# Patient Record
Sex: Female | Born: 1950 | Race: White | Hispanic: No | Marital: Married | Smoking: Former smoker
Health system: Southern US, Community
[De-identification: ages and names within clinical notes are randomized; demographics above are authoritative.]

## PROBLEM LIST (undated history)

## (undated) DIAGNOSIS — K5792 Diverticulitis of intestine, part unspecified, without perforation or abscess without bleeding: Secondary | ICD-10-CM

## (undated) DIAGNOSIS — I499 Cardiac arrhythmia, unspecified: Secondary | ICD-10-CM

## (undated) DIAGNOSIS — E785 Hyperlipidemia, unspecified: Secondary | ICD-10-CM

## (undated) DIAGNOSIS — K219 Gastro-esophageal reflux disease without esophagitis: Secondary | ICD-10-CM

## (undated) DIAGNOSIS — R161 Splenomegaly, not elsewhere classified: Secondary | ICD-10-CM

## (undated) DIAGNOSIS — I4891 Unspecified atrial fibrillation: Secondary | ICD-10-CM

## (undated) DIAGNOSIS — Z8719 Personal history of other diseases of the digestive system: Secondary | ICD-10-CM

## (undated) DIAGNOSIS — J45909 Unspecified asthma, uncomplicated: Secondary | ICD-10-CM

## (undated) DIAGNOSIS — M1990 Unspecified osteoarthritis, unspecified site: Secondary | ICD-10-CM

## (undated) DIAGNOSIS — C9110 Chronic lymphocytic leukemia of B-cell type not having achieved remission: Secondary | ICD-10-CM

## (undated) DIAGNOSIS — I10 Essential (primary) hypertension: Secondary | ICD-10-CM

## (undated) DIAGNOSIS — I480 Paroxysmal atrial fibrillation: Secondary | ICD-10-CM

## (undated) DIAGNOSIS — D649 Anemia, unspecified: Secondary | ICD-10-CM

## (undated) HISTORY — PX: ANKLE SURGERY: SHX546

## (undated) HISTORY — PX: CHOLECYSTECTOMY: SHX55

## (undated) HISTORY — PX: TONSILLECTOMY: SUR1361

## (undated) HISTORY — DX: Splenomegaly, not elsewhere classified: R16.1

## (undated) HISTORY — DX: Hyperlipidemia, unspecified: E78.5

## (undated) HISTORY — DX: Unspecified atrial fibrillation: I48.91

## (undated) HISTORY — DX: Paroxysmal atrial fibrillation: I48.0

## (undated) HISTORY — PX: ROTATOR CUFF REPAIR: SHX139

---

## 2006-10-28 ENCOUNTER — Encounter: Payer: Self-pay | Admitting: Orthopedic Surgery

## 2006-10-29 ENCOUNTER — Encounter: Payer: Self-pay | Admitting: Orthopedic Surgery

## 2006-11-28 ENCOUNTER — Encounter: Payer: Self-pay | Admitting: Orthopedic Surgery

## 2006-12-09 ENCOUNTER — Ambulatory Visit: Payer: Self-pay | Admitting: Pain Medicine

## 2006-12-17 ENCOUNTER — Ambulatory Visit: Payer: Self-pay | Admitting: Pain Medicine

## 2009-07-30 HISTORY — PX: HERNIA REPAIR: SHX51

## 2009-10-07 ENCOUNTER — Inpatient Hospital Stay (HOSPITAL_COMMUNITY): Admission: EM | Admit: 2009-10-07 | Discharge: 2009-10-11 | Payer: Self-pay | Admitting: Emergency Medicine

## 2009-10-07 ENCOUNTER — Ambulatory Visit: Payer: Self-pay | Admitting: Cardiology

## 2009-10-09 ENCOUNTER — Encounter (INDEPENDENT_AMBULATORY_CARE_PROVIDER_SITE_OTHER): Payer: Self-pay | Admitting: *Deleted

## 2009-10-10 ENCOUNTER — Encounter (INDEPENDENT_AMBULATORY_CARE_PROVIDER_SITE_OTHER): Payer: Self-pay | Admitting: *Deleted

## 2009-10-10 ENCOUNTER — Encounter (INDEPENDENT_AMBULATORY_CARE_PROVIDER_SITE_OTHER): Payer: Self-pay | Admitting: Internal Medicine

## 2009-10-10 LAB — CONVERTED CEMR LAB
AST: 21 units/L
Alkaline Phosphatase: 91 units/L
BUN: 10 mg/dL
Calcium: 10.2 mg/dL
Chloride: 112 meq/L
Creatinine, Ser: 0.8 mg/dL
GFR calc non Af Amer: >60 mL/min
Glucose, Bld: 87 mg/dL
Potassium: 4.3 meq/L

## 2009-10-11 ENCOUNTER — Encounter (INDEPENDENT_AMBULATORY_CARE_PROVIDER_SITE_OTHER): Payer: Self-pay | Admitting: *Deleted

## 2009-10-11 LAB — CONVERTED CEMR LAB
HCT: 40.5 %
MCV: 92.1 fL

## 2009-10-17 ENCOUNTER — Ambulatory Visit: Payer: Self-pay | Admitting: Cardiology

## 2009-10-17 ENCOUNTER — Telehealth (INDEPENDENT_AMBULATORY_CARE_PROVIDER_SITE_OTHER): Payer: Self-pay | Admitting: *Deleted

## 2009-10-20 ENCOUNTER — Ambulatory Visit: Payer: Self-pay | Admitting: Cardiology

## 2009-10-20 ENCOUNTER — Telehealth (INDEPENDENT_AMBULATORY_CARE_PROVIDER_SITE_OTHER): Payer: Self-pay | Admitting: *Deleted

## 2009-10-27 ENCOUNTER — Ambulatory Visit: Payer: Self-pay | Admitting: Cardiology

## 2009-10-27 LAB — CONVERTED CEMR LAB: POC INR: 2.2

## 2009-10-31 ENCOUNTER — Encounter: Payer: Self-pay | Admitting: Cardiology

## 2009-11-01 DIAGNOSIS — I10 Essential (primary) hypertension: Secondary | ICD-10-CM | POA: Insufficient documentation

## 2009-11-01 DIAGNOSIS — D7289 Other specified disorders of white blood cells: Secondary | ICD-10-CM | POA: Insufficient documentation

## 2009-11-02 ENCOUNTER — Ambulatory Visit: Payer: Self-pay | Admitting: Cardiology

## 2009-11-02 ENCOUNTER — Encounter (INDEPENDENT_AMBULATORY_CARE_PROVIDER_SITE_OTHER): Payer: Self-pay | Admitting: *Deleted

## 2009-11-09 ENCOUNTER — Ambulatory Visit: Payer: Self-pay | Admitting: Cardiology

## 2009-11-09 ENCOUNTER — Encounter (INDEPENDENT_AMBULATORY_CARE_PROVIDER_SITE_OTHER): Payer: Self-pay | Admitting: *Deleted

## 2009-11-09 DIAGNOSIS — E785 Hyperlipidemia, unspecified: Secondary | ICD-10-CM | POA: Insufficient documentation

## 2009-11-09 DIAGNOSIS — E669 Obesity, unspecified: Secondary | ICD-10-CM | POA: Insufficient documentation

## 2009-11-11 ENCOUNTER — Telehealth (INDEPENDENT_AMBULATORY_CARE_PROVIDER_SITE_OTHER): Payer: Self-pay | Admitting: *Deleted

## 2009-11-14 ENCOUNTER — Telehealth (INDEPENDENT_AMBULATORY_CARE_PROVIDER_SITE_OTHER): Payer: Self-pay | Admitting: *Deleted

## 2009-11-16 ENCOUNTER — Encounter (INDEPENDENT_AMBULATORY_CARE_PROVIDER_SITE_OTHER): Payer: Self-pay | Admitting: *Deleted

## 2009-11-16 ENCOUNTER — Ambulatory Visit: Payer: Self-pay | Admitting: Cardiology

## 2009-11-16 LAB — CONVERTED CEMR LAB: POC INR: 2.2

## 2009-11-23 ENCOUNTER — Ambulatory Visit: Payer: Self-pay | Admitting: Cardiology

## 2009-11-30 ENCOUNTER — Ambulatory Visit: Payer: Self-pay | Admitting: Cardiology

## 2009-11-30 ENCOUNTER — Encounter (INDEPENDENT_AMBULATORY_CARE_PROVIDER_SITE_OTHER): Payer: Self-pay | Admitting: *Deleted

## 2009-11-30 LAB — CONVERTED CEMR LAB
BUN: 18 mg/dL
BUN: 18 mg/dL (ref 6–23)
CO2: 25 meq/L
CO2: 25 meq/L (ref 19–32)
Calcium: 10.5 mg/dL
Chloride: 108 meq/L
Cholesterol: 219 mg/dL
Cholesterol: 219 mg/dL — ABNORMAL HIGH (ref 0–200)
Creatinine, Ser: 0.78 mg/dL
Glucose, Bld: 80 mg/dL
Glucose, Bld: 80 mg/dL (ref 70–99)
HDL: 56 mg/dL
LDL Cholesterol: 149 mg/dL
LDL Cholesterol: 149 mg/dL — ABNORMAL HIGH (ref 0–99)
POC INR: 2.4
Potassium: 4.4 meq/L
Potassium: 4.4 meq/L (ref 3.5–5.3)
Sodium: 143 meq/L
Sodium: 143 meq/L (ref 135–145)
Total CHOL/HDL Ratio: 3.9
Triglycerides: 69 mg/dL

## 2009-12-05 ENCOUNTER — Encounter (INDEPENDENT_AMBULATORY_CARE_PROVIDER_SITE_OTHER): Payer: Self-pay | Admitting: *Deleted

## 2009-12-08 ENCOUNTER — Ambulatory Visit (HOSPITAL_COMMUNITY): Admission: RE | Admit: 2009-12-08 | Discharge: 2009-12-08 | Payer: Self-pay | Admitting: Cardiology

## 2009-12-08 ENCOUNTER — Ambulatory Visit: Payer: Self-pay | Admitting: Cardiology

## 2009-12-15 ENCOUNTER — Encounter (INDEPENDENT_AMBULATORY_CARE_PROVIDER_SITE_OTHER): Payer: Self-pay | Admitting: *Deleted

## 2009-12-19 ENCOUNTER — Ambulatory Visit: Payer: Self-pay | Admitting: Cardiology

## 2010-01-02 ENCOUNTER — Ambulatory Visit: Payer: Self-pay | Admitting: Cardiology

## 2010-01-02 LAB — CONVERTED CEMR LAB: POC INR: 1.7

## 2010-01-09 ENCOUNTER — Encounter: Payer: Self-pay | Admitting: Cardiology

## 2010-01-11 ENCOUNTER — Ambulatory Visit: Payer: Self-pay | Admitting: Cardiology

## 2010-01-25 ENCOUNTER — Ambulatory Visit: Payer: Self-pay | Admitting: Orthopedic Surgery

## 2010-01-25 ENCOUNTER — Ambulatory Visit: Payer: Self-pay | Admitting: Cardiology

## 2010-01-25 DIAGNOSIS — M7580 Other shoulder lesions, unspecified shoulder: Secondary | ICD-10-CM

## 2010-01-25 DIAGNOSIS — M24139 Other articular cartilage disorders, unspecified wrist: Secondary | ICD-10-CM | POA: Insufficient documentation

## 2010-01-25 DIAGNOSIS — M1710 Unilateral primary osteoarthritis, unspecified knee: Secondary | ICD-10-CM | POA: Insufficient documentation

## 2010-01-26 ENCOUNTER — Encounter: Payer: Self-pay | Admitting: Orthopedic Surgery

## 2010-02-02 ENCOUNTER — Telehealth (INDEPENDENT_AMBULATORY_CARE_PROVIDER_SITE_OTHER): Payer: Self-pay | Admitting: *Deleted

## 2010-02-07 ENCOUNTER — Encounter (INDEPENDENT_AMBULATORY_CARE_PROVIDER_SITE_OTHER): Payer: Self-pay | Admitting: *Deleted

## 2010-02-17 ENCOUNTER — Ambulatory Visit: Payer: Self-pay | Admitting: Cardiology

## 2010-03-15 ENCOUNTER — Encounter: Payer: Self-pay | Admitting: Cardiology

## 2010-05-17 ENCOUNTER — Ambulatory Visit: Payer: Self-pay | Admitting: Orthopedic Surgery

## 2010-05-22 ENCOUNTER — Ambulatory Visit (HOSPITAL_COMMUNITY): Admission: RE | Admit: 2010-05-22 | Discharge: 2010-05-22 | Payer: Self-pay | Admitting: General Surgery

## 2010-08-29 NOTE — Progress Notes (Signed)
Summary: HEART RACING WANTS SEEN TODAY  Phone Note Call from Patient Call back at Home Phone [phone]   Caller: PT Reason for Call: Talk to Nurse Summary of Call: PT HEART IS STILL RACING. SHE HAS APPOINTMENT THIS AM WITH COUMADIN CLINIC BUT FEELS SHE NEEDS TO SEE THE DOCTOR. Initial call taken by: Faythe Ghee,  October 17, 2009 8:35 AM  Follow-up for Phone Call        I talked with pt in detail about atral fib and its s/s, printed handouts and gave to pt at her ccr visit at 10:00am today about afib Follow-up by: Teressa Lower RN,  October 17, 2009 9:42 AM

## 2010-08-29 NOTE — Progress Notes (Signed)
  Phone Note Call from Patient   Caller: Patient Summary of Call: pt have palp, having alot of difficulty at night resting d/t feeling these palp, wanted something to stop her palp or help her rest prior to her appt in 11/09/2009, pt states she is not resting at all at night because of this.  plz advise... Initial call taken by: Teressa Lower RN,  October 20, 2009 2:19 PM  Follow-up for Phone Call        Need RN visit for rhythm strip and vital signs.  [place] Bing, M.D.  Follow-up by: Kathlen Brunswick, MD, [facility],  October 20, 2009 10:07 PM  Additional Follow-up for Phone Call Additional follow up Details #1::        scheduled with ccr 10/27/2009 Additional Follow-up by: Teressa Lower RN,  October 21, 2009 10:48 AM

## 2010-08-29 NOTE — Medication Information (Signed)
Summary: ccr-lr  Anticoagulant Therapy  Managed by: Vashti Hey, RN PCP: Rosine Abe Supervising MD: Dietrich Pates MD, Molly Maduro Indication 1: Atrial Fibrillation Lab Used: [facility] [place] Site: [place] INR POC 1.7  Dietary changes: no    Health status changes: no    Bleeding/hemorrhagic complications: no    Recent/future hospitalizations: no    Any changes in medication regimen? no    Recent/future dental: no  Any missed doses?: no       Is patient compliant with meds? yes       Allergies: No Known Drug Allergies  Anticoagulation Management History:      The patient is taking warfarin and comes in today for a routine follow up visit.  The patient is on warfarin for Atrial Fibrillation .  Negative risk factors for bleeding include an age less than 23 years old, no history of CVA/TIA, no history of GI bleeding, and absence of serious comorbidities.  The bleeding index is 'low risk'.  Positive CHADS2 values include History of HTN.  Negative CHADS2 values include History of CHF, Age > 1 years old, History of Diabetes, and Prior Stroke/CVA/TIA.  The start date was 10/07/2009.  Her last INR was 1.33.  Anticoagulation responsible provider: Dietrich Pates MD, Molly Maduro.  INR POC: 1.7.  Cuvette Lot#: [id number].  Exp: 11/2010.    Anticoagulation Management Assessment/Plan:      The patient's current anticoagulation dose is Warfarin sodium 5 mg tabs: Use as directed by Anticoagulation Clinic, Coumadin 5 mg tabs: Sunday - 2 tabs, Monday - 1.5 tabs, Tuesday - 1.5 tabs, Wednesday - 2 tabs, Thursday - 1.5 tabs, Friday - 1.5 tabs, Saturday - 1.5 tabs.  The target INR is 2.0-3.0.  The next INR is due 01/12/2010.  Anticoagulation instructions were given to patient.  Results were reviewed/authorized by Vashti Hey, RN.  She was notified by Vashti Hey RN.         Prior Anticoagulation Instructions: INR 3.0 Continue coumadin 7.5mg  once daily except 10mg  on Sundays and  Wednesdays Increase greens  Current Anticoagulation Instructions: INR 1.7 Take coumadin 3 tablets tonight, 2 1/2 tablets tomorrow night then resume 1 1/2 tablets once daily except 2 tablets on Sundays and Wednesdays

## 2010-08-29 NOTE — Letter (Signed)
Summary: TEE Instructions [place]  [facility]  [address] [place] [address]   Phone: [phone]  Fax: [phone]      TEE Instructions    You are scheduled for a CARIOVERSION on MAY 12,2011 with Dr. Dietrich Pates.  Please arrive at the [facility] of [facility] at 8:30 a.m.  on the day of your procedure.  1)   Diet:     A)   Nothing to eat or drink after midnight except your medications with        a sip of water.     2)  Must have a responsible person to drive you home.  3)   Bring your current insurance cards and current list of all your medications.   *Special Note:  Every effort is made to have your procedure done on time.  Occasionally there are emergencies that present themselves at the hospital that may cause delays.  Please be patient if a delay does occur.  *If you have any questions after you get home, please call the office at [phone].

## 2010-08-29 NOTE — Miscellaneous (Signed)
Summary: LABS BMP,LIPIDS,11/30/2009  Clinical Lists Changes  Observations: Added new observation of CALCIUM: 10.5 mg/dL (21/30/8657 84:69) Added new observation of CREATININE: 0.78 mg/dL (62/95/2841 32:44) Added new observation of BUN: 18 mg/dL (08/01/7251 66:44) Added new observation of BG RANDOM: 80 mg/dL (03/47/4259 56:38) Added new observation of CO2 PLSM/SER: 25 meq/L (11/30/2009 10:21) Added new observation of CL SERUM: 108 meq/L (11/30/2009 10:21) Added new observation of K SERUM: 4.4 meq/L (11/30/2009 10:21) Added new observation of NA: 143 meq/L (11/30/2009 10:21) Added new observation of LDL: 149 mg/dL (75/64/3329 51:88) Added new observation of HDL: 56 mg/dL (41/66/0630 16:01) Added new observation of TRIGLYC TOT: 69 mg/dL (09/32/3557 32:20) Added new observation of CHOLESTEROL: 219 mg/dL (25/42/7062 37:62)

## 2010-08-29 NOTE — Assessment & Plan Note (Signed)
Summary: EPH   Visit Type:  Follow-up Primary Provider:  Rosine Abe   History of Present Illness: [name] returns to the office following a recent hospital admission for new-onset atrial fibrillation.  She had no definite precipitating event, but did have a sinus infection or other upper respiratory infection at the time of presentation.  Heart rate was controlled and anticoagulation initiated in hospital.  The patient has subsequently noted fatigue and malaise.  Exercise tolerance has been poor.  She has exertional dyspnea.  She occasionally notes palpitations with a sense of tachycardia.  Hospital records were obtained and reviewed.  Her echocardiogram was essentially normal, but there was mild septal hypokinesis.  Thyroid function studies suggested mild hypothyroidism.  Chemistry profile, coagulation studies and CBC were normal.  Current Medications (verified): 1)  Warfarin Sodium 5 Mg Tabs (Warfarin Sodium) .... Use As Directed By Anticoagulation Clinic 2)  Diltiazem Hcl Er Beads 360 Mg Xr24h-Cap (Diltiazem Hcl Er Beads) .... Take One Capsule By Mouth Daily Start4/15/2011 3)  Atenolol 25 Mg Tabs (Atenolol) .... Take One Tablet By Mouth Daily Start 11/11/2009 4)  Calcium-Vitamin D 500-125 Mg-Unit Tabs (Calcium-Vitamin D) .Marland Kitchen.. 1 Tab Daily 5)  Glucosamine 500 Mg Caps (Glucosamine Sulfate) .Marland Kitchen.. 1 Tab By Mouth Once Daily 6)  Salmon Oil  Caps (Nutritional Supplements) .Marland Kitchen.. 1 Tab Daily 7)  Nabumetone 750 Mg Tabs (Nabumetone) .Marland Kitchen.. 1 Tab By Mouth Two Times A Day 8)  Tylenol Extra Strength 500 Mg Tabs (Acetaminophen) .... Take 2 Tabs Every 6 Hrs. Prn 9)  Evening Primrose Oil 500 Mg Caps (Evening Primrose Oil) .Marland Kitchen.. 1 Tab Nightly  Allergies (verified): No Known Drug Allergies  Past History:  Family History: Last updated: 12-05-2009 Father died at age 66 as a result of a brainstem infarction Mother died at age 30 secondary to ovarian carcinoma Multiple family members  with hypertension.  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  All other systems reviewed and are negative.  Vital Signs:  Patient profile:   60 year old female Height:      66 inches Weight:      249 pounds BMI:     40.33 Pulse rate:   55 / minute BP sitting:   120 / 65  (right arm)  Vitals Entered By: Dreama Saa, CNA (12-05-09 1:26 PM)  EKG  Procedure date:  12-05-09  Findings:      Rhythm Strip  Atrial fibrillation; controlled ventricular response of 78.   Physical Exam  General:    Obese; well developed; no acute distress:   Neck-No JVD; no carotid bruits:mild thyromegaly Lungs-No tachypnea, no rales; no rhonchi; no wheezes: Cardiovascular-normal PMI; normal S1 and S2;  modest systolic murmur Abdomen-BS normal; soft and non-tender without masses or organomegaly:  Musculoskeletal-No deformities, no cyanosis or clubbing: Neurologic-Normal cranial nerves; symmetric strength and tone:  Skin-Warm, no significant lesions: Extremities-Nl  distal pulses; no edema:     Impression & Recommendations:  Problem # 1:  ATRIAL FIBRILLATION (ICD-427.31) Heart rate is well controlled at rest.  After a 1000 feet of walking on the flat, heart rate increased to only 105 beats per minute, indicating good rate control with effort as well.  The etiology of the patient's fatigue is uncertain.  She's not sleeping well, complaining of palpitations at night.  Beta blocker could certainly be causing some adverse effects.  I will increase her dose of diltiazem to 360 mg q.d. and decrease atenolol to 25 mg q.d.  We will plan for DC cardioversion when INR has been therapeutic for one month.  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  Patient's primary care doctor will follow up this problem and her minor thyromegaly.  Problem # 3:  HYPERTENSION (ICD-401.9) Blood pressure control is good on current medicines.  I will see this nice woman again in one month at which time I anticipate scheduling elective cardioversion.  Other Orders: Future Orders: T-Lipid Profile ([id number]) ... 11/14/2009  Patient Instructions: 1)  Your physician recommends that you schedule a follow-up appointment in: 1 month 2)  Your physician has recommended you make the following change in your medication: TOMORROW: 3)  120MG  DILTIAZEM AND NO ATENOLOL 4)  CHANGE DILTAIZEM TO 360MG  DAILY START FRIDAY 11/11/2009 5)  CHANGE ATENOLOL TO 25MG  DAILY START FRIDAY 11/11/2009 6)  COUMADIN CLINIC WEEKLY X 4 WEEKS UNTIL NEXT OFFICE VISIT 7)  Your physician recommends that you return for a FASTING lipid profile: NEXT WEEK Prescriptions: ATENOLOL 25 MG TABS (ATENOLOL) Take one tablet by mouth daily START 11/11/2009  #30 x 6   Entered by:   Teressa Lower RN   Authorized by:   Kathlen Brunswick, MD, [facility]   Signed by:   Teressa Lower RN on 11/09/2009   Method used:   Electronically to        [id number] (retail)       [address]       [place], [place]  [address]       Ph: [phone]       Fax: [phone]   RxID:   [id number] DILTIAZEM HCL ER BEADS 360 MG XR24H-CAP (DILTIAZEM HCL ER BEADS) Take one capsule by mouth daily START4/15/2011  #30 x 6   Entered by:   Teressa Lower RN   Authorized by:   Kathlen Brunswick, MD, [facility]   Signed by:   Teressa Lower RN on 11/09/2009   Method used:   Electronically to        [id number] (retail)       [address]       [place], [place]  [address]       Ph: [phone]       Fax: [phone]   RxID:   [id number]

## 2010-08-29 NOTE — Assessment & Plan Note (Signed)
Summary: rt shoulder pain needs xr/mc dis/bsf   Vital Signs:  Patient profile:   60 year old female Height:      67 inches Weight:      244 pounds Pulse rate:   72 / minute Resp:     16 per minute  Vitals Entered By: Fuller Canada MD (January 25, 2010 9:28 AM)  Visit Type:  new patient Referring Provider:  self Primary Provider:  Rosine Abe  CC:  right shoulder pain.  History of Present Illness: I saw Daisy Black in the office today for an initial visit.  She is a 60 years old woman with the complaint of:  right shoulder pain.  She also has some RIGHT knee pain previously seen at [facility] and also in [place] on the schedule to have partial versus total knee replacement and developed atrial fibrillation, which required treatment with Coumadin. She subsequently has come on the [facility], health system plan  The RIGHT shoulder and started with atraumatic onset of sharp, dull, throbbing pain, which is worse at night, but is constant.  Came on gradually worse with cold air blowing on her shoulder better with heat.  Pain present for one month.  Ibuprofen. No relief.  Pain level IV/X  Xrays today in our office.  Meds: Atenolol, Coumadin 5mg , Celebrex 200mg , Lisinopril, Synthroid, Salmon oil, Calcium, Glucosamine.    Allergies (verified): No Known Drug Allergies  Past History:  Past Medical History: Atrial fibrillation-onset in 09/2009; DC cardioversion in 10/2009 LYMPHOCYTOSIS (ICD-288.8) Obesity HYPERTENSION (ICD-401.9) Hyperlipidemia Elevated TSH hernia  Past Surgical History: Herniorrhaphy Cholecystectomy Rotator cuff repair Right ankle surgery tonsils  Family History: Father died at age 54 as a result of a brainstem infarction Mother died at age 61 secondary to ovarian carcinoma Multiple family members with hypertension. 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 Cardiovascular:  Complains of palpitations; denies chest pain, fainting, and murmurs. Respiratory:  Denies short of breath, wheezing, couch, tightness, pain on inspiration, and snoring . Gastrointestinal:  Denies heartburn, nausea, vomiting, diarrhea, constipation, and blood in your stools. Genitourinary:  Denies frequency, urgency, difficulty urinating, painful urination, flank pain, and bleeding in urine. Neurologic:  Denies numbness, tingling, unsteady gait, dizziness, tremors, and seizure. Musculoskeletal:  Complains of joint pain, swelling, and stiffness; denies instability, redness, heat, and muscle pain. Endocrine:  Denies excessive thirst, exessive urination, and heat or cold intolerance. Psychiatric:  Denies nervousness, depression, anxiety, and hallucinations. Skin:  Denies changes in the skin, poor healing, rash, itching, and redness. HEENT:  Denies blurred or double vision, eye pain, redness, and watering. Immunology:  Complains of seasonal allergies; denies sinus problems and allergic to bee stings. Hemoatologic:  Complains of brusing; denies easy bleeding.  Physical Exam  Skin:  intact without lesions or rashes Cervical Nodes:  no significant adenopathy Axillary Nodes:  no significant adenopathy Psych:  alert and cooperative; normal mood and affect; normal attention span and concentration   Shoulder/Elbow Exam  General:    vital signs are as recorded.  Body habitus, endomorphic.  No gross deformities.    Inspection:    normal appearing shoulder  Palpation:    no specific tenderness  maybe a little in the subacromial space posteriorly  Vascular:    radial and  ulnar pulses are normal in both upper extremities.  Color is good temperature, normal.    Sensory:    upper extremities. No sensory deficits  Motor:    rotator cuff strength is normal.    Reflexes:    Normal reflexes in the upper extremities.    Shoulder Exam:    Right:    Inspection/Palpation:  range of motion passively is normal.  She does have a mild impingement sign with Neer maneuver shoulder stable in abduction. External rotation apprehension sign was negative.     Impression & Recommendations:  Problem # 1:  IMPINGEMENT SYNDROME (ICD-726.2) Assessment New  RIGHT shoulder subacromial space injection.  RIGHT shoulder x-rays, AP, lateral, show normal glenohumeral joint, normal acromion, but no bony abnormality.  Assessment most likely impingement syndrome with bursitis. 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  Notes reviewed from [facility] orthopedics and from [facility] confirm that the patient has had cortisone injection and Synvisc injections and was scheduled for knee replacement surgery.   Her updated medication list for this problem includes:    Nabumetone 750 Mg Tabs (Nabumetone) .Marland Kitchen... 1 tab by mouth two times a day    Tramadol Hcl 50 Mg Tabs (Tramadol hcl) .Marland Kitchen... Take 1 tab daily    Tylenol Arthritis Pain 650 Mg Cr-tabs (Acetaminophen) .Marland Kitchen... Take as directed for pain    Tylenol With Codeine #3 300-30 Mg Tabs (Acetaminophen-codeine) ..... One by mouth q 4 hrs as needed pain  Orders: New Patient Level IV (46962) Depo- Medrol 40mg  (J1030) Joint Aspirate / Injection, Intermediate (95284)  Medications Added to Medication List This Visit: 1)  Tylenol  With Codeine #3 300-30 Mg Tabs (Acetaminophen-codeine) .... One by mouth q 4 hrs as needed pain  Patient Instructions: 1)  You have received an injection of cortisone today. You may experience increased pain at the injection site. Apply ice pack to the area for 20 minutes every 2 hours and take 2 xtra strength tylenol every 8 hours. This increased pain will usually resolve in 24 hours. The injection will take effect in 3-10 days.  2)  come back as needed Prescriptions: TYLENOL WITH CODEINE #3 300-30 MG TABS (ACETAMINOPHEN-CODEINE) one by mouth q 4 hrs as needed pain  #60 x 0   Entered and Authorized by:   Fuller Canada MD   Signed by:   Fuller Canada MD on 01/25/2010   Method used:   Historical   RxID:   [id number]

## 2010-08-29 NOTE — Medication Information (Signed)
Summary: ccr-lr  Anticoagulant Therapy  Managed by: Vashti Hey, RN Supervising MD: Dietrich Pates MD, Molly Maduro Indication 1: Atrial Fibrillation Lab Used: [facility] [place] Site: [place] INR POC 2.2  Dietary changes: no    Health status changes: no    Bleeding/hemorrhagic complications: no    Recent/future hospitalizations: no    Any changes in medication regimen? no    Recent/future dental: no  Any missed doses?: no       Is patient compliant with meds? yes       Anticoagulation Management History:      The patient is taking warfarin and comes in today for a routine follow up visit.  The patient is taking warfarin for Atrial Fibrillation .  Negative risk factors for bleeding include an age less than 30 years old, no history of CVA/TIA, no history of GI bleeding, and absence of serious comorbidities.  The bleeding index is 'low risk'.  Positive CHADS2 values include History of HTN.  Negative CHADS2 values include History of CHF, Age > 47 years old, History of Diabetes, and Prior Stroke/CVA/TIA.  The start date was 10/07/2009.  Anticoagulation responsible provider: Dietrich Pates MD, Molly Maduro.  INR POC: 2.2.  Cuvette Lot#: [id number].    Anticoagulation Management Assessment/Plan:      The patient's current anticoagulation dose is Warfarin sodium 5 mg tabs: Use as directed by Anticoagulation Clinic.  The target INR is 2.0-3.0.  The next INR is due 11/02/2009.  Anticoagulation instructions were given to patient.  Results were reviewed/authorized by Vashti Hey, RN.  She was notified by Vashti Hey RN.         Prior Anticoagulation Instructions: INR 3.5 Hold coumadin tonight then decrease dose to 7.5mg  once daily except 5mg  on Mondays, Wednesdays and Fridays  Current Anticoagulation Instructions: INR 2.2 Continue coumadin 7.5mg  once daily except 5mg  on Mondays, Wednesdays and Fridays

## 2010-08-29 NOTE — Medication Information (Signed)
Summary: ccr-lr  Anticoagulant Therapy  Managed by: Vashti Hey, RN PCP: Rosine Abe Supervising MD: Dietrich Pates MD, Molly Maduro Indication 1: Atrial Fibrillation Lab Used: [facility] [place] Site: [place] INR POC 2.4  Dietary changes: no    Health status changes: no    Bleeding/hemorrhagic complications: no    Recent/future hospitalizations: yes       Details: Pending DCCV  Any changes in medication regimen? no    Recent/future dental: no  Any missed doses?: no       Is patient compliant with meds? yes       Allergies: No Known Drug Allergies  Anticoagulation Management History:      The patient is taking warfarin and comes in today for a routine follow up visit.  The patient is taking warfarin for Atrial Fibrillation .  Negative risk factors for bleeding include an age less than 61 years old, no history of CVA/TIA, no history of GI bleeding, and absence of serious comorbidities.  The bleeding index is 'low risk'.  Positive CHADS2 values include History of HTN.  Negative CHADS2 values include History of CHF, Age > 32 years old, History of Diabetes, and Prior Stroke/CVA/TIA.  The start date was 10/07/2009.  Her last INR was 1.33.  Anticoagulation responsible provider: Dietrich Pates MD, Molly Maduro.  INR POC: 2.4.    Anticoagulation Management Assessment/Plan:      The patient's current anticoagulation dose is Warfarin sodium 5 mg tabs: Use as directed by Anticoagulation Clinic.  The target INR is 2.0-3.0.  The next INR is due 11/30/2009.  Anticoagulation instructions were given to patient.  Results were reviewed/authorized by Vashti Hey, RN.  She was notified by Vashti Hey RN.        Coagulation management information includes: Pending DCCV.  Prior Anticoagulation Instructions: INR 2.2 Take coumadin 7.5mg  once daily except 10mg  on Wednesdays and Sundays  Current Anticoagulation Instructions: INR 2.4 Continue coumadin 7.5mg  once daily except 10mg  on Wednesdays  and Sundays

## 2010-08-29 NOTE — Assessment & Plan Note (Signed)
Summary: F1M   Visit Type:  Follow-up Primary Provider:  Rosine Abe   History of Present Illness: [name] returns to the office for continued assessment and treatment of paroxysmal atrial fibrillation.  Since her last visit, she underwent uncomplicated DC cardioversion as an outpatient.  She reports a much improved sense of well-being since that procedure.  She has a ventral hernia and would like to undergo surgical repair as soon as possible.  She previously was seen by an expert from [facility], but her insurance no longer covers that institution.  She is now seeking a local surgeon-I provided her with names of goodl practitioners in the area.  Current Medications (verified): 1)  Warfarin Sodium 5 Mg Tabs (Warfarin Sodium) .... Use As Directed By Anticoagulation Clinic 2)  Atenolol 25 Mg Tabs (Atenolol) .... Take One Tablet By Mouth Daily Start 11/11/2009 3)  Calcium-Vitamin D 500-125 Mg-Unit Tabs (Calcium-Vitamin D) .Marland Kitchen.. 1 Tab Daily 4)  Glucosamine 500 Mg Caps (Glucosamine Sulfate) .Marland Kitchen.. 1 Tab By Mouth Once Daily 5)  Salmon Oil  Caps (Nutritional Supplements) .Marland Kitchen.. 1 Tab Daily 6)  Nabumetone 750 Mg Tabs (Nabumetone) .Marland Kitchen.. 1 Tab By Mouth Two Times A Day 7)  Evening Primrose Oil 500 Mg Caps (Evening Primrose Oil) .Marland Kitchen.. 1 Tab Nightly 8)  Coumadin 5 Mg Tabs (Warfarin Sodium) .... "Sunday - 2 Tabs, Monday - 1.5 Tabs, Tuesday - 1.5 Tabs, Wednesday - 2 Tabs, Thursday - 1.5 Tabs, Friday - 1.5 Tabs, Saturday - 1.5 Tabs 9)  Levothyroxine Sodium 100 Mcg Tabs (Levothyroxine Sodium) .... Take 1 Tab Daily 10)  Tramadol Hcl 50 Mg Tabs (Tramadol Hcl) .... Take 1 Tab Daily 11)  Tylenol Arthritis Pain 650 Mg Cr-Tabs (Acetaminophen) .... Take As Directed For Pain 12)  Lisinopril 20 Mg Tabs (Lisinopril) .... Take 1 Tablet By Mouth Once A Day '  Allergies (verified): No Known Drug Allergies  Past History:  PMH, FH, and Social History reviewed and updated.  Past Medical  History: Atrial fibrillation-onset in 09/2009; DC cardioversion in 10/2009 LYMPHOCYTOSIS (ICD-288.8) Obesity HYPERTENSION (ICD-401.9) Hyperlipidemia Elevated TSH  Review of Systems  The patient denies weight loss, weight gain, chest pain, syncope, dyspnea on exertion, and peripheral edema.    Vital Signs:  Patient profile:   60 year old female Weight:      244 pounds BMI:     39" .52 Pulse rate:   58 / minute BP sitting:   118 / 64  (right arm)  Vitals Entered By: Dreama Saa, CNA (Dec 19, 2009 1:51 PM)  Physical Exam  General:     well developed; no acute distress:   Neck-No JVD; no carotid bruits Lungs-No tachypnea, no rales; no rhonchi; no wheezes: Cardiovascular-normal PMI; normal S1 and S2; modest systolic murmur Abdomen-BS normal; soft and non-tender without masses or organomegaly:  Musculoskeletal-No deformities, no cyanosis or clubbing: Neurologic-Normal cranial nerves; symmetric strength and tone:  Skin-Warm, no significant lesions: Extremities-Nl distal pulses; no edema:     Impression & Recommendations:  Problem # 1:  ATRIAL FIBRILLATION (ICD-427.31) Sinus rhythm and has been maintained during the few weeks since she underwent elective cardioversion.  Since an ACE inhibitor may contribute to maintenance of sinus rhythm, lisinopril will be substituted for diltiazem at a dose of 20 mg q.d.  I am also inclined to stop diltiazem because it has led to a significant first degree AV block on her EKG.  She will resume that medication should she experience recurrent AF.  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 has been started by the patient's primary care physician.  A repeat thyroid panel should be obtained in approximately 6 weeks.  Problem # 3:  HYPERTENSION (ICD-401.9) Blood pressure control is good.  Optimal management of this problem likely reduces the possibility of a recurrent arrhythmia.  With lisinopril  does not adequately reduce pressure, an additional antihypertensive agent will be necessary.  Problem # 4:  ANTICOAGULATION (ICD-V58.61) Risk factors for thromboembolism  include only borderline hypertension.  In the long run the risk of warfarin therapy probably exceeds the benefit.  If sinus rhythm is maintained for the next 2 months, I will be inclined to substitute aspirin for full dose warfarin.  I will see this nice woman again in July.  Patient Instructions: 1)  Your physician recommends that you schedule a follow-up appointment in: 2 months 2)  You have been referred to Dr. Lovell Sheehan for surgical consult 3)  Your physician has recommended you make the following change in your medication: stop diltiazem and begin lisinopril 20mg  daily 4)  if atrial fibrillation restarts diltiazem and stop lisinopril Prescriptions: LISINOPRIL 20 MG TABS (LISINOPRIL) Take 1 tablet by mouth once a day '  #30 x 3   Entered by:   Teressa Lower RN   Authorized by:   Kathlen Brunswick, MD, [facility]   Signed by:   Teressa Lower RN on 12/19/2009   Method used:   Electronically to        [id number] (retail)       [address]       [place], [place]  [address]       Ph: [phone]       Fax: [phone]   RxID:   [id number]

## 2010-08-29 NOTE — Letter (Signed)
Summary: Previous records brought by the patient  Previous records brought by the patient   Imported By: Jacklynn Ganong 01/26/2010 09:37:54  _____________________________________________________________________  External Attachment:    Type:   Image     Comment:   External Document

## 2010-08-29 NOTE — Medication Information (Signed)
Summary: ccr-lr  Anticoagulant Therapy  Managed by: Vashti Hey, RN Supervising MD: Diona Browner MD, Remi Deter Indication 1: Atrial Fibrillation Lab Used: [facility] [place] Site: [place] INR POC 1.6  Dietary changes: no    Health status changes: no    Bleeding/hemorrhagic complications: no    Recent/future hospitalizations: no    Any changes in medication regimen? no    Recent/future dental: no  Any missed doses?: no       Is patient compliant with meds? yes       Allergies: No Known Drug Allergies  Anticoagulation Management History:      The patient is taking warfarin and comes in today for a routine follow up visit.  The patient is taking warfarin for Atrial Fibrillation .  Negative risk factors for bleeding include an age less than 74 years old, no history of CVA/TIA, no history of GI bleeding, and absence of serious comorbidities.  The bleeding index is 'low risk'.  Positive CHADS2 values include History of HTN.  Negative CHADS2 values include History of CHF, Age > 23 years old, History of Diabetes, and Prior Stroke/CVA/TIA.  The start date was 10/07/2009.  Her last INR was 1.33.  Anticoagulation responsible provider: Diona Browner MD, Remi Deter.  INR POC: 1.6.  Cuvette Lot#: [id number].    Anticoagulation Management Assessment/Plan:      The patient's current anticoagulation dose is Warfarin sodium 5 mg tabs: Use as directed by Anticoagulation Clinic.  The target INR is 2.0-3.0.  The next INR is due 11/09/2009.  Anticoagulation instructions were given to patient.  Results were reviewed/authorized by Vashti Hey, RN.  She was notified by Vashti Hey RN.         Prior Anticoagulation Instructions: INR 2.2 Continue coumadin 7.5mg  once daily except 5mg  on Mondays, Wednesdays and Fridays  Current Anticoagulation Instructions: INR 1.6 Take coumadin 2 tablets tonight then increase dose to 1 1/2 tablets once daily

## 2010-08-29 NOTE — Medication Information (Signed)
Summary: ccr  Anticoagulant Therapy  Managed by: Vashti Hey, RN Supervising MD: Diona Browner MD, Remi Deter Indication 1: Atrial Fibrillation Lab Used: [facility] [place] Site: [place]  Dietary changes: no    Health status changes: no    Bleeding/hemorrhagic complications: no    Recent/future hospitalizations: no    Any changes in medication regimen? no    Recent/future dental: no  Any missed doses?: no       Is patient compliant with meds? yes       Anticoagulation Management History:      The patient is taking warfarin and comes in today for a routine follow up visit.  The patient is taking warfarin for Atrial Fibrillation .  Negative risk factors for bleeding include an age less than 11 years old, no history of CVA/TIA, no history of GI bleeding, and absence of serious comorbidities.  The bleeding index is 'low risk'.  Positive CHADS2 values include History of HTN.  Negative CHADS2 values include History of CHF, Age > 72 years old, History of Diabetes, and Prior Stroke/CVA/TIA.  The start date was 10/07/2009.  Anticoagulation responsible provider: Diona Browner MD, Remi Deter.  Cuvette Lot#: [id number].    Anticoagulation Management Assessment/Plan:      The patient's current anticoagulation dose is Warfarin sodium 5 mg tabs: Use as directed by Anticoagulation Clinic.  The target INR is 2.0-3.0.  The next INR is due 10/27/2009.  Anticoagulation instructions were given to patient.  Results were reviewed/authorized by Vashti Hey, RN.  She was notified by Vashti Hey RN.         Prior Anticoagulation Instructions: INR 2.8 Decrease coumadin to 7.5mg  once daily   Current Anticoagulation Instructions: INR 3.5 Hold coumadin tonight then decrease dose to 7.5mg  once daily except 5mg  on Mondays, Wednesdays and Fridays

## 2010-08-29 NOTE — Medication Information (Signed)
Summary: ccr-lr  Anticoagulant Therapy  Managed by: Vashti Hey, RN Supervising MD: Dietrich Pates MD, Molly Maduro Indication 1: Atrial Fibrillation Lab Used: [facility] [place] Site: [place] INR POC 2.2  Dietary changes: no    Health status changes: no    Bleeding/hemorrhagic complications: no    Recent/future hospitalizations: no    Any changes in medication regimen? no    Recent/future dental: no  Any missed doses?: no       Is patient compliant with meds? yes       Allergies: No Known Drug Allergies  Anticoagulation Management History:      The patient is taking warfarin and comes in today for a routine follow up visit.  The patient is taking warfarin for Atrial Fibrillation .  Negative risk factors for bleeding include an age less than 75 years old, no history of CVA/TIA, no history of GI bleeding, and absence of serious comorbidities.  The bleeding index is 'low risk'.  Positive CHADS2 values include History of HTN.  Negative CHADS2 values include History of CHF, Age > 13 years old, History of Diabetes, and Prior Stroke/CVA/TIA.  The start date was 10/07/2009.  Her last INR was 1.33.  Anticoagulation responsible provider: Dietrich Pates MD, Molly Maduro.  INR POC: 2.2.  Cuvette Lot#: [id number].    Anticoagulation Management Assessment/Plan:      The patient's current anticoagulation dose is Warfarin sodium 5 mg tabs: Use as directed by Anticoagulation Clinic.  The target INR is 2.0-3.0.  The next INR is due 11/23/2009.  Anticoagulation instructions were given to patient.  Results were reviewed/authorized by Vashti Hey, RN.  She was notified by Vashti Hey RN.         Prior Anticoagulation Instructions: INR 1.6 Take coumadin 2 tablets tonight then increase dose to 1 1/2 tablets once daily   Current Anticoagulation Instructions: INR 2.2 Continue coumadin 7.5mg  once daily

## 2010-08-29 NOTE — Progress Notes (Signed)
Summary: CRAMPS IN LEGS  Phone Note Call from Patient Call back at Home Phone [phone]   Caller: pt Reason for Call: Talk to Nurse Summary of Call: PT HAS BEEN HAVING CRAMPING IN HER LEGS AND WOULD LIKE TO KNOW IF HER MEDS COULD BE CAUSING IT. Initial call taken by: Faythe Ghee,  February 02, 2010 11:58 AM  Follow-up for Phone Call        No medicaitons that would indicate cramping, pt states she has been outside in the heat more... asked her to drink plenty of fluids and if she is still having problems next week at her ov we will do labwork Follow-up by: Teressa Lower RN,  February 02, 2010 2:28 PM

## 2010-08-29 NOTE — Letter (Signed)
Summary: History form  History form   Imported By: Jacklynn Ganong 01/26/2010 09:29:23  _____________________________________________________________________  External Attachment:    Type:   Image     Comment:   External Document

## 2010-08-29 NOTE — Medication Information (Signed)
Summary: post cardioversion protime/tg  Anticoagulant Therapy  Managed by: Vashti Hey, RN PCP: Rosine Abe Supervising MD: Dietrich Pates MD, Molly Maduro Indication 1: Atrial Fibrillation Lab Used: [facility] [place] Site: [place] INR POC 3.0  Dietary changes: no    Health status changes: no    Bleeding/hemorrhagic complications: no    Recent/future hospitalizations: yes       Details: S/P DCCV 12/08/09  Any changes in medication regimen? yes       Details: has been taking more pain med  Recent/future dental: no  Any missed doses?: no       Is patient compliant with meds? yes       Allergies: No Known Drug Allergies  Anticoagulation Management History:      The patient is on warfarin for Atrial Fibrillation .  Negative risk factors for bleeding include an age less than 39 years old, no history of CVA/TIA, no history of GI bleeding, and absence of serious comorbidities.  The bleeding index is 'low risk'.  Positive CHADS2 values include History of HTN.  Negative CHADS2 values include History of CHF, Age > 32 years old, History of Diabetes, and Prior Stroke/CVA/TIA.  The start date was 10/07/2009.  Her last INR was 1.33.  Anticoagulation responsible provider: Dietrich Pates MD, Molly Maduro.  INR POC: 3.0.  Exp: 11/2010.    Anticoagulation Management Assessment/Plan:      The patient's current anticoagulation dose is Warfarin sodium 5 mg tabs: Use as directed by Anticoagulation Clinic, Coumadin 5 mg tabs: Sunday - 2 tabs, Monday - 1.5 tabs, Tuesday - 1.5 tabs, Wednesday - 2 tabs, Thursday - 1.5 tabs, Friday - 1.5 tabs, Saturday - 1.5 tabs.  The target INR is 2.0-3.0.  The next INR is due 12/28/2009.  Anticoagulation instructions were given to patient.  Results were reviewed/authorized by Vashti Hey, RN.  She was notified by Vashti Hey RN.        Coagulation management information includes: S/P DCCV 12/08/09.  Prior Anticoagulation Instructions: INR 2.4 No change in current  warfarin dosing  Current Anticoagulation Instructions: INR 3.0 Continue coumadin 7.5mg  once daily except 10mg  on Sundays and Wednesdays Increase greens

## 2010-08-29 NOTE — Progress Notes (Signed)
Summary: med reactions  Phone Note Call from Patient Call back at Home Phone [phone]   Caller: pt Reason for Call: Talk to Nurse Summary of Call: per pt we increased her dose of diltiazem and it is making her nauseaous no other symptoms Initial call taken by: Faythe Ghee,  November 11, 2009 1:44 PM  Follow-up for Phone Call        asked pt to take medication wtih food over the weekend and call me back on Monday with results. Follow-up by: Teressa Lower RN,  November 11, 2009 3:16 PM  Additional Follow-up for Phone Call Additional follow up Details #1::        no further gi problems Additional Follow-up by: Teressa Lower RN,  November 15, 2009 9:45 AM

## 2010-08-29 NOTE — Letter (Signed)
Summary: [facility], [facility] at [employer]  [address] [place] [address]   Phone: [phone]  Fax: [phone]     November 09, 2009 MRN: [id number]   Daisy Black [address] [place], [place]  [address]      YOUR LAB WORK IS DUE  MONDAY _________________________________________  Please go to [facility], located across the street from [facility] on the second floor.  Hours are Monday - Friday 7am until 7:30pm         Saturday 8am until 12noon    _X_  DO NOT EAT OR DRINK AFTER MIDNIGHT EVENING PRIOR TO LABWORK  __ YOUR LABWORK IS NOT FASTING --YOU MAY EAT PRIOR TO LABWORK

## 2010-08-29 NOTE — Assessment & Plan Note (Signed)
Summary: 2 mth f/u per checkout on 12/19/09/needs INR checktg   Visit Type:  Follow-up Referring Provider:  . Primary Provider:  Rosine Abe  CC:  .Marland Kitchen  History of Present Illness: [name] returns to the office as scheduled for continuing assessment and treatment of paroxysmal atrial fibrillation.  Since her last visit, she has done perfectly from a cardiovascular standpoint.  She has experienced only very brief and very rare palpitations, none lasting more than a few seconds.  She denies chest discomfort, dyspnea, weakness, lightheadedness, fatigue or syncope.  Preventive Screening-Counseling & Management  Alcohol-Tobacco     Smoking Status: quit  Comments: quit over 40 years ago  Current Medications (verified): 1)  Atenolol 25 Mg Tabs (Atenolol) .... Take One Tablet By Mouth Daily Start 11/11/2009 2)  Calcium-Vitamin D 500-125 Mg-Unit Tabs (Calcium-Vitamin D) .Marland Kitchen.. 1 Tab Daily 3)  Glucosamine 500 Mg Caps (Glucosamine Sulfate) .Marland Kitchen.. 1 Tab By Mouth Once Daily 4)  Salmon Oil  Caps (Nutritional Supplements) .Marland Kitchen.. 1 Tab Daily 5)  Nabumetone 750 Mg Tabs (Nabumetone) .Marland Kitchen.. 1 Tab By Mouth Two Times A Day 6)  Evening Primrose Oil 500 Mg Caps (Evening Primrose Oil) .Marland Kitchen.. 1 Tab Nightly 7)  Levothyroxine Sodium 100 Mcg Tabs (Levothyroxine Sodium) .... Take 1 Tab Daily 8)  Tramadol Hcl 50 Mg Tabs (Tramadol Hcl) .... Take 1 Tab Daily 9)  Tylenol Arthritis Pain 650 Mg Cr-Tabs (Acetaminophen) .... Take As Directed For Pain 10)  Lisinopril 20 Mg Tabs (Lisinopril) .... Take 1 Tablet By Mouth Once A Day ' 11)  Aspirin 81 Mg Tbec (Aspirin) .... Take One Tablet By Mouth Daily  Allergies (verified): No Known Drug Allergies  Past History:  PMH, FH, and Social History reviewed and updated.  Social History: Smoking Status:  quit  Review of Systems       See history of present illness.  Vital Signs:  Patient profile:   60 year old female Height:      67 inches Weight:       241 pounds O2 Sat:      95 % on Room air Pulse rate:   82 / minute BP sitting:   108 / 67  (left arm)  Vitals Entered By: Youlanda Mighty RN (February 17, 2010 3:03 PM)  O2 Flow:  Room air CC: .   Physical Exam  General:  Overweight; well developed; no acute distress:   Neck-No JVD; no carotid bruits: Lungs-No tachypnea, no rales; no rhonchi; no wheezes: Cardiovascular-normal PMI; normal S1 and S2; modest systolic ejection murmur Abdomen-BS normal; soft and non-tender without masses or organomegaly:  Musculoskeletal-No deformities, no cyanosis or clubbing: Neurologic-Normal cranial nerves; symmetric strength and tone:  Skin-Warm, no significant lesions: Extremities-Nl distal pulses; no edema:     Impression & Recommendations:  Problem # 1:  ATRIAL FIBRILLATION (ICD-427.31) Patient has had no apparent recurrence of arrhythmia in the 4 months since cardioversion.   Continued observation is appropriate.  Problem # 2:  HYPERTENSION (ICD-401.9) Blood pressure control is good on a single medication indicating that hypertension was very mild to begin with.  Problem # 3:  HYPERLIPIDEMIA (ICD-272.4)  Lipid profile 2 months ago was suboptimal with total cholesterol 219, triglycerides 69, HDL 56 and LDL of 67.   There is no requirement, based upon current guidelines, for pharmacologic therapy.  Dietary therapy, use of oat bran, and use of a sterol margarine would be a reasonable approach initially.  I will plan to see this nice woman again in  8 months.  She will call should she develop palpitations, lightheadedness, syncope, weakness or a documented recurrence of her arrhythmia.  Patient Instructions: 1)  Your physician recommends that you schedule a follow-up appointment in: 8 months 2)  Your physician has recommended you make the following change in your medication: stop coumadin, aspirin 81mg  daily 3)  You have been referred to Ear nose and throat doctor- name and numbers provided  for doctors in [place], [place] and [place]

## 2010-08-29 NOTE — Medication Information (Signed)
Summary: [facility]  Anticoagulant Therapy  Managed by: Teressa Lower, RN PCP: Rosine Abe Supervising MD: Dietrich Pates MD, Molly Maduro Indication 1: Atrial Fibrillation Lab Used: [facility] [place] Site: [place] INR POC 2.4  Dietary changes: no    Health status changes: no    Bleeding/hemorrhagic complications: no    Recent/future hospitalizations: no    Any changes in medication regimen? no    Recent/future dental: no  Any missed doses?: no       Is patient compliant with meds? yes      Comments: INR therapeutic x 4 weeks , cardioversion scheduled for 12/08/2009 @ 9:30am  Current Medications (verified): 1)  Warfarin Sodium 5 Mg Tabs (Warfarin Sodium) .... Use As Directed By Anticoagulation Clinic 2)  Diltiazem Hcl Er Beads 360 Mg Xr24h-Cap (Diltiazem Hcl Er Beads) .... Take One Capsule By Mouth Daily Start4/15/2011 3)  Atenolol 25 Mg Tabs (Atenolol) .... Take One Tablet By Mouth Daily Start 11/11/2009 4)  Calcium-Vitamin D 500-125 Mg-Unit Tabs (Calcium-Vitamin D) .Marland Kitchen.. 1 Tab Daily 5)  Glucosamine 500 Mg Caps (Glucosamine Sulfate) .Marland Kitchen.. 1 Tab By Mouth Once Daily 6)  Salmon Oil  Caps (Nutritional Supplements) .Marland Kitchen.. 1 Tab Daily 7)  Nabumetone 750 Mg Tabs (Nabumetone) .Marland Kitchen.. 1 Tab By Mouth Two Times A Day 8)  Tylenol Extra Strength 500 Mg Tabs (Acetaminophen) .... Take 2 Tabs Every 6 Hrs. Prn 9)  Evening Primrose Oil 500 Mg Caps (Evening Primrose Oil) .Marland Kitchen.. 1 Tab Nightly 10)  Coumadin 5 Mg Tabs (Warfarin Sodium) .... Sunday - 2 Tabs, Monday - 1.5 Tabs, Tuesday - 1.5 Tabs, Wednesday - 2 Tabs, Thursday - 1.5 Tabs, Friday - 1.5 Tabs, Saturday - 1.5 Tabs  Allergies (verified): No Known Drug Allergies   Anticoagulation Management History:      The patient is on warfarin for Atrial Fibrillation .  Negative risk factors for bleeding include an age less than 27 years old, no history of CVA/TIA, no history of GI bleeding, and absence of serious comorbidities.  The  bleeding index is 'low risk'.  Positive CHADS2 values include History of HTN.  Negative CHADS2 values include History of CHF, Age > 43 years old, History of Diabetes, and Prior Stroke/CVA/TIA.  The start date was 10/07/2009.  Her last INR was 1.33.  Anticoagulation responsible provider: Dietrich Pates MD, Molly Maduro.  INR POC: 2.4.  Cuvette Lot#: [id number].  Exp: 11/2010.    Anticoagulation Management Assessment/Plan:      The patient's current anticoagulation dose is Warfarin sodium 5 mg tabs: Use as directed by Anticoagulation Clinic, Coumadin 5 mg tabs: Sunday - 2 tabs, Monday - 1.5 tabs, Tuesday - 1.5 tabs, Wednesday - 2 tabs, Thursday - 1.5 tabs, Friday - 1.5 tabs, Saturday - 1.5 tabs.  The target INR is 2.0-3.0.  The next INR is due 11/30/2009.  Anticoagulation instructions were given to patient.  Results were reviewed/authorized by Teressa Lower, RN.         Prior Anticoagulation Instructions: INR 2.4 Continue coumadin 7.5mg  once daily except 10mg  on Wednesdays and Sundays  Current Anticoagulation Instructions: INR 2.4 No change in current warfarin dosing

## 2010-08-29 NOTE — Medication Information (Signed)
Summary: ccr-lr  Anticoagulant Therapy  Managed by: Vashti Hey, RN PCP: Rosine Abe Supervising MD: Dietrich Pates MD, Molly Maduro Indication 1: Atrial Fibrillation Lab Used: [facility] [place] Site: [place] INR POC 2.6  Dietary changes: no    Health status changes: no    Bleeding/hemorrhagic complications: no    Recent/future hospitalizations: no    Any changes in medication regimen? no    Recent/future dental: no  Any missed doses?: yes     Details: Missed 1/2 dose Sunday  Is patient compliant with meds? yes       Allergies: No Known Drug Allergies  Anticoagulation Management History:      The patient is taking warfarin and comes in today for a routine follow up visit.  The patient is on warfarin for Atrial Fibrillation .  Negative risk factors for bleeding include an age less than 7 years old, no history of CVA/TIA, no history of GI bleeding, and absence of serious comorbidities.  The bleeding index is 'low risk'.  Positive CHADS2 values include History of HTN.  Negative CHADS2 values include History of CHF, Age > 27 years old, History of Diabetes, and Prior Stroke/CVA/TIA.  The start date was 10/07/2009.  Her last INR was 1.33.  Anticoagulation responsible provider: Dietrich Pates MD, Molly Maduro.  INR POC: 2.6.  Cuvette Lot#: [id number].  Exp: 11/2010.    Anticoagulation Management Assessment/Plan:      The patient's current anticoagulation dose is Warfarin sodium 5 mg tabs: Use as directed by Anticoagulation Clinic, Coumadin 5 mg tabs: Sunday - 2 tabs, Monday - 1.5 tabs, Tuesday - 1.5 tabs, Wednesday - 2 tabs, Thursday - 1.5 tabs, Friday - 1.5 tabs, Saturday - 1.5 tabs.  The target INR is 2.0-3.0.  The next INR is due 02/13/2010.  Anticoagulation instructions were given to patient.  Results were reviewed/authorized by Vashti Hey, RN.  She was notified by Vashti Hey RN.         Prior Anticoagulation Instructions: INR 1.8 Increase coumadin to 10mg  once daily except  7.5mg  on Sundays, Tuesdays and Thursdays  Current Anticoagulation Instructions: INR 2.6 Continue coumadin 10mg  once daily except 7.5mg  on Sundays, Tuesdays and Thursdays

## 2010-08-29 NOTE — Miscellaneous (Signed)
Summary: LABS T4,T3,TSH,10/09/2009  Clinical Lists Changes  Observations: Added new observation of TSH: 17.222 microintl units/mL (10/09/2009 9:22) Added new observation of T4, FREE: 1.03 ng/dL (16/04/9603 5:40) Added new observation of T3 FREE: 2.5 pg/mL (10/09/2009 9:22)

## 2010-08-29 NOTE — Progress Notes (Signed)
Summary: Medication side effects  Phone Note Call from Patient   Caller: Patient Summary of Call: pt wants to speak with nurse regarding medication side effects/states that Diltiazem 360mg  is making her lips and up under eyes and jaw feel like they are "tingling"/tg Initial call taken by: Raechel Ache [facility],  November 14, 2009 12:55 PM  Follow-up for Phone Call        I look up side effects of this medication and facial tingling was not a side effect and  I suggest pt speak with her pcp regarding these side effects Follow-up by: Teressa Lower RN,  November 15, 2009 9:49 AM

## 2010-08-29 NOTE — Medication Information (Signed)
Summary: CCN  Anticoagulant Therapy  Managed by: Vashti Hey, RN Supervising MD: Dietrich Pates MD, Molly Maduro Indication 1: Atrial Fibrillation Lab Used: [facility] [place] Site: [place] INR POC 2.8  Dietary changes: no    Health status changes: no    Bleeding/hemorrhagic complications: no    Recent/future hospitalizations: yes       Details: In [facility] 10/07/09 - 10/11/09 for New onset Atrial Fib  Any changes in medication regimen? yes       Details: started on cardizem and Coumadin  Discharged home on coumadin 10mg  qd   Recent/future dental: no  Any missed doses?: no       Is patient compliant with meds? yes      Comments: INR at discharge on 10/11/09 was 1.74.   Coumadin teaching performed with pt.  Discussed indications,adverse effectes, potential food/drug interaactions and food list.  Also, stressed importance of taking coumadin as ordered and keeping INR aptts as scheduled.  Pt verbalized understanding.  Anticoagulation Management History:      The patient comes in today for her initial visit for anticoagulation therapy.  The patient is taking warfarin for Atrial Fibrillation .  Negative risk factors for bleeding include an age less than 59 years old, no history of CVA/TIA, no history of GI bleeding, and absence of serious comorbidities.  The bleeding index is 'low risk'.  Positive CHADS2 values include History of HTN.  Negative CHADS2 values include History of CHF, Age > 68 years old, History of Diabetes, and Prior Stroke/CVA/TIA.  The start date was 10/07/2009.  Anticoagulation responsible provider: Dietrich Pates MD, Molly Maduro.  INR POC: 2.8.  Cuvette Lot#: [id number].    Anticoagulation Management Assessment/Plan:      The patient's current anticoagulation dose is Warfarin sodium 5 mg tabs: Use as directed by Anticoagulation Clinic.  The target INR is 2.0-3.0.  The next INR is due 10/20/2009.  Anticoagulation instructions were given to patient.  Results were reviewed/authorized by  Vashti Hey, RN.  She was notified by Vashti Hey RN.         Current Anticoagulation Instructions: INR 2.8 Decrease coumadin to 7.5mg  once daily

## 2010-08-29 NOTE — Assessment & Plan Note (Signed)
Summary: NURSE VISIT PER TAMMY/TG  Nurse Visit   Vital Signs:  Patient profile:   60 year old female Pulse rate:   91 / minute BP sitting:   107 / 72  (left arm)  Vitals Entered ByLarita Fife Via LPN (October 27, 2009 3:10 PM)  History of Present Illness: Per phone note on 10-20-09, pt. arrives in office for a rhythm strip and vitals. She c/o palpitations, occasional chest tightness and swelling in feet. BP=107/72 and P=91. Rhythm strip obtained. Did not bring meds but states she takes as directed. Medications from hosp. d/c on 10-11-09 in chart.   11/01/09 Noted.  Will see at followup appointment as scheduled.  [place] Bing, M.D.      Current Medications (verified): 1)  Warfarin Sodium 5 Mg Tabs (Warfarin Sodium) .... Use As Directed By Anticoagulation Clinic 2)  Benzonatate 100 Mg Caps (Benzonatate) .... Take 1 To 2 Tabs Every 8 Hrs. 3)  Amoxicillin 500 Mg Caps (Amoxicillin) .... Every 8 Hrs. Until Gone 4)  Diltiazem Hcl 120 Mg Tabs (Diltiazem Hcl) .Marland Kitchen.. 1 Tab Daily 5)  Veramyst 27.5 Mcg/spray Susp (Fluticasone Furoate) .... 2 Sprays in Each Nostril Daily 6)  Atenolol 100 Mg Tabs (Atenolol) .Marland Kitchen.. 1 Tab By Mouth Once Daily 7)  Calcium-Vitamin D 500-125 Mg-Unit Tabs (Calcium-Vitamin D) .Marland Kitchen.. 1 Tab Daily 8)  Glucosamine 500 Mg Caps (Glucosamine Sulfate) .Marland Kitchen.. 1 Tab By Mouth Once Daily 9)  Salmon Oil  Caps (Nutritional Supplements) .Marland Kitchen.. 1 Tab Daily 10)  Nabumetone 750 Mg Tabs (Nabumetone) .Marland Kitchen.. 1 Tab By Mouth Two Times A Day 11)  Tylenol Extra Strength 500 Mg Tabs (Acetaminophen) .... Take 2 Tabs Every 6 Hrs. Prn 12)  Evening Primrose Oil 500 Mg Caps (Evening Primrose Oil) .Marland Kitchen.. 1 Tab Nightly

## 2010-08-29 NOTE — Medication Information (Signed)
Summary: CCR  Anticoagulant Therapy  Managed by: Vashti Hey, RN PCP: Rosine Abe Supervising MD: Diona Browner MD, Remi Deter Indication 1: Atrial Fibrillation Lab Used: [facility] [place] Site: [place] INR POC 2.2  Dietary changes: no    Health status changes: no    Bleeding/hemorrhagic complications: no    Recent/future hospitalizations: no    Any changes in medication regimen? no    Recent/future dental: no  Any missed doses?: no       Is patient compliant with meds? yes       Allergies: No Known Drug Allergies  Anticoagulation Management History:      The patient is taking warfarin for Atrial Fibrillation .  Negative risk factors for bleeding include an age less than 68 years old, no history of CVA/TIA, no history of GI bleeding, and absence of serious comorbidities.  The bleeding index is 'low risk'.  Positive CHADS2 values include History of HTN.  Negative CHADS2 values include History of CHF, Age > 16 years old, History of Diabetes, and Prior Stroke/CVA/TIA.  The start date was 10/07/2009.  Her last INR was 1.33.  Anticoagulation responsible provider: Diona Browner MD, Remi Deter.  INR POC: 2.2.    Anticoagulation Management Assessment/Plan:      The patient's current anticoagulation dose is Warfarin sodium 5 mg tabs: Use as directed by Anticoagulation Clinic.  The target INR is 2.0-3.0.  The next INR is due 11/23/2009.  Anticoagulation instructions were given to patient.  Results were reviewed/authorized by Vashti Hey, RN.  She was notified by Vashti Hey RN.         Prior Anticoagulation Instructions: INR 2.2 Continue coumadin 7.5mg  once daily   Current Anticoagulation Instructions: INR 2.2 Take coumadin 7.5mg  once daily except 10mg  on Wednesdays and Sundays

## 2010-08-29 NOTE — Assessment & Plan Note (Signed)
Summary: REQ INJ IN RT KNEE MAY NEED NEW XR/CONE DISC/BSF   Visit Type:  Follow-up Referring Provider:  . Primary Provider:  Rosine Abe  CC:  right knee pain.  History of Present Illness: I saw Daisy Black in the office today for a followup visit.  She is a 60 years old woman with the complaint of:  right knee pain  She also has some RIGHT knee pain previously seen at [facility] and also in [place] on the schedule to have partial versus total knee replacement and developed atrial fibrillation, which required treatment with Coumadin. She subsequently has come on the [facility] health system plan  01/25/10 received injection in our office for the right knee OA.  The injection lasted 3 months.  Brought xrays last visit    Meds: Atenolol, Celebrex 200mg , Lisinopril, Synthroid, Salmon oil, Calcium.  ROS INCISIONAL HERNIA NEEDS SURGERY   She is complaining of pain and difficulty with activities of daily living and says "I have to do something.  She is now off the Coumadin has not had any more atrial fibrillation.  She is scheduled for surgery for incisional hernia.  She like an injection today since it did so well last time but she is leaning towards having knee replacement surgery  Allergies: No Known Drug Allergies   Impression & Recommendations:  Problem # 1:  KNEE, ARTHRITIS, DEGEN./OSTEO (ICD-715.96)  3 views RIGHT knee shows lateral bone to bone changes with valgus arthritis moderate  Impression valgus osteoarthritis  RIGHT knee injection lateral approach Verbal consent was obtained. The knee was prepped with alcohol and ethyl chloride. 1 cc of depomedrol 40mg /cc and 4 cc of lidocaine 1% was injected. there were no complications.  Her updated medication list for this problem includes:    Nabumetone 750 Mg Tabs (Nabumetone) .Marland Kitchen... 1 tab by mouth two times a day    Tramadol Hcl 50 Mg Tabs (Tramadol hcl) .Marland Kitchen... Take 1 tab daily    Tylenol Arthritis  Pain 650 Mg Cr-tabs (Acetaminophen) .Marland Kitchen... Take as directed for pain    Aspirin 81 Mg Tbec (Aspirin) .Marland Kitchen... Take one tablet by mouth daily  Orders: Est. Patient Level III (14782) Joint Aspirate / Injection, Large (20610) Depo- Medrol 40mg  (J1030) Knee x-ray,  3 views (95621)  Patient Instructions: 1)  You have received an injection of cortisone today. You may experience increased pain at the injection site. Apply ice pack to the area for 20 minutes every 2 hours and take 2 xtra strength tylenol every 8 hours. This increased pain will usually resolve in 24 hours. The injection will take effect in 3-10 days.  2)  Read the knee replacement literature and go tot the joint replcement class Nov 10 th  3)  I will see you in 1 month    Orders Added: 1)  Est. Patient Level III [30865] 2)  Joint Aspirate / Injection, Large [20610] 3)  Depo- Medrol 40mg  [J1030] 4)  Knee x-ray,  3 views [73562]

## 2010-08-29 NOTE — Letter (Signed)
Summary: Appointment - Missed  [facility] [place]  [address] [place] [address]   Phone: [phone]  Fax: [phone]     November 16, 2009 MRN: [id number]   JERRELL HART [address] [place], [place]  [address]   Dear Ms. BOEHNING,  Our records indicate you missed your appointment on        11/16/09 COUMADIN CLINIC    It is very important that we reach you to reschedule this appointment. We look forward to participating in your health care needs. Please contact us at the number listed above at your earliest convenience to reschedule this appointment.     Sincerely,    Glass blower/designer

## 2010-08-29 NOTE — Medication Information (Signed)
Summary: ccr-lr  Anticoagulant Therapy  Managed by: Vashti Hey, RN PCP: Rosine Abe Supervising MD: Diona Browner MD, Remi Deter Indication 1: Atrial Fibrillation Lab Used: [facility]  Site: [place] INR POC 1.8  Dietary changes: no    Health status changes: no    Bleeding/hemorrhagic complications: no    Recent/future hospitalizations: no    Any changes in medication regimen? no    Recent/future dental: no  Any missed doses?: no       Is patient compliant with meds? yes       Allergies: No Known Drug Allergies  Anticoagulation Management History:      The patient is taking warfarin and comes in today for a routine follow up visit.  The patient is on warfarin for Atrial Fibrillation .  Negative risk factors for bleeding include an age less than 35 years old, no history of CVA/TIA, no history of GI bleeding, and absence of serious comorbidities.  The bleeding index is 'low risk'.  Positive CHADS2 values include History of HTN.  Negative CHADS2 values include History of CHF, Age > 89 years old, History of Diabetes, and Prior Stroke/CVA/TIA.  The start date was 10/07/2009.  Her last INR was 1.33.  Anticoagulation responsible provider: Diona Browner MD, Remi Deter.  INR POC: 1.8.  Cuvette Lot#: [id number].  Exp: 11/2010.    Anticoagulation Management Assessment/Plan:      The patient's current anticoagulation dose is Warfarin sodium 5 mg tabs: Use as directed by Anticoagulation Clinic, Coumadin 5 mg tabs: Sunday - 2 tabs, Monday - 1.5 tabs, Tuesday - 1.5 tabs, Wednesday - 2 tabs, Thursday - 1.5 tabs, Friday - 1.5 tabs, Saturday - 1.5 tabs.  The target INR is 2.0-3.0.  The next INR is due 01/25/2010.  Anticoagulation instructions were given to patient.  Results were reviewed/authorized by Vashti Hey, RN.  She was notified by Vashti Hey RN.        Coagulation management information includes: S/P DCCV 12/08/09  Appt with RR 7/18  May be able to come off then.  Prior  Anticoagulation Instructions: INR 1.7 Take coumadin 3 tablets tonight, 2 1/2 tablets tomorrow night then resume 1 1/2 tablets once daily except 2 tablets on Sundays and Wednesdays  Current Anticoagulation Instructions: INR 1.8 Increase coumadin to 10mg  once daily except 7.5mg  on Sundays, Tuesdays and Thursdays

## 2010-08-29 NOTE — Miscellaneous (Signed)
Summary: hospital labs 10/11/2009  Clinical Lists Changes  Observations: Added new observation of MCV: 92.1 fL (10/11/2009 9:25) Added new observation of HCT: 40.5 % (10/11/2009 9:25) Added new observation of HGB: 14.1 g/dL (34/74/2595 6:38) Added new observation of WBC COUNT: 15.8 10*3/microliter (10/11/2009 9:25) Added new observation of CALCIUM: 10.2 mg/dL (75/64/3329 5:18) Added new observation of ALBUMIN: 3.4 g/dL (84/16/6063 0:16) Added new observation of PROTEIN, TOT: 5.9 g/dL (08/07/3233 5:73) Added new observation of SGPT (ALT): 17 units/L (10/10/2009 9:25) Added new observation of SGOT (AST): 21 units/L (10/10/2009 9:25) Added new observation of ALK PHOS: 91 units/L (10/10/2009 9:25) Added new observation of GFR AA: >60 mL/min/1.75m2 (10/10/2009 9:25) Added new observation of GFR: >60 mL/min (10/10/2009 9:25) Added new observation of CREATININE: 0.80 mg/dL (22/08/5425 0:62) Added new observation of BUN: 10 mg/dL (37/62/8315 1:76) Added new observation of BG RANDOM: 87 mg/dL (16/01/3709 6:26) Added new observation of CO2 PLSM/SER: 26 meq/L (10/10/2009 9:25) Added new observation of CL SERUM: 112 meq/L (10/10/2009 9:25) Added new observation of K SERUM: 4.3 meq/L (10/10/2009 9:25) Added new observation of NA: 142 meq/L (10/10/2009 9:25) Added new observation of MCV: 42.0 fL (10/10/2009 9:25) Added new observation of HCT: 14.3 % (10/10/2009 9:25) Added new observation of HGB: 4.53 g/dL (94/85/4627 0:35) Added new observation of WBC COUNT: 15.0 10*3/microliter (10/10/2009 9:25) Added new observation of INR: 1.33  (10/10/2009 9:25) Added new observation of PT PATIENT: 16.4 s (10/10/2009 9:25)

## 2010-08-29 NOTE — Medication Information (Signed)
Summary: Coumadin Clinic  Anticoagulant Therapy  Managed by: Inactive PCP: Rosine Abe Supervising MD: Dietrich Pates MD, Molly Maduro Indication 1: Atrial Fibrillation Lab Used: [facility] [place] Site: [place]          Comments: coumadin was stopped and changed to ASA 81mg  once daily   Allergies: No Known Drug Allergies  Anticoagulation Management History:      She is being anticoagulated due to Atrial Fibrillation .  Negative risk factors for bleeding include an age less than 40 years old, no history of CVA/TIA, no history of GI bleeding, and absence of serious comorbidities.  The bleeding index is 'low risk'.  Positive CHADS2 values include History of HTN.  Negative CHADS2 values include History of CHF, Age > 56 years old, History of Diabetes, and Prior Stroke/CVA/TIA.  The start date was 10/07/2009.  Her last INR was 1.33.  Anticoagulation responsible provider: Dietrich Pates MD, Molly Maduro.  Exp: 11/2010.    Anticoagulation Management Assessment/Plan:      The target INR is 2.0-3.0.  The next INR is due 02/13/2010.  Anticoagulation instructions were given to patient.  Results were reviewed/authorized by Inactive.         Prior Anticoagulation Instructions: INR 2.6 Continue coumadin 10mg  once daily except 7.5mg  on Sundays, Tuesdays and Thursdays

## 2010-08-29 NOTE — Letter (Signed)
Summary: [place] [facility]  [address] [place] [address]   Phone: [phone]  Fax: [phone]      Dec 05, 2009 MRN: [id number]   Daisy Black [address] [place], [place]  [address]   Dear Ms. Laural Benes,  Your test ordered by Selena Batten has been reviewed by your physician (or physician assistant) and was found to be normal or stable. Your physician (or physician assistant) felt no changes were needed at this time.  ____ Echocardiogram  ____ Cardiac Stress Test  __x__ Lab Work  ____ Peripheral vascular study of arms, legs or neck  ____ CT scan or X-ray  ____ Lung or Breathing test  ____ Other:  No change in medical treatment at this time, per Dr. Dietrich Pates.  Enclosed is a copy of your labwork for  your records.  Thank you, Korbyn Chopin Allyne Gee RN    Door Bing, MD, Lenise Arena.C.Gaylord Shih, MD, F.A.C.C Lewayne Bunting, MD, F.A.C.C Nona Dell, MD, F.A.C.C Charlton Haws, MD, Lenise Arena.C.C

## 2010-10-04 DIAGNOSIS — Z833 Family history of diabetes mellitus: Secondary | ICD-10-CM

## 2010-10-04 DIAGNOSIS — R7989 Other specified abnormal findings of blood chemistry: Secondary | ICD-10-CM | POA: Insufficient documentation

## 2010-10-04 DIAGNOSIS — Z8261 Family history of arthritis: Secondary | ICD-10-CM

## 2010-10-04 DIAGNOSIS — Z8249 Family history of ischemic heart disease and other diseases of the circulatory system: Secondary | ICD-10-CM

## 2010-10-04 DIAGNOSIS — Z7901 Long term (current) use of anticoagulants: Secondary | ICD-10-CM

## 2010-10-11 LAB — BASIC METABOLIC PANEL
CO2: 27 mEq/L (ref 19–32)
Calcium: 10.5 mg/dL (ref 8.4–10.5)
Chloride: 107 mEq/L (ref 96–112)
GFR calc Af Amer: >60 mL/min (ref >60)
GFR calc non Af Amer: >60 mL/min (ref >60)
Glucose, Bld: 83 mg/dL (ref 70–99)
Potassium: 4 mEq/L (ref 3.5–5.1)
Sodium: 141 mEq/L (ref 135–145)

## 2010-10-11 LAB — SURGICAL PCR SCREEN: MRSA, PCR: NEGATIVE

## 2010-10-11 LAB — CBC
HCT: 39.7 % (ref 36.0–46.0)
MCHC: 33.5 g/dL (ref 30.0–36.0)
MCV: 93.9 fL (ref 78.0–100.0)
Platelets: 185 10*3/uL (ref 150–400)
WBC: 15 10*3/uL — ABNORMAL HIGH (ref 4.0–10.5)

## 2010-10-23 LAB — BASIC METABOLIC PANEL
CO2: 26 mEq/L (ref 19–32)
Chloride: 110 mEq/L (ref 96–112)
GFR calc Af Amer: >60 mL/min (ref >60)
Potassium: 4.3 mEq/L (ref 3.5–5.1)
Sodium: 143 mEq/L (ref 135–145)

## 2010-10-23 LAB — COMPREHENSIVE METABOLIC PANEL
ALT: 14 U/L (ref 0–35)
ALT: 17 U/L (ref 0–35)
AST: 17 U/L (ref 0–37)
AST: 17 U/L (ref 0–37)
Albumin: 3.3 g/dL — ABNORMAL LOW (ref 3.5–5.2)
BUN: 10 mg/dL (ref 6–23)
CO2: 24 mEq/L (ref 19–32)
CO2: 24 mEq/L (ref 19–32)
Calcium: 10.2 mg/dL (ref 8.4–10.5)
Calcium: 9.6 mg/dL (ref 8.4–10.5)
Calcium: 9.9 mg/dL (ref 8.4–10.5)
Chloride: 109 mEq/L (ref 96–112)
Creatinine, Ser: 0.63 mg/dL (ref 0.4–1.2)
Creatinine, Ser: 0.75 mg/dL (ref 0.4–1.2)
GFR calc Af Amer: >60 mL/min (ref >60)
GFR calc Af Amer: >60 mL/min (ref >60)
GFR calc non Af Amer: >60 mL/min (ref >60)
GFR calc non Af Amer: >60 mL/min (ref >60)
Glucose, Bld: 87 mg/dL (ref 70–99)
Sodium: 140 mEq/L (ref 135–145)
Sodium: 140 mEq/L (ref 135–145)
Total Bilirubin: 0.5 mg/dL (ref 0.3–1.2)
Total Protein: 5.9 g/dL — ABNORMAL LOW (ref 6.0–8.3)
Total Protein: 5.9 g/dL — ABNORMAL LOW (ref 6.0–8.3)

## 2010-10-23 LAB — DIFFERENTIAL
Basophils Absolute: 0 10*3/uL (ref 0.0–0.1)
Basophils Relative: 0 % (ref 0–1)
Basophils Relative: 0 % (ref 0–1)
Basophils Relative: 0 % (ref 0–1)
Eosinophils Relative: 2 % (ref 0–5)
Eosinophils Relative: 2 % (ref 0–5)
Lymphocytes Relative: 65 % — ABNORMAL HIGH (ref 12–46)
Lymphocytes Relative: 70 % — ABNORMAL HIGH (ref 12–46)
Lymphocytes Relative: 70 % — ABNORMAL HIGH (ref 12–46)
Lymphocytes Relative: 71 % — ABNORMAL HIGH (ref 12–46)
Lymphs Abs: 10.5 10*3/uL — ABNORMAL HIGH (ref 0.7–4.0)
Lymphs Abs: 11.3 10*3/uL — ABNORMAL HIGH (ref 0.7–4.0)
Lymphs Abs: 9.9 10*3/uL — ABNORMAL HIGH (ref 0.7–4.0)
Monocytes Absolute: 0.6 10*3/uL (ref 0.1–1.0)
Monocytes Relative: 4 % (ref 3–12)
Monocytes Relative: 4 % (ref 3–12)
Monocytes Relative: 4 % (ref 3–12)
Monocytes Relative: 5 % (ref 3–12)
Monocytes Relative: 5 % (ref 3–12)
Neutro Abs: 3.3 10*3/uL (ref 1.7–7.7)
Neutro Abs: 3.6 10*3/uL (ref 1.7–7.7)
Neutro Abs: 3.6 10*3/uL (ref 1.7–7.7)
Neutro Abs: 4.3 10*3/uL (ref 1.7–7.7)
Neutrophils Relative %: 21 % — ABNORMAL LOW (ref 43–77)
Neutrophils Relative %: 24 % — ABNORMAL LOW (ref 43–77)
Neutrophils Relative %: 29 % — ABNORMAL LOW (ref 43–77)

## 2010-10-23 LAB — PROTIME-INR
INR: 0.89 (ref 0.00–1.49)
INR: 1.14 (ref 0.00–1.49)
INR: 1.33 (ref 0.00–1.49)
INR: 1.74 — ABNORMAL HIGH (ref 0.00–1.49)
Prothrombin Time: 12 seconds (ref 11.6–15.2)
Prothrombin Time: 13.9 seconds (ref 11.6–15.2)
Prothrombin Time: 16.4 seconds — ABNORMAL HIGH (ref 11.6–15.2)
Prothrombin Time: 20.2 seconds — ABNORMAL HIGH (ref 11.6–15.2)

## 2010-10-23 LAB — CBC
HCT: 42 % (ref 36.0–46.0)
Hemoglobin: 14.3 g/dL (ref 12.0–15.0)
Hemoglobin: 15.3 g/dL — ABNORMAL HIGH (ref 12.0–15.0)
MCHC: 33.9 g/dL (ref 30.0–36.0)
MCHC: 34.3 g/dL (ref 30.0–36.0)
MCHC: 34.7 g/dL (ref 30.0–36.0)
MCV: 92.6 fL (ref 78.0–100.0)
Platelets: 164 10*3/uL (ref 150–400)
Platelets: 171 10*3/uL (ref 150–400)
Platelets: 187 10*3/uL (ref 150–400)
Platelets: 191 10*3/uL (ref 150–400)
RBC: 4.4 MIL/uL (ref 3.87–5.11)
RBC: 4.46 MIL/uL (ref 3.87–5.11)
RBC: 4.55 MIL/uL (ref 3.87–5.11)
RDW: 13.7 % (ref 11.5–15.5)
RDW: 13.9 % (ref 11.5–15.5)
RDW: 13.9 % (ref 11.5–15.5)
WBC: 15.8 10*3/uL — ABNORMAL HIGH (ref 4.0–10.5)
WBC: 15.8 10*3/uL — ABNORMAL HIGH (ref 4.0–10.5)

## 2010-10-23 LAB — LIPID PANEL
HDL: 41 mg/dL (ref >39)
LDL Cholesterol: 130 mg/dL — ABNORMAL HIGH (ref 0–99)
Triglycerides: 102 mg/dL (ref <150)

## 2010-10-23 LAB — TSH: TSH: 22.613 u[IU]/mL — ABNORMAL HIGH (ref 0.350–4.500)

## 2010-10-23 LAB — HEPARIN LEVEL (UNFRACTIONATED)
Heparin Unfractionated: 0.86 IU/mL — ABNORMAL HIGH (ref 0.30–0.70)
Heparin Unfractionated: <0.1 IU/mL — ABNORMAL LOW (ref 0.30–0.70)

## 2010-10-23 LAB — T3, FREE: T3, Free: 2.5 pg/mL (ref 2.3–4.2)

## 2010-10-23 LAB — CARDIAC PANEL(CRET KIN+CKTOT+MB+TROPI)
CK, MB: 1.3 ng/mL (ref 0.3–4.0)
Relative Index: INVALID (ref 0.0–2.5)
Total CK: 44 U/L (ref 7–177)
Troponin I: <0.01 ng/mL (ref 0.00–0.06)

## 2010-10-23 LAB — SEDIMENTATION RATE: Sed Rate: 5 mm/hr (ref 0–22)

## 2010-10-23 LAB — C-REACTIVE PROTEIN: CRP: 0.5 mg/dL — ABNORMAL LOW (ref <0.6)

## 2010-10-23 LAB — MONONUCLEOSIS SCREEN: Mono Screen: NEGATIVE

## 2010-10-23 LAB — APTT: aPTT: 25 seconds (ref 24–37)

## 2010-10-23 LAB — T4: T4, Total: 8.8 ug/dL (ref 5.0–12.5)

## 2010-10-23 LAB — T4, FREE
Free T4: 0.97 ng/dL (ref 0.80–1.80)
Free T4: 1.03 ng/dL (ref 0.80–1.80)

## 2010-10-23 LAB — PROLACTIN: Prolactin: 7.8 ng/mL

## 2010-10-23 LAB — FERRITIN: Ferritin: 29 ng/mL (ref 10–291)

## 2010-10-30 ENCOUNTER — Ambulatory Visit: Payer: Self-pay | Admitting: Cardiology

## 2010-11-16 ENCOUNTER — Ambulatory Visit (INDEPENDENT_AMBULATORY_CARE_PROVIDER_SITE_OTHER): Payer: Self-pay | Admitting: Cardiology

## 2010-11-16 ENCOUNTER — Encounter: Payer: Self-pay | Admitting: Cardiology

## 2010-11-16 VITALS — BP 112/62 | HR 60 | Ht 67.0 in | Wt 236.0 lb

## 2010-11-16 DIAGNOSIS — I4891 Unspecified atrial fibrillation: Secondary | ICD-10-CM

## 2010-11-16 DIAGNOSIS — I4819 Other persistent atrial fibrillation: Secondary | ICD-10-CM | POA: Insufficient documentation

## 2010-11-16 DIAGNOSIS — I10 Essential (primary) hypertension: Secondary | ICD-10-CM

## 2010-11-16 DIAGNOSIS — E785 Hyperlipidemia, unspecified: Secondary | ICD-10-CM

## 2010-11-16 NOTE — Assessment & Plan Note (Signed)
There has been no clinical recurrence of atrial fibrillation.  I explained to the patient that reoccurrence of her arrhythmia is likely in the future, but unpredictable.  Should she experience recurrent palpitations, she will use additional doses of atenolol up to a total of 50 mg.  If that is not successful, she will call our office at the time of her arrhythmia or early the next morning.  If she experiences significant associated symptoms such as dyspnea or chest discomfort, she will seek care in the emergency department.

## 2010-11-16 NOTE — Assessment & Plan Note (Signed)
Blood pressure control is excellent, and the patient would like to simplify her medical regime.  She will stop taking atenolol on a routine basis and monitor blood pressures at home.  She will call for values above 140 systolic or 90 diastolic.  Otherwise, she will use atenolol p.r.n. For palpitations.

## 2010-11-16 NOTE — Assessment & Plan Note (Addendum)
Most recent lipid profile available to me showed moderate hyperlipidemia but does not meet criteria for pharmacologic therapy.  Patient was told at her most recent visit to the [facility] that values had improved.

## 2010-11-16 NOTE — Patient Instructions (Signed)
Your physician recommends that you schedule a follow-up appointment in: 1 year Your physician has recommended you make the following change in your medication: change atenolol to as needed : if atrial fibrillation reccurs and only palpitations may take atenolol 25mg  ; if no better in 1 hr take additional 25mg  ; if no better then call our office. Your physician has requested that you regularly monitor and record your blood pressure readings at home. Please use the same machine at the same time of day to check your readings and record them to bring to your follow-up visit.  Monitor bp callfor systolic > 140 diastolic >90

## 2010-11-16 NOTE — Progress Notes (Signed)
HPI : Daisy Black returns to the office for continued assessment and treatment of paroxysmal atrial fibrillation.  She initially presented in March of 2011 with palpitations and mild hypothyroidism.  Despite the fact that there was no clear precipitant for her arrhythmia, she has had no significant recurrence.  She does experience occasional palpitations that sound like single prematures as well as brief episodes of fluttering that last a matter of seconds.  She has no chest pain, dyspnea, orthopnea, PND no syncope.  She has not noted any significant pedal edema.  Current Outpatient Prescriptions on File Prior to Visit  Medication Sig Dispense Refill  . acetaminophen (TYLENOL) 650 MG CR tablet Take 650 mg by mouth every 8 (eight) hours as needed.        Marland Kitchen aspirin 81 MG tablet Take 81 mg by mouth daily.        Marland Kitchen atenolol (TENORMIN) 25 MG tablet Take 25 mg by mouth as needed. If atrial fib recurs and only palpitations may take 25mg  , if no betterin 1 hr take an additional 25mg  ; if no better call our office      . Calcium Carbonate-Vitamin D (CALCIUM 500/D) 500-125 MG-UNIT TABS Take 1 tablet by mouth daily.        Marland Kitchen lisinopril (PRINIVIL,ZESTRIL) 20 MG tablet Take 20 mg by mouth daily.        . Omega-3 Fatty Acids (SALMON OIL) 200 MG CAPS Take 1 capsule by mouth daily.        . traMADol (ULTRAM) 50 MG tablet Take 50 mg by mouth daily.        Marland Kitchen DISCONTD: Evening Primrose Oil 500 MG CAPS Take 1 capsule by mouth at bedtime.        Marland Kitchen DISCONTD: Glucosamine Sulfate 500 MG TABS Take 1 tablet by mouth daily.        Marland Kitchen DISCONTD: levothyroxine (SYNTHROID, LEVOTHROID) 100 MCG tablet Take 100 mcg by mouth daily.       Marland Kitchen DISCONTD: nabumetone (RELAFEN) 750 MG tablet Take 750 mg by mouth 2 (two) times daily.           No Known Allergies    Past medical history, social history, and family history reviewed and updated.  ROS:  See history of present illness.  PHYSICAL EXAM: BP 112/62  Pulse 60  Ht 5\' 7"  (1.702  m)  Wt 236 lb (107.049 kg)  BMI 36.96 kg/m2  SpO2 96%  General-Well developed; no acute distress Body habitus-proportionate weight and height Neck-No JVD; no carotid bruits Lungs-clear lung fields; resonant to percussion Cardiovascular-normal PMI; normal S1 and S2; regular rhythm; modest early systolic ejection murmur Abdomen-normal bowel sounds; soft and non-tender without masses or organomegaly Musculoskeletal-No deformities, no cyanosis or clubbing Neurologic-Normal cranial nerves; symmetric strength and tone Skin-Warm, no significant lesions Extremities-distal pulses intact; no edema  ASSESSMENT AND PLAN:

## 2011-02-20 ENCOUNTER — Ambulatory Visit: Payer: Self-pay

## 2011-03-07 ENCOUNTER — Ambulatory Visit: Payer: Self-pay

## 2011-08-03 ENCOUNTER — Encounter (HOSPITAL_COMMUNITY): Payer: Self-pay | Admitting: Emergency Medicine

## 2011-08-03 ENCOUNTER — Other Ambulatory Visit: Payer: Self-pay

## 2011-08-03 ENCOUNTER — Emergency Department (HOSPITAL_COMMUNITY)
Admission: EM | Admit: 2011-08-03 | Discharge: 2011-08-03 | Disposition: A | Discharge status: Home / Self Care | Payer: Self-pay | Attending: Emergency Medicine | Admitting: Emergency Medicine

## 2011-08-03 ENCOUNTER — Emergency Department (HOSPITAL_COMMUNITY): Payer: Self-pay

## 2011-08-03 DIAGNOSIS — R05 Cough: Secondary | ICD-10-CM | POA: Insufficient documentation

## 2011-08-03 DIAGNOSIS — D72829 Elevated white blood cell count, unspecified: Secondary | ICD-10-CM | POA: Insufficient documentation

## 2011-08-03 DIAGNOSIS — I4891 Unspecified atrial fibrillation: Secondary | ICD-10-CM | POA: Insufficient documentation

## 2011-08-03 DIAGNOSIS — I10 Essential (primary) hypertension: Secondary | ICD-10-CM | POA: Insufficient documentation

## 2011-08-03 DIAGNOSIS — R059 Cough, unspecified: Secondary | ICD-10-CM | POA: Insufficient documentation

## 2011-08-03 DIAGNOSIS — R0602 Shortness of breath: Secondary | ICD-10-CM | POA: Insufficient documentation

## 2011-08-03 DIAGNOSIS — E785 Hyperlipidemia, unspecified: Secondary | ICD-10-CM | POA: Insufficient documentation

## 2011-08-03 DIAGNOSIS — R079 Chest pain, unspecified: Secondary | ICD-10-CM | POA: Insufficient documentation

## 2011-08-03 LAB — DIFFERENTIAL
Basophils Absolute: 0 10*3/uL (ref 0.0–0.1)
Basophils Relative: 0 % (ref 0–1)
Monocytes Relative: 3 % (ref 3–12)
Neutro Abs: 3.7 10*3/uL (ref 1.7–7.7)
Neutrophils Relative %: 18 % — ABNORMAL LOW (ref 43–77)

## 2011-08-03 LAB — BASIC METABOLIC PANEL
GFR calc Af Amer: >90 mL/min (ref >90)
GFR calc non Af Amer: >90 mL/min (ref >90)
Potassium: 3.9 mEq/L (ref 3.5–5.1)
Sodium: 140 mEq/L (ref 135–145)

## 2011-08-03 LAB — TROPONIN I: Troponin I: <0.3 ng/mL (ref <0.30)

## 2011-08-03 LAB — CBC
Hemoglobin: 13.5 g/dL (ref 12.0–15.0)
MCHC: 31.9 g/dL (ref 30.0–36.0)
RDW: 14.1 % (ref 11.5–15.5)

## 2011-08-03 LAB — D-DIMER, QUANTITATIVE: D-Dimer, Quant: 0.33 ug/mL-FEU (ref 0.00–0.48)

## 2011-08-03 MED ORDER — ALBUTEROL SULFATE (2.5 MG/3ML) 0.083% IN NEBU: 2.5000 mg | INHALATION_SOLUTION | Freq: Four times a day (QID) | RESPIRATORY_TRACT | Status: DC | PRN

## 2011-08-03 MED ORDER — SODIUM CHLORIDE 0.9 % IV SOLN: INTRAVENOUS | Status: DC

## 2011-08-03 MED ORDER — ALBUTEROL SULFATE HFA 108 (90 BASE) MCG/ACT IN AERS: 1.0000 | INHALATION_SPRAY | Freq: Four times a day (QID) | RESPIRATORY_TRACT | Status: DC | PRN

## 2011-08-03 MED ORDER — ALBUTEROL SULFATE (5 MG/ML) 0.5% IN NEBU
5.0000 mg | INHALATION_SOLUTION | Freq: Once | RESPIRATORY_TRACT | Status: AC
  Administered 2011-08-03: 5 mg via RESPIRATORY_TRACT
  Filled 2011-08-03: qty 1

## 2011-08-03 NOTE — ED Provider Notes (Signed)
Patient turned over to me pending lab results. Concern was pending d-dimer which was negative and pending troponin which was negative. She has marked leukocytosis. X-ray was negative for pneumonia.  Results for orders placed during the hospital encounter of 08/03/11  CBC      Component Value Range   WBC 20.1 (*) 4.0 - 10.5 (K/uL)   RBC 4.51  3.87 - 5.11 (MIL/uL)   Hemoglobin 13.5  12.0 - 15.0 (g/dL)   HCT 40.9  81.1 - 91.4 (%)   MCV 93.8  78.0 - 100.0 (fL)   MCH 29.9  26.0 - 34.0 (pg)   MCHC 31.9  30.0 - 36.0 (g/dL)   RDW 78.2  95.6 - 21.3 (%)   Platelets 203  150 - 400 (K/uL)  DIFFERENTIAL      Component Value Range   Neutrophils Relative 18 (*) 43 - 77 (%)   Neutro Abs 3.7  1.7 - 7.7 (K/uL)   Lymphocytes Relative 77 (*) 12 - 46 (%)   Lymphs Abs 15.5 (*) 0.7 - 4.0 (K/uL)   Monocytes Relative 3  3 - 12 (%)   Monocytes Absolute 0.6  0.1 - 1.0 (K/uL)   Eosinophils Relative 2  0 - 5 (%)   Eosinophils Absolute 0.4  0.0 - 0.7 (K/uL)   Basophils Relative 0  0 - 1 (%)   Basophils Absolute 0.0  0.0 - 0.1 (K/uL)   WBC Morphology ATYPICAL LYMPHOCYTES    BASIC METABOLIC PANEL      Component Value Range   Sodium 140  135 - 145 (mEq/L)   Potassium 3.9  3.5 - 5.1 (mEq/L)   Chloride 106  96 - 112 (mEq/L)   CO2 30  19 - 32 (mEq/L)   Glucose, Bld 96  70 - 99 (mg/dL)   BUN 14  6 - 23 (mg/dL)   Creatinine, Ser 0.86  0.50 - 1.10 (mg/dL)   Calcium 57.8 (*) 8.4 - 10.5 (mg/dL)   GFR calc non Af Amer >90  >90 (mL/min)   GFR calc Af Amer >90  >90 (mL/min)  D-DIMER, QUANTITATIVE      Component Value Range   D-Dimer, Quant 0.33  0.00 - 0.48 (ug/mL-FEU)  TROPONIN I      Component Value Range   Troponin I <0.30  <0.30 (ng/mL)   Dg Chest 2 View  08/03/2011  *RADIOLOGY REPORT*  Clinical Data: Chest pain, shortness of breath  CHEST - 2 VIEW  Comparison: Chest x-ray of 10/08/2009  Findings: The lungs remain clear but hyperaerated.  Mediastinal contours are stable.  The heart is borderline enlarged.   There are mild degenerative changes in the thoracic spine.  IMPRESSION: No active lung disease.  No change in hyperaeration.  Original Report Authenticated By: Juline Patch, M.D.   Some concerning for the patient's leukocytosis the patient in the emergency department clinically is nontoxic in no acute distress. Been on a course of antibiotics and had some improvement on that does have primary care Dr. In the [place] area. Will renew patient's overall nebulizer treatment and inhaler which she has been using which may be beneficial for her current symptoms. The workup was negative for any concern for pulmonary embolism acute cardiac event pneumonia or pneumothorax.   Shelda Jakes, MD 08/03/11 [phone]

## 2011-08-03 NOTE — ED Provider Notes (Signed)
History     CSN: [id number]  Arrival date & time 08/03/11  0603   First MD Initiated Contact with Patient 08/03/11 0645      Chief Complaint  Patient presents with  . Shortness of Breath  . Chest Pain    (Consider location/radiation/quality/duration/timing/severity/associated sxs/prior treatment) Patient is a 61 y.o. female presenting with shortness of breath and chest pain. The history is provided by the patient.  Shortness of Breath  The current episode started more than 2 weeks ago. Associated symptoms include chest pain, cough and shortness of breath. Pertinent negatives include no rhinorrhea.  Chest Pain Primary symptoms include shortness of breath and cough. Pertinent negatives for primary symptoms include no fatigue and no abdominal pain.  Pertinent negatives for associated symptoms include no diaphoresis.    patient had a cough and shortness of breath the last 2 weeks. She seen by her primary care is given antibiotic which she took for 10 days. She's continued to have cough. She's also had a prescription for prednisone, she was unable to take it because it made her heart rate go up. Stage she feels a burning in her chest. She said the coughing is cleared up. No fevers. She states she saw her PCP who told her that it should get better daily. She states she's used albuterol inhaler without relief. She states she's not currently in A. fib. No weight loss. No hemoptysis. No recent travel. She does not smoke. She's not currently on anticoagulation.  Past Medical History  Diagnosis Date  . Hypertension   . Hyperlipidemia   . Obesity   . [place]ath-induced atrial fibrillation 2 vessel and    Atrial fibrillation-onset in 09/2009; DC cardioversion in 10/2009  . Lymphocytosis   . TSH elevation     Past Surgical History  Procedure Date  . Hernia repair 2011    Incisional and umbilical utilizing mesh  . Cholecystectomy   . Rotator cuff repair   . Ankle surgery     Right  .  Tonsillectomy     Family History  Problem Relation Age of Onset  . Cancer Mother 27    secondary to ovarian cancer  . Stroke Father 69    brain stem infarction    History  Substance Use Topics  . Smoking status: Never Smoker   . Smokeless tobacco: Never Used  . Alcohol Use: No    OB History    Grav Para Term Preterm Abortions TAB SAB Ect Mult Living                  Review of Systems  Constitutional: Negative for diaphoresis and fatigue.  HENT: Negative for rhinorrhea, sneezing and postnasal drip.   Respiratory: Positive for cough, chest tightness and shortness of breath.   Cardiovascular: Positive for chest pain.  Gastrointestinal: Negative for abdominal pain.  Genitourinary: Negative for hematuria and pelvic pain.  Musculoskeletal: Negative for back pain.  Skin: Negative for pallor.  Psychiatric/Behavioral: Negative for confusion.    Allergies  Review of patient's allergies indicates no known allergies.  Home Medications   Current Outpatient Rx  Name Route Sig Dispense Refill  . LOSARTAN POTASSIUM 50 MG PO TABS Oral Take 50 mg by mouth daily.      . ACETAMINOPHEN ER 650 MG PO TBCR Oral Take 650 mg by mouth every 8 (eight) hours as needed.      . ASPIRIN 81 MG PO TABS Oral Take 81 mg by mouth daily.      Marland Kitchen  ATENOLOL 25 MG PO TABS Oral Take 25 mg by mouth as needed. If atrial fib recurs and only palpitations may take 25mg  , if no betterin 1 hr take an additional 25mg  ; if no better call our office    . CALCIUM CARBONATE-VITAMIN D 500-125 MG-UNIT PO TABS Oral Take 1 tablet by mouth daily.      Marland Kitchen FLUTICASONE FUROATE 27.5 MCG/SPRAY NA SUSP Nasal 2 sprays by Nasal route as needed.      Marland Kitchen LEVOTHYROXINE SODIUM 112 MCG PO TABS Oral Take 124 mcg by mouth daily.     Marland Kitchen LISINOPRIL 20 MG PO TABS Oral Take 20 mg by mouth daily.      Marland Kitchen SALMON OIL 200 MG PO CAPS Oral Take 1 capsule by mouth daily.      . TRAMADOL HCL 50 MG PO TABS Oral Take 50 mg by mouth daily.        BP  150/68  Pulse 87  Temp(Src) 97.3 F (36.3 C) (Oral)  Resp 18  Ht 5\' 7"  (1.702 m)  Wt 246 lb (111.585 kg)  BMI 38.53 kg/m2  SpO2 100%  Physical Exam  Nursing note and vitals reviewed. Constitutional: She is oriented to person, place, and time. She appears well-developed and well-nourished.  HENT:  Head: Normocephalic and atraumatic.  Eyes: Pupils are equal, round, and reactive to light.  Neck: Normal range of motion. Neck supple.  Cardiovascular: Normal rate, regular rhythm and normal heart sounds.   No murmur heard. Pulmonary/Chest: Effort normal and breath sounds normal. No respiratory distress. She has no wheezes. She has no rales.  Abdominal: Soft. Bowel sounds are normal. She exhibits no distension. There is no tenderness. There is no rebound and no guarding.  Musculoskeletal: Normal range of motion. She exhibits no edema.  Neurological: She is alert and oriented to person, place, and time. No cranial nerve deficit.  Skin: Skin is warm and dry.  Psychiatric: She has a normal mood and affect. Her speech is normal.    ED Course  Procedures (including critical care time)   Labs Reviewed  CBC  DIFFERENTIAL  BASIC METABOLIC PANEL  D-DIMER, QUANTITATIVE  TROPONIN I   No results found.   No diagnosis found.   Date: 08/03/2011  Rate:70  Rhythm: normal sinus rhythm  QRS Axis: normal  Intervals: normal  ST/T Wave abnormalities: normal  Conduction Disutrbances:none  Narrative Interpretation:   Old EKG Reviewed: none available    MDM  Shortness of breath and chest pain. Recently treated for bronchitis. Nonfocal lung examination. X-ray and lab work ordered. D-dimer is been    ordered. EKG is reassuring. Care his been turned over to Dr. Rae Mar R. Rubin Payor, MD 08/03/11 [phone]

## 2011-08-03 NOTE — ED Notes (Signed)
New shift. Labs being drawn. Pt sitting in chair, states she feels better sitting up

## 2011-08-03 NOTE — ED Notes (Signed)
Patient states she is being treated by her PCP for bronchitis. She has finished all her medications and is short of breath with chest pain off and on for 2 weeks. States PCP told her if it didn't get any better to come to ED.

## 2012-03-14 ENCOUNTER — Ambulatory Visit: Payer: Self-pay | Admitting: Internal Medicine

## 2012-03-14 LAB — CBC CANCER CENTER
Basophil %: 0.2 %
Eosinophil %: 1.4 %
HCT: 42.7 % (ref 35.0–47.0)
HGB: 13.6 g/dL (ref 12.0–16.0)
Lymphocyte %: 74.1 %
MCH: 29.3 pg (ref 26.0–34.0)
MCHC: 31.8 g/dL — ABNORMAL LOW (ref 32.0–36.0)
Monocyte %: 2.8 %
Neutrophil #: 4.5 x10 3/mm (ref 1.4–6.5)
Neutrophil %: 21.5 %
Platelet: 180 x10 3/mm (ref 150–440)
RBC: 4.65 10*6/uL (ref 3.80–5.20)
WBC: 21.2 x10 3/mm — ABNORMAL HIGH (ref 3.6–11.0)

## 2012-03-14 LAB — CREATININE, SERUM
EGFR (African American): >60
EGFR (Non-African Amer.): >60

## 2012-03-14 LAB — LACTATE DEHYDROGENASE: LDH: 149 U/L (ref 81–234)

## 2012-03-17 LAB — PROT IMMUNOELECTROPHORES(ARMC)

## 2012-03-30 ENCOUNTER — Ambulatory Visit: Payer: Self-pay | Admitting: Internal Medicine

## 2012-04-29 ENCOUNTER — Ambulatory Visit: Payer: Self-pay | Admitting: Oncology

## 2012-05-30 ENCOUNTER — Ambulatory Visit: Payer: Self-pay | Admitting: Oncology

## 2012-06-04 ENCOUNTER — Ambulatory Visit: Payer: Self-pay

## 2013-02-27 ENCOUNTER — Ambulatory Visit: Payer: Self-pay | Admitting: Oncology

## 2013-03-30 ENCOUNTER — Ambulatory Visit: Payer: Self-pay | Admitting: Oncology

## 2013-10-13 ENCOUNTER — Ambulatory Visit: Payer: Self-pay | Admitting: Oncology

## 2013-10-28 ENCOUNTER — Ambulatory Visit: Payer: Self-pay | Admitting: Oncology

## 2014-02-01 ENCOUNTER — Ambulatory Visit: Payer: Self-pay | Admitting: Oncology

## 2014-02-27 ENCOUNTER — Ambulatory Visit: Payer: Self-pay | Admitting: Oncology

## 2014-04-19 ENCOUNTER — Ambulatory Visit: Payer: Self-pay | Admitting: Oncology

## 2014-04-29 ENCOUNTER — Ambulatory Visit: Payer: Self-pay | Admitting: Oncology

## 2015-03-09 ENCOUNTER — Inpatient Hospital Stay: Payer: Self-pay | Attending: Oncology | Admitting: Oncology

## 2015-03-09 ENCOUNTER — Encounter (INDEPENDENT_AMBULATORY_CARE_PROVIDER_SITE_OTHER): Payer: Self-pay

## 2015-03-09 VITALS — BP 122/77 | HR 64 | Temp 97.1°F | Resp 16 | Wt 234.3 lb

## 2015-03-09 DIAGNOSIS — I10 Essential (primary) hypertension: Secondary | ICD-10-CM

## 2015-03-09 DIAGNOSIS — I4891 Unspecified atrial fibrillation: Secondary | ICD-10-CM

## 2015-03-09 DIAGNOSIS — E669 Obesity, unspecified: Secondary | ICD-10-CM

## 2015-03-09 DIAGNOSIS — C9110 Chronic lymphocytic leukemia of B-cell type not having achieved remission: Secondary | ICD-10-CM

## 2015-03-09 DIAGNOSIS — Z79899 Other long term (current) drug therapy: Secondary | ICD-10-CM

## 2015-03-09 DIAGNOSIS — D509 Iron deficiency anemia, unspecified: Secondary | ICD-10-CM

## 2015-03-09 DIAGNOSIS — R109 Unspecified abdominal pain: Secondary | ICD-10-CM

## 2015-03-09 DIAGNOSIS — E785 Hyperlipidemia, unspecified: Secondary | ICD-10-CM

## 2015-03-09 NOTE — Progress Notes (Signed)
Patient has been abdominal pain and was evaluated by surgeon because she thought the pain waws from a hernia.  The surgeon did not think it was due to hernia and wanted her to have imaging since she is due for imaging for her CLL she is here for f/u.

## 2015-03-10 NOTE — Progress Notes (Signed)
[facility]  [phone] [phone]  ID: Daisy Black OB: 1950-10-25  MR#: [id number]  YTK#:[id number]  Patient Care Team: Rodena Medin, FNP as PCP - General (Nurse Practitioner) Aviva Signs, MD (General Surgery)  CHIEF COMPLAINT:  Chief Complaint  Patient presents with  . Follow-up    CLL    INTERVAL HISTORY: Patient last evaluated in September 2015. She returns to clinic today for consideration of repeat imaging with complaints of increasing, intermittent abdominal pain. She otherwise feels well. She has no fevers, chills, or night sweats. She has a good appetite and denies weight loss. She has no chest pain or shortness of breath. She denies any nausea, vomiting, constipation, or diarrhea. She has no melanoma or hematochezia. Patient otherwise feels well and offers no further specific complaints.  REVIEW OF SYSTEMS:   Review of Systems  Constitutional: Negative for fever, weight loss and malaise/fatigue.  Respiratory: Negative.   Cardiovascular: Negative.   Gastrointestinal: Positive for abdominal pain. Negative for nausea, diarrhea and constipation.  Musculoskeletal: Negative.   Neurological: Negative for weakness.    As per HPI. Otherwise, a complete review of systems is negatve.  PAST MEDICAL HISTORY: Past Medical History  Diagnosis Date  . Hypertension   . Hyperlipidemia   . Obesity   . Atrial fibrillation 2 vessel and    Atrial fibrillation-onset in 09/2009; DC cardioversion in 10/2009  . Lymphocytosis   . TSH elevation     PAST SURGICAL HISTORY: Past Surgical History  Procedure Laterality Date  . Hernia repair  2011    Incisional and umbilical utilizing mesh  . Cholecystectomy    . Rotator cuff repair    . Ankle surgery      Right  . Tonsillectomy      FAMILY HISTORY Family History  Problem Relation Age of Onset  . Cancer Mother 66    secondary to ovarian cancer  . Stroke Father 23    brain stem  infarction       ADVANCED DIRECTIVES:    HEALTH MAINTENANCE: Social History  Substance Use Topics  . Smoking status: Never Smoker   . Smokeless tobacco: Never Used  . Alcohol Use: No     Colonoscopy:  PAP:  Bone density:  Lipid panel:  No Known Allergies  Current Outpatient Prescriptions  Medication Sig Dispense Refill  . acetaminophen (TYLENOL) 650 MG CR tablet Take 650 mg by mouth every 8 (eight) hours as needed.      Marland Kitchen aspirin 81 MG tablet Take 81 mg by mouth daily.      Marland Kitchen atenolol (TENORMIN) 25 MG tablet Take 25 mg by mouth as needed. If atrial fib recurs and only palpitations may take 25mg  , if no betterin 1 hr take an additional 25mg  ; if no better call our office    . Calcium Carbonate-Vitamin D (CALCIUM 500/D) 500-125 MG-UNIT TABS Take 1 tablet by mouth daily.      . fluticasone (VERAMYST) 27.5 MCG/SPRAY nasal spray 2 sprays by Nasal route as needed.      Marland Kitchen levothyroxine (SYNTHROID, LEVOTHROID) 112 MCG tablet Take 124 mcg by mouth daily.     Marland Kitchen lisinopril (PRINIVIL,ZESTRIL) 20 MG tablet Take 20 mg by mouth daily.      Marland Kitchen losartan (COZAAR) 50 MG tablet Take 50 mg by mouth daily.      . Omega-3 Fatty Acids (SALMON OIL) 200 MG CAPS Take 1 capsule by mouth daily.      . traMADol (ULTRAM) 50  MG tablet Take 50 mg by mouth daily.      Marland Kitchen albuterol (PROVENTIL HFA;VENTOLIN HFA) 108 (90 BASE) MCG/ACT inhaler Inhale 1-2 puffs into the lungs every 6 (six) hours as needed for wheezing. 1 Inhaler 0  . albuterol (PROVENTIL) (2.5 MG/3ML) 0.083% nebulizer solution Take 3 mLs (2.5 mg total) by nebulization every 6 (six) hours as needed for wheezing. 75 mL 12   No current facility-administered medications for this visit.    OBJECTIVE: Filed Vitals:   03/09/15 1444  BP: 122/77  Pulse: 64  Temp: 97.1 F (36.2 C)  Resp: 16     Body mass index is 36.7 kg/(m^2).    ECOG FS:0 - Asymptomatic  General: Well-developed, well-nourished, no acute distress. Eyes: Pink conjunctiva, anicteric  sclera. Lungs: Clear to auscultation bilaterally. Heart: Regular rate and rhythm. No rubs, murmurs, or gallops. Abdomen: Soft, nontender, nondistended. No organomegaly noted, normoactive bowel sounds. Musculoskeletal: No edema, cyanosis, or clubbing. Neuro: Alert, answering all questions appropriately. Cranial nerves grossly intact. Skin: No rashes or petechiae noted. Psych: Normal affect.  LAB RESULTS:  Lab Results  Component Value Date   NA 140 08/03/2011   K 3.9 08/03/2011   CL 106 08/03/2011   CO2 30 08/03/2011   GLUCOSE 96 08/03/2011   BUN 14 08/03/2011   CREATININE 0.76 03/14/2012   CALCIUM 11.3* 08/03/2011   PROT 5.9* 10/10/2009   ALBUMIN 3.4* 10/10/2009   AST 21 10/10/2009   ALT 17 10/10/2009   ALKPHOS 91 10/10/2009   BILITOT 0.6 10/10/2009   GFRNONAA >60 03/14/2012   GFRAA >60 03/14/2012    Lab Results  Component Value Date   WBC 21.2* 03/14/2012   NEUTROABS 4.5 03/14/2012   HGB 13.6 03/14/2012   HCT 42.7 03/14/2012   MCV 92 03/14/2012   PLT 180 03/14/2012     STUDIES: No results found.  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  PLAN:    1. CLL: Patient's most recent white blood cell count from outside facility is 19, which is essentially unchanged. CT scan completed on April 09, 2014 revealed splenomegaly, as well as axillary, mesenteric, and peritoneal lymphadenopathy that was unchanged from previous. Given her increasing abdominal pain, will repeat CT scan to assess for interval change. Patient has requested this be done at an outside facility. She has also requested that no follow-up and be scheduled unless there is concern or need for treatment.  2. Iron deficiency anemia: Patient's hemoglobin and iron stores are now within normal limits.   Patient expressed understanding and was in agreement with this plan. She also understands that She can call clinic at any time with any questions, concerns, or complaints.      Lloyd Huger, MD   03/10/2015 9:01 AM

## 2015-03-22 ENCOUNTER — Telehealth: Payer: Self-pay | Admitting: *Deleted

## 2015-03-22 NOTE — Telephone Encounter (Signed)
New order faxed to daughter at the same contact number.

## 2015-03-22 NOTE — Telephone Encounter (Signed)
Informed that prescription is ready to pick up Requests it be faxed to her at the same number as called [phone]

## 2015-03-22 NOTE — Telephone Encounter (Signed)
Needs a new order for CT that does not include the neck, unless Dr Grayland Ormond feels it is imperative that the neck be done, it will cost patient $500 out of pocket for it to be done

## 2015-06-17 ENCOUNTER — Encounter: Payer: Self-pay | Admitting: *Deleted

## 2015-08-03 ENCOUNTER — Encounter: Payer: Self-pay | Admitting: *Deleted

## 2015-08-03 ENCOUNTER — Ambulatory Visit
Admission: RE | Admit: 2015-08-03 | Discharge: 2015-08-03 | Disposition: A | Discharge status: Home / Self Care | Payer: Self-pay | Source: Ambulatory Visit | Attending: Oncology | Admitting: Oncology

## 2015-08-03 ENCOUNTER — Ambulatory Visit: Payer: Self-pay | Attending: Oncology | Admitting: *Deleted

## 2015-08-03 VITALS — BP 114/69 | HR 62 | Temp 96.0°F | Resp 18 | Ht 67.72 in | Wt 233.7 lb

## 2015-08-03 DIAGNOSIS — Z0000 Encounter for general adult medical examination without abnormal findings: Secondary | ICD-10-CM

## 2015-08-03 NOTE — Patient Instructions (Signed)
Gave patient hand-out, Women Staying Healthy, Active and Well from [facility], with education on breast health, pap smears, heart and colon health. 

## 2015-08-03 NOTE — Progress Notes (Signed)
Subjective:     Patient ID: Daisy Black, female   DOB: [birth year], 65 y.o.   MRN: [id number]  HPI   Review of Systems     Objective:   Physical Exam  Pulmonary/Chest: Right breast exhibits no inverted nipple, no mass, no nipple discharge, no skin change and no tenderness. Left breast exhibits no inverted nipple, no mass, no nipple discharge, no skin change and no tenderness.    Pigeon chest noted with raised sternum  Abdominal: There is no splenomegaly or hepatomegaly.    Genitourinary: Rectal exam shows no mass. No breast swelling, tenderness, discharge or bleeding. There is no rash, tenderness, lesion or injury on the right labia. There is no rash, tenderness, lesion or injury on the left labia. Uterus is not deviated, not enlarged, not fixed and not tender. Cervix exhibits no motion tenderness, no discharge and no friability. Right adnexum displays no mass, no tenderness and no fullness. Left adnexum displays no mass and no tenderness. No erythema, tenderness or bleeding in the vagina. No foreign body around the vagina. No signs of injury around the vagina. No vaginal discharge found.       Assessment:     65 year old White female presents to [facility] for clinical breast exam, pap smear and mammogram. Patient with a history of Leukemia.  She is currently under the care of Dr. Grayland Ormond.  Patient states she has not had any treatments.  Clinical breast exam unremarkable.  Patient complains left breast discomfort.  Non-targeted.  Denies caffeine intake,  back problems or muscle pain.  Taught self breast awareness.   Specimen collected for pap smear without difficulty.  Patient has been screened for eligibility.  She does not have any insurance, Medicare or Medicaid.  She also meets financial eligibility.  Hand-out given on the Affordable Care Act.    Plan:     Screening mammogram ordered.  Specimen sent to the lab.  Patient to call back if the left breast pain becomes more targeted, she  finds a mass, has nipple discharge or any other concerns.  She is agreeable.  Will follow-up per [facility] protocol.

## 2015-08-09 LAB — PAP LB AND HPV HIGH-RISK
HPV, HIGH-RISK: NEGATIVE
PAP Smear Comment: 0

## 2015-08-26 ENCOUNTER — Encounter: Payer: Self-pay | Admitting: *Deleted

## 2015-08-26 NOTE — Progress Notes (Signed)
Letter mailed to inform patient of her normal mammogram and pap smear.  Follow up with screening mammogram in one year and next pap in 5 years.  HSIS to [facility].

## 2015-12-21 ENCOUNTER — Ambulatory Visit: Payer: Self-pay | Admitting: Oncology

## 2015-12-22 ENCOUNTER — Encounter: Payer: Self-pay | Admitting: Gastroenterology

## 2015-12-28 ENCOUNTER — Inpatient Hospital Stay: Payer: Medicare Other | Attending: Oncology | Admitting: Oncology

## 2015-12-28 VITALS — BP 122/72 | HR 72 | Temp 98.1°F | Resp 16 | Wt 234.6 lb

## 2015-12-28 DIAGNOSIS — I4891 Unspecified atrial fibrillation: Secondary | ICD-10-CM | POA: Diagnosis not present

## 2015-12-28 DIAGNOSIS — Z8041 Family history of malignant neoplasm of ovary: Secondary | ICD-10-CM | POA: Insufficient documentation

## 2015-12-28 DIAGNOSIS — Z79899 Other long term (current) drug therapy: Secondary | ICD-10-CM | POA: Insufficient documentation

## 2015-12-28 DIAGNOSIS — D509 Iron deficiency anemia, unspecified: Secondary | ICD-10-CM | POA: Diagnosis not present

## 2015-12-28 DIAGNOSIS — R5383 Other fatigue: Secondary | ICD-10-CM | POA: Diagnosis not present

## 2015-12-28 DIAGNOSIS — E785 Hyperlipidemia, unspecified: Secondary | ICD-10-CM | POA: Insufficient documentation

## 2015-12-28 DIAGNOSIS — R5381 Other malaise: Secondary | ICD-10-CM | POA: Diagnosis not present

## 2015-12-28 DIAGNOSIS — C9110 Chronic lymphocytic leukemia of B-cell type not having achieved remission: Secondary | ICD-10-CM | POA: Diagnosis present

## 2015-12-28 DIAGNOSIS — I10 Essential (primary) hypertension: Secondary | ICD-10-CM | POA: Insufficient documentation

## 2015-12-28 DIAGNOSIS — Z87891 Personal history of nicotine dependence: Secondary | ICD-10-CM | POA: Diagnosis not present

## 2015-12-28 DIAGNOSIS — Z803 Family history of malignant neoplasm of breast: Secondary | ICD-10-CM

## 2015-12-28 DIAGNOSIS — R531 Weakness: Secondary | ICD-10-CM | POA: Diagnosis not present

## 2015-12-28 NOTE — Progress Notes (Signed)
Patient is here with her daughter to discuss possible iron infusion.  They bring a copy of labs that were drawn at another facility (copy give to MD) and they are concerned about her iron level.  She has been feeling fatigue, change in skin tone, and mentions the swollen lymphnodes in her neck being painful.

## 2016-01-02 ENCOUNTER — Telehealth: Payer: Self-pay | Admitting: *Deleted

## 2016-01-02 NOTE — Telephone Encounter (Signed)
Family wants patient to get Feraheme infusions there and they need you to call them to discuss what they need

## 2016-01-02 NOTE — Telephone Encounter (Signed)
States they need orders on their form and needs demographics, insurance card. and office note. She will fax orders to be signed and a list of what they need

## 2016-01-04 NOTE — Progress Notes (Signed)
[place]  [phone]) [phone] [phone]) [phone]  ID: Daisy Black OB: 06-09-1951  MR#: [id number]  [id number]  Patient Care Team: Pcp Not In System as PCP - General Aviva Signs, MD (General Surgery) Rico Junker, RN as Registered Nurse Theodore Demark, RN as Registered Nurse Danie Binder, MD as Consulting Physician (Gastroenterology)  CHIEF COMPLAINT:  No chief complaint on file.   INTERVAL HISTORY: Patient last evaluated in August 2016. She returns to clinic today for further evaluation and consideration of IV iron. She has increased weakness and fatigue. She otherwise feels well. She has no fevers, chills, or night sweats. She has a good appetite and denies weight loss. She has no chest pain or shortness of breath. She denies any nausea, vomiting, constipation, or diarrhea. She has no melanoma or hematochezia. Patient offers no further specific complaints.  REVIEW OF SYSTEMS:   Review of Systems  Constitutional: Positive for malaise/fatigue. Negative for fever, weight loss and diaphoresis.  Respiratory: Negative.  Negative for cough and shortness of breath.   Cardiovascular: Negative.  Negative for chest pain.  Gastrointestinal: Negative for nausea, abdominal pain, diarrhea and constipation.  Musculoskeletal: Negative.   Neurological: Positive for weakness.  Psychiatric/Behavioral: Negative.     As per HPI. Otherwise, a complete review of systems is negatve.  PAST MEDICAL HISTORY: Past Medical History  Diagnosis Date  . Hypertension   . Hyperlipidemia   . Obesity   . Atrial fibrillation ([place]) 2 vessel and    Atrial fibrillation-onset in 09/2009; DC cardioversion in 10/2009  . Lymphocytosis   . TSH elevation   . Cancer ([place])     Leukemia    PAST SURGICAL HISTORY: Past Surgical History  Procedure Laterality Date  . Hernia repair  2011    Incisional and umbilical utilizing mesh  . Cholecystectomy    . Rotator cuff repair    .  Ankle surgery      Right  . Tonsillectomy      FAMILY HISTORY Family History  Problem Relation Age of Onset  . Ovarian cancer Mother 55    secondary to ovarian cancer  . Leukemia Mother   . Stroke Father 63    brain stem infarction  . Breast cancer Paternal Aunt        ADVANCED DIRECTIVES:    HEALTH MAINTENANCE: Social History  Substance Use Topics  . Smoking status: Former Smoker -- 0.25 packs/day for 2 years    Types: Cigarettes    Quit date: 08/03/1975  . Smokeless tobacco: Never Used  . Alcohol Use: No     Colonoscopy:  PAP:  Bone density:  Lipid panel:  No Known Allergies  Current Outpatient Prescriptions  Medication Sig Dispense Refill  . acetaminophen (TYLENOL) 650 MG CR tablet Take 650 mg by mouth every 8 (eight) hours as needed.      Marland Kitchen aspirin 81 MG tablet Take 81 mg by mouth daily.      Marland Kitchen atenolol (TENORMIN) 25 MG tablet Take 25 mg by mouth as needed. If atrial fib recurs and only palpitations may take 25mg  , if no betterin 1 hr take an additional 25mg  ; if no better call our office    . Calcium Carbonate-Vitamin D (CALCIUM 500/D) 500-125 MG-UNIT TABS Take 1 tablet by mouth daily.      . Ferrous Fumarate (IRON) 18 MG TBCR Take by mouth daily.    . fluticasone (VERAMYST) 27.5 MCG/SPRAY nasal spray 2 sprays by Nasal route as needed.      Marland Kitchen  levothyroxine (SYNTHROID, LEVOTHROID) 112 MCG tablet Take 124 mcg by mouth daily.     Marland Kitchen lisinopril (PRINIVIL,ZESTRIL) 20 MG tablet Take 20 mg by mouth daily.      Marland Kitchen losartan (COZAAR) 50 MG tablet Take 50 mg by mouth daily.      . Omega-3 Fatty Acids (SALMON OIL) 200 MG CAPS Take 1 capsule by mouth daily.      . traMADol (ULTRAM) 50 MG tablet Take 50 mg by mouth daily.      Marland Kitchen albuterol (PROVENTIL HFA;VENTOLIN HFA) 108 (90 BASE) MCG/ACT inhaler Inhale 1-2 puffs into the lungs every 6 (six) hours as needed for wheezing. 1 Inhaler 0  . albuterol (PROVENTIL) (2.5 MG/3ML) 0.083% nebulizer solution Take 3 mLs (2.5 mg total) by  nebulization every 6 (six) hours as needed for wheezing. 75 mL 12   No current facility-administered medications for this visit.    OBJECTIVE: Filed Vitals:   12/28/15 1622  BP: 122/72  Pulse: 72  Temp: 98.1 F (36.7 C)  Resp: 16     Body mass index is 35.97 kg/(m^2).    ECOG FS:0 - Asymptomatic  General: Well-developed, well-nourished, no acute distress. Eyes: Pink conjunctiva, anicteric sclera. Lungs: Clear to auscultation bilaterally. Heart: Regular rate and rhythm. No rubs, murmurs, or gallops. Abdomen: Soft, nontender, nondistended. No organomegaly noted, normoactive bowel sounds. Musculoskeletal: No edema, cyanosis, or clubbing. Neuro: Alert, answering all questions appropriately. Cranial nerves grossly intact. Skin: No rashes or petechiae noted. Psych: Normal affect.  LAB RESULTS:  Lab Results  Component Value Date   NA 140 08/03/2011   K 3.9 08/03/2011   CL 106 08/03/2011   CO2 30 08/03/2011   GLUCOSE 96 08/03/2011   BUN 14 08/03/2011   CREATININE 0.76 03/14/2012   CALCIUM 11.3* 08/03/2011   PROT 5.9* 10/10/2009   ALBUMIN 3.4* 10/10/2009   AST 21 10/10/2009   ALT 17 10/10/2009   ALKPHOS 91 10/10/2009   BILITOT 0.6 10/10/2009   GFRNONAA >60 03/14/2012   GFRAA >60 03/14/2012    Lab Results  Component Value Date   WBC 21.2* 03/14/2012   NEUTROABS 4.5 03/14/2012   HGB 13.6 03/14/2012   HCT 42.7 03/14/2012   MCV 92 03/14/2012   PLT 180 03/14/2012     STUDIES: No results found.  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  PLAN:    1. CLL: Patient's most recent white blood cell count from outside facility on Dec 15, 2015 was reported as 20.58. CT scan results from March 24, 2015 reviewed independently and revealed a slight interval increase in the size of several lymph nodes in the axilla, mediastinum, and abdomen as compared to her previous study completed on April 09, 2014. No intervention is needed at this time.  Patient has once again requested only follow-up on an as-needed basis. 2. Iron deficiency anemia: Although patient's hemoglobin is 13.3, her iron saturation is decreased at 14% and she is mildly symptomatic. She was given a prescription of 510 mg IV Feraheme today given at an outside facility.   Approximately 30 minutes was spent in discussion of which greater than 50% was consultation.  Patient expressed understanding and was in agreement with this plan. She also understands that She can call clinic at any time with any questions, concerns, or complaints.     Lloyd Huger, MD   01/04/2016 10:08 PM

## 2016-01-12 ENCOUNTER — Ambulatory Visit: Payer: Self-pay | Admitting: Gastroenterology

## 2016-01-12 ENCOUNTER — Inpatient Hospital Stay (HOSPITAL_COMMUNITY): Payer: Medicare Other

## 2016-01-12 ENCOUNTER — Emergency Department (HOSPITAL_COMMUNITY): Payer: Medicare Other

## 2016-01-12 ENCOUNTER — Encounter (HOSPITAL_COMMUNITY): Payer: Self-pay | Admitting: *Deleted

## 2016-01-12 ENCOUNTER — Inpatient Hospital Stay (HOSPITAL_COMMUNITY)
Admission: EM | Admit: 2016-01-12 | Discharge: 2016-01-17 | DRG: 309 | Disposition: A | Discharge status: Home / Self Care | Payer: Medicare Other | Attending: Internal Medicine | Admitting: Internal Medicine

## 2016-01-12 DIAGNOSIS — Z803 Family history of malignant neoplasm of breast: Secondary | ICD-10-CM

## 2016-01-12 DIAGNOSIS — R0789 Other chest pain: Secondary | ICD-10-CM | POA: Diagnosis present

## 2016-01-12 DIAGNOSIS — I4891 Unspecified atrial fibrillation: Secondary | ICD-10-CM

## 2016-01-12 DIAGNOSIS — Z7982 Long term (current) use of aspirin: Secondary | ICD-10-CM

## 2016-01-12 DIAGNOSIS — I10 Essential (primary) hypertension: Secondary | ICD-10-CM | POA: Diagnosis present

## 2016-01-12 DIAGNOSIS — I5189 Other ill-defined heart diseases: Secondary | ICD-10-CM | POA: Diagnosis not present

## 2016-01-12 DIAGNOSIS — I481 Persistent atrial fibrillation: Secondary | ICD-10-CM | POA: Diagnosis not present

## 2016-01-12 DIAGNOSIS — C9110 Chronic lymphocytic leukemia of B-cell type not having achieved remission: Secondary | ICD-10-CM | POA: Diagnosis not present

## 2016-01-12 DIAGNOSIS — R079 Chest pain, unspecified: Secondary | ICD-10-CM | POA: Diagnosis present

## 2016-01-12 DIAGNOSIS — E785 Hyperlipidemia, unspecified: Secondary | ICD-10-CM | POA: Diagnosis not present

## 2016-01-12 DIAGNOSIS — D696 Thrombocytopenia, unspecified: Secondary | ICD-10-CM | POA: Diagnosis not present

## 2016-01-12 DIAGNOSIS — Z8041 Family history of malignant neoplasm of ovary: Secondary | ICD-10-CM | POA: Diagnosis not present

## 2016-01-12 DIAGNOSIS — D72829 Elevated white blood cell count, unspecified: Secondary | ICD-10-CM | POA: Diagnosis not present

## 2016-01-12 DIAGNOSIS — E669 Obesity, unspecified: Secondary | ICD-10-CM | POA: Diagnosis present

## 2016-01-12 DIAGNOSIS — Z806 Family history of leukemia: Secondary | ICD-10-CM | POA: Diagnosis not present

## 2016-01-12 DIAGNOSIS — Z823 Family history of stroke: Secondary | ICD-10-CM

## 2016-01-12 DIAGNOSIS — E039 Hypothyroidism, unspecified: Secondary | ICD-10-CM | POA: Diagnosis present

## 2016-01-12 DIAGNOSIS — Z87891 Personal history of nicotine dependence: Secondary | ICD-10-CM | POA: Diagnosis not present

## 2016-01-12 DIAGNOSIS — Z6835 Body mass index (BMI) 35.0-35.9, adult: Secondary | ICD-10-CM

## 2016-01-12 HISTORY — DX: Essential (primary) hypertension: I10

## 2016-01-12 HISTORY — DX: Chronic lymphocytic leukemia of B-cell type not having achieved remission: C91.10

## 2016-01-12 LAB — ECHOCARDIOGRAM COMPLETE
CHL CUP MV DEC (S): 224
E decel time: 224 msec
EERAT: 10.48
FS: 33 % (ref 28–44)
HEIGHTINCHES: 67.5 in
IV/PV OW: 0.91
LA ID, A-P, ES: 43 mm
LAVOL: 41.7 mL
LAVOLA4C: 41.4 mL
LDCA: 3.14 cm2
LEFT ATRIUM END SYS DIAM: 43 mm
LV E/e' medial: 10.48
LV E/e'average: 10.48
LV e' LATERAL: 10.4 cm/s
LVOT diameter: 20 mm
MVPG: 5 mmHg
MVPKEVEL: 109 m/s
PW: 11.8 mm — AB (ref 0.6–1.1)
TAPSE: 16 mm
TDI e' lateral: 10.4
TDI e' medial: 9.46
Weight: 3724.89 oz

## 2016-01-12 LAB — BASIC METABOLIC PANEL
Anion gap: 3 — ABNORMAL LOW (ref 5–15)
BUN: 17 mg/dL (ref 6–20)
CALCIUM: 10 mg/dL (ref 8.9–10.3)
CHLORIDE: 113 mmol/L — AB (ref 101–111)
CO2: 25 mmol/L (ref 22–32)
Creatinine, Ser: 0.59 mg/dL (ref 0.44–1.00)
GFR calc non Af Amer: >60 mL/min (ref >60)
GLUCOSE: 112 mg/dL — AB (ref 65–99)
Potassium: 4.1 mmol/L (ref 3.5–5.1)
SODIUM: 141 mmol/L (ref 135–145)

## 2016-01-12 LAB — MRSA PCR SCREENING: MRSA by PCR: NEGATIVE

## 2016-01-12 LAB — CBC
HCT: 41.6 % (ref 36.0–46.0)
Hemoglobin: 13.5 g/dL (ref 12.0–15.0)
MCH: 29.9 pg (ref 26.0–34.0)
MCHC: 32.5 g/dL (ref 30.0–36.0)
MCV: 92.2 fL (ref 78.0–100.0)
PLATELETS: 83 10*3/uL — AB (ref 150–400)
RBC: 4.51 MIL/uL (ref 3.87–5.11)
RDW: 15.7 % — AB (ref 11.5–15.5)
WBC: 21.2 10*3/uL — AB (ref 4.0–10.5)

## 2016-01-12 LAB — URINALYSIS, ROUTINE W REFLEX MICROSCOPIC
Bilirubin Urine: NEGATIVE
GLUCOSE, UA: NEGATIVE mg/dL
Hgb urine dipstick: NEGATIVE
Ketones, ur: NEGATIVE mg/dL
NITRITE: NEGATIVE
PROTEIN: NEGATIVE mg/dL
Specific Gravity, Urine: <1.005 — ABNORMAL LOW (ref 1.005–1.030)
pH: 6.5 (ref 5.0–8.0)

## 2016-01-12 LAB — TROPONIN I
Troponin I: <0.03 ng/mL (ref <0.031)
Troponin I: <0.03 ng/mL (ref <0.031)

## 2016-01-12 LAB — URINE MICROSCOPIC-ADD ON
BACTERIA UA: NONE SEEN
RBC / HPF: NONE SEEN RBC/hpf (ref 0–5)

## 2016-01-12 LAB — TSH
TSH: 3.353 u[IU]/mL (ref 0.350–4.500)
TSH: 1.986 u[IU]/mL (ref 0.350–4.500)

## 2016-01-12 LAB — MAGNESIUM
Magnesium: 2.2 mg/dL (ref 1.7–2.4)
Magnesium: 2.2 mg/dL (ref 1.7–2.4)

## 2016-01-12 MED ORDER — SODIUM CHLORIDE 0.9 % IV SOLN: 250.0000 mL | INTRAVENOUS | Status: DC | PRN

## 2016-01-12 MED ORDER — DILTIAZEM LOAD VIA INFUSION
10.0000 mg | Freq: Once | INTRAVENOUS | Status: AC
  Administered 2016-01-12: 10 mg via INTRAVENOUS

## 2016-01-12 MED ORDER — ACETAMINOPHEN 325 MG PO TABS
650.0000 mg | ORAL_TABLET | Freq: Four times a day (QID) | ORAL | Status: DC | PRN
  Administered 2016-01-15: 650 mg via ORAL
  Filled 2016-01-12: qty 2

## 2016-01-12 MED ORDER — SODIUM CHLORIDE 0.9% FLUSH
3.0000 mL | Freq: Two times a day (BID) | INTRAVENOUS | Status: DC
  Administered 2016-01-12 – 2016-01-16 (×7): 3 mL via INTRAVENOUS

## 2016-01-12 MED ORDER — PANTOPRAZOLE SODIUM 40 MG PO TBEC
40.0000 mg | DELAYED_RELEASE_TABLET | Freq: Every day | ORAL | Status: DC
  Administered 2016-01-12 – 2016-01-16 (×5): 40 mg via ORAL
  Filled 2016-01-12 (×5): qty 1

## 2016-01-12 MED ORDER — DEXTROSE 5 % IV SOLN
5.0000 mg/h | INTRAVENOUS | Status: DC
  Administered 2016-01-12: 7.5 mg/h via INTRAVENOUS
  Administered 2016-01-12: 7 mg/h via INTRAVENOUS
  Administered 2016-01-12: 5 mg/h via INTRAVENOUS
  Filled 2016-01-12 (×2): qty 100

## 2016-01-12 MED ORDER — ONDANSETRON HCL 4 MG/2ML IJ SOLN: 4.0000 mg | Freq: Four times a day (QID) | INTRAMUSCULAR | Status: DC | PRN

## 2016-01-12 MED ORDER — ASPIRIN 81 MG PO CHEW
81.0000 mg | CHEWABLE_TABLET | Freq: Every day | ORAL | Status: DC
  Administered 2016-01-13: 81 mg via ORAL
  Filled 2016-01-12 (×4): qty 1

## 2016-01-12 MED ORDER — APIXABAN 5 MG PO TABS
5.0000 mg | ORAL_TABLET | Freq: Two times a day (BID) | ORAL | Status: DC
  Administered 2016-01-12 – 2016-01-17 (×11): 5 mg via ORAL
  Filled 2016-01-12 (×12): qty 1

## 2016-01-12 MED ORDER — OMEGA-3-ACID ETHYL ESTERS 1 G PO CAPS
1.0000 g | ORAL_CAPSULE | Freq: Two times a day (BID) | ORAL | Status: DC
  Administered 2016-01-12 – 2016-01-17 (×11): 1 g via ORAL
  Filled 2016-01-12 (×11): qty 1

## 2016-01-12 MED ORDER — ACETAMINOPHEN 650 MG RE SUPP: 650.0000 mg | Freq: Four times a day (QID) | RECTAL | Status: DC | PRN

## 2016-01-12 MED ORDER — NITROGLYCERIN 0.4 MG SL SUBL
0.4000 mg | SUBLINGUAL_TABLET | SUBLINGUAL | Status: DC | PRN
  Administered 2016-01-12 (×2): 0.4 mg via SUBLINGUAL
  Filled 2016-01-12 (×2): qty 1

## 2016-01-12 MED ORDER — ASPIRIN 81 MG PO CHEW
324.0000 mg | CHEWABLE_TABLET | Freq: Once | ORAL | Status: AC
  Administered 2016-01-12: 324 mg via ORAL
  Filled 2016-01-12: qty 4

## 2016-01-12 MED ORDER — ONDANSETRON HCL 4 MG PO TABS: 4.0000 mg | ORAL_TABLET | Freq: Four times a day (QID) | ORAL | Status: DC | PRN

## 2016-01-12 MED ORDER — METOPROLOL TARTRATE 25 MG PO TABS
25.0000 mg | ORAL_TABLET | Freq: Two times a day (BID) | ORAL | Status: DC
  Administered 2016-01-12 – 2016-01-16 (×10): 25 mg via ORAL
  Filled 2016-01-12 (×10): qty 1

## 2016-01-12 MED ORDER — SODIUM CHLORIDE 0.9% FLUSH: 3.0000 mL | INTRAVENOUS | Status: DC | PRN

## 2016-01-12 MED ORDER — LEVOTHYROXINE SODIUM 25 MCG PO TABS
125.0000 ug | ORAL_TABLET | Freq: Every day | ORAL | Status: DC
  Administered 2016-01-13 – 2016-01-17 (×5): 125 ug via ORAL
  Filled 2016-01-12 (×5): qty 1

## 2016-01-12 MED ORDER — DILTIAZEM HCL 100 MG IV SOLR
INTRAVENOUS | Status: AC
  Filled 2016-01-12: qty 100

## 2016-01-12 MED ORDER — SODIUM CHLORIDE 0.9 % IV BOLUS (SEPSIS)
1000.0000 mL | Freq: Once | INTRAVENOUS | Status: AC
  Administered 2016-01-12: 1000 mL via INTRAVENOUS

## 2016-01-12 MED ORDER — SENNOSIDES-DOCUSATE SODIUM 8.6-50 MG PO TABS: 1.0000 | ORAL_TABLET | Freq: Every evening | ORAL | Status: DC | PRN

## 2016-01-12 NOTE — Progress Notes (Signed)
*  PRELIMINARY RESULTS* Echocardiogram 2D Echocardiogram has been performed.  Daisy Black 01/12/2016, 4:55 PM

## 2016-01-12 NOTE — H&P (Signed)
History and Physical    Daisy Black [id number] DOB: March 28, 1951 DOA: 01/12/2016  Referring MD/NP/PA: Pryor Curia, D.O. PCP: Pcp Not In System  Patient coming from: Home  Chief Complaint: Chest pain, palpitations  HPI: Daisy Black is a 65 y.o. female with a history of atrial fibrillation that converted after cardioversion approximately 5 years ago, has not had further recurrences of A. fib to her knowledge. During the past 3 weeks she has had episodes of feeling a little dizzy and a fluttering in her chest. Today she decided to come to the hospital because of chest discomfort and palpitations. She was found to be in atrial fibrillation with rapid ventricular response with a rate of about 140-150. She was given a Cardizem bolus in the ED and placed on an IV Cardizem drip, currently her rates have dropped into the 80-90 range. We have been asked to admit her for further evaluation and management.  Past Medical/Surgical History: Past Medical History  Diagnosis Date  . Hypertension   . Hyperlipidemia   . Obesity   . Atrial fibrillation ([place]) 2 vessel and    Atrial fibrillation-onset in 09/2009; DC cardioversion in 10/2009  . Lymphocytosis   . TSH elevation   . Cancer [facility])     Leukemia    Past Surgical History  Procedure Laterality Date  . Hernia repair  2011    Incisional and umbilical utilizing mesh  . Cholecystectomy    . Rotator cuff repair    . Ankle surgery      Right  . Tonsillectomy     Social History:  reports that she quit smoking about 40 years ago. Her smoking use included Cigarettes. She has a .5 pack-year smoking history. She has never used smokeless tobacco. She reports that she does not drink alcohol or use illicit drugs.  Allergies: No Known Allergies  Family History:  Family History  Problem Relation Age of Onset  . Ovarian cancer Mother 46    secondary to ovarian cancer  . Leukemia Mother   . Stroke Father 64    brain stem infarction  .  Breast cancer Paternal Aunt     Prior to Admission medications   Medication Sig Start Date End Date Taking? Authorizing Provider  acetaminophen (TYLENOL) 650 MG CR tablet Take 650 mg by mouth every 8 (eight) hours as needed.     Yes Historical Provider, MD  aspirin 81 MG tablet Take 81 mg by mouth daily.     Yes Historical Provider, MD  atenolol (TENORMIN) 25 MG tablet Take 25 mg by mouth as needed. If atrial fib recurs and only palpitations may take 25mg  , if no betterin 1 hr take an additional 25mg  ; if no better call our office   Yes Historical Provider, MD  famotidine (PEPCID) 20 MG tablet Take 20 mg by mouth 2 (two) times daily.   Yes Historical Provider, MD  Ferrous Fumarate (IRON) 18 MG TBCR Take by mouth daily.   Yes Historical Provider, MD  lansoprazole (PREVACID) 15 MG capsule Take 15 mg by mouth daily at 12 noon.   Yes Historical Provider, MD  levothyroxine (SYNTHROID, LEVOTHROID) 125 MCG tablet Take 125 mcg by mouth daily before breakfast.   Yes Historical Provider, MD  Omega-3 Fatty Acids (SALMON OIL PO) Take 1 capsule by mouth daily.   Yes Historical Provider, MD  Omega-3 Fatty Acids (SALMON OIL) 200 MG CAPS Take 1 capsule by mouth daily.     Yes Historical Provider, MD  Probiotic Product (PROBIOTIC PO) Take 1 capsule by mouth daily.   Yes Historical Provider, MD    Review of Systems:  Constitutional: Denies fever, chills, diaphoresis, appetite change and fatigue.  HEENT: Denies photophobia, eye pain, redness, hearing loss, ear pain, congestion, sore throat, rhinorrhea, sneezing, mouth sores, trouble swallowing, neck pain, neck stiffness and tinnitus.   Respiratory: Denies SOB, DOE, cough, chest tightness,  and wheezing.   Cardiovascular: Denies chest pain,  and leg swelling.  Gastrointestinal: Denies nausea, vomiting, abdominal pain, diarrhea, constipation, blood in stool and abdominal distention.  Genitourinary: Denies dysuria, urgency, frequency, hematuria, flank pain and  difficulty urinating.  Endocrine: Denies: hot or cold intolerance, sweats, changes in hair or nails, polyuria, polydipsia. Musculoskeletal: Denies myalgias, back pain, joint swelling, arthralgias and gait problem.  Skin: Denies pallor, rash and wound.  Neurological: Denies dizziness, seizures, syncope, weakness, light-headedness, numbness and headaches.  Hematological: Denies adenopathy. Easy bruising, personal or family bleeding history  Psychiatric/Behavioral: Denies suicidal ideation, mood changes, confusion, nervousness, sleep disturbance and agitation    Physical Exam: Filed Vitals:   01/12/16 1015 01/12/16 1030 01/12/16 1045 01/12/16 1100  BP: 116/72 107/61 100/47   Pulse: 88 86 76 81  Temp:      TempSrc:      Resp: 25 14 19 16   Height:      Weight:      SpO2: 98% 96% 96% 97%     Constitutional: NAD, calm, comfortable Eyes: PERRL, lids and conjunctivae normal ENMT: Mucous membranes are moist. Posterior pharynx clear of any exudate or lesions.Normal dentition.  Neck: normal, supple, no masses, no thyromegaly Respiratory: clear to auscultation bilaterally, no wheezing, no crackles. Normal respiratory effort. No accessory muscle use.  Cardiovascular: irregular rate and rhythm, no murmurs / rubs / gallops. No extremity edema. 2+ pedal pulses. No carotid bruits.  Abdomen: no tenderness, no masses palpated. No hepatosplenomegaly. Bowel sounds positive.  Musculoskeletal: no clubbing / cyanosis. No joint deformity upper and lower extremities. Good ROM, no contractures. Normal muscle tone.  Skin: no rashes, lesions, ulcers. No induration Neurologic: CN 2-12 grossly intact. Sensation intact, DTR normal. Strength 5/5 in all 4.  Psychiatric: Normal judgment and insight. Alert and oriented x 3. Normal mood.    Labs on Admission: I have personally reviewed the following labs and imaging studies  CBC:  Recent Labs Lab 01/12/16 0545  WBC 21.2*  HGB 13.5  HCT 41.6  MCV 92.2  PLT  83*   Basic Metabolic Panel:  Recent Labs Lab 01/12/16 0545  NA 141  K 4.1  CL 113*  CO2 25  GLUCOSE 112*  BUN 17  CREATININE 0.59  CALCIUM 10.0  MG 2.2   GFR: Estimated Creatinine Clearance: 88.4 mL/min (by C-G formula based on Cr of 0.59). Liver Function Tests: No results for input(s): AST, ALT, ALKPHOS, BILITOT, PROT, ALBUMIN in the last 168 hours. No results for input(s): LIPASE, AMYLASE in the last 168 hours. No results for input(s): AMMONIA in the last 168 hours. Coagulation Profile: No results for input(s): INR, PROTIME in the last 168 hours. Cardiac Enzymes:  Recent Labs Lab 01/12/16 0545  TROPONINI <0.03   BNP (last 3 results) No results for input(s): PROBNP in the last 8760 hours. HbA1C: No results for input(s): HGBA1C in the last 72 hours. CBG: No results for input(s): GLUCAP in the last 168 hours. Lipid Profile: No results for input(s): CHOL, HDL, LDLCALC, TRIG, CHOLHDL, LDLDIRECT in the last 72 hours. Thyroid Function Tests:  Recent Labs  01/12/16 0546  TSH 3.353   Anemia Panel: No results for input(s): VITAMINB12, FOLATE, FERRITIN, TIBC, IRON, RETICCTPCT in the last 72 hours. Urine analysis:    Component Value Date/Time   COLORURINE YELLOW 01/12/2016 0636   APPEARANCEUR CLEAR 01/12/2016 0636   LABSPEC <1.005* 01/12/2016 0636   PHURINE 6.5 01/12/2016 0636   GLUCOSEU NEGATIVE 01/12/2016 0636   HGBUR NEGATIVE 01/12/2016 0636   BILIRUBINUR NEGATIVE 01/12/2016 0636   KETONESUR NEGATIVE 01/12/2016 0636   PROTEINUR NEGATIVE 01/12/2016 0636   NITRITE NEGATIVE 01/12/2016 0636   LEUKOCYTESUR TRACE* 01/12/2016 0636   Sepsis Labs: @LABRCNTIP (procalcitonin:4,lacticidven:4) )No results found for this or any previous visit (from the past 240 hour(s)).   Radiological Exams on Admission: Dg Chest 2 View  01/12/2016  CLINICAL DATA:  Chest pain with rapid heart rate for several hours this morning. EXAM: CHEST  2 VIEW COMPARISON:  08/03/2011 FINDINGS:  Emphysematous changes and scattered fibrosis in the lungs. Normal heart size and pulmonary vascularity. No focal airspace disease or consolidation in the lungs. No blunting of costophrenic angles. No pneumothorax. Mediastinal contours appear intact. Degenerative changes in the spine. IMPRESSION: No active cardiopulmonary disease. Electronically Signed   By: Lucienne Capers M.D.   On: 01/12/2016 06:29    EKG: Independently reviewed. Atrial fibrillation at a rate of 118, no acute ischemic abnormalities  Assessment/Plan Principal Problem:   Atrial fibrillation with RVR (HCC) Active Problems:   Chest pain   Hyperlipidemia   Essential hypertension   Leukocytosis    Atrial fibrillation with rapid ventricular response -Rates improved on Cardizem drip, will try and transition off drip today. Metoprolol has been started. - Her CHADSVASC2 score is at least 3 on account of her age, sex and hypertension. She should be anticoagulated and will start Eliquis. -2-D echo has been requested. -Will request cardiology consultation. -She was previously symptomatic despite rate control and required cardioversion.  Chest pain -Likely secondary to A. fib with RVR, cycle troponins, repeat EKG in a.m.  Hyperlipidemia -Continue lovaza.  Hypertension -Currently controlled, on Lopressor.  Leukocytosis -Chest x-ray/urinalysis without signs of infection, may be reactive, monitor.   DVT prophylaxis: Eliquis  Code Status: Full code  Family Communication: Husband and daughter at bedside updated on plan of care  Disposition Plan: Anticipate discharge home in 2-3 days  Consults called: Cardiology  Admission status: Inpatient    Time Spent: 70 minutes  Lelon Frohlich MD [facility] Pager [phone]  If 7PM-7AM, please contact night-coverage [email or web address] Password [facility]  01/12/2016, 3:24 PM

## 2016-01-12 NOTE — ED Notes (Signed)
Pt reports her heart feeling like it is racing. Pt w/ history of A-fib.

## 2016-01-12 NOTE — ED Provider Notes (Addendum)
TIME SEEN: 5:30 AM  CHIEF COMPLAINT: Palpitations, chest pain  HPI: Pt is a 65 y.o. female with history of hypertension, hyperlipidemia, paroxysmal atrial fibrillation who was cardioverted in 2011, history of leukemia who presents to the emergency department with several weeks of feeling weak and lightheaded. States tonight around midnight she started feeling her heart racing and chest pressure. No shortness of breath. No vomiting diarrhea. No recent bloody stools, melena. No recent fevers, cough. She is not on anticoagulation. Does take an 81 mg aspirin daily. Is on atenolol and has not missed any doses of this medication.  PCP - [place]  ROS: See HPI Constitutional: no fever  Eyes: no drainage  ENT: no runny nose   Cardiovascular:   chest pain  Resp: no SOB  GI: no vomiting GU: no dysuria Integumentary: no rash  Allergy: no hives  Musculoskeletal: no leg swelling  Neurological: no slurred speech ROS otherwise negative  PAST MEDICAL HISTORY/PAST SURGICAL HISTORY:  Past Medical History  Diagnosis Date  . Hypertension   . Hyperlipidemia   . Obesity   . Atrial fibrillation ([place]) 2 vessel and    Atrial fibrillation-onset in 09/2009; DC cardioversion in 10/2009  . Lymphocytosis   . TSH elevation   . Cancer ([place])     Leukemia    MEDICATIONS:  Prior to Admission medications   Medication Sig Start Date End Date Taking? Authorizing Provider  acetaminophen (TYLENOL) 650 MG CR tablet Take 650 mg by mouth every 8 (eight) hours as needed.      Historical Provider, MD  albuterol (PROVENTIL HFA;VENTOLIN HFA) 108 (90 BASE) MCG/ACT inhaler Inhale 1-2 puffs into the lungs every 6 (six) hours as needed for wheezing. 08/03/11 08/02/12  Fredia Sorrow, MD  albuterol (PROVENTIL) (2.5 MG/3ML) 0.083% nebulizer solution Take 3 mLs (2.5 mg total) by nebulization every 6 (six) hours as needed for wheezing. 08/03/11 08/02/12  Fredia Sorrow, MD  aspirin 81 MG tablet Take 81 mg by mouth daily.      Historical  Provider, MD  atenolol (TENORMIN) 25 MG tablet Take 25 mg by mouth as needed. If atrial fib recurs and only palpitations may take 25mg  , if no betterin 1 hr take an additional 25mg  ; if no better call our office    Historical Provider, MD  Calcium Carbonate-Vitamin D (CALCIUM 500/D) 500-125 MG-UNIT TABS Take 1 tablet by mouth daily.      Historical Provider, MD  Ferrous Fumarate (IRON) 18 MG TBCR Take by mouth daily.    Historical Provider, MD  fluticasone (VERAMYST) 27.5 MCG/SPRAY nasal spray 2 sprays by Nasal route as needed.      Historical Provider, MD  levothyroxine (SYNTHROID, LEVOTHROID) 112 MCG tablet Take 124 mcg by mouth daily.     Historical Provider, MD  lisinopril (PRINIVIL,ZESTRIL) 20 MG tablet Take 20 mg by mouth daily.      Historical Provider, MD  losartan (COZAAR) 50 MG tablet Take 50 mg by mouth daily.      Historical Provider, MD  Omega-3 Fatty Acids (SALMON OIL) 200 MG CAPS Take 1 capsule by mouth daily.      Historical Provider, MD  traMADol (ULTRAM) 50 MG tablet Take 50 mg by mouth daily.      Historical Provider, MD    ALLERGIES:  No Known Allergies  SOCIAL HISTORY:  Social History  Substance Use Topics  . Smoking status: Former Smoker -- 0.25 packs/day for 2 years    Types: Cigarettes    Quit date: 08/03/1975  .  Smokeless tobacco: Never Used  . Alcohol Use: No    FAMILY HISTORY: Family History  Problem Relation Age of Onset  . Ovarian cancer Mother 32    secondary to ovarian cancer  . Leukemia Mother   . Stroke Father 70    brain stem infarction  . Breast cancer Paternal Aunt     EXAM: BP 121/85 mmHg  Pulse 132  Temp(Src) 97.9 F (36.6 C) (Oral)  Resp 18  Ht 5' 7.5" (1.715 m)  Wt 228 lb (103.42 kg)  BMI 35.16 kg/m2  SpO2 97% CONSTITUTIONAL: Alert and oriented and responds appropriately to questions. Well-appearing; well-nourished HEAD: Normocephalic EYES: Conjunctivae clear, PERRL ENT: normal nose; no rhinorrhea; moist mucous membranes NECK:  Supple, no meningismus, no LAD  CARD: Irregularly irregular and tachycardic; S1 and S2 appreciated; no murmurs, no clicks, no rubs, no gallops RESP: Normal chest excursion without splinting or tachypnea; breath sounds clear and equal bilaterally; no wheezes, no rhonchi, no rales, no hypoxia or respiratory distress, speaking full sentences ABD/GI: Normal bowel sounds; non-distended; soft, non-tender, no rebound, no guarding, no peritoneal signs BACK:  The back appears normal and is non-tender to palpation, there is no CVA tenderness EXT: Normal ROM in all joints; non-tender to palpation; no edema; normal capillary refill; no cyanosis, no calf tenderness or swelling    SKIN: Normal color for age and race; warm; no rash NEURO: Moves all extremities equally, sensation to light touch intact diffusely, cranial nerves II through XII intact PSYCH: The patient's mood and manner are appropriate. Grooming and personal hygiene are appropriate.  MEDICAL DECISION MAKING: Patient here several weeks of generalized weakness, lightheadedness. Complaining of palpitations and chest pressure that started last night at midnight. She is in atrial fibrillation with a rate that goes between the upper 100s to 140s. Otherwise hemodynamically stable. We'll give aspirin, diltiazem bolus and drip. She is not a candidate for cardioversion given she is not on anticoagulation and has had symptoms for weeks. Her chads score is 3.  We'll obtain cardiac labs, chest x-ray. Anticipate admission.  CHADS- Vasc = 3  ED PROGRESS: 6:35 AM  Pt's labs are unremarkable. Normal potassium and magnesium. Troponin is negative. She is still having chest pressure although her heart rate has improved to the 80s to 120s. She is on 10 mg of diltiazem. We'll give nitroglycerin. Because she is having persistent pain and has risk factors for ACS, will discuss with hospitalist for admission.   7:05 AM  Discussed patient's case with hospitalist, Dr.  Jerilee Hoh.  Recommend admission to inpatient, stepdown bed.  I will place holding orders per their request. Patient and family (if present) updated with plan. Care transferred to hospitalist service.  I reviewed all nursing notes, vitals, pertinent old records, EKGs, labs, imaging (as available).     EKG Interpretation  Date/Time:  Thursday January 12 2016 05:36:09 EDT Ventricular Rate:  118 PR Interval:    QRS Duration: 91 QT Interval:  293 QTC Calculation: 410 R Axis:   35 Text Interpretation:  Atrial fibrillation Posterior infarct, old Atrial fibrillation is new compared to prior EKG in 2013 but present in 2011 Confirmed by WARD,  DO, KRISTEN ([id number]) on 01/12/2016 5:39:00 AM        Delice Bison Ward, DO 01/12/16 0708   7:35 AM  Pt's Chest pressure improving with nitroglycerin. Heart rate in the 80s still in atrial fibrillation on diltiazem drip.  [place], DO 01/12/16 [phone]

## 2016-01-12 NOTE — ED Notes (Signed)
Report given to Janett Billow, RN for room ICU-01.

## 2016-01-13 ENCOUNTER — Encounter (HOSPITAL_COMMUNITY): Payer: Self-pay | Admitting: Adult Health

## 2016-01-13 DIAGNOSIS — I10 Essential (primary) hypertension: Secondary | ICD-10-CM

## 2016-01-13 DIAGNOSIS — I4891 Unspecified atrial fibrillation: Secondary | ICD-10-CM

## 2016-01-13 DIAGNOSIS — C9110 Chronic lymphocytic leukemia of B-cell type not having achieved remission: Secondary | ICD-10-CM

## 2016-01-13 LAB — CBC
HCT: 39.2 % (ref 36.0–46.0)
HEMOGLOBIN: 12.6 g/dL (ref 12.0–15.0)
MCH: 29.9 pg (ref 26.0–34.0)
MCHC: 32.1 g/dL (ref 30.0–36.0)
MCV: 93.1 fL (ref 78.0–100.0)
PLATELETS: 79 10*3/uL — AB (ref 150–400)
RBC: 4.21 MIL/uL (ref 3.87–5.11)
RDW: 16.1 % — ABNORMAL HIGH (ref 11.5–15.5)
WBC: 20 10*3/uL — ABNORMAL HIGH (ref 4.0–10.5)

## 2016-01-13 LAB — BASIC METABOLIC PANEL
ANION GAP: 7 (ref 5–15)
BUN: 12 mg/dL (ref 6–20)
CALCIUM: 9.9 mg/dL (ref 8.9–10.3)
CO2: 22 mmol/L (ref 22–32)
CREATININE: 0.55 mg/dL (ref 0.44–1.00)
Chloride: 113 mmol/L — ABNORMAL HIGH (ref 101–111)
GLUCOSE: 87 mg/dL (ref 65–99)
Potassium: 3.6 mmol/L (ref 3.5–5.1)
Sodium: 142 mmol/L (ref 135–145)

## 2016-01-13 LAB — TROPONIN I

## 2016-01-13 MED ORDER — POTASSIUM CHLORIDE CRYS ER 20 MEQ PO TBCR
40.0000 meq | EXTENDED_RELEASE_TABLET | Freq: Once | ORAL | Status: AC
  Administered 2016-01-13: 40 meq via ORAL
  Filled 2016-01-13: qty 2

## 2016-01-13 MED ORDER — DILTIAZEM HCL 30 MG PO TABS
30.0000 mg | ORAL_TABLET | Freq: Four times a day (QID) | ORAL | Status: DC
  Administered 2016-01-13 – 2016-01-16 (×14): 30 mg via ORAL
  Filled 2016-01-13 (×14): qty 1

## 2016-01-13 NOTE — Care Management Note (Signed)
Case Management Note  Patient Details  Name: Daisy Black MRN: [id number] Date of Birth: [birth year]  Subjective/Objective:                  Pt is from home, lives with her husband and is ind with ADL's. Family at bedside. Pt has had a-fib in the past and was on coumadin. Pt now being placed on Eliquis. Pt recently enrolled in medicare and does not have drug coverage at this time but can still enroll for free. Pt states she will call about doing this ASAP. Pt given 30-day voucher for eliquis. Discussed OOP cost of medications after 30-day voucher and pt's options if drug benefits are not active before 30-day supply is gone. Pt knows she can contact the drug company to assess for financial assistance if needed or discuss transition to coumadin if needed.    Action/Plan: Pt plans to return home over weekend with self care.   Expected Discharge Date:  01/14/16               Expected Discharge Plan:  Home/Self Care  In-House Referral:  NA  Discharge planning Services  CM Consult, Medication Assistance  Post Acute Care Choice:  NA Choice offered to:  NA  DME Arranged:    DME Agency:     HH Arranged:    HH Agency:     Status of Service:  Completed, signed off  Medicare Important Message Given:  Yes Date Medicare IM Given:    Medicare IM give by:    Date Additional Medicare IM Given:    Additional Medicare Important Message give by:     If discussed at [place] of Stay Meetings, dates discussed:    Additional Comments:  Sherald Barge, RN 01/13/2016, 12:09 PM

## 2016-01-13 NOTE — Progress Notes (Signed)
PROGRESS NOTE                                                                                                                                                                                                             Patient Demographics:    Daisy Black, is a 65 y.o. female, DOB - 01/17/1951, [id number]  Admit date - 01/12/2016   Admitting Physician Erline Hau, MD  Outpatient Primary MD for the patient is Pcp Not In System  LOS - 1  Chief Complaint  Patient presents with  . Tachycardia       Brief Narrative   65 y.o.female with  history of hypertension, hyperlipidemia, paroxysmal atrial fibrillation with DCCV in 2011, presents to the ED with feeling tired, lightheaded, was found to be in A. fib with RVR   Subjective:    Daisy Black today has, No headache, No chest pain, No abdominal pain - No Nausea,    Assessment  & Plan :    Principal Problem:   Atrial fibrillation with RVR ([place]) Active Problems:   Hyperlipidemia   Essential hypertension   Chest pain   Leukocytosis  Atrial fibrillation - Presents with A. fib with RVR,  on Cardizem drip, management per cardiology, transition to that he hasn't a milligram every 6 hours. - CHADS VASC Score of 3. Creatinine 0.55. Started on Eliquis 5 mg BID - Possible TEE with cardioversion as an outpatient.  Hypertension - Left pressure acceptable, continue with metoprolol and Cardizem  Hyperlipidemia - Continue with lovaza  Chronic Leukocytic Leukemia -  Followed by hem/onc  Hypothyroidism - Continue with Synthroid, TSH within normal limits  Code Status : Full Code  Family Communication  : Discussed with husband at bedside  Disposition Plan  : Home when stable  Consults  :  Cardiology  Procedures  : none  DVT Prophylaxis  :  On eliquis  Lab Results  Component Value Date   PLT 79* 01/13/2016    Antibiotics  :    Anti-infectives    None        Objective:   Filed Vitals:   01/13/16 1000 01/13/16 1100 01/13/16 1200 01/13/16 1300  BP: 92/67 101/61 113/89 108/79  Pulse: 99 96 123 100  Temp:      TempSrc:      Resp: 16 16 20  22  Height:      Weight:      SpO2: 96% 97% 97% 98%    Wt Readings from Last 3 Encounters:  01/12/16 105.6 kg (232 lb 12.9 oz)  12/28/15 106.4 kg (234 lb 9.1 oz)  08/03/15 106 kg (233 lb 11 oz)     Intake/Output Summary (Last 24 hours) at 01/13/16 1517 Last data filed at 01/13/16 1041  Gross per 24 hour  Intake 738.21 ml  Output      0 ml  Net 738.21 ml     Physical Exam  Awake Alert, Oriented X 3,  [place].AT,PERRAL Supple Neck,No JVD, No cervical lymphadenopathy appriciated.  Symmetrical Chest wall movement, Good air movement bilaterally, CTAB IRR IRR,No Gallops,Rubs or new Murmurs, No Parasternal Heave +ve B.Sounds, Abd Soft, No tenderness, No organomegaly appriciated, No rebound - guarding or rigidity. No Cyanosis, Clubbing or edema, No new Rash or bruise      Data Review:    CBC  Recent Labs Lab 01/12/16 0545 01/13/16 0425  WBC 21.2* 20.0*  HGB 13.5 12.6  HCT 41.6 39.2  PLT 83* 79*  MCV 92.2 93.1  MCH 29.9 29.9  MCHC 32.5 32.1  RDW 15.7* 16.1*    Chemistries   Recent Labs Lab 01/12/16 0545 01/12/16 1536 01/13/16 0425  NA 141  --  142  K 4.1  --  3.6  CL 113*  --  113*  CO2 25  --  22  GLUCOSE 112*  --  87  BUN 17  --  12  CREATININE 0.59  --  0.55  CALCIUM 10.0  --  9.9  MG 2.2 2.2  --    ------------------------------------------------------------------------------------------------------------------ No results for input(s): CHOL, HDL, LDLCALC, TRIG, CHOLHDL, LDLDIRECT in the last 72 hours.  No results found for: HGBA1C ------------------------------------------------------------------------------------------------------------------  Recent Labs  01/12/16 1536  TSH 1.986    ------------------------------------------------------------------------------------------------------------------ No results for input(s): VITAMINB12, FOLATE, FERRITIN, TIBC, IRON, RETICCTPCT in the last 72 hours.  Coagulation profile No results for input(s): INR, PROTIME in the last 168 hours.  No results for input(s): DDIMER in the last 72 hours.  Cardiac Enzymes  Recent Labs Lab 01/12/16 1536 01/12/16 2105 01/13/16 0425  TROPONINI <0.03 <0.03 <0.03   ------------------------------------------------------------------------------------------------------------------ No results found for: BNP  Inpatient Medications  Scheduled Meds: . apixaban  5 mg Oral BID  . diltiazem  30 mg Oral Q6H  . levothyroxine  125 mcg Oral QAC breakfast  . metoprolol tartrate  25 mg Oral BID  . omega-3 acid ethyl esters  1 g Oral BID  . pantoprazole  40 mg Oral Daily  . sodium chloride flush  3 mL Intravenous Q12H   Continuous Infusions:  PRN Meds:.sodium chloride, acetaminophen **OR** acetaminophen, ondansetron **OR** ondansetron (ZOFRAN) IV, senna-docusate, sodium chloride flush  Micro Results Recent Results (from the past 240 hour(s))  MRSA PCR Screening     Status: None   Collection Time: 01/12/16 10:00 AM  Result Value Ref Range Status   MRSA by PCR NEGATIVE NEGATIVE Final    Comment:        The GeneXpert MRSA Assay (FDA approved for NASAL specimens only), is one component of a comprehensive MRSA colonization surveillance program. It is not intended to diagnose MRSA infection nor to guide or monitor treatment for MRSA infections.     Radiology Reports Dg Chest 2 View  01/12/2016  CLINICAL DATA:  Chest pain with rapid heart rate for several hours this morning. EXAM: CHEST  2 VIEW COMPARISON:  08/03/2011 FINDINGS: Emphysematous changes and scattered fibrosis in the lungs. Normal heart size and pulmonary vascularity. No focal airspace disease or consolidation in the lungs. No  blunting of costophrenic angles. No pneumothorax. Mediastinal contours appear intact. Degenerative changes in the spine. IMPRESSION: No active cardiopulmonary disease. Electronically Signed   By: Lucienne Capers M.D.   On: 01/12/2016 06:29    Time Spent in minutes  25 minutes   Elbie Statzer M.D on 01/13/2016 at 3:17 PM  Between 7am to 7pm - Pager - [phone]  After 7pm go to [email or web address] - password [facility]  [facility] -  Office  [phone]

## 2016-01-13 NOTE — Care Management Important Message (Signed)
Important Message  Patient Details  Name: Daisy Black MRN: [id number] Date of Birth: 1951/07/16   Medicare Important Message Given:  Yes    Sherald Barge, RN 01/13/2016, 12:08 PM

## 2016-01-13 NOTE — Discharge Instructions (Addendum)

## 2016-01-13 NOTE — Consult Note (Signed)
CARDIOLOGY CONSULT NOTE   Patient ID: Daisy Black MRN: [id number] DOB/AGE: 65-Nov-1952 65 y.o.  Admit Date: 01/12/2016 Referring Physician: TRH-Elgergawy, MD Primary Physician: Pcp Not In System Consulting Cardiologist: Rozann Lesches MD Primary Cardiologist: Formerly Dr. Jacqulyn Ducking  Reason for Consultation: Atrial fib with RVR   Clinical Summary Daisy Black is a 65 y.o.female with known history of hypertension, hyperlipidemia, paroxysmal atrial fibrillation with DCCV in 2011(she is no longer on anticoagulation), who presented to the ER with complaints of feeling weak, tired and lightheaded. Just prior to coming to ER, she began to feel her heart racing. She has been medically compliant and doing well. She has not followed with cardiology since 2012. Other hx includes CLL and anemia. Followed by hem/onc.   She states she's been feeling her heart fluttering for over a month now, fast usually lasting seconds and going away on its own, but she did have associated weakness when it occurred. She said when she went to bed on Wednesday night she was doing well but awoke in the middle the night around 12:30 with racing heart rate and chest pressure and weighted to around 5 AM to come to the hospital when he did not stop on its own.  On arrival to ER, BP 121/85, HR 132 bpm, R-18,  She was afebrile. TSH normal, WBC elevated at 21.2, not anemic. Mg 2.2. CXR negative for cardiopulmonary disease. EKG revealed atrial fib with rate of 118 bpm. She was treated with diltiazem bolus, and started on a gtt. Moved to the unit. She was given NTG SL.  No Known Allergies  Medications Scheduled Medications: . apixaban  5 mg Oral BID  . diltiazem  30 mg Oral Q6H  . levothyroxine  125 mcg Oral QAC breakfast  . metoprolol tartrate  25 mg Oral BID  . omega-3 acid ethyl esters  1 g Oral BID  . pantoprazole  40 mg Oral Daily  . sodium chloride flush  3 mL Intravenous Q12H    PRN Medications: sodium  chloride, acetaminophen **OR** acetaminophen, ondansetron **OR** ondansetron (ZOFRAN) IV, senna-docusate, sodium chloride flush   Past Medical History  Diagnosis Date  . Essential hypertension   . Hyperlipidemia   . Obesity   . Atrial fibrillation ([place])     Atrial fibrillation - onset in 09/2009; DC cardioversion in 10/2009  . Lymphocytosis   . TSH elevation   . CLL (chronic lymphocytic leukemia) ([place])     Past Surgical History  Procedure Laterality Date  . Hernia repair  2011    Incisional and umbilical utilizing mesh  . Cholecystectomy    . Rotator cuff repair    . Ankle surgery Right   . Tonsillectomy      Family History  Problem Relation Age of Onset  . Ovarian cancer Mother 15    Secondary to ovarian cancer  . Leukemia Mother   . Stroke Father 50    Brain stem infarction  . Breast cancer Paternal Aunt     Social History Daisy Black reports that she quit smoking about 40 years ago. Her smoking use included Cigarettes. She has a .5 pack-year smoking history. She has never used smokeless tobacco. Daisy Black reports that she does not drink alcohol.  Review of Systems Complete review of systems are found to be negative unless outlined in H&P above.  Physical Examination Blood pressure 108/79, pulse 100, temperature 97.1 F (36.2 C), temperature source Oral, resp. rate 22, height 5' 7.5" (1.715 m), weight 232  lb 12.9 oz (105.6 kg), SpO2 98 %.  Intake/Output Summary (Last 24 hours) at 01/13/16 1440 Last data filed at 01/13/16 1041  Gross per 24 hour  Intake 738.21 ml  Output      0 ml  Net 738.21 ml    Telemetry:Atrial fibrillation heart rate in 90s to 115 bpm  GEN: No acute distress, sitting up in a chair HEENT: Conjunctiva and lids normal, oropharynx clear with moist mucosa. Neck: Supple, no elevated JVP or carotid bruits, no thyromegaly. Lungs: Clear to auscultation, nonlabored breathing at rest. Cardiac:Irregular rate and rhythm, no S3 or significant  systolic murmur, no pericardial rub. Abdomen: Soft, nontender, no hepatomegaly, bowel sounds present, no guarding or rebound. Obese Extremities: No pitting edema, distal pulses 2+. Skin: Warm and dry. Musculoskeletal: No kyphosis. Neuropsychiatric: Alert and oriented x3, affect grossly appropriate.  Prior Cardiac Testing/Procedures 1. Direct Current Cardioversion 12/08/2009     200 joules X 2.    Lab Results  Basic Metabolic Panel:  Recent Labs Lab 01/12/16 0545 01/12/16 1536 01/13/16 0425  NA 141  --  142  K 4.1  --  3.6  CL 113*  --  113*  CO2 25  --  22  GLUCOSE 112*  --  87  BUN 17  --  12  CREATININE 0.59  --  0.55  CALCIUM 10.0  --  9.9  MG 2.2 2.2  --     CBC:  Recent Labs Lab 01/12/16 0545 01/13/16 0425  WBC 21.2* 20.0*  HGB 13.5 12.6  HCT 41.6 39.2  MCV 92.2 93.1  PLT 83* 79*    Cardiac Enzymes:  Recent Labs Lab 01/12/16 0545 01/12/16 1536 01/12/16 2105 01/13/16 0425  TROPONINI <0.03 <0.03 <0.03 <0.03    Radiology: Dg Chest 2 View  01/12/2016  CLINICAL DATA:  Chest pain with rapid heart rate for several hours this morning. EXAM: CHEST  2 VIEW COMPARISON:  08/03/2011 FINDINGS: Emphysematous changes and scattered fibrosis in the lungs. Normal heart size and pulmonary vascularity. No focal airspace disease or consolidation in the lungs. No blunting of costophrenic angles. No pneumothorax. Mediastinal contours appear intact. Degenerative changes in the spine. IMPRESSION: No active cardiopulmonary disease. Electronically Signed   By: Lucienne Capers M.D.   On: 01/12/2016 06:29   Echocardiogram 01/12/2016 Left ventricle: The cavity size was normal. Wall thickness was  normal. Systolic function was normal. The estimated ejection  fraction was in the range of 55% to 60%. Wall motion was normal;  there were no regional wall motion abnormalities. The study is  not technically sufficient to allow evaluation of LV diastolic  function. - Left atrium:  The atrium was moderately dilated. - Atrial septum: A patent foramen ovale cannot be excluded.  ECG: Atrial fib with RVR rate of 118 bpm.  Impression and Recommendations  1. Persistent atrial fibrillation: Initially with RVR, but rate controlled on diltiazem gtt and po metoprolol 25 mg BID. She has been feeling her heart "fluttering" for a few months. Cannot tell that her heart rate is irregular now, but did experience chest pressure and rapid HR prompting admission. Review of prior records found that she was atenolol and diltiazem CD 360 along with coumadin when being followed by cardiology. She is only on atenolol now at home.   CHADS VASC Score of 3. Creatinine 0.55. Agree with placing her on Eliquis 5 mg BID. Will transition her to po diltiazem 30 mg Q 6 hours. Watch BP as this is soft. LA is moderately  dilated. Can consider TEE cardioversion (possibly as outpatient) if she does not convert with heart rate control.  2. Essential hypertension: Review of home medications has her on atenolol only for BP control. Consider evaluation for OSA in the setting of body habitus and recurrent atrial fibrillation.   3. Chronic Leukocytic Leukemia: Followed by hem/onc.  4. Hypothyroidism: TSH 1.986.   Signed: Phill Myron. Lawrence NP [place]  01/13/2016, 2:40 PM Co-Sign MD  Attending note:  Patient seen and examined. Reviewed records and updated the chart. Modified above note by Ms. Lawrence NP. Daisy Black is a former patient of Dr. Lattie Haw, not seen since 2012. She has a history of paroxysmal atrial fibrillation that was cardioverted back in 2011, previously on Coumadin, no major recurrences since that time that have required intervention. She has not had any regular cardiology follow-up. She presents to the hospital with a recurrent episode of persistent atrial fibrillation associated with RVR. She has been expansion palpitations over the last month. CHADSVASC score is 3. Additional history includes CLL  followed by Hematology.  On examination she appears comfortable, heart rate 9200 in atrial fibrillation. Lungs are clear and nonlabored, cardiac exam with irregularly irregular rhythm and no gallop. Echocardiogram done during this hospital stay shows LVEF 55-60% with moderately dilated left atrium. Lab work shows creatinine 0.55, negative troponin I levels, hemoglobin 12.6, platelets 79.  Persistent symptomatic atrial fibrillation presenting with RVR. Prior history of PAF as noted above requiring cardioversion in 2011. CHADSVASC score is 3. Discussed with patient and daughter at bedside. Agree with efforts at heart rate control. Would continue atenolol and convert to oral diltiazem. She recalls having some intolerances to high dose diltiazem in the past so this will need to be followed. Eliquis 5 mg twice daily would also be reasonable option for stroke prophylaxis and allow the possibility of a cardioversion if needed. She does have thrombocytopenia so will need to be followed closely for any potential bleeding. If heart rate comes under better control, even with ambulation, she can likely be discharged home for consideration of outpatient cardioversion. If on the other hand heart rate control is suboptimal and she stays in the hospital, a TEE cardioversion could be considered next week.  Satira Sark, M.D., F.A.C.C.

## 2016-01-14 LAB — BASIC METABOLIC PANEL
ANION GAP: 3 — AB (ref 5–15)
BUN: 13 mg/dL (ref 6–20)
CHLORIDE: 112 mmol/L — AB (ref 101–111)
CO2: 25 mmol/L (ref 22–32)
CREATININE: 0.68 mg/dL (ref 0.44–1.00)
Calcium: 9.7 mg/dL (ref 8.9–10.3)
GFR calc non Af Amer: >60 mL/min (ref >60)
Glucose, Bld: 97 mg/dL (ref 65–99)
POTASSIUM: 3.9 mmol/L (ref 3.5–5.1)
SODIUM: 140 mmol/L (ref 135–145)

## 2016-01-14 LAB — CBC
HEMATOCRIT: 41.5 % (ref 36.0–46.0)
HEMOGLOBIN: 12.9 g/dL (ref 12.0–15.0)
MCH: 29.1 pg (ref 26.0–34.0)
MCHC: 31.1 g/dL (ref 30.0–36.0)
MCV: 93.5 fL (ref 78.0–100.0)
Platelets: 84 10*3/uL — ABNORMAL LOW (ref 150–400)
RBC: 4.44 MIL/uL (ref 3.87–5.11)
RDW: 16 % — ABNORMAL HIGH (ref 11.5–15.5)
WBC: 21.8 10*3/uL — AB (ref 4.0–10.5)

## 2016-01-14 MED ORDER — DIGOXIN 0.25 MG/ML IJ SOLN
0.2500 mg | Freq: Once | INTRAMUSCULAR | Status: AC
  Administered 2016-01-14: 0.25 mg via INTRAVENOUS
  Filled 2016-01-14: qty 2

## 2016-01-14 MED ORDER — DIGOXIN 0.25 MG/ML IJ SOLN
0.1250 mg | Freq: Once | INTRAMUSCULAR | Status: AC
  Administered 2016-01-14: 0.125 mg via INTRAVENOUS
  Filled 2016-01-14: qty 2

## 2016-01-14 MED ORDER — DIGOXIN 125 MCG PO TABS
0.1250 mg | ORAL_TABLET | Freq: Every day | ORAL | Status: DC
  Administered 2016-01-15 – 2016-01-16 (×2): 0.125 mg via ORAL
  Filled 2016-01-14 (×2): qty 1

## 2016-01-14 NOTE — Progress Notes (Addendum)
PROGRESS NOTE                                                                                                                                                                                                             Patient Demographics:    Daisy Black, is a 65 y.o. female, DOB - [birth year], [id number]  Admit date - 01/12/2016   Admitting Physician Erline Hau, MD  Outpatient Primary MD for the patient is Pcp Not In System  LOS - 2  Chief Complaint  Patient presents with  . Tachycardia       Brief Narrative   65 y.o.female with  history of hypertension, hyperlipidemia, paroxysmal atrial fibrillation with DCCV in 2011, presents to the ED with feeling tired, lightheaded, was found to be in A. fib with RVR.   Subjective:    Daisy Black today has, No headache, No chest pain, No abdominal pain - No Nausea,    Assessment  & Plan :    Principal Problem:   Atrial fibrillation with RVR ([facility]) Active Problems:   Hyperlipidemia   Essential hypertension   Chest pain   Leukocytosis  Atrial fibrillation - Presents with A. fib with RVR,  Initially on Cardizem drip,  transitioned to by mouth Cardizem 80 mg every 6 hours, metoprolol 25 mg by mouth twice a day, remains in A. fib with RVR this a.m. with heart rates in the 140s to 150s, soft blood pressure, will load her with digoxin today, and start at low dose in a.m.Would consider amiodarone drip of normal improvement with heartrate after digoxin load. - CHADS VASC Score of 3. Creatinine 0.55. Started on Eliquis 5 mg BID, monitor closely giving her thrombocytopenia - Possible TEE with cardioversion as an outpatient.  Hypertension - Blood pressure on the lower side, continue with metoprolol and Cardizem for heart rate control   Hyperlipidemia - Continue with lovaza  Chronic Leukocytic Leukemia -  Followed by hem/onc - Thrombocytopenia most likely in the setting of CLL,  continue to monitor closely.  Hypothyroidism - Continue with Synthroid, TSH within normal limits  Code Status : Full Code  Family Communication  : none at bedside  Disposition Plan  : Home when stable  Consults  :  Cardiology  Procedures  : none  DVT Prophylaxis  :  On eliquis  Lab  Results  Component Value Date   PLT 84* 01/14/2016    Antibiotics  :    Anti-infectives    None        Objective:   Filed Vitals:   01/14/16 0832 01/14/16 0900 01/14/16 1000 01/14/16 1100  BP:  108/83 92/76 102/63  Pulse:  96 115 107  Temp: 97.1 F (36.2 C)     TempSrc: Oral     Resp:  17 16 16   Height:      Weight:      SpO2:  97% 97% 98%    Wt Readings from Last 3 Encounters:  01/14/16 104.4 kg (230 lb 2.6 oz)  12/28/15 106.4 kg (234 lb 9.1 oz)  08/03/15 106 kg (233 lb 11 oz)     Intake/Output Summary (Last 24 hours) at 01/14/16 1312 Last data filed at 01/13/16 1716  Gross per 24 hour  Intake    360 ml  Output      0 ml  Net    360 ml     Physical Exam  Awake Alert, Oriented X 3,  [place].AT,PERRAL Supple Neck,No JVD, No cervical lymphadenopathy appriciated.  Symmetrical Chest wall movement, Good air movement bilaterally, CTAB IRR IRR,No Gallops,Rubs or new Murmurs, No Parasternal Heave +ve B.Sounds, Abd Soft, No tenderness, No organomegaly appriciated, No rebound - guarding or rigidity. No Cyanosis, Clubbing or edema, No new Rash or bruise      Data Review:    CBC  Recent Labs Lab 01/12/16 0545 01/13/16 0425 01/14/16 0523  WBC 21.2* 20.0* 21.8*  HGB 13.5 12.6 12.9  HCT 41.6 39.2 41.5  PLT 83* 79* 84*  MCV 92.2 93.1 93.5  MCH 29.9 29.9 29.1  MCHC 32.5 32.1 31.1  RDW 15.7* 16.1* 16.0*    Chemistries   Recent Labs Lab 01/12/16 0545 01/12/16 1536 01/13/16 0425 01/14/16 0523  NA 141  --  142 140  K 4.1  --  3.6 3.9  CL 113*  --  113* 112*  CO2 25  --  22 25  GLUCOSE 112*  --  87 97  BUN 17  --  12 13  CREATININE 0.59  --  0.55 0.68  CALCIUM  10.0  --  9.9 9.7  MG 2.2 2.2  --   --    ------------------------------------------------------------------------------------------------------------------ No results for input(s): CHOL, HDL, LDLCALC, TRIG, CHOLHDL, LDLDIRECT in the last 72 hours.  No results found for: HGBA1C ------------------------------------------------------------------------------------------------------------------  Recent Labs  01/12/16 1536  TSH 1.986   ------------------------------------------------------------------------------------------------------------------ No results for input(s): VITAMINB12, FOLATE, FERRITIN, TIBC, IRON, RETICCTPCT in the last 72 hours.  Coagulation profile No results for input(s): INR, PROTIME in the last 168 hours.  No results for input(s): DDIMER in the last 72 hours.  Cardiac Enzymes  Recent Labs Lab 01/12/16 1536 01/12/16 2105 01/13/16 0425  TROPONINI <0.03 <0.03 <0.03   ------------------------------------------------------------------------------------------------------------------ No results found for: BNP  Inpatient Medications  Scheduled Meds: . apixaban  5 mg Oral BID  . digoxin  0.125 mg Intravenous Once  . digoxin  0.125 mg Intravenous Once  . [START ON 01/15/2016] digoxin  0.125 mg Oral Daily  . diltiazem  30 mg Oral Q6H  . levothyroxine  125 mcg Oral QAC breakfast  . metoprolol tartrate  25 mg Oral BID  . omega-3 acid ethyl esters  1 g Oral BID  . pantoprazole  40 mg Oral Daily  . sodium chloride flush  3 mL Intravenous Q12H   Continuous Infusions:  PRN Meds:.sodium chloride,  acetaminophen **OR** acetaminophen, ondansetron **OR** ondansetron (ZOFRAN) IV, senna-docusate, sodium chloride flush  Micro Results Recent Results (from the past 240 hour(s))  MRSA PCR Screening     Status: None   Collection Time: 01/12/16 10:00 AM  Result Value Ref Range Status   MRSA by PCR NEGATIVE NEGATIVE Final    Comment:        The GeneXpert MRSA Assay  (FDA approved for NASAL specimens only), is one component of a comprehensive MRSA colonization surveillance program. It is not intended to diagnose MRSA infection nor to guide or monitor treatment for MRSA infections.     Radiology Reports Dg Chest 2 View  01/12/2016  CLINICAL DATA:  Chest pain with rapid heart rate for several hours this morning. EXAM: CHEST  2 VIEW COMPARISON:  08/03/2011 FINDINGS: Emphysematous changes and scattered fibrosis in the lungs. Normal heart size and pulmonary vascularity. No focal airspace disease or consolidation in the lungs. No blunting of costophrenic angles. No pneumothorax. Mediastinal contours appear intact. Degenerative changes in the spine. IMPRESSION: No active cardiopulmonary disease. Electronically Signed   By: Lucienne Capers M.D.   On: 01/12/2016 06:29    Time Spent in minutes  25 minutes   Melodie Ashworth M.D on 01/14/2016 at 1:12 PM  Between 7am to 7pm - Pager - [phone]  After 7pm go to [email or web address] - password [facility]  [facility] -  Office  [phone]

## 2016-01-15 NOTE — Progress Notes (Signed)
PROGRESS NOTE    Daisy Black  [id number] DOB: [birth year] DOA: 01/12/2016 PCP: Pcp Not In System     Brief Narrative:  65 y/o woman admitted on 6/15 for a fib with RVR. Status post cardioversion in 2011. Has not had regular cardiological follow-up since.   Assessment & Plan:   Principal Problem:   Atrial fibrillation with RVR (HCC) Active Problems:   Chest pain   Hyperlipidemia   Essential hypertension   Leukocytosis   Atrial fibrillation with rapid ventricular response -Rates improved in the upper 80s to 90s, remains in A. fib. Cardizem drip has been discontinued. -Continue Cardizem, metoprolol, digoxin. -Rate still increased to the 130s with movement, cardiology to see in a.m. and determine if inpatient versus outpatient cardioversion is still on the table. -Continue eliquis for anticoagulation.  Hypertension -blood pressure low normal, continue current regimen.  Hyperlipidemia -Continue Lovaza.  Chronic lymphocytic leukemia -Continue outpatient follow-up with oncology as scheduled, white blood cell count has remained stable around 20,000.  Hypothyroidism -Continue Synthroid, TSH within normal limits  DVT prophylaxis: On Eliquis Code Status: Full code Family Communication: Daughter at bedside Disposition Plan: Transfer to telemetry  Consultants:   Cardiology  Procedures:   None  Antimicrobials:   None    Subjective: Feels much improved, less palpitations, no current chest pain  Objective: Filed Vitals:   01/15/16 0613 01/15/16 0700 01/15/16 0742 01/15/16 0800  BP: 98/63 98/56  104/67  Pulse:  81  96  Temp:   97.5 F (36.4 C)   TempSrc:   Oral   Resp:  14  22  Height:      Weight:      SpO2:  97%  97%    Intake/Output Summary (Last 24 hours) at 01/15/16 1049 Last data filed at 01/15/16 1023  Gross per 24 hour  Intake    363 ml  Output      0 ml  Net    363 ml   Filed Weights   01/12/16 1011 01/14/16 0347 01/15/16 0500    Weight: 105.6 kg (232 lb 12.9 oz) 104.4 kg (230 lb 2.6 oz) 105.1 kg (231 lb 11.3 oz)    Examination:  General exam: Alert, awake, oriented x 3 Respiratory system: Clear to auscultation. Respiratory effort normal. Cardiovascular system:Irregular rate and rhythm, no murmurs, rubs or gallops Gastrointestinal system: Abdomen is nondistended, soft and nontender. No organomegaly or masses felt. Normal bowel sounds heard. Central nervous system: Alert and oriented. No focal neurological deficits. Extremities: No C/C/E, +pedal pulses Skin: No rashes, lesions or ulcers Psychiatry: Judgement and insight appear normal. Mood & affect appropriate.     Data Reviewed: I have personally reviewed following labs and imaging studies  CBC:  Recent Labs Lab 01/12/16 0545 01/13/16 0425 01/14/16 0523  WBC 21.2* 20.0* 21.8*  HGB 13.5 12.6 12.9  HCT 41.6 39.2 41.5  MCV 92.2 93.1 93.5  PLT 83* 79* 84*   Basic Metabolic Panel:  Recent Labs Lab 01/12/16 0545 01/12/16 1536 01/13/16 0425 01/14/16 0523  NA 141  --  142 140  K 4.1  --  3.6 3.9  CL 113*  --  113* 112*  CO2 25  --  22 25  GLUCOSE 112*  --  87 97  BUN 17  --  12 13  CREATININE 0.59  --  0.55 0.68  CALCIUM 10.0  --  9.9 9.7  MG 2.2 2.2  --   --    GFR: Estimated Creatinine Clearance: 88.2 mL/min (  by C-G formula based on Cr of 0.68). Liver Function Tests: No results for input(s): AST, ALT, ALKPHOS, BILITOT, PROT, ALBUMIN in the last 168 hours. No results for input(s): LIPASE, AMYLASE in the last 168 hours. No results for input(s): AMMONIA in the last 168 hours. Coagulation Profile: No results for input(s): INR, PROTIME in the last 168 hours. Cardiac Enzymes:  Recent Labs Lab 01/12/16 0545 01/12/16 1536 01/12/16 2105 01/13/16 0425  TROPONINI <0.03 <0.03 <0.03 <0.03   BNP (last 3 results) No results for input(s): PROBNP in the last 8760 hours. HbA1C: No results for input(s): HGBA1C in the last 72 hours. CBG: No  results for input(s): GLUCAP in the last 168 hours. Lipid Profile: No results for input(s): CHOL, HDL, LDLCALC, TRIG, CHOLHDL, LDLDIRECT in the last 72 hours. Thyroid Function Tests:  Recent Labs  01/12/16 1536  TSH 1.986   Anemia Panel: No results for input(s): VITAMINB12, FOLATE, FERRITIN, TIBC, IRON, RETICCTPCT in the last 72 hours. Urine analysis:    Component Value Date/Time   COLORURINE YELLOW 01/12/2016 0636   APPEARANCEUR CLEAR 01/12/2016 0636   LABSPEC <1.005* 01/12/2016 0636   PHURINE 6.5 01/12/2016 0636   GLUCOSEU NEGATIVE 01/12/2016 0636   HGBUR NEGATIVE 01/12/2016 0636   BILIRUBINUR NEGATIVE 01/12/2016 0636   KETONESUR NEGATIVE 01/12/2016 0636   PROTEINUR NEGATIVE 01/12/2016 0636   NITRITE NEGATIVE 01/12/2016 0636   LEUKOCYTESUR TRACE* 01/12/2016 0636   Sepsis Labs: @LABRCNTIP (procalcitonin:4,lacticidven:4)  ) Recent Results (from the past 240 hour(s))  MRSA PCR Screening     Status: None   Collection Time: 01/12/16 10:00 AM  Result Value Ref Range Status   MRSA by PCR NEGATIVE NEGATIVE Final    Comment:        The GeneXpert MRSA Assay (FDA approved for NASAL specimens only), is one component of a comprehensive MRSA colonization surveillance program. It is not intended to diagnose MRSA infection nor to guide or monitor treatment for MRSA infections.          Radiology Studies: No results found.      Scheduled Meds: . apixaban  5 mg Oral BID  . digoxin  0.125 mg Oral Daily  . diltiazem  30 mg Oral Q6H  . levothyroxine  125 mcg Oral QAC breakfast  . metoprolol tartrate  25 mg Oral BID  . omega-3 acid ethyl esters  1 g Oral BID  . pantoprazole  40 mg Oral Daily  . sodium chloride flush  3 mL Intravenous Q12H   Continuous Infusions:    LOS: 3 days    Time spent: 25 minutes. Greater than 50% of this time was spent in direct contact with the patient coordinating care.     Lelon Frohlich, MD [facility] Pager  [phone]  If 7PM-7AM, please contact night-coverage [email or web address] Password [facility] 01/15/2016, 10:49 AM

## 2016-01-16 ENCOUNTER — Other Ambulatory Visit: Payer: Self-pay | Admitting: Cardiovascular Disease

## 2016-01-16 DIAGNOSIS — D72829 Elevated white blood cell count, unspecified: Secondary | ICD-10-CM

## 2016-01-16 DIAGNOSIS — D696 Thrombocytopenia, unspecified: Secondary | ICD-10-CM

## 2016-01-16 DIAGNOSIS — I4891 Unspecified atrial fibrillation: Secondary | ICD-10-CM

## 2016-01-16 DIAGNOSIS — E785 Hyperlipidemia, unspecified: Secondary | ICD-10-CM

## 2016-01-16 LAB — CBC
HCT: 41.2 % (ref 36.0–46.0)
HEMATOCRIT: 41.8 % (ref 36.0–46.0)
Hemoglobin: 13.3 g/dL (ref 12.0–15.0)
Hemoglobin: 13.4 g/dL (ref 12.0–15.0)
MCH: 30 pg (ref 26.0–34.0)
MCH: 30 pg (ref 26.0–34.0)
MCHC: 32.3 g/dL (ref 30.0–36.0)
MCHC: 32.1 g/dL (ref 30.0–36.0)
MCV: 92.8 fL (ref 78.0–100.0)
MCV: 93.7 fL (ref 78.0–100.0)
PLATELETS: 98 10*3/uL — AB (ref 150–400)
PLATELETS: 82 10*3/uL — AB (ref 150–400)
RBC: 4.44 MIL/uL (ref 3.87–5.11)
RBC: 4.46 MIL/uL (ref 3.87–5.11)
RDW: 15.8 % — AB (ref 11.5–15.5)
RDW: 15.9 % — AB (ref 11.5–15.5)
WBC: 22.2 10*3/uL — ABNORMAL HIGH (ref 4.0–10.5)
WBC: 19.5 10*3/uL — ABNORMAL HIGH (ref 4.0–10.5)

## 2016-01-16 LAB — BASIC METABOLIC PANEL
Anion gap: 3 — ABNORMAL LOW (ref 5–15)
BUN: 17 mg/dL (ref 6–20)
CALCIUM: 9.8 mg/dL (ref 8.9–10.3)
CO2: 27 mmol/L (ref 22–32)
CREATININE: 0.73 mg/dL (ref 0.44–1.00)
Chloride: 111 mmol/L (ref 101–111)
GFR calc Af Amer: >60 mL/min (ref >60)
GLUCOSE: 102 mg/dL — AB (ref 65–99)
Potassium: 3.8 mmol/L (ref 3.5–5.1)
Sodium: 141 mmol/L (ref 135–145)

## 2016-01-16 MED ORDER — DILTIAZEM HCL 30 MG PO TABS
30.0000 mg | ORAL_TABLET | Freq: Once | ORAL | Status: AC
  Administered 2016-01-17: 30 mg via ORAL
  Filled 2016-01-16: qty 1

## 2016-01-16 MED ORDER — SODIUM CHLORIDE 0.9 % IV SOLN: 250.0000 mL | INTRAVENOUS | Status: DC

## 2016-01-16 MED ORDER — SODIUM CHLORIDE 0.9% FLUSH: 3.0000 mL | INTRAVENOUS | Status: DC | PRN

## 2016-01-16 MED ORDER — SODIUM CHLORIDE 0.9% FLUSH
3.0000 mL | Freq: Two times a day (BID) | INTRAVENOUS | Status: DC
  Administered 2016-01-16: 3 mL via INTRAVENOUS

## 2016-01-16 MED ORDER — SODIUM CHLORIDE 0.9% FLUSH: 3.0000 mL | Freq: Two times a day (BID) | INTRAVENOUS | Status: DC

## 2016-01-16 NOTE — Care Management Important Message (Signed)
Important Message  Patient Details  Name: CHERRILL ENGELHARDT MRN: [id number] Date of Birth: [birth year]   Medicare Important Message Given:  Yes    Sherald Barge, RN 01/16/2016, 2:08 PM

## 2016-01-16 NOTE — Progress Notes (Signed)
Pt a/o.vss. Saline lock patent. No complaints of any pain. Report given to L.Chyrl Civatte, Therapist, sports. Pt to be transferred to room 310 via wheelchair with nursing staff.

## 2016-01-16 NOTE — Progress Notes (Addendum)
Primary Cardiologist: Gwen Her  Cardiology Specific Problem List: 1. Persistent Atrial fibrillation-CHADS VASC Score of 3.  2. Hypertension  Subjective:    Heart rate continues labile. She is symptomatic with this, feeling the fluttering and fatigue. HR goes up with minimal activity.   Objective:   Temp:  [97.1 F (36.2 C)] 97.1 F (36.2 C) (06/18 1700) Pulse Rate:  [71-119] 99 (06/19 0700) Resp:  [11-24] 14 (06/19 0700) BP: (82-109)/(49-77) 97/64 mmHg (06/19 0700) SpO2:  [95 %-100 %] 96 % (06/19 0700) Last BM Date: 01/11/16  Filed Weights   01/12/16 1011 01/14/16 0347 01/15/16 0500  Weight: 232 lb 12.9 oz (105.6 kg) 230 lb 2.6 oz (104.4 kg) 231 lb 11.3 oz (105.1 kg)    Intake/Output Summary (Last 24 hours) at 01/16/16 0806 Last data filed at 01/15/16 1704  Gross per 24 hour  Intake    603 ml  Output      0 ml  Net    603 ml    Telemetry: Atrial fib with rates in the 80's-120's   Exam:  General: No acute distress.  HEENT: Conjunctiva and lids normal, oropharynx clear.  Lungs: Clear to auscultation, nonlabored.  Cardiac: No elevated JVP or bruits. IRRR, no gallop or rub.   Abdomen: Normoactive bowel sounds, nontender, nondistended.  Extremities: No pitting edema, distal pulses full.  Neuropsychiatric: Alert and oriented x3, affect appropriate.  Echocardiogram 01/12/2016 Left ventricle: The cavity size was normal. Wall thickness was  normal. Systolic function was normal. The estimated ejection  fraction was in the range of 55% to 60%. Wall motion was normal;  there were no regional wall motion abnormalities. The study is  not technically sufficient to allow evaluation of LV diastolic  function. - Left atrium: The atrium was moderately dilated. - Atrial septum: A patent foramen ovale cannot be excluded.  Lab Results:  Basic Metabolic Panel:  Recent Labs Lab 01/12/16 0545 01/12/16 1536 01/13/16 0425 01/14/16 0523 01/16/16 0505  NA 141   --  142 140 141  K 4.1  --  3.6 3.9 3.8  CL 113*  --  113* 112* 111  CO2 25  --  22 25 27   GLUCOSE 112*  --  87 97 102*  BUN 17  --  12 13 17   CREATININE 0.59  --  0.55 0.68 0.73  CALCIUM 10.0  --  9.9 9.7 9.8  MG 2.2 2.2  --   --   --      CBC:  Recent Labs Lab 01/14/16 0523 01/16/16 0147 01/16/16 0505  WBC 21.8* 22.2* 19.5*  HGB 12.9 13.3 13.4  HCT 41.5 41.2 41.8  MCV 93.5 92.8 93.7  PLT 84* 98* 82*    Cardiac Enzymes:  Recent Labs Lab 01/12/16 1536 01/12/16 2105 01/13/16 0425  TROPONINI <0.03 <0.03 <0.03     Medications:   Scheduled Medications: . apixaban  5 mg Oral BID  . digoxin  0.125 mg Oral Daily  . diltiazem  30 mg Oral Q6H  . levothyroxine  125 mcg Oral QAC breakfast  . metoprolol tartrate  25 mg Oral BID  . omega-3 acid ethyl esters  1 g Oral BID  . pantoprazole  40 mg Oral Daily  . sodium chloride flush  3 mL Intravenous Q12H      PRN Medications: sodium chloride, acetaminophen **OR** acetaminophen, ondansetron **OR** ondansetron (ZOFRAN) IV, senna-docusate, sodium chloride flush   Assessment and Plan:   1.Atrial fib: She is symptomatic with this, "  I can't stand it." With associated fatigue and chest pressure. She continues on dilitazem 30 mg Q 6 hours and digoxin 0.125 mg daily.  She is anticoagulated on Eliquis 5mg  BID for last  5 days. Discussion with patient by Dr. Domenic Polite for possible TEE/DCCV was had last week. She would like to proceed as she is uncomfortable with irregular HR.  She is now NPO. Will discuss further with Dr. Bronson Ing concerning scheduling should he decide to proceed or chose pharmacologic alternative for conversion. Amiodarone is possibility.   2. Hypertension: BP is soft. Will not be able to increase diltiazem for better HR control. She is on no other antihypertensive mediations.   Phill Myron. Lawrence NP [place]  01/16/2016, 8:06 AM   The patient was seen and examined, and I agree with the history, physical exam,  assessment and plan as documented above which has been discussed with Arnold Long NP with modifications as noted below. Patient remains very symptomatic with minimal activity such as brushing her teeth. Comfortable only at rest. SBP remains low normal. Anticoagulated with Eliquis as noted above. Remains thrombocytopenic. Not much room for increasing dose of AV nodal blocking agents. Will plan for TEE/cardioversion tomorrow morning due to PACU scheduling.  Kate Sable, MD, [facility]  01/16/2016 9:17 AM

## 2016-01-16 NOTE — Progress Notes (Signed)
PROGRESS NOTE    Daisy Black  [id number] DOB: 1950/09/02 DOA: 01/12/2016 PCP: Pcp Not In System     Brief Narrative:  65 y/o woman admitted on 6/15 for a fib with RVR. Status post cardioversion in 2011. Has not had regular cardiological follow-up since.   Assessment & Plan:   Principal Problem:   Atrial fibrillation with RVR (HCC) Active Problems:   Chest pain   Hyperlipidemia   Essential hypertension   Leukocytosis   Atrial fibrillation with rapid ventricular response -Rates improved in the upper 80s to 90s, remains in A. fib. Cardizem drip has been discontinued. -Continue Cardizem, metoprolol, digoxin. -Rate still increased to the 130s with movement, still very symptomatic with minimal activity. #-Seen by cardiology with plans for cardioversion in a.m. -Continue eliquis for anticoagulation.  Hypertension -blood pressure low normal, continue current regimen.  Hyperlipidemia -Continue Lovaza.  Chronic lymphocytic leukemia -Continue outpatient follow-up with oncology as scheduled, white blood cell count has remained stable around 20,000.  Hypothyroidism -Continue Synthroid, TSH within normal limits  DVT prophylaxis: On Eliquis Code Status: Full code Family Communication: Daughters at bedside Disposition Plan: Transfer to telemetry  Consultants:   Cardiology  Procedures:   None  Antimicrobials:   None    Subjective: Feels much improved, less palpitations, no current chest pain  Objective: Filed Vitals:   01/16/16 1300 01/16/16 1330 01/16/16 1400 01/16/16 1430  BP: 90/57 94/61 94/64  95/50  Pulse:      Temp:      TempSrc:      Resp: 15 16 12 17   Height:      Weight:      SpO2:        Intake/Output Summary (Last 24 hours) at 01/16/16 1621 Last data filed at 01/16/16 1230  Gross per 24 hour  Intake    920 ml  Output      0 ml  Net    920 ml   Filed Weights   01/12/16 1011 01/14/16 0347 01/15/16 0500  Weight: 105.6 kg (232 lb 12.9  oz) 104.4 kg (230 lb 2.6 oz) 105.1 kg (231 lb 11.3 oz)    Examination:  General exam: Alert, awake, oriented x 3 Respiratory system: Clear to auscultation. Respiratory effort normal. Cardiovascular system:Irregular rate and rhythm, no murmurs, rubs or gallops Gastrointestinal system: Abdomen is nondistended, soft and nontender. No organomegaly or masses felt. Normal bowel sounds heard. Central nervous system: Alert and oriented. No focal neurological deficits. Extremities: No C/C/E, +pedal pulses Skin: No rashes, lesions or ulcers Psychiatry: Judgement and insight appear normal. Mood & affect appropriate.     Data Reviewed: I have personally reviewed following labs and imaging studies  CBC:  Recent Labs Lab 01/12/16 0545 01/13/16 0425 01/14/16 0523 01/16/16 0147 01/16/16 0505  WBC 21.2* 20.0* 21.8* 22.2* 19.5*  HGB 13.5 12.6 12.9 13.3 13.4  HCT 41.6 39.2 41.5 41.2 41.8  MCV 92.2 93.1 93.5 92.8 93.7  PLT 83* 79* 84* 98* 82*   Basic Metabolic Panel:  Recent Labs Lab 01/12/16 0545 01/12/16 1536 01/13/16 0425 01/14/16 0523 01/16/16 0505  NA 141  --  142 140 141  K 4.1  --  3.6 3.9 3.8  CL 113*  --  113* 112* 111  CO2 25  --  22 25 27   GLUCOSE 112*  --  87 97 102*  BUN 17  --  12 13 17   CREATININE 0.59  --  0.55 0.68 0.73  CALCIUM 10.0  --  9.9 9.7 9.8  MG 2.2 2.2  --   --   --    GFR: Estimated Creatinine Clearance: 88.2 mL/min (by C-G formula based on Cr of 0.73). Liver Function Tests: No results for input(s): AST, ALT, ALKPHOS, BILITOT, PROT, ALBUMIN in the last 168 hours. No results for input(s): LIPASE, AMYLASE in the last 168 hours. No results for input(s): AMMONIA in the last 168 hours. Coagulation Profile: No results for input(s): INR, PROTIME in the last 168 hours. Cardiac Enzymes:  Recent Labs Lab 01/12/16 0545 01/12/16 1536 01/12/16 2105 01/13/16 0425  TROPONINI <0.03 <0.03 <0.03 <0.03   BNP (last 3 results) No results for input(s): PROBNP  in the last 8760 hours. HbA1C: No results for input(s): HGBA1C in the last 72 hours. CBG: No results for input(s): GLUCAP in the last 168 hours. Lipid Profile: No results for input(s): CHOL, HDL, LDLCALC, TRIG, CHOLHDL, LDLDIRECT in the last 72 hours. Thyroid Function Tests: No results for input(s): TSH, T4TOTAL, FREET4, T3FREE, THYROIDAB in the last 72 hours. Anemia Panel: No results for input(s): VITAMINB12, FOLATE, FERRITIN, TIBC, IRON, RETICCTPCT in the last 72 hours. Urine analysis:    Component Value Date/Time   COLORURINE YELLOW 01/12/2016 0636   APPEARANCEUR CLEAR 01/12/2016 0636   LABSPEC <1.005* 01/12/2016 0636   PHURINE 6.5 01/12/2016 0636   GLUCOSEU NEGATIVE 01/12/2016 0636   HGBUR NEGATIVE 01/12/2016 0636   BILIRUBINUR NEGATIVE 01/12/2016 0636   KETONESUR NEGATIVE 01/12/2016 0636   PROTEINUR NEGATIVE 01/12/2016 0636   NITRITE NEGATIVE 01/12/2016 0636   LEUKOCYTESUR TRACE* 01/12/2016 0636   Sepsis Labs: @LABRCNTIP (procalcitonin:4,lacticidven:4)  ) Recent Results (from the past 240 hour(s))  MRSA PCR Screening     Status: None   Collection Time: 01/12/16 10:00 AM  Result Value Ref Range Status   MRSA by PCR NEGATIVE NEGATIVE Final    Comment:        The GeneXpert MRSA Assay (FDA approved for NASAL specimens only), is one component of a comprehensive MRSA colonization surveillance program. It is not intended to diagnose MRSA infection nor to guide or monitor treatment for MRSA infections.          Radiology Studies: No results found.      Scheduled Meds: . apixaban  5 mg Oral BID  . digoxin  0.125 mg Oral Daily  . diltiazem  30 mg Oral Q6H  . levothyroxine  125 mcg Oral QAC breakfast  . metoprolol tartrate  25 mg Oral BID  . omega-3 acid ethyl esters  1 g Oral BID  . pantoprazole  40 mg Oral Daily  . sodium chloride flush  3 mL Intravenous Q12H  . sodium chloride flush  3 mL Intravenous Q12H  . sodium chloride flush  3 mL Intravenous Q12H    Continuous Infusions: . sodium chloride    . sodium chloride       LOS: 4 days    Time spent: 25 minutes. Greater than 50% of this time was spent in direct contact with the patient coordinating care.     Lelon Frohlich, MD [facility] Pager [phone]  If 7PM-7AM, please contact night-coverage [email or web address] Password [email or web address] 01/16/2016, 4:21 PM

## 2016-01-17 ENCOUNTER — Encounter (HOSPITAL_COMMUNITY)
Admission: EM | Disposition: A | Discharge status: Home / Self Care | Payer: Self-pay | Source: Home / Self Care | Attending: Internal Medicine

## 2016-01-17 ENCOUNTER — Inpatient Hospital Stay (HOSPITAL_COMMUNITY): Payer: Medicare Other | Admitting: Anesthesiology

## 2016-01-17 ENCOUNTER — Inpatient Hospital Stay (HOSPITAL_COMMUNITY): Payer: Medicare Other

## 2016-01-17 ENCOUNTER — Encounter (HOSPITAL_COMMUNITY): Payer: Self-pay | Admitting: *Deleted

## 2016-01-17 ENCOUNTER — Other Ambulatory Visit: Payer: Self-pay

## 2016-01-17 DIAGNOSIS — I4891 Unspecified atrial fibrillation: Secondary | ICD-10-CM

## 2016-01-17 DIAGNOSIS — I5189 Other ill-defined heart diseases: Secondary | ICD-10-CM

## 2016-01-17 HISTORY — PX: TEE WITHOUT CARDIOVERSION: SHX5443

## 2016-01-17 SURGERY — CARDIOVERSION
Anesthesia: Monitor Anesthesia Care

## 2016-01-17 SURGERY — ECHOCARDIOGRAM, TRANSESOPHAGEAL
Anesthesia: Monitor Anesthesia Care

## 2016-01-17 MED ORDER — METOPROLOL TARTRATE 25 MG PO TABS
25.0000 mg | ORAL_TABLET | Freq: Two times a day (BID) | ORAL | Status: DC
  Administered 2016-01-17: 25 mg via ORAL
  Filled 2016-01-17: qty 1

## 2016-01-17 MED ORDER — PROPOFOL 500 MG/50ML IV EMUL
INTRAVENOUS | Status: DC | PRN
  Administered 2016-01-17: 50 ug/kg/min via INTRAVENOUS

## 2016-01-17 MED ORDER — DIGOXIN 250 MCG PO TABS: 0.2500 mg | ORAL_TABLET | Freq: Every day | ORAL | Status: DC

## 2016-01-17 MED ORDER — SODIUM CHLORIDE 0.9 % IV SOLN: 250.0000 mL | INTRAVENOUS | Status: DC

## 2016-01-17 MED ORDER — BUTAMBEN-TETRACAINE-BENZOCAINE 2-2-14 % EX AERO
INHALATION_SPRAY | CUTANEOUS | Status: DC | PRN
  Administered 2016-01-17: 2 via TOPICAL

## 2016-01-17 MED ORDER — LACTATED RINGERS IV SOLN
INTRAVENOUS | Status: DC
  Administered 2016-01-17: 09:00:00 via INTRAVENOUS

## 2016-01-17 MED ORDER — SODIUM CHLORIDE 0.9 % IV SOLN: INTRAVENOUS | Status: DC

## 2016-01-17 MED ORDER — MIDAZOLAM HCL 2 MG/2ML IJ SOLN
INTRAMUSCULAR | Status: AC
  Filled 2016-01-17: qty 2

## 2016-01-17 MED ORDER — APIXABAN 5 MG PO TABS: 5.0000 mg | ORAL_TABLET | Freq: Two times a day (BID) | ORAL | Status: DC

## 2016-01-17 MED ORDER — DILTIAZEM HCL 60 MG PO TABS: 60.0000 mg | ORAL_TABLET | Freq: Three times a day (TID) | ORAL | Status: DC

## 2016-01-17 MED ORDER — METOPROLOL TARTRATE 25 MG PO TABS: 25.0000 mg | ORAL_TABLET | Freq: Two times a day (BID) | ORAL | Status: DC

## 2016-01-17 MED ORDER — SODIUM CHLORIDE 0.9% FLUSH: 3.0000 mL | Freq: Two times a day (BID) | INTRAVENOUS | Status: DC

## 2016-01-17 MED ORDER — MIDAZOLAM HCL 2 MG/2ML IJ SOLN
INTRAMUSCULAR | Status: DC | PRN
  Administered 2016-01-17: 0.5 mg via INTRAVENOUS
  Administered 2016-01-17: 1 mg via INTRAVENOUS
  Administered 2016-01-17: 0.5 mg via INTRAVENOUS

## 2016-01-17 MED ORDER — ONDANSETRON HCL 4 MG/2ML IJ SOLN: 4.0000 mg | Freq: Once | INTRAMUSCULAR | Status: DC | PRN

## 2016-01-17 MED ORDER — PROPOFOL 10 MG/ML IV BOLUS
INTRAVENOUS | Status: DC | PRN
  Administered 2016-01-17 (×3): 10 mg via INTRAVENOUS

## 2016-01-17 MED ORDER — SODIUM CHLORIDE 0.9% FLUSH: 3.0000 mL | INTRAVENOUS | Status: DC | PRN

## 2016-01-17 MED ORDER — PROPOFOL 10 MG/ML IV BOLUS
INTRAVENOUS | Status: AC
  Filled 2016-01-17: qty 20

## 2016-01-17 MED ORDER — FENTANYL CITRATE (PF) 100 MCG/2ML IJ SOLN
INTRAMUSCULAR | Status: AC
  Filled 2016-01-17: qty 2

## 2016-01-17 MED ORDER — LIDOCAINE VISCOUS 2 % MT SOLN
OROMUCOSAL | Status: AC
  Filled 2016-01-17: qty 15

## 2016-01-17 MED ORDER — FENTANYL CITRATE (PF) 100 MCG/2ML IJ SOLN
25.0000 ug | INTRAMUSCULAR | Status: DC | PRN
Start: 2016-01-17 — End: 2016-01-17

## 2016-01-17 MED ORDER — DIGOXIN 125 MCG PO TABS
0.2500 mg | ORAL_TABLET | Freq: Every day | ORAL | Status: DC
  Administered 2016-01-17: 0.25 mg via ORAL
  Filled 2016-01-17 (×2): qty 1
  Filled 2016-01-17: qty 2

## 2016-01-17 MED ORDER — FENTANYL CITRATE (PF) 100 MCG/2ML IJ SOLN
25.0000 ug | INTRAMUSCULAR | Status: AC
  Administered 2016-01-17 (×2): 25 ug via INTRAVENOUS

## 2016-01-17 NOTE — Addendum Note (Signed)
Addendum  created 01/17/16 1245 by Vista Deck, CRNA   Modules edited: Charges VN

## 2016-01-17 NOTE — Progress Notes (Signed)
Hearing aides returned to pt. Placed in bilateral ears by the pt.

## 2016-01-17 NOTE — Progress Notes (Signed)
Discharge instructions and prescriptions given, verbalized understanding, out in stable condition via w/c with staff. 

## 2016-01-17 NOTE — Anesthesia Preprocedure Evaluation (Signed)
Anesthesia Evaluation  Patient identified by MRN, date of birth, ID band Patient awake    Reviewed: Allergy & Precautions, NPO status , Patient's Chart, lab work & pertinent test results  Airway Mallampati: II  TM Distance: >3 FB     Dental  (+) Teeth Intact   Pulmonary former smoker,    breath sounds clear to auscultation       Cardiovascular hypertension, Pt. on medications + dysrhythmias Atrial Fibrillation  Rhythm:Regular Rate:Normal     Neuro/Psych    GI/Hepatic negative GI ROS,   Endo/Other  Hypothyroidism   Renal/GU      Musculoskeletal   Abdominal   Peds  Hematology  (+) Blood dyscrasia (CLL in remission), ,   Anesthesia Other Findings   Reproductive/Obstetrics                             Anesthesia Physical Anesthesia Plan  ASA: III  Anesthesia Plan: MAC   Post-op Pain Management:    Induction: Intravenous  Airway Management Planned: Simple Face Mask  Additional Equipment:   Intra-op Plan:   Post-operative Plan:   Informed Consent: I have reviewed the patients History and Physical, chart, labs and discussed the procedure including the risks, benefits and alternatives for the proposed anesthesia with the patient or authorized representative who has indicated his/her understanding and acceptance.     Plan Discussed with:   Anesthesia Plan Comments:         Anesthesia Quick Evaluation

## 2016-01-17 NOTE — Discharge Summary (Signed)
Physician Discharge Summary  Daisy Black DOB: July 11, 1951 DOA: 01/12/2016  PCP: Pcp Not In System  Admit date: 01/12/2016 Discharge date: 01/17/2016  Time spent: 45 minutes  Recommendations for Outpatient Follow-up:  -Will be discharged home today. -To follow up with cardiology as scheduled.   Discharge Diagnoses:  Principal Problem:   Atrial fibrillation with RVR ([place]) Active Problems:   Chest pain   Hyperlipidemia   Essential hypertension   Leukocytosis   Discharge Condition: Stable and improved  Filed Weights   01/12/16 1011 01/14/16 0347 01/15/16 0500  Weight: 105.6 kg (232 lb 12.9 oz) 104.4 kg (230 lb 2.6 oz) 105.1 kg (231 lb 11.3 oz)    History of present illness:  Daisy Black is a 65 y.o. female with a history of atrial fibrillation that converted after cardioversion approximately 5 years ago, has not had further recurrences of A. fib to her knowledge. During the past 3 weeks she has had episodes of feeling a little dizzy and a fluttering in her chest. Today she decided to come to the hospital because of chest discomfort and palpitations. She was found to be in atrial fibrillation with rapid ventricular response with a rate of about 140-150. She was given a Cardizem bolus in the ED and placed on an IV Cardizem drip, currently her rates have dropped into the 80-90 range. We have been asked to admit her for further evaluation and management.  Hospital Course:   Atrial fibrillation with rapid ventricular response -Rates improved in the upper 80s to 90s, into the 120s with movement, remains in A. fib.  -Continue Cardizem, metoprolol, digoxin. -Continue eliquis for anticoagulation. -Cardioversion was aborted due to mass in left atrial appendage suggestive of thrombus. -Will follow up with cardiology.  Hypertension -blood pressure low normal, continue current regimen.  Hyperlipidemia -Continue Lovaza.  Chronic lymphocytic leukemia -Continue  outpatient follow-up with oncology as scheduled, white blood cell count has remained stable around 20,000.  Hypothyroidism -Continue Synthroid, TSH within normal limits  Procedures: TEE: Normal LVEF 55-60%, trivial mitral and mild tricuspid regurgitation. Moderate left atrial enlargement. Mass noted in left atrial appendage with multiple views highly suspicious for thrombus.  Cardioversion not performed.  Consultations:  Cardiology  Discharge Instructions  Discharge Instructions    Diet - low sodium heart healthy    Complete by:  As directed      Increase activity slowly    Complete by:  As directed             Medication List    STOP taking these medications        aspirin 81 MG tablet     atenolol 25 MG tablet  Commonly known as:  TENORMIN     Salmon Oil 200 MG Caps      TAKE these medications        acetaminophen 650 MG CR tablet  Commonly known as:  TYLENOL  Take 650 mg by mouth every 8 (eight) hours as needed.     apixaban 5 MG Tabs tablet  Commonly known as:  ELIQUIS  Take 1 tablet (5 mg total) by mouth 2 (two) times daily.     digoxin 0.25 MG tablet  Commonly known as:  LANOXIN  Take 1 tablet (0.25 mg total) by mouth daily.     diltiazem 60 MG tablet  Commonly known as:  CARDIZEM  Take 1 tablet (60 mg total) by mouth 3 (three) times daily.     famotidine 20 MG  tablet  Commonly known as:  PEPCID  Take 20 mg by mouth 2 (two) times daily.     Iron 18 MG Tbcr  Take by mouth daily.     lansoprazole 15 MG capsule  Commonly known as:  PREVACID  Take 15 mg by mouth daily at 12 noon.     levothyroxine 125 MCG tablet  Commonly known as:  SYNTHROID, LEVOTHROID  Take 125 mcg by mouth daily before breakfast.     metoprolol tartrate 25 MG tablet  Commonly known as:  LOPRESSOR  Take 1 tablet (25 mg total) by mouth 2 (two) times daily.     PROBIOTIC PO  Take 1 capsule by mouth daily.     SALMON OIL PO  Take 1 capsule by mouth daily.       No  Known Allergies     Follow-up Information    Follow up with Jory Sims, NP On 01/24/2016.   Specialties:  Nurse Practitioner, Radiology, Cardiology   Why:  At 2:30pm   Contact information:   [place] [address] [phone]        The results of significant diagnostics from this hospitalization (including imaging, microbiology, ancillary and laboratory) are listed below for reference.    Significant Diagnostic Studies: Dg Chest 2 View  01/12/2016  CLINICAL DATA:  Chest pain with rapid heart rate for several hours this morning. EXAM: CHEST  2 VIEW COMPARISON:  08/03/2011 FINDINGS: Emphysematous changes and scattered fibrosis in the lungs. Normal heart size and pulmonary vascularity. No focal airspace disease or consolidation in the lungs. No blunting of costophrenic angles. No pneumothorax. Mediastinal contours appear intact. Degenerative changes in the spine. IMPRESSION: No active cardiopulmonary disease. Electronically Signed   By: Lucienne Capers M.D.   On: 01/12/2016 06:29    Microbiology: Recent Results (from the past 240 hour(s))  MRSA PCR Screening     Status: None   Collection Time: 01/12/16 10:00 AM  Result Value Ref Range Status   MRSA by PCR NEGATIVE NEGATIVE Final    Comment:        The GeneXpert MRSA Assay (FDA approved for NASAL specimens only), is one component of a comprehensive MRSA colonization surveillance program. It is not intended to diagnose MRSA infection nor to guide or monitor treatment for MRSA infections.      Labs: Basic Metabolic Panel:  Recent Labs Lab 01/12/16 0545 01/12/16 1536 01/13/16 0425 01/14/16 0523 01/16/16 0505  NA 141  --  142 140 141  K 4.1  --  3.6 3.9 3.8  CL 113*  --  113* 112* 111  CO2 25  --  22 25 27   GLUCOSE 112*  --  87 97 102*  BUN 17  --  12 13 17   CREATININE 0.59  --  0.55 0.68 0.73  CALCIUM 10.0  --  9.9 9.7 9.8  MG 2.2 2.2  --   --   --    Liver Function Tests: No results for  input(s): AST, ALT, ALKPHOS, BILITOT, PROT, ALBUMIN in the last 168 hours. No results for input(s): LIPASE, AMYLASE in the last 168 hours. No results for input(s): AMMONIA in the last 168 hours. CBC:  Recent Labs Lab 01/12/16 0545 01/13/16 0425 01/14/16 0523 01/16/16 0147 01/16/16 0505  WBC 21.2* 20.0* 21.8* 22.2* 19.5*  HGB 13.5 12.6 12.9 13.3 13.4  HCT 41.6 39.2 41.5 41.2 41.8  MCV 92.2 93.1 93.5 92.8 93.7  PLT 83* 79* 84* 98* 82*  Cardiac Enzymes:  Recent Labs Lab 01/12/16 0545 01/12/16 1536 01/12/16 2105 01/13/16 0425  TROPONINI <0.03 <0.03 <0.03 <0.03   BNP: BNP (last 3 results) No results for input(s): BNP in the last 8760 hours.  ProBNP (last 3 results) No results for input(s): PROBNP in the last 8760 hours.  CBG: No results for input(s): GLUCAP in the last 168 hours.     SignedLelon Frohlich  [facility] Pager: [phone] 01/17/2016, 4:10 PM

## 2016-01-17 NOTE — Procedures (Signed)
Preliminary report: Normal LVEF 55-60%, trivial mitral and mild tricuspid regurgitation. Moderate left atrial enlargement. Mass noted in left atrial appendage with multiple views highly suspicious for thrombus. Cardioversion not performed.

## 2016-01-17 NOTE — Progress Notes (Signed)
ANTICOAGULATION CONSULT NOTE - Initial Consult  Pharmacy Consult for Eliquis(apixaban) Indication: atrial fibrillation  No Known Allergies  Patient Measurements: Height: 5' 7.5" (171.5 cm) Weight: 231 lb 11.3 oz (105.1 kg) IBW/kg (Calculated) : 62.75  Vital Signs: Temp: 97.8 F (36.6 C) (06/20 0949) Temp Source: Oral (06/20 0843) BP: 118/68 mmHg (06/20 1045) Pulse Rate: 104 (06/20 1045)  Labs:  Recent Labs  01/16/16 0147 01/16/16 0505  HGB 13.3 13.4  HCT 41.2 41.8  PLT 98* 82*  CREATININE  --  0.73    Estimated Creatinine Clearance: 88.2 mL/min (by C-G formula based on Cr of 0.73).   Medical History: Past Medical History  Diagnosis Date  . Essential hypertension   . Hyperlipidemia   . Obesity   . Atrial fibrillation ([place])     Atrial fibrillation - onset in 09/2009; DC cardioversion in 10/2009  . Lymphocytosis   . TSH elevation   . CLL (chronic lymphocytic leukemia) ([facility])     Medications:  Prescriptions prior to admission  Medication Sig Dispense Refill Last Dose  . acetaminophen (TYLENOL) 650 MG CR tablet Take 650 mg by mouth every 8 (eight) hours as needed.     unknown  . aspirin 81 MG tablet Take 81 mg by mouth daily.     01/11/2016 at Unknown time  . atenolol (TENORMIN) 25 MG tablet Take 25 mg by mouth as needed. If atrial fib recurs and only palpitations may take 25mg  , if no betterin 1 hr take an additional 25mg  ; if no better call our office   01/12/2016 at 0100  . famotidine (PEPCID) 20 MG tablet Take 20 mg by mouth 2 (two) times daily.   01/11/2016 at Unknown time  . Ferrous Fumarate (IRON) 18 MG TBCR Take by mouth daily.   01/11/2016 at Unknown time  . lansoprazole (PREVACID) 15 MG capsule Take 15 mg by mouth daily at 12 noon.   01/11/2016 at Unknown time  . levothyroxine (SYNTHROID, LEVOTHROID) 125 MCG tablet Take 125 mcg by mouth daily before breakfast.   01/11/2016 at Unknown time  . Omega-3 Fatty Acids (SALMON OIL PO) Take 1 capsule by mouth daily.    01/12/2016 at Unknown time  . Omega-3 Fatty Acids (SALMON OIL) 200 MG CAPS Take 1 capsule by mouth daily.     01/11/2016 at Unknown time  . Probiotic Product (PROBIOTIC PO) Take 1 capsule by mouth daily.   01/11/2016 at Unknown time    Assessment: 65 yo female admitted with afib on 6/15. Seen by cardiology with plans for cardioversion. TEE showed suspicious mass in left atrial appendage suggestive of thrombus. Cardiology unable to proceed with cardioversion as a result of left atrial appendage thrombus. Restart eliquis.  Goal of Therapy:   Monitor platelets by anticoagulation protocol: Yes   Plan:  Eliquis 5mg  po bid Plan for 3 weeks of anticoagulation and do outpatient cardioversion.  Monitor for s/s of bleeding  Isac Sarna, BS Vena [place], BCPS Clinical Pharmacist Pager [phone]  01/17/2016,11:42 AM

## 2016-01-17 NOTE — Progress Notes (Signed)
SUBJECTIVE: Pt asymptomatic at rest but fatigued with palps with minimal exertion. TEE showed suspicious mass in left atrial appendage suggestive of thrombus.   ROS: Other than pertinent positives in "Subjective", all others were reviewed and found to be negative.   Intake/Output Summary (Last 24 hours) at 01/17/16 0954 Last data filed at 01/17/16 0953  Gross per 24 hour  Intake    540 ml  Output      0 ml  Net    540 ml    Current Facility-Administered Medications  Medication Dose Route Frequency Provider Last Rate Last Dose  . [MAR Hold] 0.9 %  sodium chloride infusion  250 mL Intravenous PRN Erline Hau, MD      . 0.9 %  sodium chloride infusion   Intravenous Continuous Lendon Colonel, NP      . 0.9 %  sodium chloride infusion  250 mL Intravenous Continuous Lendon Colonel, NP   0 mL at 01/16/16 1015  . 0.9 %  sodium chloride infusion  250 mL Intravenous Continuous Lendon Colonel, NP      . 0.9 %  sodium chloride infusion  250 mL Intravenous Continuous Herminio Commons, MD      . Doug Sou Hold] acetaminophen (TYLENOL) tablet 650 mg  650 mg Oral Q6H PRN Erline Hau, MD   650 mg at 01/15/16 1028   Or  . [MAR Hold] acetaminophen (TYLENOL) suppository 650 mg  650 mg Rectal Q6H PRN Erline Hau, MD      . Doug Sou Hold] apixaban [facility]) tablet 5 mg  5 mg Oral BID Erline Hau, MD   5 mg at 01/16/16 2039  . lactated ringers infusion   Intravenous Continuous Lerry Liner, MD 75 mL/hr at 01/17/16 [phone]    . [MAR Hold] levothyroxine (SYNTHROID, LEVOTHROID) tablet 125 mcg  125 mcg Oral QAC breakfast Erline Hau, MD   125 mcg at 01/16/16 [phone]  . [MAR Hold] omega-3 acid ethyl esters (LOVAZA) capsule 1 g  1 g Oral BID Erline Hau, MD   1 g at 01/16/16 2039  . [MAR Hold] ondansetron (ZOFRAN) tablet 4 mg  4 mg Oral Q6H PRN Erline Hau, MD       Or  . Doug Sou Hold] ondansetron [facility])  injection 4 mg  4 mg Intravenous Q6H PRN Erline Hau, MD      . Doug Sou Hold] pantoprazole (PROTONIX) EC tablet 40 mg  40 mg Oral Daily Erline Hau, MD   40 mg at 01/16/16 0900  . [MAR Hold] senna-docusate (Senokot-S) tablet 1 tablet  1 tablet Oral QHS PRN Erline Hau, MD      . [facility] Hold] sodium chloride flush (NS) 0.9 % injection 3 mL  3 mL Intravenous Q12H Estela Leonie Green, MD   3 mL at 01/16/16 0900  . [MAR Hold] sodium chloride flush (NS) 0.9 % injection 3 mL  3 mL Intravenous PRN Erline Hau, MD      . Doug Sou Hold] sodium chloride flush (NS) 0.9 % injection 3 mL  3 mL Intravenous Q12H Lendon Colonel, NP   0 mL at 01/16/16 1015  . [MAR Hold] sodium chloride flush (NS) 0.9 % injection 3 mL  3 mL Intravenous PRN Lendon Colonel, NP      . Doug Sou Hold] sodium chloride flush (NS) 0.9 % injection 3 mL  3 mL Intravenous Q12H Lendon Colonel, NP   3 mL at 01/16/16 2200  . [MAR Hold] sodium chloride flush (NS) 0.9 % injection 3 mL  3 mL Intravenous PRN Lendon Colonel, NP      . sodium chloride flush (NS) 0.9 % injection 3 mL  3 mL Intravenous Q12H Herminio Commons, MD      . sodium chloride flush (NS) 0.9 % injection 3 mL  3 mL Intravenous PRN Herminio Commons, MD        Filed Vitals:   01/17/16 0855 01/17/16 0900 01/17/16 0905 01/17/16 0910  BP:      Pulse:      Temp:      TempSrc:      Resp: 16 21 19 17   Height:      Weight:      SpO2: 95%       PHYSICAL EXAM General: NAD HEENT: Normal. Neck: No JVD, no thyromegaly.  Lungs: Clear to auscultation bilaterally with normal respiratory effort. CV: Nondisplaced PMI.  Tachycardic, irregular rhythm, normal S1/S2, no S3, no murmur.  No pretibial edema.  Abdomen: Soft, nontender, obese.  Neurologic: Alert and oriented x 3.  Psych: Normal affect. Musculoskeletal: Normal range of motion. No gross deformities. Extremities: No clubbing or cyanosis.   TELEMETRY:  Reviewed telemetry pt in rapid atrial fibrillation.  LABS: Basic Metabolic Panel:  Recent Labs  01/16/16 0505  NA 141  K 3.8  CL 111  CO2 27  GLUCOSE 102*  BUN 17  CREATININE 0.73  CALCIUM 9.8   Liver Function Tests: No results for input(s): AST, ALT, ALKPHOS, BILITOT, PROT, ALBUMIN in the last 72 hours. No results for input(s): LIPASE, AMYLASE in the last 72 hours. CBC:  Recent Labs  01/16/16 0147 01/16/16 0505  WBC 22.2* 19.5*  HGB 13.3 13.4  HCT 41.2 41.8  MCV 92.8 93.7  PLT 98* 82*   Cardiac Enzymes: No results for input(s): CKTOTAL, CKMB, CKMBINDEX, TROPONINI in the last 72 hours. BNP: Invalid input(s): POCBNP D-Dimer: No results for input(s): DDIMER in the last 72 hours. Hemoglobin A1C: No results for input(s): HGBA1C in the last 72 hours. Fasting Lipid Panel: No results for input(s): CHOL, HDL, LDLCALC, TRIG, CHOLHDL, LDLDIRECT in the last 72 hours. Thyroid Function Tests: No results for input(s): TSH, T4TOTAL, T3FREE, THYROIDAB in the last 72 hours.  Invalid input(s): FREET3 Anemia Panel: No results for input(s): VITAMINB12, FOLATE, FERRITIN, TIBC, IRON, RETICCTPCT in the last 72 hours.  RADIOLOGY: Dg Chest 2 View  01/12/2016  CLINICAL DATA:  Chest pain with rapid heart rate for several hours this morning. EXAM: CHEST  2 VIEW COMPARISON:  08/03/2011 FINDINGS: Emphysematous changes and scattered fibrosis in the lungs. Normal heart size and pulmonary vascularity. No focal airspace disease or consolidation in the lungs. No blunting of costophrenic angles. No pneumothorax. Mediastinal contours appear intact. Degenerative changes in the spine. IMPRESSION: No active cardiopulmonary disease. Electronically Signed   By: Lucienne Capers M.D.   On: 01/12/2016 06:29      ASSESSMENT AND PLAN: 1. Rapid atrial fibrillation: Unable to proceed with cardioversion as TEE showed probable left atrial appendage thrombus. Would resume all AV nodal blocking agents she was  taking yesterday. Continue anticoagulation for 3 weeks at which point an outpatient cardioversion can be pursued. I discussed all of this in great detail with her daughter and husband. I will also arrange for early follow up with Dr. Domenic Polite or NP.  2. Essential HTN: Low normal  in context of rapid atrial fibrillation.  Dispo: Once AV nodal blocking agents resumed, she can be discharged.  Time spent: 40 minutes, of which greater than 50% was spent reviewing symptoms, relevant blood tests and studies, and discussing management plan with the patient.    Kate Sable, M.D., F.A.C.C.

## 2016-01-17 NOTE — Anesthesia Postprocedure Evaluation (Signed)
Anesthesia Post Note  Patient: Daisy Black  Procedure(s) Performed: Procedure(s) (LRB): TRANSESOPHAGEAL ECHOCARDIOGRAM (TEE) (N/A)  Patient location during evaluation: PACU Anesthesia Type: MAC Level of consciousness: awake and alert Pain management: satisfactory to patient Respiratory status: spontaneous breathing and patient connected to nasal cannula oxygen Cardiovascular status: blood pressure returned to baseline Anesthetic complications: no    Last Vitals:  Filed Vitals:   01/17/16 0910 01/17/16 0949  BP:  94/59  Pulse:    Temp:    Resp: 17 24    Last Pain:  Filed Vitals:   01/17/16 1003  PainSc: 0-No pain                 Runell Kovich

## 2016-01-17 NOTE — H&P (View-Only) (Signed)
Primary Cardiologist: Gwen Her  Cardiology Specific Problem List: 1. Persistent Atrial fibrillation-CHADS VASC Score of 3.  2. Hypertension  Subjective:    Heart rate continues labile. She is symptomatic with this, feeling the fluttering and fatigue. HR goes up with minimal activity.   Objective:   Temp:  [97.1 F (36.2 C)] 97.1 F (36.2 C) (06/18 1700) Pulse Rate:  [71-119] 99 (06/19 0700) Resp:  [11-24] 14 (06/19 0700) BP: (82-109)/(49-77) 97/64 mmHg (06/19 0700) SpO2:  [95 %-100 %] 96 % (06/19 0700) Last BM Date: 01/11/16  Filed Weights   01/12/16 1011 01/14/16 0347 01/15/16 0500  Weight: 232 lb 12.9 oz (105.6 kg) 230 lb 2.6 oz (104.4 kg) 231 lb 11.3 oz (105.1 kg)    Intake/Output Summary (Last 24 hours) at 01/16/16 0806 Last data filed at 01/15/16 1704  Gross per 24 hour  Intake    603 ml  Output      0 ml  Net    603 ml    Telemetry: Atrial fib with rates in the 80's-120's   Exam:  General: No acute distress.  HEENT: Conjunctiva and lids normal, oropharynx clear.  Lungs: Clear to auscultation, nonlabored.  Cardiac: No elevated JVP or bruits. IRRR, no gallop or rub.   Abdomen: Normoactive bowel sounds, nontender, nondistended.  Extremities: No pitting edema, distal pulses full.  Neuropsychiatric: Alert and oriented x3, affect appropriate.  Echocardiogram 01/12/2016 Left ventricle: The cavity size was normal. Wall thickness was  normal. Systolic function was normal. The estimated ejection  fraction was in the range of 55% to 60%. Wall motion was normal;  there were no regional wall motion abnormalities. The study is  not technically sufficient to allow evaluation of LV diastolic  function. - Left atrium: The atrium was moderately dilated. - Atrial septum: A patent foramen ovale cannot be excluded.  Lab Results:  Basic Metabolic Panel:  Recent Labs Lab 01/12/16 0545 01/12/16 1536 01/13/16 0425 01/14/16 0523 01/16/16 0505  NA 141   --  142 140 141  K 4.1  --  3.6 3.9 3.8  CL 113*  --  113* 112* 111  CO2 25  --  22 25 27   GLUCOSE 112*  --  87 97 102*  BUN 17  --  12 13 17   CREATININE 0.59  --  0.55 0.68 0.73  CALCIUM 10.0  --  9.9 9.7 9.8  MG 2.2 2.2  --   --   --      CBC:  Recent Labs Lab 01/14/16 0523 01/16/16 0147 01/16/16 0505  WBC 21.8* 22.2* 19.5*  HGB 12.9 13.3 13.4  HCT 41.5 41.2 41.8  MCV 93.5 92.8 93.7  PLT 84* 98* 82*    Cardiac Enzymes:  Recent Labs Lab 01/12/16 1536 01/12/16 2105 01/13/16 0425  TROPONINI <0.03 <0.03 <0.03     Medications:   Scheduled Medications: . apixaban  5 mg Oral BID  . digoxin  0.125 mg Oral Daily  . diltiazem  30 mg Oral Q6H  . levothyroxine  125 mcg Oral QAC breakfast  . metoprolol tartrate  25 mg Oral BID  . omega-3 acid ethyl esters  1 g Oral BID  . pantoprazole  40 mg Oral Daily  . sodium chloride flush  3 mL Intravenous Q12H      PRN Medications: sodium chloride, acetaminophen **OR** acetaminophen, ondansetron **OR** ondansetron (ZOFRAN) IV, senna-docusate, sodium chloride flush   Assessment and Plan:   1.Atrial fib: She is symptomatic with this, "  I can't stand it." With associated fatigue and chest pressure. She continues on dilitazem 30 mg Q 6 hours and digoxin 0.125 mg daily.  She is anticoagulated on Eliquis 5mg  BID for last  5 days. Discussion with patient by Dr. Domenic Polite for possible TEE/DCCV was had last week. She would like to proceed as she is uncomfortable with irregular HR.  She is now NPO. Will discuss further with Dr. Bronson Ing concerning scheduling should he decide to proceed or chose pharmacologic alternative for conversion. Amiodarone is possibility.   2. Hypertension: BP is soft. Will not be able to increase diltiazem for better HR control. She is on no other antihypertensive mediations.   Phill Myron. Lawrence NP [place]  01/16/2016, 8:06 AM   The patient was seen and examined, and I agree with the history, physical exam,  assessment and plan as documented above which has been discussed with Arnold Long NP with modifications as noted below. Patient remains very symptomatic with minimal activity such as brushing her teeth. Comfortable only at rest. SBP remains low normal. Anticoagulated with Eliquis as noted above. Remains thrombocytopenic. Not much room for increasing dose of AV nodal blocking agents. Will plan for TEE/cardioversion tomorrow morning due to PACU scheduling.  Kate Sable, MD, [facility]  01/16/2016 9:17 AM

## 2016-01-17 NOTE — Anesthesia Procedure Notes (Signed)
Procedure Name: MAC Date/Time: 01/17/2016 9:17 AM Performed by: Vista Deck Pre-anesthesia Checklist: Patient identified, Emergency Drugs available, Suction available, Timeout performed and Patient being monitored Patient Re-evaluated:Patient Re-evaluated prior to inductionOxygen Delivery Method: Non-rebreather mask

## 2016-01-17 NOTE — Interval H&P Note (Signed)
History and Physical Interval Note: No changes. Proceed as planned.  01/17/2016 8:58 AM  Daisy Black  has presented today for surgery, with the diagnosis of Atrial fib  The various methods of treatment have been discussed with the patient and family. After consideration of risks, benefits and other options for treatment, the patient has consented to  Procedure(s): TRANSESOPHAGEAL ECHOCARDIOGRAM (TEE) (N/A) CARDIOVERSION (N/A) as a surgical intervention .  The patient's history has been reviewed, patient examined, no change in status, stable for surgery.  I have reviewed the patient's chart and labs.  Questions were answered to the patient's satisfaction.     Kate Sable

## 2016-01-17 NOTE — Transfer of Care (Signed)
Immediate Anesthesia Transfer of Care Note  Patient: Daisy Black  Procedure(s) Performed: Procedure(s): TRANSESOPHAGEAL ECHOCARDIOGRAM (TEE) (N/A) CARDIOVERSION (N/A)  Patient Location: PACU  Anesthesia Type:MAC  Level of Consciousness: awake and patient cooperative  Airway & Oxygen Therapy: Patient Spontanous Breathing and non-rebreather face mask  Post-op Assessment: Report given to RN, Post -op Vital signs reviewed and stable and Patient moving all extremities  Post vital signs: Reviewed and stable  Last Vitals:  Filed Vitals:   01/17/16 0905 01/17/16 0910  BP:    Pulse:    Temp:    Resp: 19 17    Last Pain:  Filed Vitals:   01/17/16 0914  PainSc: 0-No pain      Patients Stated Pain Goal: 6 ([phone] [id number])  Complications: No apparent anesthesia complications

## 2016-01-20 ENCOUNTER — Encounter (HOSPITAL_COMMUNITY): Payer: Self-pay | Admitting: Cardiovascular Disease

## 2016-01-24 ENCOUNTER — Ambulatory Visit (INDEPENDENT_AMBULATORY_CARE_PROVIDER_SITE_OTHER): Payer: Medicare Other | Admitting: Adult Health

## 2016-01-24 ENCOUNTER — Other Ambulatory Visit (HOSPITAL_COMMUNITY)
Admission: RE | Admit: 2016-01-24 | Discharge: 2016-01-24 | Disposition: A | Discharge status: Home / Self Care | Payer: Medicare Other | Source: Ambulatory Visit | Attending: Adult Health | Admitting: Adult Health

## 2016-01-24 ENCOUNTER — Encounter: Payer: Self-pay | Admitting: Adult Health

## 2016-01-24 VITALS — BP 102/60 | HR 93 | Ht 67.5 in | Wt 230.0 lb

## 2016-01-24 DIAGNOSIS — I4891 Unspecified atrial fibrillation: Secondary | ICD-10-CM | POA: Diagnosis present

## 2016-01-24 DIAGNOSIS — I10 Essential (primary) hypertension: Secondary | ICD-10-CM | POA: Diagnosis not present

## 2016-01-24 LAB — CBC WITH DIFFERENTIAL/PLATELET
BASOS PCT: 0 %
Basophils Absolute: 0 10*3/uL (ref 0.0–0.1)
EOS ABS: 0.2 10*3/uL (ref 0.0–0.7)
Eosinophils Relative: 1 %
HCT: 43.5 % (ref 36.0–46.0)
Hemoglobin: 13.8 g/dL (ref 12.0–15.0)
LYMPHS ABS: 25 10*3/uL — AB (ref 0.7–4.0)
Lymphocytes Relative: 86 %
MCH: 29.7 pg (ref 26.0–34.0)
MCHC: 31.7 g/dL (ref 30.0–36.0)
MCV: 93.8 fL (ref 78.0–100.0)
Monocytes Absolute: 0.4 10*3/uL (ref 0.1–1.0)
Monocytes Relative: 1 %
NEUTROS PCT: 12 %
Neutro Abs: 3.5 10*3/uL (ref 1.7–7.7)
PLATELETS: 103 10*3/uL — AB (ref 150–400)
RBC: 4.64 MIL/uL (ref 3.87–5.11)
RDW: 16.1 % — ABNORMAL HIGH (ref 11.5–15.5)
WBC: 29.1 10*3/uL — AB (ref 4.0–10.5)

## 2016-01-24 LAB — BASIC METABOLIC PANEL
ANION GAP: 2 — AB (ref 5–15)
BUN: 14 mg/dL (ref 6–20)
CHLORIDE: 110 mmol/L (ref 101–111)
CO2: 27 mmol/L (ref 22–32)
CREATININE: 0.74 mg/dL (ref 0.44–1.00)
Calcium: 9.9 mg/dL (ref 8.9–10.3)
GFR calc non Af Amer: >60 mL/min (ref >60)
Glucose, Bld: 100 mg/dL — ABNORMAL HIGH (ref 65–99)
Potassium: 4.2 mmol/L (ref 3.5–5.1)
SODIUM: 139 mmol/L (ref 135–145)

## 2016-01-24 MED ORDER — MAGNESIUM OXIDE -MG SUPPLEMENT 200 MG PO TABS: 200.0000 mg | ORAL_TABLET | Freq: Every day | ORAL | Status: DC

## 2016-01-24 MED ORDER — DILTIAZEM HCL ER COATED BEADS 120 MG PO CP24: 120.0000 mg | ORAL_CAPSULE | Freq: Every day | ORAL | Status: DC

## 2016-01-24 NOTE — Progress Notes (Signed)
Name: Daisy Black    DOB: [birth year]  Age: 65 y.o.  MR#: [id number]       PCP:  Pcp Not In System      Insurance: Payor: MEDICARE / Plan: MEDICARE PART A AND B / Product Type: *No Product type* /   CC:   No chief complaint on file.   VS Filed Vitals:   01/24/16 1416  Pulse: 93  Height: 5' 7.5" (1.715 m)  Weight: 230 lb (104.327 kg)  SpO2: 97%    Weights Current Weight  01/24/16 230 lb (104.327 kg)  01/15/16 231 lb 11.3 oz (105.1 kg)  12/28/15 234 lb 9.1 oz (106.4 kg)    Blood Pressure  BP Readings from Last 3 Encounters:  01/17/16 118/68  12/28/15 122/72  08/03/15 114/69     Admit date:  (Not on file) Last encounter with RMR:  Visit date not found   Allergy Review of patient's allergies indicates no known allergies.  Current Outpatient Prescriptions  Medication Sig Dispense Refill  . acetaminophen (TYLENOL) 650 MG CR tablet Take 650 mg by mouth every 8 (eight) hours as needed.      Marland Kitchen apixaban (ELIQUIS) 5 MG TABS tablet Take 1 tablet (5 mg total) by mouth 2 (two) times daily. 60 tablet 2  . digoxin (LANOXIN) 0.25 MG tablet Take 1 tablet (0.25 mg total) by mouth daily. 30 tablet 2  . diltiazem (CARDIZEM) 60 MG tablet Take 1 tablet (60 mg total) by mouth 3 (three) times daily. 90 tablet 2  . famotidine (PEPCID) 20 MG tablet Take 20 mg by mouth 2 (two) times daily.    . Ferrous Fumarate (IRON) 18 MG TBCR Take by mouth daily.    . lansoprazole (PREVACID) 15 MG capsule Take 15 mg by mouth daily at 12 noon.    Marland Kitchen levothyroxine (SYNTHROID, LEVOTHROID) 125 MCG tablet Take 125 mcg by mouth daily before breakfast.    . metoprolol tartrate (LOPRESSOR) 25 MG tablet Take 1 tablet (25 mg total) by mouth 2 (two) times daily. 60 tablet 2  . Omega-3 Fatty Acids (SALMON OIL PO) Take 1 capsule by mouth daily.    . Probiotic Product (PROBIOTIC PO) Take 1 capsule by mouth daily.     No current facility-administered medications for this visit.    Discontinued Meds:   There are no  discontinued medications.  Patient Active Problem List   Diagnosis Date Noted  . Chest pain 01/12/2016  . Atrial fibrillation with RVR ([place]) 01/12/2016  . Leukocytosis 01/12/2016  . Atrial fibrillation ([place])   . Anticoagulant long-term use 10/04/2010  . Family hx-arthritis 10/04/2010  . TSH elevation 10/04/2010  . KNEE, ARTHRITIS, DEGEN./OSTEO 01/25/2010  . Hyperlipidemia 11/09/2009  . OBESITY 11/09/2009  . LYMPHOCYTOSIS 11/01/2009  . Essential hypertension 11/01/2009    LABS    Component Value Date/Time   NA 141 01/16/2016 0505   NA 140 01/14/2016 0523   NA 142 01/13/2016 0425   K 3.8 01/16/2016 0505   K 3.9 01/14/2016 0523   K 3.6 01/13/2016 0425   CL 111 01/16/2016 0505   CL 112* 01/14/2016 0523   CL 113* 01/13/2016 0425   CO2 27 01/16/2016 0505   CO2 25 01/14/2016 0523   CO2 22 01/13/2016 0425   GLUCOSE 102* 01/16/2016 0505   GLUCOSE 97 01/14/2016 0523   GLUCOSE 87 01/13/2016 0425   BUN 17 01/16/2016 0505   BUN 13 01/14/2016 0523   BUN 12 01/13/2016 0425   CREATININE 0.73 01/16/2016  0505   CREATININE 0.68 01/14/2016 0523   CREATININE 0.55 01/13/2016 0425   CREATININE 0.76 03/14/2012 1507   CALCIUM 9.8 01/16/2016 0505   CALCIUM 9.7 01/14/2016 0523   CALCIUM 9.9 01/13/2016 0425   GFRNONAA >60 01/16/2016 0505   GFRNONAA >60 01/14/2016 0523   GFRNONAA >60 01/13/2016 0425   GFRNONAA >60 03/14/2012 1507   GFRAA >60 01/16/2016 0505   GFRAA >60 01/14/2016 0523   GFRAA >60 01/13/2016 0425   GFRAA >60 03/14/2012 1507   CMP     Component Value Date/Time   NA 141 01/16/2016 0505   K 3.8 01/16/2016 0505   CL 111 01/16/2016 0505   CO2 27 01/16/2016 0505   GLUCOSE 102* 01/16/2016 0505   BUN 17 01/16/2016 0505   CREATININE 0.73 01/16/2016 0505   CREATININE 0.76 03/14/2012 1507   CALCIUM 9.8 01/16/2016 0505   PROT 5.9* 10/10/2009 0452   ALBUMIN 3.4* 10/10/2009 0452   AST 21 10/10/2009 0452   ALT 17 10/10/2009 0452   ALKPHOS 91 10/10/2009 0452   BILITOT 0.6  10/10/2009 0452   GFRNONAA >60 01/16/2016 0505   GFRNONAA >60 03/14/2012 1507   GFRAA >60 01/16/2016 0505   GFRAA >60 03/14/2012 1507       Component Value Date/Time   WBC 19.5* 01/16/2016 0505   WBC 22.2* 01/16/2016 0147   WBC 21.8* 01/14/2016 0523   WBC 21.2* 03/14/2012 1507   HGB 13.4 01/16/2016 0505   HGB 13.3 01/16/2016 0147   HGB 12.9 01/14/2016 0523   HGB 13.6 03/14/2012 1507   HCT 41.8 01/16/2016 0505   HCT 41.2 01/16/2016 0147   HCT 41.5 01/14/2016 0523   HCT 42.7 03/14/2012 1507   MCV 93.7 01/16/2016 0505   MCV 92.8 01/16/2016 0147   MCV 93.5 01/14/2016 0523   MCV 92 03/14/2012 1507    Lipid Panel     Component Value Date/Time   CHOL 219* 11/30/2009 1950   TRIG 69 11/30/2009 1950   HDL 56 11/30/2009 1950   CHOLHDL 3.9 Ratio 11/30/2009 1950   VLDL 14 11/30/2009 1950   LDLCALC 149* 11/30/2009 1950    ABG No results found for: PHART, PCO2ART, PO2ART, HCO3, TCO2, ACIDBASEDEF, O2SAT   Lab Results  Component Value Date   TSH 1.986 01/12/2016   BNP (last 3 results) No results for input(s): BNP in the last 8760 hours.  ProBNP (last 3 results) No results for input(s): PROBNP in the last 8760 hours.  Cardiac Panel (last 3 results) No results for input(s): CKTOTAL, CKMB, TROPONINI, RELINDX in the last 72 hours.  Iron/TIBC/Ferritin/ %Sat    Component Value Date/Time   FERRITIN 29 10/08/2009 1903     EKG Orders placed or performed during the hospital encounter of 01/12/16  . EKG 12-Lead  . EKG 12-Lead  . EKG  . EKG 12-Lead  . EKG 12-Lead  . EKG 12-Lead  . EKG 12-Lead  . EKG 12-Lead  . EKG 12-Lead     Prior Assessment and Plan Problem List as of 01/24/2016      Cardiovascular and Mediastinum   Essential hypertension   Last Assessment & Plan 11/16/2010 Office Visit Written 11/16/2010  1:18 PM by Yehuda Savannah, MD    Blood pressure control is excellent, and the patient would like to simplify her medical regime.  She will stop taking atenolol on  a routine basis and monitor blood pressures at home.  She will call for values above [id number] systolic or 90 diastolic.  Otherwise,  she will use atenolol p.r.n. For palpitations.      Atrial fibrillation [facility])   Last Assessment & Plan 11/16/2010 Office Visit Written 11/16/2010  1:16 PM by Yehuda Savannah, MD    There has been no clinical recurrence of atrial fibrillation.  I explained to the patient that reoccurrence of her arrhythmia is likely in the future, but unpredictable.  Should she experience recurrent palpitations, she will use additional doses of atenolol up to a total of 50 mg.  If that is not successful, she will call our office at the time of her arrhythmia or early the next morning.  If she experiences significant associated symptoms such as dyspnea or chest discomfort, she will seek care in the emergency department.      Atrial fibrillation with RVR (HCC)     Musculoskeletal and Integument   KNEE, ARTHRITIS, DEGEN./OSTEO     Other   Hyperlipidemia   Last Assessment & Plan 11/16/2010 Office Visit Edited 11/25/2010  2:33 PM by Yehuda Savannah, MD    Most recent lipid profile available to me showed moderate hyperlipidemia but does not meet criteria for pharmacologic therapy.  Patient was told at her most recent visit to the [facility] that values had improved.      OBESITY   LYMPHOCYTOSIS   Anticoagulant long-term use   Family hx-arthritis   TSH elevation   Chest pain   Leukocytosis       Imaging: Dg Chest 2 View  01/12/2016  CLINICAL DATA:  Chest pain with rapid heart rate for several hours this morning. EXAM: CHEST  2 VIEW COMPARISON:  08/03/2011 FINDINGS: Emphysematous changes and scattered fibrosis in the lungs. Normal heart size and pulmonary vascularity. No focal airspace disease or consolidation in the lungs. No blunting of costophrenic angles. No pneumothorax. Mediastinal contours appear intact. Degenerative changes in the spine. IMPRESSION: No active  cardiopulmonary disease. Electronically Signed   By: Lucienne Capers M.D.   On: 01/12/2016 06:29

## 2016-01-24 NOTE — Progress Notes (Signed)
Cardiology Office Note   Date:  01/24/2016   ID:  Daisy, Black 08/24/1950, MRN [id number]  PCP:  Pcp Not In System  Cardiologist: Woodroe Chen, NP   Chief Complaint  Patient presents with  . Atrial Fibrillation    Thrombus LA      History of Present Illness: Daisy Black is a 65 y.o. female who presents for ongoing assessment and management of atrial fibrillation with RVR, chest pain, and hypertension. The patient was recently hospitalized on 01/17/2016. At the time she is having episodes of feeling dizzy with atrial flutter, she was found to be in atrial fib with RVR with race 250 beats per minute. She was planned for a TEE cardioversion. This was canceled however after it was found that she had a mass in the left atrial appendage suggestive of a thrombus. She was continued on metoprolol, diltiazem, and digoxin.  TEE: Normal LVEF 55-60%, trivial mitral and mild tricuspid regurgitation. Moderate left atrial enlargement. Mass noted in left atrial appendage with multiple views highly suspicious for thrombus. She was continued on ELIQUIS 5 mg twice a day, digoxin 0.25 mg daily, and diltiazem 60 mg 3 times a day. Aspirin and atenolol were discontinued.  She states today that she is feeling more tired than ever. She is anxious to have a cardioversion completed. She said her energy level is very low. She denies any bleeding issues dizziness or near syncope.  Past Medical History  Diagnosis Date  . Essential hypertension   . Hyperlipidemia   . Obesity   . Atrial fibrillation ([place])     Atrial fibrillation - onset in 09/2009; DC cardioversion in 10/2009  . Lymphocytosis   . TSH elevation   . CLL (chronic lymphocytic leukemia) ([place])     Past Surgical History  Procedure Laterality Date  . Hernia repair  2011    Incisional and umbilical utilizing mesh  . Cholecystectomy    . Rotator cuff repair    . Ankle surgery Right   . Tonsillectomy    . Tee without  cardioversion N/A 01/17/2016    Procedure: TRANSESOPHAGEAL ECHOCARDIOGRAM (TEE);  Surgeon: Herminio Commons, MD;  Location: [facility];  Service: Cardiovascular;  Laterality: N/A;     Current Outpatient Prescriptions  Medication Sig Dispense Refill  . acetaminophen (TYLENOL) 650 MG CR tablet Take 650 mg by mouth every 8 (eight) hours as needed.      Marland Kitchen apixaban (ELIQUIS) 5 MG TABS tablet Take 1 tablet (5 mg total) by mouth 2 (two) times daily. 60 tablet 2  . digoxin (LANOXIN) 0.25 MG tablet Take 1 tablet (0.25 mg total) by mouth daily. 30 tablet 2  . diltiazem (CARDIZEM) 60 MG tablet Take 1 tablet (60 mg total) by mouth 3 (three) times daily. 90 tablet 2  . famotidine (PEPCID) 20 MG tablet Take 20 mg by mouth 2 (two) times daily.    . Ferrous Fumarate (IRON) 18 MG TBCR Take by mouth daily.    . lansoprazole (PREVACID) 15 MG capsule Take 15 mg by mouth daily at 12 noon.    Marland Kitchen levothyroxine (SYNTHROID, LEVOTHROID) 125 MCG tablet Take 125 mcg by mouth daily before breakfast.    . metoprolol tartrate (LOPRESSOR) 25 MG tablet Take 1 tablet (25 mg total) by mouth 2 (two) times daily. 60 tablet 2  . Omega-3 Fatty Acids (SALMON OIL PO) Take 1 capsule by mouth daily.    . Probiotic Product (PROBIOTIC PO) Take 1 capsule by mouth daily.  No current facility-administered medications for this visit.    Allergies:   Review of patient's allergies indicates no known allergies.    Social History:  The patient  reports that she quit smoking about 40 years ago. Her smoking use included Cigarettes. She has a .5 pack-year smoking history. She has never used smokeless tobacco. She reports that she does not drink alcohol or use illicit drugs.   Family History:  The patient's family history includes Breast cancer in her paternal aunt; Leukemia in her mother; Ovarian cancer (age of onset: 34) in her mother; Stroke (age of onset: 41) in her father.    ROS: All other systems are reviewed and negative. Unless  otherwise mentioned in H&P    PHYSICAL EXAM: VS:  BP 102/60 mmHg  Pulse 93  Ht 5' 7.5" (1.715 m)  Wt 230 lb (104.327 kg)  BMI 35.47 kg/m2  SpO2 97% , BMI Body mass index is 35.47 kg/(m^2). GEN: Well nourished, well developed, in no acute distress HEENT: normal Neck: no JVD, carotid bruits, or masses Cardiac: IRRR; bradycardic, no murmurs, rubs, or gallops,no edema  Respiratory:  clear to auscultation bilaterally, normal work of breathing GI: soft, nontender, nondistended, + BS MS: no deformity or atrophy Skin: warm and dry, no rash Neuro:  Strength and sensation are intact Psych: euthymic mood, full affect   Recent Labs: 01/12/2016: Magnesium 2.2; TSH 1.986 01/16/2016: BUN 17; Creatinine, Ser 0.73; Hemoglobin 13.4; Platelets 82*; Potassium 3.8; Sodium 141    Lipid Panel    Component Value Date/Time   CHOL 219* 11/30/2009 1950   TRIG 69 11/30/2009 1950   HDL 56 11/30/2009 1950   CHOLHDL 3.9 Ratio 11/30/2009 1950   VLDL 14 11/30/2009 1950   LDLCALC 149* 11/30/2009 1950      Wt Readings from Last 3 Encounters:  01/24/16 230 lb (104.327 kg)  01/15/16 231 lb 11.3 oz (105.1 kg)  12/28/15 234 lb 9.1 oz (106.4 kg)    ASSESSMENT AND PLAN:  1. Atrial fibrillation: On examination heart rate is well-controlled. Blood pressure is soft. I will change her diltiazem to 120 mg long-acting daily to help with her blood pressure which may be causing her to feel so tired. The next step may be to increase the Toprol slightly and make that long-acting as well to be taken during the daytime should her heart rate increased. She will continue ELIQUIS as directed. I will have her followup with Dr. Domenic Polite in one month says that they can discuss TEE cardioversion in the setting of left atrial thrombus. I will check a CBC and BMET.  2. Hypertension:blood pressure is soft today. Cutting back on medication may be helpful. She is to call us to let us know if this has made a difference for her or if  she is feeling worse or if her heart rate has increased. We may have to make further adjustments.  3.GERD: she is on both Pepcid and Protonix  I will start her on low-dose magnesium 2 mg daily. She is to followup with her primary care with GI as previously scheduled.   Current medicines are reviewed at length with the patient today.    Labs/ tests ordered today include: BMET and CBC. No orders of the defined types were placed in this encounter.     Disposition:   FU with Dr. Domenic Polite one month.  Signed, Jory Sims, NP  01/24/2016 2:24 PM    [facility] [address], [place], [place]  [place] Phone: [phone]; Fax: [phone]

## 2016-01-24 NOTE — Patient Instructions (Signed)
Your physician recommends that you schedule a follow-up appointment in: 1 Month with Dr. Domenic Polite.  Your physician has recommended you make the following change in your medication:  Magnesium 200 mg Daily  Cardizem 120 mg Daily at Bedtime  Your physician recommends that you have lab work done today.  If you need a refill on your cardiac medications before your next appointment, please call your pharmacy.  Thank you for choosing [facility]!

## 2016-01-25 ENCOUNTER — Telehealth: Payer: Self-pay | Admitting: Adult Health

## 2016-01-25 NOTE — Telephone Encounter (Signed)
Patient would like to speak with nurse regarding medication changes from OV on 6/27/tg

## 2016-01-25 NOTE — Telephone Encounter (Signed)
Pt complains that she is still having times that her heart rate is very fast ( 200's)  and very low (50's). She was wondering if she needs to go back to taking the diltiazem 60 mg tid or give the cardizem 120 mg a few more days to see if it helps. Please advise.

## 2016-01-26 ENCOUNTER — Encounter: Payer: Self-pay | Admitting: Adult Health

## 2016-01-26 DIAGNOSIS — C9110 Chronic lymphocytic leukemia of B-cell type not having achieved remission: Secondary | ICD-10-CM | POA: Insufficient documentation

## 2016-01-26 NOTE — Telephone Encounter (Signed)
Relayed Daisy Black's message to patient,she verbalized understanding.

## 2016-01-26 NOTE — Telephone Encounter (Signed)
[id number] 

## 2016-01-26 NOTE — Telephone Encounter (Signed)
Yes, she can go back on 60 mg TID. Make sure she has follow up with cardiologist for further recommendations.

## 2016-02-07 ENCOUNTER — Telehealth: Payer: Self-pay | Admitting: Cardiology

## 2016-02-07 NOTE — Telephone Encounter (Signed)
Wants a handwritten script for eliquis

## 2016-02-07 NOTE — Telephone Encounter (Signed)
rx at front desk,pt aware

## 2016-02-16 ENCOUNTER — Other Ambulatory Visit: Payer: Self-pay | Admitting: Cardiology

## 2016-02-16 ENCOUNTER — Ambulatory Visit (INDEPENDENT_AMBULATORY_CARE_PROVIDER_SITE_OTHER): Payer: Medicare Other | Admitting: Cardiology

## 2016-02-16 ENCOUNTER — Encounter: Payer: Self-pay | Admitting: Cardiology

## 2016-02-16 VITALS — BP 92/60 | HR 59 | Ht 67.0 in | Wt 231.0 lb

## 2016-02-16 DIAGNOSIS — E785 Hyperlipidemia, unspecified: Secondary | ICD-10-CM | POA: Diagnosis not present

## 2016-02-16 DIAGNOSIS — I5189 Other ill-defined heart diseases: Secondary | ICD-10-CM

## 2016-02-16 DIAGNOSIS — I481 Persistent atrial fibrillation: Secondary | ICD-10-CM | POA: Diagnosis not present

## 2016-02-16 DIAGNOSIS — I4819 Other persistent atrial fibrillation: Secondary | ICD-10-CM

## 2016-02-16 DIAGNOSIS — I10 Essential (primary) hypertension: Secondary | ICD-10-CM

## 2016-02-16 DIAGNOSIS — Z9889 Other specified postprocedural states: Secondary | ICD-10-CM

## 2016-02-16 DIAGNOSIS — I513 Intracardiac thrombosis, not elsewhere classified: Secondary | ICD-10-CM

## 2016-02-16 DIAGNOSIS — Z9289 Personal history of other medical treatment: Secondary | ICD-10-CM

## 2016-02-16 MED ORDER — PROMETHAZINE HCL 12.5 MG PO TABS: 12.5000 mg | ORAL_TABLET | Freq: Four times a day (QID) | ORAL | Status: DC | PRN

## 2016-02-16 NOTE — Patient Instructions (Addendum)
Your physician recommends that you schedule a follow-up appointment in: to be determined after cardioversion, f/u in 4 weeks  Pre op eval with anesthesia on 02/20/16 at 9 am,register in Short Stay  Your physician has requested that you have a TEE. During a TEE, sound waves are used to create images of your heart. It provides your doctor with information about the size and shape of your heart and how well your heart's chambers and valves are working. In this test, a transducer is attached to the end of a flexible tube that's guided down your throat and into your esophagus (the tube leading from you mouth to your stomach) to get a more detailed image of your heart. You are not awake for the procedure. Please see the instruction sheet given to you today. For further information please visit [email or web address].  Cardioversion is 02/21/16   At 8 am,register at Short Stay   HOLD DILTIAZEM MORNING OF TEE/CARDIOVERSION    Take Phenergan as needed for nausea    Thank you for choosing [facility] !

## 2016-02-16 NOTE — Progress Notes (Signed)
  Cardiology Office Note  Date: 02/16/2016   ID: Daisy Black, DOB 07/11/1951, MRN [id number]  PCP: Strader, Lindsey F, NP  Primary Cardiologist: Warnell Rasnic, MD   Chief Complaint  Patient presents with  . Atrial Fibrillation    History of Present Illness: Daisy Black is a 65 y.o. female seen recently in the office by Ms. Lawrence NP on June 27. I met her for the first time in consultation in June. She has recently documented persistent atrial fibrillation with prior history of PAF. CHADSVASC score is 3. She has been anticoagulated with Eliquis and treated with heart rate control strategy. She was to undergo TEE guided cardioversion on June 20, noted to have a left atrial appendage thrombus at that time by Dr. Koneswaran with cardioversion deferred.  She comes in today with her husband to discuss a repeat cardioversion attempt. She reports feeling washed out although heart rate is better controlled. Also complains of nausea on diltiazem. She has been using when necessary Phenergan for this. She has had no chest pain. Denies any bleeding problems. No obvious neurological symptoms.  Past Medical History  Diagnosis Date  . Essential hypertension   . Hyperlipidemia   . Obesity   . Atrial fibrillation (H[facility])     Atrial fibrillation - onset in 09/2009; DC cardioversion in 10/2009  . Lymphocytosis   . TSH elevation   . CLL (chronic lymphocytic leukemia) ([facility])     Past Surgical History  Procedure Laterality Date  . Hernia repair  2011    Incisional and umbilical utilizing mesh  . Cholecystectomy    . Rotator cuff repair    . Ankle surgery Right   . Tonsillectomy    . Tee without cardioversion N/A 01/17/2016    Procedure: TRANSESOPHAGEAL ECHOCARDIOGRAM (TEE);  Surgeon: Suresh A Koneswaran, MD;  Location: [facility];  Service: Cardiovascular;  Laterality: N/A;    Current Outpatient Prescriptions  Medication Sig Dispense Refill  . acetaminophen (TYLENOL) 650 MG CR tablet Take  650 mg by mouth every 8 (eight) hours as needed.      . apixaban (ELIQUIS) 5 MG TABS tablet Take 1 tablet (5 mg total) by mouth 2 (two) times daily. 60 tablet 2  . digoxin (LANOXIN) 0.25 MG tablet Take 1 tablet (0.25 mg total) by mouth daily. 30 tablet 2  . diltiazem (CARDIZEM) 60 MG tablet Take 60 mg by mouth 3 (three) times daily.   2  . famotidine (PEPCID) 20 MG tablet Take 20 mg by mouth 2 (two) times daily.    . Ferrous Fumarate (IRON) 18 MG TBCR Take by mouth daily.    . lansoprazole (PREVACID) 15 MG capsule Take 15 mg by mouth daily at 12 noon.    . levothyroxine (SYNTHROID, LEVOTHROID) 125 MCG tablet Take 125 mcg by mouth daily before breakfast.    . Magnesium Oxide 200 MG TABS Take 1 tablet (200 mg total) by mouth daily. 30 tablet 6  . metoprolol tartrate (LOPRESSOR) 25 MG tablet Take 1 tablet (25 mg total) by mouth 2 (two) times daily. 60 tablet 2  . Probiotic Product (PROBIOTIC PO) Take 1 capsule by mouth daily.     No current facility-administered medications for this visit.   Allergies:  Review of patient's allergies indicates no known allergies.   Social History: The patient  reports that she quit smoking about 40 years ago. Her smoking use included Cigarettes. She has a .5 pack-year smoking history. She has never used smokeless tobacco. She reports   that she does not drink alcohol or use illicit drugs.   ROS:  Please see the history of present illness. Otherwise, complete review of systems is positive for fatigue.  All other systems are reviewed and negative.   Physical Exam: VS:  BP 92/60 mmHg  Pulse 59  Ht 5' 7" (1.702 m)  Wt 231 lb (104.781 kg)  BMI 36.17 kg/m2  SpO2 98%, BMI Body mass index is 36.17 kg/(m^2).  Wt Readings from Last 3 Encounters:  02/16/16 231 lb (104.781 kg)  01/24/16 230 lb (104.327 kg)  01/15/16 231 lb 11.3 oz (105.1 kg)    General: Overweight woman, appears comfortable at rest. HEENT: Conjunctiva and lids normal, oropharynx clear. Neck: Supple,  no elevated JVP or carotid bruits, no thyromegaly. Lungs: Clear to auscultation, nonlabored breathing at rest. Cardiac: Irregularly irregular, no S3 or significant systolic murmur, no pericardial rub. Abdomen: Soft, nontender, bowel sounds present, no guarding or rebound. Extremities: No pitting edema, distal pulses 2+. Skin: Warm and dry. Musculoskeletal: No kyphosis. Neuropsychiatric: Alert and oriented x3, affect grossly appropriate.  ECG: I personally reviewed the tracing from 01/17/2016 which showed atrial fibrillation at 109 bpm with inferior Q wave.  Recent Labwork: 01/12/2016: Magnesium 2.2; TSH 1.986 01/24/2016: BUN 14; Creatinine, Ser 0.74; Hemoglobin 13.8; Platelets 103*; Potassium 4.2; Sodium 139     Component Value Date/Time   CHOL 219* 11/30/2009 1950   TRIG 69 11/30/2009 1950   HDL 56 11/30/2009 1950   CHOLHDL 3.9 Ratio 11/30/2009 1950   VLDL 14 11/30/2009 1950   LDLCALC 149* 11/30/2009 1950    Other Studies Reviewed Today:  TEE 01/17/2016: Study Conclusions  - Left ventricle: Systolic function was normal. The estimated  ejection fraction was in the range of 55% to 60%. Wall motion was  normal; there were no regional wall motion abnormalities. - Mitral valve: There was trivial regurgitation. - Left atrium: There was a highly suspicious mass in the left  atrial appendage seen in multiple views suggestive of thrombus.  The atrium was moderately dilated. - Tricuspid valve: There was mild regurgitation.  Assessment and Plan:  1. Persistent symptomatic atrial fibrillation as outlined above. Patient continues to complain of fatigue despite adequate heart rate control, low-normal blood pressure as well on medical therapy. She presents to discuss repeat cardioversion attempt after finding of left atrial appendage thrombus by TEE on June 20. She reports compliance with Eliquis, has had no neurological symptoms. She would like to get the procedure scheduled as soon as  possible. Earliest availability is next week, Dr. Koneswaran is covering the hospital at that time. Although follow-up TEE is not always indicated after 4 weeks of anticoagulation, since she had a definitive left atrial appendage thrombus, we will go ahead and pursue a TEE guided cardioversion once again just to make certain that this has dissipated. She will hold Cardizem dose on the morning of the procedure, can hopefully stop it altogether if she is able to get back in sinus rhythm.  2. Essential hypertension, blood pressure low normal at this time.  3. Hyperlipidemia, managed by diet.  Current medicines were reviewed with the patient today.  Disposition: Follow-up with me after cardioversion.  Signed, Millie Shorb G. Alisen Marsiglia, MD, FACC 02/16/2016 1:34 PM    [facility] [place] [address], [place], [place] [address] Phone: [phone]; Fax: [phone]  

## 2016-02-17 NOTE — Patient Instructions (Signed)
Daisy Black  02/17/2016     @PREFPERIOPPHARMACY @   Your procedure is scheduled on  02/21/2016   Report to [facility] at  82  A.M.  Call this number if you have problems the morning of surgery:  [phone]   Remember:  Do not eat food or drink liquids after midnight.  Take these medicines the morning of surgery with A SIP OF WATER  Eliquis, digoxin, prevacid, levothyroxine, phenergan.   Do not wear jewelry, make-up or nail polish.  Do not wear lotions, powders, or perfumes.  You may wear deoderant.  Do not shave 48 hours prior to surgery.  Men may shave face and neck.  Do not bring valuables to the hospital.  [facility] is not responsible for any belongings or valuables.  Contacts, dentures or bridgework may not be worn into surgery.  Leave your suitcase in the car.  After surgery it may be brought to your room.  For patients admitted to the hospital, discharge time will be determined by your treatment team.  Patients discharged the day of surgery will not be allowed to drive home.   Name and phone number of your driver:   family Special instructions:  none  Please read over the following fact sheets that you were given. Coughing and Deep Breathing, Surgical Site Infection Prevention, Anesthesia Post-op Instructions and Care and Recovery After Surgery      Electrical Cardioversion Electrical cardioversion is the delivery of a jolt of electricity to change the rhythm of the heart. Sticky patches or metal paddles are placed on the chest to deliver the electricity from a device. This is done to restore a normal rhythm. A rhythm that is too fast or not regular keeps the heart from pumping well. Electrical cardioversion is done in an emergency if:   There is low or no blood pressure as a result of the heart rhythm.   Normal rhythm must be restored as fast as possible to protect the brain and heart from further damage.   It may save a  life. Cardioversion may be done for heart rhythms that are not immediately life threatening, such as atrial fibrillation or flutter, in which:   The heart is beating too fast or is not regular.   Medicine to change the rhythm has not worked.   It is safe to wait in order to allow time for preparation.  Symptoms of the abnormal rhythm are bothersome.  The risk of stroke and other serious problems can be reduced. LET [facility] CARE PROVIDER KNOW ABOUT:   Any allergies you have.  All medicines you are taking, including vitamins, herbs, eye drops, creams, and over-the-counter medicines.  Previous problems you or members of your family have had with the use of anesthetics.   Any blood disorders you have.   Previous surgeries you have had.   Medical conditions you have. RISKS AND COMPLICATIONS  Generally, this is a safe procedure. However, problems can occur and include:   Breathing problems related to the anesthetic used.  A blood clot that breaks free and travels to other parts of your body. This could cause a stroke or other problems. The risk of this is lowered by use of blood-thinning medicine (anticoagulant) prior to the procedure.  Cardiac arrest (rare). BEFORE THE PROCEDURE   You may have tests to detect blood clots in your heart and to evaluate heart function.  You may start taking anticoagulants so  your blood does not clot as easily.   Medicines may be given to help stabilize your heart rate and rhythm. PROCEDURE  You will be given medicine through an IV tube to reduce discomfort and make you sleepy (sedative).   An electrical shock will be delivered. AFTER THE PROCEDURE Your heart rhythm will be watched to make sure it does not change.    This information is not intended to replace advice given to you by your health care provider. Make sure you discuss any questions you have with your health care provider.   Document Released: 07/06/2002 Document  Revised: 08/06/2014 Document Reviewed: 01/28/2013 Elsevier Interactive Patient Education 2016 [employer]. Electrical Cardioversion, Care After Refer to this sheet in the next few weeks. These instructions provide you with information on caring for yourself after your procedure. Your health care provider may also give you more specific instructions. Your treatment has been planned according to current medical practices, but problems sometimes occur. Call your health care provider if you have any problems or questions after your procedure. WHAT TO EXPECT AFTER THE PROCEDURE After your procedure, it is typical to have the following sensations:  Some redness on the skin where the shocks were delivered. If this is tender, a sunburn lotion or hydrocortisone cream may help.  Possible return of an abnormal heart rhythm within hours or days after the procedure. HOME CARE INSTRUCTIONS  Take medicines only as directed by your health care provider. Be sure you understand how and when to take your medicine.  Learn how to feel your pulse and check it often.  Limit your activity for 48 hours after the procedure or as directed by your health care provider.  Avoid or minimize caffeine and other stimulants as directed by your health care provider. SEEK MEDICAL CARE IF:  You feel like your heart is beating too fast or your pulse is not regular.  You have any questions about your medicines.  You have bleeding that will not stop. SEEK IMMEDIATE MEDICAL CARE IF:  You are dizzy or feel faint.  It is hard to breathe or you feel short of breath.  There is a change in discomfort in your chest.  Your speech is slurred or you have trouble moving an arm or leg on one side of your body.  You get a serious muscle cramp that does not go away.  Your fingers or toes turn cold or blue.   This information is not intended to replace advice given to you by your health care provider. Make sure you discuss any  questions you have with your health care provider.   Document Released: 05/06/2013 Document Revised: 08/06/2014 Document Reviewed: 05/06/2013 Elsevier Interactive Patient Education 2016 [place] Monitored anesthesia care is an anesthesia service for a medical procedure. Anesthesia is the loss of the ability to feel pain. It is produced by medicines called anesthetics. It may affect a small area of your body (local anesthesia), a large area of your body (regional anesthesia), or your entire body (general anesthesia). The need for monitored anesthesia care depends your procedure, your condition, and the potential need for regional or general anesthesia. It is often provided during procedures where:   General anesthesia may be needed if there are complications. This is because you need special care when you are under general anesthesia.   You will be under local or regional anesthesia. This is so that you are able to have higher levels of anesthesia if needed.  You will receive calming medicines (sedatives). This is especially the case if sedatives are given to put you in a semi-conscious state of relaxation (deep sedation). This is because the amount of sedative needed to produce this state can be hard to predict. Too much of a sedative can produce general anesthesia. Monitored anesthesia care is performed by one or more health care providers who have special training in all types of anesthesia. You will need to meet with these health care providers before your procedure. During this meeting, they will ask you about your medical history. They will also give you instructions to follow. (For example, you will need to stop eating and drinking before your procedure. You may also need to stop or change medicines you are taking.) During your procedure, your health care providers will stay with you. They will:   Watch your condition. This includes watching your blood pressure,  breathing, and level of pain.   Diagnose and treat problems that occur.   Give medicines if they are needed. These may include calming medicines (sedatives) and anesthetics.   Make sure you are comfortable.  Having monitored anesthesia care does not necessarily mean that you will be under anesthesia. It does mean that your health care providers will be able to manage anesthesia if you need it or if it occurs. It also means that you will be able to have a different type of anesthesia than you are having if you need it. When your procedure is complete, your health care providers will continue to watch your condition. They will make sure any medicines wear off before you are allowed to go home.    This information is not intended to replace advice given to you by your health care provider. Make sure you discuss any questions you have with your health care provider.   Document Released: 04/11/2005 Document Revised: 08/06/2014 Document Reviewed: 08/27/2012 Elsevier Interactive Patient Education [employer].

## 2016-02-20 ENCOUNTER — Encounter (HOSPITAL_COMMUNITY): Payer: Self-pay

## 2016-02-20 ENCOUNTER — Encounter (HOSPITAL_COMMUNITY)
Admission: RE | Admit: 2016-02-20 | Discharge: 2016-02-20 | Disposition: A | Discharge status: Home / Self Care | Payer: Medicare Other | Source: Ambulatory Visit | Attending: Cardiovascular Disease | Admitting: Cardiovascular Disease

## 2016-02-20 DIAGNOSIS — Z79899 Other long term (current) drug therapy: Secondary | ICD-10-CM | POA: Diagnosis not present

## 2016-02-20 DIAGNOSIS — R5383 Other fatigue: Secondary | ICD-10-CM | POA: Diagnosis not present

## 2016-02-20 DIAGNOSIS — I10 Essential (primary) hypertension: Secondary | ICD-10-CM | POA: Diagnosis not present

## 2016-02-20 DIAGNOSIS — E785 Hyperlipidemia, unspecified: Secondary | ICD-10-CM | POA: Diagnosis not present

## 2016-02-20 DIAGNOSIS — I340 Nonrheumatic mitral (valve) insufficiency: Secondary | ICD-10-CM | POA: Diagnosis not present

## 2016-02-20 DIAGNOSIS — I4891 Unspecified atrial fibrillation: Secondary | ICD-10-CM | POA: Diagnosis present

## 2016-02-20 DIAGNOSIS — C9111 Chronic lymphocytic leukemia of B-cell type in remission: Secondary | ICD-10-CM | POA: Diagnosis not present

## 2016-02-20 DIAGNOSIS — I361 Nonrheumatic tricuspid (valve) insufficiency: Secondary | ICD-10-CM | POA: Diagnosis not present

## 2016-02-20 DIAGNOSIS — I481 Persistent atrial fibrillation: Secondary | ICD-10-CM | POA: Diagnosis not present

## 2016-02-20 DIAGNOSIS — Z87891 Personal history of nicotine dependence: Secondary | ICD-10-CM | POA: Diagnosis not present

## 2016-02-20 HISTORY — DX: Cardiac arrhythmia, unspecified: I49.9

## 2016-02-20 HISTORY — DX: Gastro-esophageal reflux disease without esophagitis: K21.9

## 2016-02-20 HISTORY — DX: Personal history of other diseases of the digestive system: Z87.19

## 2016-02-20 HISTORY — DX: Unspecified osteoarthritis, unspecified site: M19.90

## 2016-02-20 LAB — CBC WITH DIFFERENTIAL/PLATELET
BASOS PCT: 0 %
Basophils Absolute: 0 10*3/uL (ref 0.0–0.1)
EOS ABS: 0.2 10*3/uL (ref 0.0–0.7)
Eosinophils Relative: 1 %
HEMATOCRIT: 40.4 % (ref 36.0–46.0)
HEMOGLOBIN: 12.8 g/dL (ref 12.0–15.0)
LYMPHS PCT: 75 %
Lymphs Abs: 15.4 10*3/uL — ABNORMAL HIGH (ref 0.7–4.0)
MCH: 30.8 pg (ref 26.0–34.0)
MCHC: 31.7 g/dL (ref 30.0–36.0)
MCV: 97.1 fL (ref 78.0–100.0)
MONOS PCT: 9 %
Monocytes Absolute: 1.8 10*3/uL — ABNORMAL HIGH (ref 0.1–1.0)
Neutro Abs: 3.1 10*3/uL (ref 1.7–7.7)
Neutrophils Relative %: 15 %
Platelets: 90 10*3/uL — ABNORMAL LOW (ref 150–400)
RBC: 4.16 MIL/uL (ref 3.87–5.11)
RDW: 16.6 % — ABNORMAL HIGH (ref 11.5–15.5)
WBC: 20.5 10*3/uL — ABNORMAL HIGH (ref 4.0–10.5)

## 2016-02-20 LAB — BASIC METABOLIC PANEL
ANION GAP: 5 (ref 5–15)
BUN: 10 mg/dL (ref 6–20)
CHLORIDE: 114 mmol/L — AB (ref 101–111)
CO2: 26 mmol/L (ref 22–32)
Calcium: 9.6 mg/dL (ref 8.9–10.3)
Creatinine, Ser: 0.7 mg/dL (ref 0.44–1.00)
GFR calc non Af Amer: >60 mL/min (ref >60)
GLUCOSE: 110 mg/dL — AB (ref 65–99)
POTASSIUM: 3.8 mmol/L (ref 3.5–5.1)
SODIUM: 145 mmol/L (ref 135–145)

## 2016-02-20 MED ORDER — SODIUM CHLORIDE 0.9 % IJ SOLN
INTRAMUSCULAR | Status: AC
  Filled 2016-02-20: qty 10

## 2016-02-21 ENCOUNTER — Ambulatory Visit (HOSPITAL_COMMUNITY): Payer: Medicare Other

## 2016-02-21 ENCOUNTER — Ambulatory Visit (HOSPITAL_COMMUNITY): Payer: Medicare Other | Admitting: Anesthesiology

## 2016-02-21 ENCOUNTER — Other Ambulatory Visit (HOSPITAL_COMMUNITY): Payer: Self-pay | Admitting: *Deleted

## 2016-02-21 ENCOUNTER — Ambulatory Visit (HOSPITAL_COMMUNITY)
Admission: RE | Admit: 2016-02-21 | Discharge: 2016-02-21 | Disposition: A | Discharge status: Home / Self Care | Payer: Medicare Other | Source: Ambulatory Visit | Attending: Cardiovascular Disease | Admitting: Cardiovascular Disease

## 2016-02-21 ENCOUNTER — Encounter (HOSPITAL_COMMUNITY): Payer: Self-pay | Admitting: *Deleted

## 2016-02-21 ENCOUNTER — Encounter (HOSPITAL_COMMUNITY)
Admission: RE | Disposition: A | Discharge status: Home / Self Care | Payer: Self-pay | Source: Ambulatory Visit | Attending: Cardiovascular Disease

## 2016-02-21 ENCOUNTER — Other Ambulatory Visit: Payer: Self-pay

## 2016-02-21 DIAGNOSIS — C9111 Chronic lymphocytic leukemia of B-cell type in remission: Secondary | ICD-10-CM | POA: Insufficient documentation

## 2016-02-21 DIAGNOSIS — I10 Essential (primary) hypertension: Secondary | ICD-10-CM | POA: Diagnosis not present

## 2016-02-21 DIAGNOSIS — Z87891 Personal history of nicotine dependence: Secondary | ICD-10-CM | POA: Insufficient documentation

## 2016-02-21 DIAGNOSIS — E785 Hyperlipidemia, unspecified: Secondary | ICD-10-CM | POA: Diagnosis not present

## 2016-02-21 DIAGNOSIS — R5383 Other fatigue: Secondary | ICD-10-CM | POA: Insufficient documentation

## 2016-02-21 DIAGNOSIS — I481 Persistent atrial fibrillation: Secondary | ICD-10-CM | POA: Insufficient documentation

## 2016-02-21 DIAGNOSIS — Z79899 Other long term (current) drug therapy: Secondary | ICD-10-CM | POA: Insufficient documentation

## 2016-02-21 DIAGNOSIS — I340 Nonrheumatic mitral (valve) insufficiency: Secondary | ICD-10-CM | POA: Diagnosis not present

## 2016-02-21 DIAGNOSIS — I361 Nonrheumatic tricuspid (valve) insufficiency: Secondary | ICD-10-CM | POA: Insufficient documentation

## 2016-02-21 DIAGNOSIS — Z9889 Other specified postprocedural states: Secondary | ICD-10-CM

## 2016-02-21 HISTORY — PX: TEE WITHOUT CARDIOVERSION: SHX5443

## 2016-02-21 SURGERY — ECHOCARDIOGRAM, TRANSESOPHAGEAL
Anesthesia: Monitor Anesthesia Care

## 2016-02-21 MED ORDER — PROPOFOL 500 MG/50ML IV EMUL
INTRAVENOUS | Status: DC | PRN
  Administered 2016-02-21: 50 ug/kg/min via INTRAVENOUS

## 2016-02-21 MED ORDER — PROPOFOL 10 MG/ML IV BOLUS
INTRAVENOUS | Status: AC
Start: 2016-02-21 — End: 2016-02-21
  Filled 2016-02-21: qty 40

## 2016-02-21 MED ORDER — ONDANSETRON HCL 4 MG/2ML IJ SOLN: 4.0000 mg | Freq: Once | INTRAMUSCULAR | Status: DC | PRN

## 2016-02-21 MED ORDER — MIDAZOLAM HCL 2 MG/2ML IJ SOLN
INTRAMUSCULAR | Status: DC | PRN
  Administered 2016-02-21 (×4): 0.5 mg via INTRAVENOUS

## 2016-02-21 MED ORDER — FENTANYL CITRATE (PF) 100 MCG/2ML IJ SOLN
INTRAMUSCULAR | Status: AC
  Filled 2016-02-21: qty 2

## 2016-02-21 MED ORDER — FENTANYL CITRATE (PF) 100 MCG/2ML IJ SOLN
25.0000 ug | INTRAMUSCULAR | Status: AC | PRN
  Administered 2016-02-21 (×2): 25 ug via INTRAVENOUS
  Filled 2016-02-21: qty 2

## 2016-02-21 MED ORDER — LIDOCAINE VISCOUS 2 % MT SOLN
OROMUCOSAL | Status: AC
  Filled 2016-02-21: qty 15

## 2016-02-21 MED ORDER — FENTANYL CITRATE (PF) 100 MCG/2ML IJ SOLN
25.0000 ug | INTRAMUSCULAR | Status: DC | PRN
Start: 2016-02-21 — End: 2016-02-21

## 2016-02-21 MED ORDER — HYDROCORTISONE 1 % EX CREA: 1.0000 "application " | TOPICAL_CREAM | Freq: Three times a day (TID) | CUTANEOUS | Status: AC | PRN

## 2016-02-21 MED ORDER — LACTATED RINGERS IV SOLN
INTRAVENOUS | Status: DC
  Administered 2016-02-21: 10:00:00 via INTRAVENOUS

## 2016-02-21 MED ORDER — MIDAZOLAM HCL 2 MG/2ML IJ SOLN
INTRAMUSCULAR | Status: AC
  Filled 2016-02-21: qty 2

## 2016-02-21 MED ORDER — MIDAZOLAM HCL 2 MG/2ML IJ SOLN
1.0000 mg | INTRAMUSCULAR | Status: DC | PRN
  Administered 2016-02-21: 2 mg via INTRAVENOUS
  Filled 2016-02-21: qty 2

## 2016-02-21 NOTE — Anesthesia Postprocedure Evaluation (Signed)
Anesthesia Post Note  Patient: Daisy Black  Procedure(s) Performed: Procedure(s) (LRB): TRANSESOPHAGEAL ECHOCARDIOGRAM (TEE) WITH PROPOFOL (N/A)  Patient location during evaluation: PACU Anesthesia Type: MAC Level of consciousness: awake Pain management: pain level controlled Vital Signs Assessment: post-procedure vital signs reviewed and stable Respiratory status: spontaneous breathing Cardiovascular status: stable Anesthetic complications: no    Last Vitals:  Vitals:   02/21/16 1018 02/21/16 1030  BP: 125/86 (!) 104/58  Pulse:    Resp: 14 14  Temp: 36.7 C     Last Pain:  Vitals:   02/21/16 0910  TempSrc: Oral                 Altair Stanko

## 2016-02-21 NOTE — H&P (View-Only) (Signed)
Cardiology Office Note  Date: 02/16/2016   ID: [name], [name] 1950/08/18, MRN [id number]  PCP: Renee Rival, NP  Primary Cardiologist: Rozann Lesches, MD   Chief Complaint  Patient presents with  . Atrial Fibrillation    History of Present Illness: Daisy Black is a 65 y.o. female seen recently in the office by Ms. Lawrence NP on June 27. I met her for the first time in consultation in June. She has recently documented persistent atrial fibrillation with prior history of PAF. CHADSVASC score is 3. She has been anticoagulated with Eliquis and treated with heart rate control strategy. She was to undergo TEE guided cardioversion on June 20, noted to have a left atrial appendage thrombus at that time by Dr. Bronson Ing with cardioversion deferred.  She comes in today with her husband to discuss a repeat cardioversion attempt. She reports feeling washed out although heart rate is better controlled. Also complains of nausea on diltiazem. She has been using when necessary Phenergan for this. She has had no chest pain. Denies any bleeding problems. No obvious neurological symptoms.  Past Medical History  Diagnosis Date  . Essential hypertension   . Hyperlipidemia   . Obesity   . Atrial fibrillation ([place])     Atrial fibrillation - onset in 09/2009; DC cardioversion in 10/2009  . Lymphocytosis   . TSH elevation   . CLL (chronic lymphocytic leukemia) ([place])     Past Surgical History  Procedure Laterality Date  . Hernia repair  2011    Incisional and umbilical utilizing mesh  . Cholecystectomy    . Rotator cuff repair    . Ankle surgery Right   . Tonsillectomy    . Tee without cardioversion N/A 01/17/2016    Procedure: TRANSESOPHAGEAL ECHOCARDIOGRAM (TEE);  Surgeon: Herminio Commons, MD;  Location: [facility];  Service: Cardiovascular;  Laterality: N/A;    Current Outpatient Prescriptions  Medication Sig Dispense Refill  . acetaminophen (TYLENOL) 650 MG CR tablet Take  650 mg by mouth every 8 (eight) hours as needed.      Marland Kitchen apixaban (ELIQUIS) 5 MG TABS tablet Take 1 tablet (5 mg total) by mouth 2 (two) times daily. 60 tablet 2  . digoxin (LANOXIN) 0.25 MG tablet Take 1 tablet (0.25 mg total) by mouth daily. 30 tablet 2  . diltiazem (CARDIZEM) 60 MG tablet Take 60 mg by mouth 3 (three) times daily.   2  . famotidine (PEPCID) 20 MG tablet Take 20 mg by mouth 2 (two) times daily.    . Ferrous Fumarate (IRON) 18 MG TBCR Take by mouth daily.    . lansoprazole (PREVACID) 15 MG capsule Take 15 mg by mouth daily at 12 noon.    Marland Kitchen levothyroxine (SYNTHROID, LEVOTHROID) 125 MCG tablet Take 125 mcg by mouth daily before breakfast.    . Magnesium Oxide 200 MG TABS Take 1 tablet (200 mg total) by mouth daily. 30 tablet 6  . metoprolol tartrate (LOPRESSOR) 25 MG tablet Take 1 tablet (25 mg total) by mouth 2 (two) times daily. 60 tablet 2  . Probiotic Product (PROBIOTIC PO) Take 1 capsule by mouth daily.     No current facility-administered medications for this visit.   Allergies:  Review of patient's allergies indicates no known allergies.   Social History: The patient  reports that she quit smoking about 40 years ago. Her smoking use included Cigarettes. She has a .5 pack-year smoking history. She has never used smokeless tobacco. She reports  that she does not drink alcohol or use illicit drugs.   ROS:  Please see the history of present illness. Otherwise, complete review of systems is positive for fatigue.  All other systems are reviewed and negative.   Physical Exam: VS:  BP 92/60 mmHg  Pulse 59  Ht _0  (1.702 m)  Wt 231 lb (104.781 kg)  BMI 36.17 kg/m2  SpO2 98%, BMI Body mass index is 36.17 kg/(m^2).  Wt Readings from Last 3 Encounters:  02/16/16 231 lb (104.781 kg)  01/24/16 230 lb (104.327 kg)  01/15/16 231 lb 11.3 oz (105.1 kg)    General: Overweight woman, appears comfortable at rest. HEENT: Conjunctiva and lids normal, oropharynx clear. Neck: Supple,  no elevated JVP or carotid bruits, no thyromegaly. Lungs: Clear to auscultation, nonlabored breathing at rest. Cardiac: Irregularly irregular, no S3 or significant systolic murmur, no pericardial rub. Abdomen: Soft, nontender, bowel sounds present, no guarding or rebound. Extremities: No pitting edema, distal pulses 2+. Skin: Warm and dry. Musculoskeletal: No kyphosis. Neuropsychiatric: Alert and oriented x3, affect grossly appropriate.  ECG: I personally reviewed the tracing from 01/17/2016 which showed atrial fibrillation at 109 bpm with inferior Q wave.  Recent Labwork: 01/12/2016: Magnesium 2.2; TSH 1.986 01/24/2016: BUN 14; Creatinine, Ser 0.74; Hemoglobin 13.8; Platelets 103*; Potassium 4.2; Sodium 139     Component Value Date/Time   CHOL 219* 11/30/2009 1950   TRIG 69 11/30/2009 1950   HDL 56 11/30/2009 1950   CHOLHDL 3.9 Ratio 11/30/2009 1950   VLDL 14 11/30/2009 1950   LDLCALC 149* 11/30/2009 1950    Other Studies Reviewed Today:  TEE 01/17/2016: Study Conclusions  - Left ventricle: Systolic function was normal. The estimated  ejection fraction was in the range of 55% to 60%. Wall motion was  normal; there were no regional wall motion abnormalities. - Mitral valve: There was trivial regurgitation. - Left atrium: There was a highly suspicious mass in the left  atrial appendage seen in multiple views suggestive of thrombus.  The atrium was moderately dilated. - Tricuspid valve: There was mild regurgitation.  Assessment and Plan:  1. Persistent symptomatic atrial fibrillation as outlined above. Patient continues to complain of fatigue despite adequate heart rate control, low-normal blood pressure as well on medical therapy. She presents to discuss repeat cardioversion attempt after finding of left atrial appendage thrombus by TEE on June 20. She reports compliance with Eliquis, has had no neurological symptoms. She would like to get the procedure scheduled as soon as  possible. Earliest availability is next week, Dr. Bronson Ing is covering the hospital at that time. Although follow-up TEE is not always indicated after 4 weeks of anticoagulation, since she had a definitive left atrial appendage thrombus, we will go ahead and pursue a TEE guided cardioversion once again just to make certain that this has dissipated. She will hold Cardizem dose on the morning of the procedure, can hopefully stop it altogether if she is able to get back in sinus rhythm.  2. Essential hypertension, blood pressure low normal at this time.  3. Hyperlipidemia, managed by diet.  Current medicines were reviewed with the patient today.  Disposition: Follow-up with me after cardioversion.  Signed, Satira Sark, MD, [facility] 02/16/2016 1:34 PM    [facility] [address], [place], [place] [address] Phone: [phone]; Fax: [phone]

## 2016-02-21 NOTE — Anesthesia Procedure Notes (Signed)
Procedure Name: MAC Date/Time: 02/21/2016 9:50 AM Performed by: Vista Deck Pre-anesthesia Checklist: Patient identified, Emergency Drugs available, Suction available, Timeout performed and Patient being monitored Patient Re-evaluated:Patient Re-evaluated prior to inductionOxygen Delivery Method: Non-rebreather mask

## 2016-02-21 NOTE — Interval H&P Note (Signed)
History and Physical Interval Note: No changes. Will proceed with TEE/DCCV as planned.  02/21/2016 8:47 AM  Daisy Black  has presented today for surgery, with the diagnosis of A-FIB  The various methods of treatment have been discussed with the patient and family. After consideration of risks, benefits and other options for treatment, the patient has consented to  Procedure(s): CARDIOVERSION (N/A) TRANSESOPHAGEAL ECHOCARDIOGRAM (TEE) WITH PROPOFOL (N/A) as a surgical intervention .  The patient's history has been reviewed, patient examined, no change in status, stable for surgery.  I have reviewed the patient's chart and labs.  Questions were answered to the patient's satisfaction.     Kate Sable

## 2016-02-21 NOTE — Transfer of Care (Addendum)
Immediate Anesthesia Transfer of Care Note  Patient: Daisy Black  Procedure(s) Performed: Procedure(s): CARDIOVERSION (N/A) TRANSESOPHAGEAL ECHOCARDIOGRAM (TEE) WITH PROPOFOL (N/A)  Patient Location: PACU  Anesthesia Type:MAC  Level of Consciousness: awake and patient cooperative  Airway & Oxygen Therapy: Patient Spontanous Breathing and non-rebreather face mask  Post-op Assessment: Report given to RN and Post -op Vital signs reviewed and stable  Post vital signs: Reviewed and stable  Last Vitals:  Vitals:   02/21/16 0945 02/21/16 0950  BP: (!) 108/56 104/61  Pulse:    Resp: (!) 30 20  Temp:      Last Pain:  Vitals:   02/21/16 0910  TempSrc: Oral      Patients Stated Pain Goal: 5 ([id number])  Complications: No apparent anesthesia complications  Tee only performed . Unable to perform cardioversion. See MD note.

## 2016-02-21 NOTE — Anesthesia Preprocedure Evaluation (Signed)
Anesthesia Evaluation  Patient identified by MRN, date of birth, ID band Patient awake    Reviewed: Allergy & Precautions, NPO status , Patient's Chart, lab work & pertinent test results  Airway Mallampati: II  TM Distance: >3 FB     Dental  (+) Teeth Intact   Pulmonary former smoker,    breath sounds clear to auscultation       Cardiovascular hypertension, Pt. on medications + dysrhythmias Atrial Fibrillation  Rhythm:Regular Rate:Normal     Neuro/Psych    GI/Hepatic negative GI ROS, hiatal hernia, GERD  ,  Endo/Other  Hypothyroidism Morbid obesity  Renal/GU      Musculoskeletal   Abdominal   Peds  Hematology  (+) Blood dyscrasia (CLL in remission), ,   Anesthesia Other Findings   Reproductive/Obstetrics                             Anesthesia Physical Anesthesia Plan  ASA: III  Anesthesia Plan: MAC   Post-op Pain Management:    Induction: Intravenous  Airway Management Planned: Simple Face Mask  Additional Equipment:   Intra-op Plan:   Post-operative Plan:   Informed Consent: I have reviewed the patients History and Physical, chart, labs and discussed the procedure including the risks, benefits and alternatives for the proposed anesthesia with the patient or authorized representative who has indicated his/her understanding and acceptance.     Plan Discussed with:   Anesthesia Plan Comments:         Anesthesia Quick Evaluation

## 2016-02-21 NOTE — Procedures (Signed)
Preliminary TEE report: Normal LV systolic function, EF 0000000. Mild mitral and trivial tricuspid regurgitation. There again appears to be a suspicious mass at the deepest point of the left atrial appendage. It does, however, appear smaller than it had during the previous TEE which I performed. However, pulsed Doppler velocities are also diminished.  While this may represent trabeculated tissue, thrombus cannot entirely be excluded.  Findings communicated with husband and daughter. A cardioversion was not pursued for this reason.

## 2016-02-21 NOTE — Addendum Note (Signed)
Addendum  created 02/21/16 1042 by Vista Deck, CRNA   Sign clinical note

## 2016-02-21 NOTE — Addendum Note (Signed)
Addendum  created 02/21/16 1124 by Vista Deck, CRNA   Anesthesia Staff edited

## 2016-02-21 NOTE — Discharge Instructions (Signed)
Esophagogastroduodenoscopy, Care After Refer to this sheet in the next few weeks. These instructions provide you with information about caring for yourself after your procedure. Your health care provider may also give you more specific instructions. Your treatment has been planned according to current medical practices, but problems sometimes occur. Call your health care provider if you have any problems or questions after your procedure. WHAT TO EXPECT AFTER THE PROCEDURE After your procedure, it is typical to feel:  Soreness in your throat.  Pain with swallowing.  Sick to your stomach (nauseous).  Bloated.  Dizzy.  Fatigued. HOME CARE INSTRUCTIONS  Do not eat or drink anything until the numbing medicine (local anesthetic) has worn off and your gag reflex has returned. You will know that the local anesthetic has worn off when you can swallow comfortably.  Do not drive or operate machinery until directed by your health care provider.  Take medicines only as directed by your health care provider. SEEK MEDICAL CARE IF:   You cannot stop coughing.  You are not urinating at all or less than usual. SEEK IMMEDIATE MEDICAL CARE IF:  You have difficulty swallowing.  You cannot eat or drink.  You have worsening throat or chest pain.  You have dizziness or lightheadedness or you faint.  You have nausea or vomiting.  You have chills.  You have a fever.  You have severe abdominal pain.  You have black, tarry, or bloody stools.   This information is not intended to replace advice given to you by your health care provider. Make sure you discuss any questions you have with your health care provider.   Document Released: 07/02/2012 Document Revised: 08/06/2014 Document Reviewed: 07/02/2012 Elsevier Interactive Patient Education 2016 [employer]   PATIENT INSTRUCTIONS POST-ANESTHESIA  IMMEDIATELY FOLLOWING SURGERY:  Do not drive or operate machinery for the first twenty  four hours after surgery.  Do not make any important decisions for twenty four hours after surgery or while taking narcotic pain medications or sedatives.  If you develop intractable nausea and vomiting or a severe headache please notify your doctor immediately.  FOLLOW-UP:  Please make an appointment with your surgeon as instructed. You do not need to follow up with anesthesia unless specifically instructed to do so.  WOUND CARE INSTRUCTIONS (if applicable):  Keep a dry clean dressing on the anesthesia/puncture wound site if there is drainage.  Once the wound has quit draining you may leave it open to air.  Generally you should leave the bandage intact for twenty four hours unless there is drainage.  If the epidural site drains for more than 36-48 hours please call the anesthesia department.  QUESTIONS?:  Please feel free to call your physician or the hospital operator if you have any questions, and they will be happy to assist you.

## 2016-02-22 ENCOUNTER — Telehealth: Payer: Self-pay | Admitting: Cardiology

## 2016-02-23 ENCOUNTER — Telehealth: Payer: Self-pay

## 2016-02-23 NOTE — Telephone Encounter (Signed)
RE: Daisy Black (atrial fib)  Received: Yesterday  Message Contents  Satira Sark, MD  Bernita Raisin, RN Cc: Herminio Commons, MD        Noted. She is on Cardizem , Lanoxin, and metoprolol for heart rate control of persistent atrial fibrillation. I will review the TEE images myself. If she still has a persistent left atrial appendage thrombus, we obviously cannot plan to cardiovert her as yet. Therefore, we will need to focus on heart rate control for now, although may be able to cut back on at least one of these medications. If she has any sense as to which one is bothering her the most, we might start with that. Otherwise consider changing Cardizem from 3 times a day to twice daily. She will need an office visit to go over this in more detail rather than trying to handle this over the phone.   Previous Messages    ----- Message -----  From: Herminio Commons, MD  Sent: 02/21/2016 11:44 AM  To: Satira Sark, MD, Bernita Raisin, RN  Subject: Daisy Black (atrial fib)            I performed TEE today which showed mass suspicious for thrombus. Explained findings to patient's husband and daughter.  They are concerned that meds are causing patient to feel fatigued.  Please contact patient and family members at home tomorrow to discuss further management options.  Thanks.   Melodye Ped made for 02/28/16 at 1pm with Dr Domenic Polite

## 2016-02-27 ENCOUNTER — Encounter (HOSPITAL_COMMUNITY): Payer: Self-pay | Admitting: Cardiovascular Disease

## 2016-02-27 NOTE — Progress Notes (Signed)
Cardiology Office Note  Date: 02/28/2016   ID: Daisy, Black 1950/11/24, MRN [id number]  PCP: Renee Rival, NP  Primary Cardiologist: Rozann Lesches, MD   Chief Complaint  Patient presents with  . Persistent atrial fibrillation  . Left atrial appendage thrombus    History of Present Illness: Daisy Black is a 66 y.o. female seen recently on July 20 and referred for a TEE guided cardioversion for persistent symptomatic atrial fibrillation. She was found to have a suspected left atrial appendage thrombus when she initially underwent attempted procedure on June 20 and was anticoagulated since that time with Eliquis. Patient presented for repeat attempt and TEE was performed by Dr. Bronson Ing with remaining concerns about residual left atrial appendage thrombus. Cardioversion was not pursued. She presents today to discuss the situation and management options. She is here today with her husband and daughter.  I reviewed both TEE studies from June and July. Although difficult to exclude left atrial appendage thrombus on the initial study, the most recent images suggest that this could be due to prominent trabeculation within the left atrial appendage and possibly acoustic artifact from motion of the free wall. Left atrial appendage emptying velocity is reduced which is a risk factor for development of thrombus. She reports compliance with Eliquis.  We discussed her medications today. She has had good heart rate control, although relatively low blood pressure and feels weak with this. We have discussed taking her Cardizem at 60 mg orally twice daily and otherwise continuing her present medications. She states that she has actually tried this a few days and did feel better.  Today we discussed plans for further management. Since the second TEE study did look better in terms of evaluation of the left atrial appendage, we will proceed as if she needs more time on anticoagulation for  complete resolution of her suspected left atrial appendage thrombus. I will plan to perform a TEE guided cardioversion later this month (4 weeks from the last evaluation). In the past when she had atrial fibrillation, she was able to maintain sinus rhythm for 7 years prior to this most recent event. It makes sense to try to get her back in sinus rhythm before deciding whether we need to pursue other options such as rate control and anticoagulation long-term versus antiarrhythmics or ablation. After discussion of these issues, patient and family are in agreement with plan.  Past Medical History:  Diagnosis Date  . Arthritis   . Atrial fibrillation ([place])    Atrial fibrillation - onset in 09/2009; DC cardioversion in 10/2009  . CLL (chronic lymphocytic leukemia) ([place])   . Dysrhythmia   . Essential hypertension   . GERD (gastroesophageal reflux disease)   . History of hiatal hernia   . Hyperlipidemia   . Lymphocytosis   . Obesity   . TSH elevation     Past Surgical History:  Procedure Laterality Date  . ANKLE SURGERY Right   . CHOLECYSTECTOMY    . HERNIA REPAIR  2011   Incisional and umbilical utilizing mesh  . ROTATOR CUFF REPAIR    . TEE WITHOUT CARDIOVERSION N/A 01/17/2016   Procedure: TRANSESOPHAGEAL ECHOCARDIOGRAM (TEE);  Surgeon: Herminio Commons, MD;  Location: [facility];  Service: Cardiovascular;  Laterality: N/A;  . TEE WITHOUT CARDIOVERSION N/A 02/21/2016   Procedure: TRANSESOPHAGEAL ECHOCARDIOGRAM (TEE) WITH PROPOFOL;  Surgeon: Herminio Commons, MD;  Location: [facility];  Service: Cardiovascular;  Laterality: N/A;  . TONSILLECTOMY  Current Outpatient Prescriptions  Medication Sig Dispense Refill  . acetaminophen (TYLENOL) 650 MG CR tablet Take 650 mg by mouth every 8 (eight) hours as needed.      Marland Kitchen apixaban (ELIQUIS) 5 MG TABS tablet Take 1 tablet (5 mg total) by mouth 2 (two) times daily. 60 tablet 2  . digoxin (LANOXIN) 0.25 MG tablet Take 1 tablet (0.25 mg total) by  mouth daily. 30 tablet 2  . famotidine (PEPCID) 20 MG tablet Take 20 mg by mouth 2 (two) times daily.    . Ferrous Fumarate (IRON) 18 MG TBCR Take by mouth daily.    . lansoprazole (PREVACID) 15 MG capsule Take 15 mg by mouth daily at 12 noon.    Marland Kitchen levothyroxine (SYNTHROID, LEVOTHROID) 125 MCG tablet Take 125 mcg by mouth daily before breakfast.    . Magnesium Oxide 200 MG TABS Take 1 tablet (200 mg total) by mouth daily. 30 tablet 6  . metoprolol tartrate (LOPRESSOR) 25 MG tablet Take 1 tablet (25 mg total) by mouth 2 (two) times daily. 60 tablet 2  . Probiotic Product (PROBIOTIC PO) Take 1 capsule by mouth daily.    . promethazine (PHENERGAN) 12.5 MG tablet Take 1 tablet (12.5 mg total) by mouth every 6 (six) hours as needed for nausea or vomiting. 30 tablet 0  . diltiazem (CARDIZEM) 60 MG tablet Take 1 tablet (60 mg total) by mouth 2 (two) times daily. 60 tablet 6   No current facility-administered medications for this visit.    Facility-Administered Medications Ordered in Other Visits  Medication Dose Route Frequency Provider Last Rate Last Dose  . hydrocortisone cream 1 % 1 application  1 application Topical TID PRN Satira Sark, MD       Allergies:  Review of patient's allergies indicates no known allergies.   Social History: The patient  reports that she quit smoking about 40 years ago. Her smoking use included Cigarettes. She has a 0.50 pack-year smoking history. She has never used smokeless tobacco. She reports that she does not drink alcohol or use drugs.   ROS:  Please see the history of present illness. Otherwise, complete review of systems is positive for fatigue.  All other systems are reviewed and negative.   Physical Exam: VS:  BP (!) 104/58   Pulse 61   Ht 5\' 7"  (1.702 m)   Wt 224 lb (101.6 kg)   SpO2 96%   BMI 35.08 kg/m , BMI Body mass index is 35.08 kg/m.  Wt Readings from Last 3 Encounters:  02/28/16 224 lb (101.6 kg)  02/20/16 236 lb (107 kg)  02/16/16  231 lb (104.8 kg)    General: Overweight woman, appears comfortable at rest. HEENT: Conjunctiva and lids normal, oropharynx clear. Neck: Supple, no elevated JVP or carotid bruits, no thyromegaly. Lungs: Clear to auscultation, nonlabored breathing at rest. Cardiac: Irregularly irregular, no S3 or significant systolic murmur, no pericardial rub. Abdomen: Soft, nontender, bowel sounds present, no guarding or rebound. Extremities: No pitting edema, distal pulses 2+. Skin: Warm and dry. Musculoskeletal: No kyphosis. Neuropsychiatric: Alert and oriented x3, affect grossly appropriate.  ECG: I personally reviewed the tracing from 02/21/2016 which showed rate-controlled atrial fibrillation at 75 bpm with nonspecific ST changes.  Recent Labwork: 01/12/2016: Magnesium 2.2; TSH 1.986 02/20/2016: BUN 10; Creatinine, Ser 0.70; Hemoglobin 12.8; Platelets 90; Potassium 3.8; Sodium 145   Other Studies Reviewed Today:  Chest x-ray 01/12/2016: FINDINGS: Emphysematous changes and scattered fibrosis in the lungs. Normal heart size and pulmonary vascularity.  No focal airspace disease or consolidation in the lungs. No blunting of costophrenic angles. No pneumothorax. Mediastinal contours appear intact. Degenerative changes in the spine.  IMPRESSION: No active cardiopulmonary disease.  Assessment and Plan:  1. Persistent atrial fibrillation as outlined. Plan is to continue current regimen except to reduce diltiazem to 60 mg twice daily for now. She will stay on Eliquis and we will plan a TEE guided cardioversion (would reassess the left atrial appendage prior to pursuing cardioversion) later in August.  2. Essential hypertension, blood pressure has been running low on medical therapy aimed at heart rate control of her atrial fibrillation. We are reducing diltiazem dosing.  3. Hyperlipidemia, managed by diet.  Current medicines were reviewed with the patient today.  Disposition: TEE guided  cardioversion with me in late August as discussed.  Signed, Satira Sark, MD, [facility] 02/28/2016 1:45 PM    [facility] at [facility] [address], [place], [place] [address] Phone: [phone]; Fax: [phone]

## 2016-02-28 ENCOUNTER — Ambulatory Visit (INDEPENDENT_AMBULATORY_CARE_PROVIDER_SITE_OTHER): Payer: Medicare Other | Admitting: Cardiology

## 2016-02-28 ENCOUNTER — Encounter: Payer: Self-pay | Admitting: Cardiology

## 2016-02-28 VITALS — BP 104/58 | HR 61 | Ht 67.0 in | Wt 224.0 lb

## 2016-02-28 DIAGNOSIS — I5189 Other ill-defined heart diseases: Secondary | ICD-10-CM

## 2016-02-28 DIAGNOSIS — I4819 Other persistent atrial fibrillation: Secondary | ICD-10-CM

## 2016-02-28 DIAGNOSIS — I513 Intracardiac thrombosis, not elsewhere classified: Secondary | ICD-10-CM

## 2016-02-28 DIAGNOSIS — I10 Essential (primary) hypertension: Secondary | ICD-10-CM | POA: Diagnosis not present

## 2016-02-28 DIAGNOSIS — E785 Hyperlipidemia, unspecified: Secondary | ICD-10-CM | POA: Diagnosis not present

## 2016-02-28 DIAGNOSIS — I481 Persistent atrial fibrillation: Secondary | ICD-10-CM | POA: Diagnosis not present

## 2016-02-28 MED ORDER — DILTIAZEM HCL 60 MG PO TABS: 60.0000 mg | ORAL_TABLET | Freq: Two times a day (BID) | ORAL | 6 refills | Status: DC

## 2016-02-28 NOTE — Patient Instructions (Signed)
Your physician recommends that you schedule a follow-up appointment in: after cardioversion  Your physician has requested that you have a TEE/Cardioversion. During a TEE, sound waves are used to create images of your heart. It provides your doctor with information about the size and shape of your heart and how well your heart's chambers and valves are working. In this test, a transducer is attached to the end of a flexible tube that is guided down you throat and into your esophagus (the tube leading from your mouth to your stomach) to get a more detailed image of your heart. Once the TEE has determined that a blood clot is not present, the cardioversion begins. Electrical Cardioversion uses a jolt of electricity to your heart either through paddles or wired patches attached to your chest. This is a controlled, usually prescheduled, procedure. This procedure is done at the hospital and you are not awake during the procedure. You usually go home the day of the procedure. Please see the instruction sheet given to you today for more information.     DECREASE Diltiazem  To twice a day      Thank you for choosing [facility] !

## 2016-03-08 ENCOUNTER — Emergency Department (HOSPITAL_COMMUNITY)
Admission: EM | Admit: 2016-03-08 | Discharge: 2016-03-08 | Disposition: A | Discharge status: Home / Self Care | Payer: Medicare Other | Attending: Emergency Medicine | Admitting: Emergency Medicine

## 2016-03-08 ENCOUNTER — Encounter (HOSPITAL_COMMUNITY): Payer: Self-pay | Admitting: Emergency Medicine

## 2016-03-08 ENCOUNTER — Emergency Department (HOSPITAL_COMMUNITY): Payer: Medicare Other

## 2016-03-08 DIAGNOSIS — R1012 Left upper quadrant pain: Secondary | ICD-10-CM | POA: Diagnosis present

## 2016-03-08 DIAGNOSIS — K219 Gastro-esophageal reflux disease without esophagitis: Secondary | ICD-10-CM | POA: Diagnosis not present

## 2016-03-08 DIAGNOSIS — Z87891 Personal history of nicotine dependence: Secondary | ICD-10-CM | POA: Diagnosis not present

## 2016-03-08 DIAGNOSIS — I10 Essential (primary) hypertension: Secondary | ICD-10-CM | POA: Insufficient documentation

## 2016-03-08 DIAGNOSIS — D72829 Elevated white blood cell count, unspecified: Secondary | ICD-10-CM | POA: Insufficient documentation

## 2016-03-08 DIAGNOSIS — R161 Splenomegaly, not elsewhere classified: Secondary | ICD-10-CM | POA: Diagnosis not present

## 2016-03-08 LAB — COMPREHENSIVE METABOLIC PANEL
ALT: 13 U/L — ABNORMAL LOW (ref 14–54)
ANION GAP: 5 (ref 5–15)
AST: 21 U/L (ref 15–41)
Albumin: 4 g/dL (ref 3.5–5.0)
Alkaline Phosphatase: 88 U/L (ref 38–126)
BILIRUBIN TOTAL: 0.8 mg/dL (ref 0.3–1.2)
BUN: 12 mg/dL (ref 6–20)
CHLORIDE: 110 mmol/L (ref 101–111)
CO2: 25 mmol/L (ref 22–32)
Calcium: 9.1 mg/dL (ref 8.9–10.3)
Creatinine, Ser: 0.9 mg/dL (ref 0.44–1.00)
GFR calc Af Amer: >60 mL/min (ref >60)
Glucose, Bld: 107 mg/dL — ABNORMAL HIGH (ref 65–99)
POTASSIUM: 3.3 mmol/L — AB (ref 3.5–5.1)
Sodium: 140 mmol/L (ref 135–145)
TOTAL PROTEIN: 6.4 g/dL — AB (ref 6.5–8.1)

## 2016-03-08 LAB — URINALYSIS, ROUTINE W REFLEX MICROSCOPIC
Glucose, UA: NEGATIVE mg/dL
HGB URINE DIPSTICK: NEGATIVE
Leukocytes, UA: NEGATIVE
NITRITE: NEGATIVE
PROTEIN: NEGATIVE mg/dL
SPECIFIC GRAVITY, URINE: 1.02 (ref 1.005–1.030)
pH: 5.5 (ref 5.0–8.0)

## 2016-03-08 LAB — CBC WITH DIFFERENTIAL/PLATELET
BASOS ABS: 0 10*3/uL (ref 0.0–0.1)
BASOS PCT: 0 %
Eosinophils Absolute: 0.2 10*3/uL (ref 0.0–0.7)
Eosinophils Relative: 1 %
HCT: 42.3 % (ref 36.0–46.0)
HEMOGLOBIN: 13.6 g/dL (ref 12.0–15.0)
LYMPHS PCT: 85 %
Lymphs Abs: 18.9 10*3/uL — ABNORMAL HIGH (ref 0.7–4.0)
MCH: 30.8 pg (ref 26.0–34.0)
MCHC: 32.2 g/dL (ref 30.0–36.0)
MCV: 95.7 fL (ref 78.0–100.0)
MONO ABS: 0.5 10*3/uL (ref 0.1–1.0)
Monocytes Relative: 2 %
NEUTROS ABS: 2.6 10*3/uL (ref 1.7–7.7)
NEUTROS PCT: 12 %
PLATELETS: 109 10*3/uL — AB (ref 150–400)
RBC: 4.42 MIL/uL (ref 3.87–5.11)
RDW: 15.7 % — ABNORMAL HIGH (ref 11.5–15.5)
WBC: 22.1 10*3/uL — AB (ref 4.0–10.5)

## 2016-03-08 LAB — LIPASE, BLOOD: LIPASE: 33 U/L (ref 11–51)

## 2016-03-08 MED ORDER — IOPAMIDOL (ISOVUE-300) INJECTION 61%
100.0000 mL | Freq: Once | INTRAVENOUS | Status: AC | PRN
Start: 2016-03-08 — End: 2016-03-08
  Administered 2016-03-08: 100 mL via INTRAVENOUS

## 2016-03-08 MED ORDER — ONDANSETRON HCL 4 MG/2ML IJ SOLN
4.0000 mg | Freq: Once | INTRAMUSCULAR | Status: AC
  Administered 2016-03-08: 4 mg via INTRAVENOUS
  Filled 2016-03-08: qty 2

## 2016-03-08 MED ORDER — DIATRIZOATE MEGLUMINE & SODIUM 66-10 % PO SOLN
ORAL | Status: AC
  Administered 2016-03-08: 30 mL
  Filled 2016-03-08: qty 30

## 2016-03-08 NOTE — ED Provider Notes (Signed)
[facility] DEPT Provider Note   CSN: [id number] Arrival date & time: 03/08/16  1418  First Provider Contact:  First MD Initiated Contact with Patient 03/08/16 1455        History   Chief Complaint Chief Complaint  Patient presents with  . Abdominal Pain    HPI TALAIYA PHAM is a 65 y.o. female.  HPI Patient presents with abdominal pain for the past few months. States his been worsening over the last few weeks. Mostly located in left upper quadrant but describes it as generalized at times. Associated with nausea this worsened after eating. She also has burning sensation that radiates up to the chest. She attributes this to her reflux. Denies any fever or chills. Denies diarrhea or constipation. No melena or gross blood in stool. Patient was seen by urgent care facility had plain x-rays performed yesterday. Was told she had an enlarged spleen and he did have a CT scan performed. She has an appointment to follow-up with Dr. Oneida Alar, gastroenterology tomorrow. Past Medical History:  Diagnosis Date  . Arthritis   . Atrial fibrillation ([place])    Atrial fibrillation - onset in 09/2009; DC cardioversion in 10/2009  . CLL (chronic lymphocytic leukemia) ([place])   . Dysrhythmia   . Essential hypertension   . GERD (gastroesophageal reflux disease)   . History of hiatal hernia   . Hyperlipidemia   . Lymphocytosis   . Obesity   . TSH elevation     Patient Active Problem List   Diagnosis Date Noted  . CLL (chronic lymphocytic leukemia) ([place]) 01/26/2016  . Chest pain 01/12/2016  . Atrial fibrillation with RVR ([place]) 01/12/2016  . Leukocytosis 01/12/2016  . Atrial fibrillation ([place])   . Anticoagulant long-term use 10/04/2010  . Family hx-arthritis 10/04/2010  . TSH elevation 10/04/2010  . KNEE, ARTHRITIS, DEGEN./OSTEO 01/25/2010  . Hyperlipidemia 11/09/2009  . OBESITY 11/09/2009  . LYMPHOCYTOSIS 11/01/2009  . Essential hypertension 11/01/2009    Past Surgical History:    Procedure Laterality Date  . ANKLE SURGERY Right   . CHOLECYSTECTOMY    . HERNIA REPAIR  2011   Incisional and umbilical utilizing mesh  . ROTATOR CUFF REPAIR    . TEE WITHOUT CARDIOVERSION N/A 01/17/2016   Procedure: TRANSESOPHAGEAL ECHOCARDIOGRAM (TEE);  Surgeon: Herminio Commons, MD;  Location: [facility];  Service: Cardiovascular;  Laterality: N/A;  . TEE WITHOUT CARDIOVERSION N/A 02/21/2016   Procedure: TRANSESOPHAGEAL ECHOCARDIOGRAM (TEE) WITH PROPOFOL;  Surgeon: Herminio Commons, MD;  Location: [facility];  Service: Cardiovascular;  Laterality: N/A;  . TONSILLECTOMY      OB History    No data available       Home Medications    Prior to Admission medications   Medication Sig Start Date End Date Taking? Authorizing Provider  acetaminophen (TYLENOL) 650 MG CR tablet Take 650 mg by mouth every 8 (eight) hours as needed for pain.    Yes Historical Provider, MD  apixaban (ELIQUIS) 5 MG TABS tablet Take 1 tablet (5 mg total) by mouth 2 (two) times daily. 01/17/16  Yes Erline Hau, MD  digoxin (LANOXIN) 0.25 MG tablet Take 1 tablet (0.25 mg total) by mouth daily. 01/17/16  Yes Estela Leonie Green, MD  diltiazem (CARDIZEM) 60 MG tablet Take 1 tablet (60 mg total) by mouth 2 (two) times daily. 02/28/16  Yes Satira Sark, MD  famotidine (PEPCID) 20 MG tablet Take 20 mg by mouth 2 (two) times daily.   Yes Historical Provider, MD  Ferrous Fumarate (IRON) 18 MG TBCR Take 1 tablet by mouth daily.    Yes Historical Provider, MD  lansoprazole (PREVACID) 15 MG capsule Take 15 mg by mouth daily at 12 noon.   Yes Historical Provider, MD  levothyroxine (SYNTHROID, LEVOTHROID) 125 MCG tablet Take 125 mcg by mouth daily before breakfast.   Yes Historical Provider, MD  Magnesium Oxide 200 MG TABS Take 1 tablet (200 mg total) by mouth daily. 01/24/16  Yes Lendon Colonel, NP  metoprolol tartrate (LOPRESSOR) 25 MG tablet Take 1 tablet (25 mg total) by mouth 2 (two) times daily.  01/17/16  Yes Erline Hau, MD  Probiotic Product (PROBIOTIC PO) Take 1 capsule by mouth daily.   Yes Historical Provider, MD  promethazine (PHENERGAN) 12.5 MG tablet Take 1 tablet (12.5 mg total) by mouth every 6 (six) hours as needed for nausea or vomiting. 02/16/16  Yes Satira Sark, MD    Family History Family History  Problem Relation Age of Onset  . Ovarian cancer Mother 26    Secondary to ovarian cancer  . Leukemia Mother   . Stroke Father 25    Brain stem infarction  . Breast cancer Paternal Aunt     Social History Social History  Substance Use Topics  . Smoking status: Former Smoker    Packs/day: 0.25    Years: 2.00    Types: Cigarettes    Quit date: 08/03/1975  . Smokeless tobacco: Never Used  . Alcohol use No     Allergies   Review of patient's allergies indicates no known allergies.   Review of Systems Review of Systems  Constitutional: Positive for appetite change. Negative for chills, fatigue and fever.  Respiratory: Negative for shortness of breath.   Cardiovascular: Negative for chest pain.  Gastrointestinal: Positive for abdominal distention, abdominal pain and nausea. Negative for blood in stool, constipation, diarrhea and vomiting.  Genitourinary: Negative for dysuria, flank pain, frequency and hematuria.  Musculoskeletal: Negative for back pain, neck pain and neck stiffness.  Skin: Negative for rash and wound.  Neurological: Negative for dizziness, weakness, light-headedness, numbness and headaches.  All other systems reviewed and are negative.    Physical Exam Updated Vital Signs BP 111/70 (BP Location: Left Arm)   Pulse 78   Temp 98.7 F (37.1 C) (Oral)   Resp 16   Ht 5\' 7"  (1.702 m)   Wt 223 lb (101.2 kg)   SpO2 97%   BMI 34.93 kg/m   Physical Exam  Constitutional: She is oriented to person, place, and time. She appears well-developed and well-nourished. No distress.  HENT:  Head: Normocephalic and atraumatic.    Mouth/Throat: Oropharynx is clear and moist.  Eyes: EOM are normal. Pupils are equal, round, and reactive to light.  Neck: Normal range of motion. Neck supple.  Cardiovascular: Normal rate and regular rhythm.   Pulmonary/Chest: Effort normal and breath sounds normal.  Abdominal: Soft. Bowel sounds are normal. She exhibits distension (mild diffuse distention.). She exhibits no mass (no appreciated masses). There is tenderness (left upper quadrant tenderness with palpation. No guarding.). There is no rebound and no guarding.  Musculoskeletal: Normal range of motion. She exhibits no edema or tenderness.  No CVA tenderness bilaterally.  Neurological: She is alert and oriented to person, place, and time.  Moves all extremities without deficit. Sensation is fully intact.  Skin: Skin is warm and dry. No rash noted. No erythema.  Psychiatric: She has a normal mood and affect. Her behavior is normal.  Nursing note and vitals reviewed.    ED Treatments / Results  Labs (all labs ordered are listed, but only abnormal results are displayed) Labs Reviewed  COMPREHENSIVE METABOLIC PANEL - Abnormal; Notable for the following:       Result Value   Potassium 3.3 (*)    Glucose, Bld 107 (*)    Total Protein 6.4 (*)    ALT 13 (*)    All other components within normal limits  CBC WITH DIFFERENTIAL/PLATELET - Abnormal; Notable for the following:    WBC 22.1 (*)    RDW 15.7 (*)    Platelets 109 (*)    Lymphs Abs 18.9 (*)    All other components within normal limits  URINALYSIS, ROUTINE W REFLEX MICROSCOPIC (NOT AT [facility]) - Abnormal; Notable for the following:    Bilirubin Urine SMALL (*)    Ketones, ur TRACE (*)    All other components within normal limits  LIPASE, BLOOD  PATHOLOGIST SMEAR REVIEW    EKG  EKG Interpretation None       Radiology Ct Abdomen Pelvis W Contrast  Result Date: 03/08/2016 CLINICAL DATA:  All over abd pain x 2 months/ hx GERD, hiatal hernia, HTN, CLL/ hernia  repair and cholecystectomy EXAM: CT ABDOMEN AND PELVIS WITH CONTRAST TECHNIQUE: Multidetector CT imaging of the abdomen and pelvis was performed using the standard protocol following bolus administration of intravenous contrast. CONTRAST:  140mL ISOVUE-300 IOPAMIDOL (ISOVUE-300) INJECTION 61% COMPARISON:  04/09/2014 and 10/16/2013. FINDINGS: All lung bases: Heart top-normal in size. Lung bases essentially clear. Several cardiophrenic angle lymph nodes, largest on the right measuring 11 mm in short axis. Hepatobiliary: Normal liver. Gallbladder surgically absent. No bile duct dilation. Spleen: Spleen is significantly enlarged. Spleen measures 22 x 7.6 x 17.2 cm. No splenic mass or focal lesion. Pancreas:  Unremarkable. Adrenal glands: No masses. Kidneys, ureters, bladder: 3-4 mm low-density lesion arising from the cortex of the left kidney at the midpole most likely a cyst. No other renal masses or lesions. No stones. No hydronephrosis. Normal ureters. Bladder is unremarkable. Uterus and adnexa:  Normal. Lymph nodes: There are numerous enlarged upper abdominal lymph nodes. Several reference measurements were made. There is a posterior gastrohepatic ligament chain node measuring 2.5 x 3.3 cm. A left very celiac node measures 15 mm in short axis. A node posterior to the third portion of the duodenum measures 16 mm in short axis. An aortocaval node at the midpole level of the right kidney measures 14 mm in short axis. There is a right common iliac chain node measuring 12 mm in short axis. External iliac chain adenopathy is noted, largest on the right measuring 14 mm in short axis. There are prominent inguinal lymph nodes. One of the larger nodes on the left measures 12 mm in short axis. Ascites:  None. Vascular:  Unremarkable. Gastrointestinal: Stomach and small bowel are unremarkable. There are scattered colonic diverticula. No diverticulitis. Colon otherwise unremarkable. Normal appendix visualized. Musculoskeletal:  Degenerative disc changes of the lower thoracic and lower lumbar spine. No osteoblastic or osteolytic lesions. IMPRESSION: 1. No acute findings within the abdomen pelvis. 2. Significant splenomegaly as well as multiple areas of adenopathy. 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 Tissue sampling is warranted. Electronically Signed   By: Lajean Manes M.D.   On: 03/08/2016 16:27    Procedures Procedures (including critical care time)  Medications Ordered in ED Medications  diatrizoate meglumine-sodium (GASTROGRAFIN) 66-10 % solution (30 mLs  Given 03/08/16 1517)  ondansetron (  ZOFRAN) injection 4 mg (4 mg Intravenous Given 03/08/16 1526)  iopamidol (ISOVUE-300) 61 % injection 100 mL (100 mLs Intravenous Contrast Given 03/08/16 1600)     Initial Impression / Assessment and Plan / ED Course  I have reviewed the triage vital signs and the nursing notes.  Pertinent labs & imaging results that were available during my care of the patient were reviewed by me and considered in my medical decision making (see chart for details).  Clinical Course  Value Comment By Time  AST: 21 (Reviewed) Julianne Rice, MD 08/10 1720    Discussed results of CT and laboratory findings with the patient and her daughter. She has appointment to follow-up with her gastroenterologist tomorrow. Advised Also Follow-Up with Her Oncologist. Both agree with plan and have voiced understanding. Return precautions given.  Final Clinical Impressions(s) / ED Diagnoses   Final diagnoses:  Splenomegaly  Leukocytosis  Gastroesophageal reflux disease without esophagitis    New Prescriptions New Prescriptions   No medications on file     Julianne Rice, MD 03/08/16 1738

## 2016-03-08 NOTE — ED Triage Notes (Signed)
Abdominal pain for couple months, rates pain 3/10.  Was seen in [place] at [facility] for abdominal pain, told to dome to ED for CT of abdomen.

## 2016-03-09 ENCOUNTER — Encounter: Payer: Self-pay | Admitting: Gastroenterology

## 2016-03-09 ENCOUNTER — Ambulatory Visit (INDEPENDENT_AMBULATORY_CARE_PROVIDER_SITE_OTHER): Payer: Medicare Other | Admitting: Gastroenterology

## 2016-03-09 ENCOUNTER — Ambulatory Visit: Payer: Medicare Other | Admitting: Gastroenterology

## 2016-03-09 DIAGNOSIS — R1010 Upper abdominal pain, unspecified: Secondary | ICD-10-CM | POA: Diagnosis not present

## 2016-03-09 DIAGNOSIS — K219 Gastro-esophageal reflux disease without esophagitis: Secondary | ICD-10-CM | POA: Diagnosis not present

## 2016-03-09 DIAGNOSIS — R112 Nausea with vomiting, unspecified: Secondary | ICD-10-CM

## 2016-03-09 DIAGNOSIS — IMO0001 Reserved for inherently not codable concepts without codable children: Secondary | ICD-10-CM | POA: Insufficient documentation

## 2016-03-09 DIAGNOSIS — R161 Splenomegaly, not elsewhere classified: Secondary | ICD-10-CM | POA: Diagnosis not present

## 2016-03-09 DIAGNOSIS — R1013 Epigastric pain: Secondary | ICD-10-CM | POA: Insufficient documentation

## 2016-03-09 DIAGNOSIS — R1110 Vomiting, unspecified: Secondary | ICD-10-CM

## 2016-03-09 LAB — PATHOLOGIST SMEAR REVIEW

## 2016-03-09 MED ORDER — PANTOPRAZOLE SODIUM 40 MG PO TBEC: 40.0000 mg | DELAYED_RELEASE_TABLET | Freq: Every day | ORAL | 5 refills | Status: DC

## 2016-03-09 NOTE — Progress Notes (Addendum)
Primary Care Physician:  Renee Rival, NP  Primary Gastroenterologist:  Garfield Cornea, MD   Chief Complaint  Patient presents with  . Gastroesophageal Reflux    worsened last couple months  . Abdominal Pain    HPI:  Daisy Black is a 65 y.o. female here for further evaluation of worsening GERD, abdominal pain, nausea at the request of Jarome Matin, PA-C at [facility] in [place], [place]. Patient has a history of CLL followed by Dr. Grayland Ormond in [place]. She also has a history of iron deficiency anemia and has required one iron infusion this year. Her CLL has not required any sort of intervention to this point. She last saw Dr. Grayland Ormond in May 2017. He noted prior CT scan in August 2016 with slight interval increase the size of several lymph nodes in the axilla, mediastinum, abdomen compared to prior study in 2015 but did not feel like any intervention was necessary at the time.  Patient presents today complaining of significant, frequent regurgitation, nausea, lump in throat. Some heartburn. Symptoms worse over the last few months. Complains of early satiety, feeling full with meals. Nausea worse postprandially. Denies dysphagia. Reports she has lost a few pounds. She reports having constant mild abdominal pain but worse at times. Mostly in the upper abdomen, more on the left upper quadrant. Reports remote EGD in the 1990s, history of hiatal hernia. She's had some recent diarrhea that this is improved. No melena rectal bleeding. Plans for cardioversion in 2 weeks for A. fib. Denies previous colonoscopy.   Patient had recent CT scan of the abdomen and pelvis, compared to prior CTs in 2015 she has significantly enlarged spleen now measuring 22 x 7.6 x 17.2 cm. Numerous to enlarged upper abdominal lymph nodes largest up to 3.3 cm. Stomach appeared unremarkable. Radiologist was concern for non-Hodgkin's lymphoma based on CT findings.  Current Outpatient Prescriptions  Medication Sig Dispense  Refill  . acetaminophen (TYLENOL) 650 MG CR tablet Take 650 mg by mouth every 8 (eight) hours as needed for pain.     Marland Kitchen apixaban (ELIQUIS) 5 MG TABS tablet Take 1 tablet (5 mg total) by mouth 2 (two) times daily. 60 tablet 2  . digoxin (LANOXIN) 0.25 MG tablet Take 1 tablet (0.25 mg total) by mouth daily. 30 tablet 2  . diltiazem (CARDIZEM) 60 MG tablet Take 1 tablet (60 mg total) by mouth 2 (two) times daily. 60 tablet 6  . famotidine (PEPCID) 20 MG tablet Take 20 mg by mouth daily.     . Ferrous Fumarate (IRON) 18 MG TBCR Take 1 tablet by mouth daily.     . lansoprazole (PREVACID) 15 MG capsule Take 15 mg by mouth daily at 12 noon.    Marland Kitchen levothyroxine (SYNTHROID, LEVOTHROID) 125 MCG tablet Take 125 mcg by mouth daily before breakfast.    . Magnesium Oxide 200 MG TABS Take 1 tablet (200 mg total) by mouth daily. 30 tablet 6  . metoprolol tartrate (LOPRESSOR) 25 MG tablet Take 1 tablet (25 mg total) by mouth 2 (two) times daily. 60 tablet 2  . Probiotic Product (PROBIOTIC PO) Take 1 capsule by mouth daily.    . promethazine (PHENERGAN) 12.5 MG tablet Take 1 tablet (12.5 mg total) by mouth every 6 (six) hours as needed for nausea or vomiting. 30 tablet 0   No current facility-administered medications for this visit.    Facility-Administered Medications Ordered in Other Visits  Medication Dose Route Frequency Provider Last Rate Last Dose  .  hydrocortisone cream 1 % 1 application  1 application Topical TID PRN Satira Sark, MD        Allergies as of 03/09/2016  . (No Known Allergies)    Past Medical History:  Diagnosis Date  . Arthritis   . Atrial fibrillation ([place])    Atrial fibrillation - onset in 09/2009; DC cardioversion in 10/2009  . CLL (chronic lymphocytic leukemia) ([place])   . Dysrhythmia   . Essential hypertension   . GERD (gastroesophageal reflux disease)   . History of hiatal hernia   . Hyperlipidemia   . Lymphocytosis   . Obesity   . TSH elevation     Past Surgical  History:  Procedure Laterality Date  . ANKLE SURGERY Right   . CHOLECYSTECTOMY    . HERNIA REPAIR  2011   Incisional and umbilical utilizing mesh  . ROTATOR CUFF REPAIR    . TEE WITHOUT CARDIOVERSION N/A 01/17/2016   Procedure: TRANSESOPHAGEAL ECHOCARDIOGRAM (TEE);  Surgeon: Herminio Commons, MD;  Location: [facility];  Service: Cardiovascular;  Laterality: N/A;  . TEE WITHOUT CARDIOVERSION N/A 02/21/2016   Procedure: TRANSESOPHAGEAL ECHOCARDIOGRAM (TEE) WITH PROPOFOL;  Surgeon: Herminio Commons, MD;  Location: [facility];  Service: Cardiovascular;  Laterality: N/A;  . TONSILLECTOMY      Family History  Problem Relation Age of Onset  . Ovarian cancer Mother 66    Secondary to ovarian cancer  . Leukemia Mother   . Stroke Father 68    Brain stem infarction  . Breast cancer Paternal Aunt     Social History   Social History  . Marital status: Married    Spouse name: N/A  . Number of children: 47  . Years of education: N/A   Occupational History  . Not on file.   Social History Main Topics  . Smoking status: Former Smoker    Packs/day: 0.25    Years: 2.00    Types: Cigarettes    Quit date: 08/03/1975  . Smokeless tobacco: Never Used  . Alcohol use No  . Drug use: No  . Sexual activity: Not on file   Other Topics Concern  . Not on file   Social History Narrative  . No narrative on file      ROS:  General: Negative for  fever, chills. See hpi. Eyes: Negative for vision changes.  ENT: Negative for hoarseness, difficulty swallowing , nasal congestion. CV: Negative for chest pain, angina, palpitations, dyspnea on exertion, peripheral edema.  Respiratory: Negative for dyspnea at rest, dyspnea on exertion, cough, sputum, wheezing.  GI: See history of present illness. GU:  Negative for dysuria, hematuria, urinary incontinence, urinary frequency, nocturnal urination.  MS: Negative for joint pain, low back pain.  Derm: Negative for rash or itching.  Neuro: Negative for  weakness, abnormal sensation, seizure, frequent headaches, memory loss, confusion.  Psych: Negative for anxiety, depression, suicidal ideation, hallucinations.  Endo: Negative for unusual weight change.  Heme: Negative for bruising or bleeding. Allergy: Negative for rash or hives.    Physical Examination:  BP 99/61   Pulse 60   Temp 97.6 F (36.4 C) (Oral)   Ht 5' 7.5" (1.715 m)   Wt 224 lb 6.4 oz (101.8 kg)   BMI 34.63 kg/m    General: Well-nourished, well-developed in no acute distress. Accompanied by spouse. Head: Normocephalic, atraumatic.   Eyes: Conjunctiva pink, no icterus. Mouth: Oropharyngeal mucosa moist and pink , no lesions erythema or exudate. Neck: Supple without thyromegaly, masses, or lymphadenopathy.  Lungs: Clear to auscultation bilaterally.  Heart: Regular rate and rhythm, no murmurs rubs or gallops.  Abdomen: Bowel sounds are normal, tender in left upper abdomen., nondistended, positive splenomegaly. No other masses, no abdominal bruits or    hernia , no rebound or guarding.   Rectal: Not performed Extremities: No lower extremity edema. No clubbing or deformities.  Neuro: Alert and oriented x 4 , grossly normal neurologically.  Skin: Warm and dry, no rash or jaundice.   Psych: Alert and cooperative, normal mood and affect.  Labs: Lab Results  Component Value Date   LIPASE 33 03/08/2016   Lab Results  Component Value Date   CREATININE 0.90 03/08/2016   BUN 12 03/08/2016   NA 140 03/08/2016   K 3.3 (L) 03/08/2016   CL 110 03/08/2016   CO2 25 03/08/2016   Lab Results  Component Value Date   ALT 13 (L) 03/08/2016   AST 21 03/08/2016   ALKPHOS 88 03/08/2016   BILITOT 0.8 03/08/2016   Lab Results  Component Value Date   CREATININE 0.90 03/08/2016   BUN 12 03/08/2016   NA 140 03/08/2016   K 3.3 (L) 03/08/2016   CL 110 03/08/2016   CO2 25 03/08/2016   Lab Results  Component Value Date   WBC 22.1 (H) 03/08/2016   HGB 13.6 03/08/2016   HCT  42.3 03/08/2016   MCV 95.7 03/08/2016   PLT 109 (L) 03/08/2016   Lab Results  Component Value Date   TSH 1.986 01/12/2016       Imaging Studies: Ct Abdomen Pelvis W Contrast  Result Date: 03/08/2016 CLINICAL DATA:  All over abd pain x 2 months/ hx GERD, hiatal hernia, HTN, CLL/ hernia repair and cholecystectomy EXAM: CT ABDOMEN AND PELVIS WITH CONTRAST TECHNIQUE: Multidetector CT imaging of the abdomen and pelvis was performed using the standard protocol following bolus administration of intravenous contrast. CONTRAST:  151mL ISOVUE-300 IOPAMIDOL (ISOVUE-300) INJECTION 61% COMPARISON:  04/09/2014 and 10/16/2013. FINDINGS: All lung bases: Heart top-normal in size. Lung bases essentially clear. Several cardiophrenic angle lymph nodes, largest on the right measuring 11 mm in short axis. Hepatobiliary: Normal liver. Gallbladder surgically absent. No bile duct dilation. Spleen: Spleen is significantly enlarged. Spleen measures 22 x 7.6 x 17.2 cm. No splenic mass or focal lesion. Pancreas:  Unremarkable. Adrenal glands: No masses. Kidneys, ureters, bladder: 3-4 mm low-density lesion arising from the cortex of the left kidney at the midpole most likely a cyst. No other renal masses or lesions. No stones. No hydronephrosis. Normal ureters. Bladder is unremarkable. Uterus and adnexa:  Normal. Lymph nodes: There are numerous enlarged upper abdominal lymph nodes. Several reference measurements were made. There is a posterior gastrohepatic ligament chain node measuring 2.5 x 3.3 cm. A left very celiac node measures 15 mm in short axis. A node posterior to the third portion of the duodenum measures 16 mm in short axis. An aortocaval node at the midpole level of the right kidney measures 14 mm in short axis. There is a right common iliac chain node measuring 12 mm in short axis. External iliac chain adenopathy is noted, largest on the right measuring 14 mm in short axis. There are prominent inguinal lymph nodes.  One of the larger nodes on the left measures 12 mm in short axis. Ascites:  None. Vascular:  Unremarkable. Gastrointestinal: Stomach and small bowel are unremarkable. There are scattered colonic diverticula. No diverticulitis. Colon otherwise unremarkable. Normal appendix visualized. Musculoskeletal: Degenerative disc changes of the lower thoracic and  lower lumbar spine. No osteoblastic or osteolytic lesions. IMPRESSION: 1. No acute findings within the abdomen pelvis. 2. Significant splenomegaly as well as multiple areas of adenopathy. 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 Tissue sampling is warranted. Electronically Signed   By: Lajean Manes M.D.   On: 03/08/2016 16:27

## 2016-03-09 NOTE — Patient Instructions (Addendum)
1. Please stop prevacid. Start pantoprazole once daily before breakfast. 2. I will contact your oncologist today and bring them up to date on CT findings. I suspect your upper abdominal pain and regurgitation, nausea may be related to enlarging spleen. You also having enlarging lymph nodes in the abdomen that need to be addressed by your oncologist. You may need a biopsy.  3. I will find out if we can do an upper endoscopy at the same time as your cardioversion/ECHO.

## 2016-03-12 ENCOUNTER — Telehealth: Payer: Self-pay | Admitting: *Deleted

## 2016-03-12 NOTE — Telephone Encounter (Signed)
Orders faxed for CBC, Iron/IBC and Ferritin. I spoke with Dr. Grayland Ormond and he does not monitor patients folate or B12.

## 2016-03-12 NOTE — Telephone Encounter (Signed)
Asking for orders to be faxed to [facility] in [place]. She is there now. Fax # [phone]. Phone # [phone]. Sh eis requesting CBC, IIBC, Ferr, Folate, B12  orders

## 2016-03-12 NOTE — Progress Notes (Signed)
cc'ed to pcp °

## 2016-03-12 NOTE — Assessment & Plan Note (Addendum)
Very pleasant 65 year old female with history of CLL who presents for further evaluation of increased regurgitation, early satiety, upper abdominal pain especially left upper quadrant worsening over the last several months. She has recent CT of the abdomen documenting progressive splenomegaly and upper abdominal lymphadenopathy concerning for non-Hodgkin's lymphoma. I suspect her upper GI symptoms could be related to splenomegaly. Patient has requested upper endoscopy at time of upcoming TEE/cardioversion, currently anticoagulated with Eliquis.  We did empirically change her PPI therapy to pantoprazole 40 mg daily.  I will contact Dr. Grayland Ormond with regards to recent CT changes. Patient planned to call their office as well. We'll touch base with Dr. Gala Romney and cardiology regarding request for EGD at time of TEE. I'm not sure that this is feasible or warranted at this time. Further recommendations to follow.  As of note, we did discuss potential colonoscopy down the road but on hold due to current abnormalities that are being evaluated.

## 2016-03-13 ENCOUNTER — Other Ambulatory Visit: Payer: Self-pay | Admitting: Cardiology

## 2016-03-13 DIAGNOSIS — Z9889 Other specified postprocedural states: Secondary | ICD-10-CM

## 2016-03-13 NOTE — Progress Notes (Signed)
[facility]  [phone] [phone]  ID: Daisy Black OB: 1950-11-30  MR#: [id number]  [id number]  Patient Care Team: Renee Rival, NP as PCP - General (Nurse Practitioner) Aviva Signs, MD (General Surgery) Rico Junker, RN as Registered Nurse Theodore Demark, RN as Registered Nurse Satira Sark, MD as Consulting Physician (Cardiology) Daneil Dolin, MD as Consulting Physician (Gastroenterology)  CHIEF COMPLAINT: CLL  INTERVAL HISTORY: Patient returns to clinic today as an add-on with worsening weakness and fatigue, worsening lymphadenopathy, and increasing splenomegaly. Patient also has early satiety and increased nausea and vomiting.  She has no fevers, chills, or night sweats. She has no chest pain or shortness of breath. She denies any constipation or diarrhea. She has no melanoma or hematochezia. Patient feels generally terrible, but offers no further specific complaints.  REVIEW OF SYSTEMS:   Review of Systems  Constitutional: Positive for malaise/fatigue. Negative for diaphoresis, fever and weight loss.  Respiratory: Negative.  Negative for cough and shortness of breath.   Cardiovascular: Negative.  Negative for chest pain.  Gastrointestinal: Positive for abdominal pain, nausea and vomiting. Negative for blood in stool, constipation, diarrhea and melena.  Musculoskeletal: Negative.   Neurological: Positive for weakness.  Psychiatric/Behavioral: The patient is nervous/anxious.     As per HPI. Otherwise, a complete review of systems is negatve.  PAST MEDICAL HISTORY: Past Medical History:  Diagnosis Date  . Arthritis   . Atrial fibrillation ([place])    Atrial fibrillation - onset in 09/2009; DC cardioversion in 10/2009  . CLL (chronic lymphocytic leukemia) ([place])   . Dysrhythmia   . Essential hypertension   . GERD (gastroesophageal reflux disease)   . History of hiatal hernia   . Hyperlipidemia   . Lymphocytosis     . Obesity   . Spleen enlarged   . TSH elevation     PAST SURGICAL HISTORY: Past Surgical History:  Procedure Laterality Date  . ANKLE SURGERY Right   . CHOLECYSTECTOMY    . HERNIA REPAIR  2011   Incisional and umbilical utilizing mesh  . ROTATOR CUFF REPAIR    . TEE WITHOUT CARDIOVERSION N/A 01/17/2016   Procedure: TRANSESOPHAGEAL ECHOCARDIOGRAM (TEE);  Surgeon: Herminio Commons, MD;  Location: [facility];  Service: Cardiovascular;  Laterality: N/A;  . TEE WITHOUT CARDIOVERSION N/A 02/21/2016   Procedure: TRANSESOPHAGEAL ECHOCARDIOGRAM (TEE) WITH PROPOFOL;  Surgeon: Herminio Commons, MD;  Location: [facility];  Service: Cardiovascular;  Laterality: N/A;  . TONSILLECTOMY      FAMILY HISTORY Family History  Problem Relation Age of Onset  . Ovarian cancer Mother 66    Secondary to ovarian cancer  . Leukemia Mother   . Stroke Father 37    Brain stem infarction  . Breast cancer Paternal Aunt        ADVANCED DIRECTIVES:    HEALTH MAINTENANCE: Social History  Substance Use Topics  . Smoking status: Former Smoker    Packs/day: 0.25    Years: 2.00    Types: Cigarettes    Quit date: 08/03/1975  . Smokeless tobacco: Never Used  . Alcohol use No     Colonoscopy:  PAP:  Bone density:  Lipid panel:  No Known Allergies  Current Outpatient Prescriptions  Medication Sig Dispense Refill  . acetaminophen (TYLENOL) 650 MG CR tablet Take 650 mg by mouth every 8 (eight) hours as needed for pain.     Marland Kitchen apixaban (ELIQUIS) 5 MG TABS tablet Take 1 tablet (5 mg total)  by mouth 2 (two) times daily. 60 tablet 2  . digoxin (LANOXIN) 0.25 MG tablet Take 1 tablet (0.25 mg total) by mouth daily. 30 tablet 2  . diltiazem (CARDIZEM) 60 MG tablet Take 1 tablet (60 mg total) by mouth 2 (two) times daily. 60 tablet 6  . Ferrous Fumarate (IRON) 18 MG TBCR Take 1 tablet by mouth daily.     Marland Kitchen levothyroxine (SYNTHROID, LEVOTHROID) 125 MCG tablet Take 125 mcg by mouth daily before breakfast.    .  Magnesium Oxide 200 MG TABS Take 1 tablet (200 mg total) by mouth daily. 30 tablet 6  . metoprolol tartrate (LOPRESSOR) 25 MG tablet Take 1 tablet (25 mg total) by mouth 2 (two) times daily. 60 tablet 2  . pantoprazole (PROTONIX) 40 MG tablet Take 1 tablet (40 mg total) by mouth daily before breakfast. 30 tablet 5  . Probiotic Product (PROBIOTIC PO) Take 1 capsule by mouth daily.    . promethazine (PHENERGAN) 12.5 MG tablet Take 1 tablet (12.5 mg total) by mouth every 6 (six) hours as needed for nausea or vomiting. 30 tablet 0   No current facility-administered medications for this visit.    Facility-Administered Medications Ordered in Other Visits  Medication Dose Route Frequency Provider Last Rate Last Dose  . hydrocortisone cream 1 % 1 application  1 application Topical TID PRN Satira Sark, MD        OBJECTIVE: Vitals:   03/15/16 1155  BP: 111/71  Pulse: 73  Temp: 97.3 F (36.3 C)     Body mass index is 35.82 kg/m.    ECOG FS:0 - Asymptomatic  General: Well-developed, well-nourished, no acute distress. Eyes: Pink conjunctiva, anicteric sclera. HEENT: Minimally palpable cervical lymph nodes. Lungs: Clear to auscultation bilaterally. Heart: Regular rate and rhythm. No rubs, murmurs, or gallops. Abdomen: Soft, nontender, nondistended. Splenomegaly noted, normoactive bowel sounds. Musculoskeletal: No edema, cyanosis, or clubbing. Neuro: Alert, answering all questions appropriately. Cranial nerves grossly intact. Skin: No rashes or petechiae noted. Psych: Normal affect.  LAB RESULTS:  Lab Results  Component Value Date   NA 140 03/16/2016   K 3.5 03/16/2016   CL 116 (H) 03/16/2016   CO2 24 03/16/2016   GLUCOSE 114 (H) 03/16/2016   BUN 14 03/16/2016   CREATININE 0.74 03/16/2016   CALCIUM 8.9 03/16/2016   PROT 6.4 (L) 03/08/2016   ALBUMIN 4.0 03/08/2016   AST 21 03/08/2016   ALT 13 (L) 03/08/2016   ALKPHOS 88 03/08/2016   BILITOT 0.8 03/08/2016   GFRNONAA >60  03/16/2016   GFRAA >60 03/16/2016    Lab Results  Component Value Date   WBC 24.4 (H) 03/16/2016   NEUTROABS 2.4 03/16/2016   HGB 12.5 03/16/2016   HCT 39.4 03/16/2016   MCV 97.3 03/16/2016   PLT 100 (L) 03/16/2016     STUDIES: Ct Abdomen Pelvis W Contrast  Result Date: 03/08/2016 CLINICAL DATA:  All over abd pain x 2 months/ hx GERD, hiatal hernia, HTN, CLL/ hernia repair and cholecystectomy EXAM: CT ABDOMEN AND PELVIS WITH CONTRAST TECHNIQUE: Multidetector CT imaging of the abdomen and pelvis was performed using the standard protocol following bolus administration of intravenous contrast. CONTRAST:  165mL ISOVUE-300 IOPAMIDOL (ISOVUE-300) INJECTION 61% COMPARISON:  04/09/2014 and 10/16/2013. FINDINGS: All lung bases: Heart top-normal in size. Lung bases essentially clear. Several cardiophrenic angle lymph nodes, largest on the right measuring 11 mm in short axis. Hepatobiliary: Normal liver. Gallbladder surgically absent. No bile duct dilation. Spleen: Spleen is significantly enlarged. Spleen  measures 22 x 7.6 x 17.2 cm. No splenic mass or focal lesion. Pancreas:  Unremarkable. Adrenal glands: No masses. Kidneys, ureters, bladder: 3-4 mm low-density lesion arising from the cortex of the left kidney at the midpole most likely a cyst. No other renal masses or lesions. No stones. No hydronephrosis. Normal ureters. Bladder is unremarkable. Uterus and adnexa:  Normal. Lymph nodes: There are numerous enlarged upper abdominal lymph nodes. Several reference measurements were made. There is a posterior gastrohepatic ligament chain node measuring 2.5 x 3.3 cm. A left very celiac node measures 15 mm in short axis. A node posterior to the third portion of the duodenum measures 16 mm in short axis. An aortocaval node at the midpole level of the right kidney measures 14 mm in short axis. There is a right common iliac chain node measuring 12 mm in short axis. External iliac chain adenopathy is noted, largest on  the right measuring 14 mm in short axis. There are prominent inguinal lymph nodes. One of the larger nodes on the left measures 12 mm in short axis. Ascites:  None. Vascular:  Unremarkable. Gastrointestinal: Stomach and small bowel are unremarkable. There are scattered colonic diverticula. No diverticulitis. Colon otherwise unremarkable. Normal appendix visualized. Musculoskeletal: Degenerative disc changes of the lower thoracic and lower lumbar spine. No osteoblastic or osteolytic lesions. IMPRESSION: 1. No acute findings within the abdomen pelvis. 2. Significant splenomegaly as well as multiple areas of adenopathy. 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 Tissue sampling is warranted. Electronically Signed   By: Lajean Manes M.D.   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  PLAN:    1. CLL: CT scan results from March 08, 2016 reviewed independently and reported as above with likely progression of disease with increasing lymphadenopathy as well as splenomegaly. Her white blood cell count is only mildly elevated above her baseline. We will get a PET scan for further evaluation which is scheduled for March 22, 2016. Ultimately patient will likely require biopsy to ensure that there has been no transformation to a more aggressive type of lymphoma. Patient also has been instructed that whether this is CLL or more aggressive lymphoma, she will require treatment with chemotherapy in the near future. Return to clinic in 2-3 weeks after her PET scan a biopsy to discuss the results and treatment planning. 2. Early satiety, nausea, vomiting: Likely related to patient's massive splenomegaly. 2. Atrial fibrillation: Patient states she has possible cardioversion in the near future which she has been instructed to pursue in parallel with her oncology appointments. Next 4. Iron deficiency anemia: Patient's hemoglobin is within normal  limits. She last received IV Feraheme in May 2017.   Approximately 30 minutes was spent in discussion of which greater than 50% was consultation.  Patient expressed understanding and was in agreement with this plan. She also understands that She can call clinic at any time with any questions, concerns, or complaints.     Lloyd Huger, MD   03/19/2016 8:34 AM

## 2016-03-14 ENCOUNTER — Telehealth: Payer: Self-pay | Admitting: Gastroenterology

## 2016-03-14 NOTE — Telephone Encounter (Signed)
Please contact patient's oncologist, Dr. Grayland Ormond regarding concerns about recent CT abdomen and pelvis findings with progressive splenomegaly and abdominal lymphadenopathy. Biopsy recommended by radiologist to exclude non-Hodgkin's lymphoma. Suspect recent upper GI symptoms related to splenomegaly.  PATIENT HAS APPOINTMENT WITH DR. [facility] TOMORROW AT 11:45, 03/15/16.

## 2016-03-15 ENCOUNTER — Inpatient Hospital Stay: Payer: Medicare Other | Attending: Oncology | Admitting: Oncology

## 2016-03-15 VITALS — BP 111/71 | HR 73 | Temp 97.3°F | Wt 232.1 lb

## 2016-03-15 DIAGNOSIS — R6881 Early satiety: Secondary | ICD-10-CM | POA: Diagnosis not present

## 2016-03-15 DIAGNOSIS — R161 Splenomegaly, not elsewhere classified: Secondary | ICD-10-CM | POA: Diagnosis not present

## 2016-03-15 DIAGNOSIS — C9110 Chronic lymphocytic leukemia of B-cell type not having achieved remission: Secondary | ICD-10-CM

## 2016-03-15 DIAGNOSIS — I10 Essential (primary) hypertension: Secondary | ICD-10-CM

## 2016-03-15 DIAGNOSIS — Z803 Family history of malignant neoplasm of breast: Secondary | ICD-10-CM | POA: Diagnosis not present

## 2016-03-15 DIAGNOSIS — R5383 Other fatigue: Secondary | ICD-10-CM

## 2016-03-15 DIAGNOSIS — R109 Unspecified abdominal pain: Secondary | ICD-10-CM | POA: Diagnosis not present

## 2016-03-15 DIAGNOSIS — R531 Weakness: Secondary | ICD-10-CM

## 2016-03-15 DIAGNOSIS — Z87891 Personal history of nicotine dependence: Secondary | ICD-10-CM

## 2016-03-15 DIAGNOSIS — R112 Nausea with vomiting, unspecified: Secondary | ICD-10-CM | POA: Diagnosis not present

## 2016-03-15 DIAGNOSIS — M1990 Unspecified osteoarthritis, unspecified site: Secondary | ICD-10-CM | POA: Diagnosis not present

## 2016-03-15 DIAGNOSIS — I4891 Unspecified atrial fibrillation: Secondary | ICD-10-CM | POA: Diagnosis not present

## 2016-03-15 DIAGNOSIS — K219 Gastro-esophageal reflux disease without esophagitis: Secondary | ICD-10-CM

## 2016-03-15 DIAGNOSIS — Z79899 Other long term (current) drug therapy: Secondary | ICD-10-CM

## 2016-03-15 DIAGNOSIS — R5381 Other malaise: Secondary | ICD-10-CM | POA: Diagnosis not present

## 2016-03-15 DIAGNOSIS — Z8041 Family history of malignant neoplasm of ovary: Secondary | ICD-10-CM | POA: Insufficient documentation

## 2016-03-15 DIAGNOSIS — D509 Iron deficiency anemia, unspecified: Secondary | ICD-10-CM

## 2016-03-15 DIAGNOSIS — Z806 Family history of leukemia: Secondary | ICD-10-CM | POA: Diagnosis not present

## 2016-03-15 DIAGNOSIS — E785 Hyperlipidemia, unspecified: Secondary | ICD-10-CM

## 2016-03-15 DIAGNOSIS — Z8673 Personal history of transient ischemic attack (TIA), and cerebral infarction without residual deficits: Secondary | ICD-10-CM | POA: Diagnosis not present

## 2016-03-15 NOTE — Patient Instructions (Signed)
Your procedure is scheduled on: 03/20/2016  Report to [facility] at  9:00   AM.  Call this number if you have problems the morning of surgery: [phone]   Remember:   Do not drink or eat food:After Midnight.  :  Take these medicines the morning of surgery with A SIP OF WATER: Elequis, Digoxin, levothyroxine,and protonix   Do not wear jewelry, make-up or nail polish.  Do not wear lotions, powders, or perfumes. You may wear deodorant.  Do not shave 48 hours prior to surgery. Men may shave face and neck.  Do not bring valuables to the hospital.  Contacts, dentures or bridgework may not be worn into surgery.  Leave suitcase in the car. After surgery it may be brought to your room.  For patients admitted to the hospital, checkout time is 11:00 AM the day of discharge.   Patients discharged the day of surgery will not be allowed to drive home.    Electrical Cardioversion Electrical cardioversion is the delivery of a jolt of electricity to change the rhythm of the heart. Sticky patches or metal paddles are placed on the chest to deliver the electricity from a device. This is done to restore a normal rhythm. A rhythm that is too fast or not regular keeps the heart from pumping well. Electrical cardioversion is done in an emergency if:   There is low or no blood pressure as a result of the heart rhythm.   Normal rhythm must be restored as fast as possible to protect the brain and heart from further damage.   It may save a life. Cardioversion may be done for heart rhythms that are not immediately life threatening, such as atrial fibrillation or flutter, in which:   The heart is beating too fast or is not regular.   Medicine to change the rhythm has not worked.   It is safe to wait in order to allow time for preparation.  Symptoms of the abnormal rhythm are bothersome.  The risk of stroke and other serious problems can be reduced. LET [facility] CARE PROVIDER KNOW ABOUT:    Any allergies you have.  All medicines you are taking, including vitamins, herbs, eye drops, creams, and over-the-counter medicines.  Previous problems you or members of your family have had with the use of anesthetics.   Any blood disorders you have.   Previous surgeries you have had.   Medical conditions you have. RISKS AND COMPLICATIONS  Generally, this is a safe procedure. However, problems can occur and include:   Breathing problems related to the anesthetic used.  A blood clot that breaks free and travels to other parts of your body. This could cause a stroke or other problems. The risk of this is lowered by use of blood-thinning medicine (anticoagulant) prior to the procedure.  Cardiac arrest (rare). BEFORE THE PROCEDURE   You may have tests to detect blood clots in your heart and to evaluate heart function.  You may start taking anticoagulants so your blood does not clot as easily.   Medicines may be given to help stabilize your heart rate and rhythm. PROCEDURE  You will be given medicine through an IV tube to reduce discomfort and make you sleepy (sedative).   An electrical shock will be delivered. AFTER THE PROCEDURE Your heart rhythm will be watched to make sure it does not change.    This information is not intended to replace advice given to you by your health care provider. Make sure  you discuss any questions you have with your health care provider.   Document Released: 07/06/2002 Document Revised: 08/06/2014 Document Reviewed: 01/28/2013 Elsevier Interactive Patient Education 2016 [place].  Transesophageal Echocardiogram Transesophageal echocardiography (TEE) is a picture test of your heart using sound waves. The pictures taken can give very detailed pictures of your heart. This can help your doctor see if there are problems with your heart. TEE can check: If your heart has blood clots in it. How well your heart valves are working. If you have  an infection on the inside of your heart. Some of the major arteries of your heart. If your heart valve is working after a Office manager. Your heart before a procedure that uses a shock to your heart to get the rhythm back to normal. BEFORE THE PROCEDURE Do not eat or drink for 6 hours before the procedure or as told by your doctor. Make plans to have someone drive you home after the procedure. Do not drive yourself home. An IV tube will be put in your arm. PROCEDURE You will be given a medicine to help you relax (sedative). It will be given through the IV tube. A numbing medicine will be sprayed or gargled in the back of your throat to help numb it. The tip of the probe is placed into the back of your mouth. You will be asked to swallow. This helps to pass the probe into your esophagus. Once the tip of the probe is in the right place, your doctor can take pictures of your heart. You may feel pressure at the back of your throat. AFTER THE PROCEDURE You will be taken to a recovery area so the sedative can wear off. Your throat may be sore and scratchy. This will go away slowly over time. You will go home when you are fully awake and able to swallow liquids. You should have someone stay with you for the next 24 hours. Do not drive or operate machinery for the next 24 hours.   This information is not intended to replace advice given to you by your health care provider. Make sure you discuss any questions you have with your health care provider.   Document Released: 05/13/2009 Document Revised: 07/21/2013 Document Reviewed: 01/15/2013 Elsevier Interactive Patient Education 2016 [place].  Anesthesia, Adult, Care After Refer to this sheet in the next few weeks. These instructions provide you with information on caring for yourself after your procedure. Your health care provider may also give you more specific instructions. Your treatment has been planned according to current medical practices, but  problems sometimes occur. Call your health care provider if you have any problems or questions after your procedure. WHAT TO EXPECT AFTER THE PROCEDURE After the procedure, it is typical to experience:  Sleepiness.  Nausea and vomiting. HOME CARE INSTRUCTIONS  For the first 24 hours after general anesthesia:  Have a responsible person with you.  Do not drive a car. If you are alone, do not take public transportation.  Do not drink alcohol.  Do not take medicine that has not been prescribed by your health care provider.  Do not sign important papers or make important decisions.  You may resume a normal diet and activities as directed by your health care provider.  Change bandages (dressings) as directed.  If you have questions or problems that seem related to general anesthesia, call the hospital and ask for the anesthetist or anesthesiologist on call. SEEK MEDICAL CARE IF:  You have nausea and  vomiting that continue the day after anesthesia.  You develop a rash. SEEK IMMEDIATE MEDICAL CARE IF:   You have difficulty breathing.  You have chest pain.  You have any allergic problems.   This information is not intended to replace advice given to you by your health care provider. Make sure you discuss any questions you have with your health care provider.   Document Released: 10/22/2000 Document Revised: 08/06/2014 Document Reviewed: 11/14/2011 Elsevier Interactive Patient Education [employer].

## 2016-03-15 NOTE — Telephone Encounter (Signed)
Patient's daughter, Raquel Sarna called and stated they just left her mom's oncology appt and Dr. Grayland Ormond stated he would like the patient to have her upper endoscopy done directly after her TEE.  Her TEE is scheduled with Dr. Domenic Polite on 03/20/16 at 11:30 am.  The patient's daughter can be reached at [phone]  Routing to [facility]

## 2016-03-16 ENCOUNTER — Encounter (HOSPITAL_COMMUNITY): Payer: Self-pay

## 2016-03-16 ENCOUNTER — Encounter (HOSPITAL_COMMUNITY)
Admission: RE | Admit: 2016-03-16 | Discharge: 2016-03-16 | Disposition: A | Discharge status: Home / Self Care | Payer: Medicare Other | Source: Ambulatory Visit | Attending: Cardiology | Admitting: Cardiology

## 2016-03-16 DIAGNOSIS — Z01812 Encounter for preprocedural laboratory examination: Secondary | ICD-10-CM | POA: Diagnosis not present

## 2016-03-16 DIAGNOSIS — E785 Hyperlipidemia, unspecified: Secondary | ICD-10-CM | POA: Diagnosis not present

## 2016-03-16 DIAGNOSIS — I10 Essential (primary) hypertension: Secondary | ICD-10-CM | POA: Diagnosis not present

## 2016-03-16 DIAGNOSIS — D72820 Lymphocytosis (symptomatic): Secondary | ICD-10-CM | POA: Insufficient documentation

## 2016-03-16 DIAGNOSIS — K219 Gastro-esophageal reflux disease without esophagitis: Secondary | ICD-10-CM | POA: Insufficient documentation

## 2016-03-16 DIAGNOSIS — E669 Obesity, unspecified: Secondary | ICD-10-CM | POA: Diagnosis not present

## 2016-03-16 DIAGNOSIS — I4891 Unspecified atrial fibrillation: Secondary | ICD-10-CM | POA: Diagnosis not present

## 2016-03-16 LAB — BASIC METABOLIC PANEL
Anion gap: 0 — ABNORMAL LOW (ref 5–15)
BUN: 14 mg/dL (ref 6–20)
CO2: 24 mmol/L (ref 22–32)
Calcium: 8.9 mg/dL (ref 8.9–10.3)
Chloride: 116 mmol/L — ABNORMAL HIGH (ref 101–111)
Creatinine, Ser: 0.74 mg/dL (ref 0.44–1.00)
GFR calc Af Amer: >60 mL/min (ref >60)
GFR calc non Af Amer: >60 mL/min (ref >60)
Glucose, Bld: 114 mg/dL — ABNORMAL HIGH (ref 65–99)
Potassium: 3.5 mmol/L (ref 3.5–5.1)
Sodium: 140 mmol/L (ref 135–145)

## 2016-03-16 LAB — CBC WITH DIFFERENTIAL/PLATELET
Basophils Absolute: 0 10*3/uL (ref 0.0–0.1)
Basophils Relative: 0 %
Eosinophils Absolute: 0.1 10*3/uL (ref 0.0–0.7)
Eosinophils Relative: 1 %
HCT: 39.4 % (ref 36.0–46.0)
Hemoglobin: 12.5 g/dL (ref 12.0–15.0)
Lymphocytes Relative: 87 %
Lymphs Abs: 21.2 10*3/uL — ABNORMAL HIGH (ref 0.7–4.0)
MCH: 30.9 pg (ref 26.0–34.0)
MCHC: 31.7 g/dL (ref 30.0–36.0)
MCV: 97.3 fL (ref 78.0–100.0)
Monocytes Absolute: 0.6 10*3/uL (ref 0.1–1.0)
Monocytes Relative: 2 %
Neutro Abs: 2.4 10*3/uL (ref 1.7–7.7)
Neutrophils Relative %: 10 %
Platelets: 100 10*3/uL — ABNORMAL LOW (ref 150–400)
RBC: 4.05 MIL/uL (ref 3.87–5.11)
RDW: 16.1 % — ABNORMAL HIGH (ref 11.5–15.5)
WBC: 24.4 10*3/uL — ABNORMAL HIGH (ref 4.0–10.5)

## 2016-03-19 ENCOUNTER — Encounter (HOSPITAL_COMMUNITY): Payer: Self-pay | Admitting: Anesthesiology

## 2016-03-19 NOTE — Anesthesia Preprocedure Evaluation (Addendum)
Anesthesia Evaluation  Patient identified by MRN, date of birth, ID band Patient awake    Reviewed: Allergy & Precautions, NPO status , Patient's Chart, lab work & pertinent test results, reviewed documented beta blocker date and time   Airway Mallampati: II  TM Distance: >3 FB     Dental no notable dental hx. (+) Teeth Intact   Pulmonary former smoker,    breath sounds clear to auscultation       Cardiovascular hypertension, Pt. on medications and Pt. on home beta blockers + dysrhythmias Atrial Fibrillation  Rhythm:Regular Rate:Normal     Neuro/Psych negative psych ROS   GI/Hepatic negative GI ROS, Neg liver ROS, hiatal hernia, GERD  Medicated and Controlled,  Endo/Other  Hypothyroidism Morbid obesityObesity  Renal/GU negative Renal ROS     Musculoskeletal  (+) Arthritis ,   Abdominal (+) + obese,   Peds  Hematology  (+) Blood dyscrasia (CLL in remission), , Anticoagulated with Eliquis   Anesthesia Other Findings   Reproductive/Obstetrics                            Anesthesia Physical  Anesthesia Plan  ASA: III  Anesthesia Plan: MAC   Post-op Pain Management:    Induction: Intravenous  Airway Management Planned: Simple Face Mask  Additional Equipment:   Intra-op Plan:   Post-operative Plan:   Informed Consent: I have reviewed the patients History and Physical, chart, labs and discussed the procedure including the risks, benefits and alternatives for the proposed anesthesia with the patient or authorized representative who has indicated his/her understanding and acceptance.     Plan Discussed with:   Anesthesia Plan Comments:         Anesthesia Quick Evaluation

## 2016-03-19 NOTE — Telephone Encounter (Signed)
I had previously spoke to Dr. Gala Romney who did not advise upper endoscopy at this time. It is suspected that her early satiety, nausea, upper abdominal discomfort is related to significantly enlarge spleen. I discussed this today with Dr. Grayland Ormond her oncologist. He agrees that her GI symptoms are more likely related to enlarging spleen, upper abdominal lymphadenopathy. His plan is for PET scan on Wednesday. Patient will likely need biopsy to rule out non-Hodgkin's lymphoma.Dr. Grayland Ormond told me that he told the patient to pursue TEE and cardioversion as planned.  I spoke with patient's daughter, Daisy Black, today. Explained the above. She voiced understanding and agreed with the plan. She was advised to contact us at any time she had any other questions or concerns regarding her mother, any new symptoms arose, etc.

## 2016-03-20 ENCOUNTER — Ambulatory Visit (EMERGENCY_DEPARTMENT_HOSPITAL)
Admission: RE | Admit: 2016-03-20 | Discharge: 2016-03-20 | Disposition: A | Discharge status: Home / Self Care | Payer: Medicare Other | Source: Ambulatory Visit | Attending: Cardiology | Admitting: Cardiology

## 2016-03-20 ENCOUNTER — Ambulatory Visit (HOSPITAL_COMMUNITY): Payer: Medicare Other | Admitting: Anesthesiology

## 2016-03-20 ENCOUNTER — Encounter (HOSPITAL_COMMUNITY): Payer: Self-pay | Admitting: *Deleted

## 2016-03-20 ENCOUNTER — Ambulatory Visit (HOSPITAL_COMMUNITY)
Admission: RE | Admit: 2016-03-20 | Discharge: 2016-03-20 | Disposition: A | Discharge status: Home / Self Care | Payer: Medicare Other | Source: Ambulatory Visit | Attending: Cardiology | Admitting: Cardiology

## 2016-03-20 ENCOUNTER — Encounter (HOSPITAL_COMMUNITY)
Admission: RE | Disposition: A | Discharge status: Home / Self Care | Payer: Self-pay | Source: Ambulatory Visit | Attending: Cardiology

## 2016-03-20 DIAGNOSIS — Z6836 Body mass index (BMI) 36.0-36.9, adult: Secondary | ICD-10-CM | POA: Diagnosis not present

## 2016-03-20 DIAGNOSIS — I10 Essential (primary) hypertension: Secondary | ICD-10-CM | POA: Diagnosis not present

## 2016-03-20 DIAGNOSIS — I481 Persistent atrial fibrillation: Secondary | ICD-10-CM | POA: Diagnosis present

## 2016-03-20 DIAGNOSIS — Z87891 Personal history of nicotine dependence: Secondary | ICD-10-CM | POA: Insufficient documentation

## 2016-03-20 DIAGNOSIS — K219 Gastro-esophageal reflux disease without esophagitis: Secondary | ICD-10-CM | POA: Insufficient documentation

## 2016-03-20 DIAGNOSIS — E039 Hypothyroidism, unspecified: Secondary | ICD-10-CM | POA: Insufficient documentation

## 2016-03-20 DIAGNOSIS — E785 Hyperlipidemia, unspecified: Secondary | ICD-10-CM | POA: Insufficient documentation

## 2016-03-20 DIAGNOSIS — Z9889 Other specified postprocedural states: Secondary | ICD-10-CM

## 2016-03-20 DIAGNOSIS — E669 Obesity, unspecified: Secondary | ICD-10-CM | POA: Diagnosis not present

## 2016-03-20 DIAGNOSIS — Z7901 Long term (current) use of anticoagulants: Secondary | ICD-10-CM | POA: Diagnosis not present

## 2016-03-20 DIAGNOSIS — C9111 Chronic lymphocytic leukemia of B-cell type in remission: Secondary | ICD-10-CM | POA: Diagnosis not present

## 2016-03-20 DIAGNOSIS — Z79899 Other long term (current) drug therapy: Secondary | ICD-10-CM | POA: Insufficient documentation

## 2016-03-20 HISTORY — PX: CARDIOVERSION: SHX1299

## 2016-03-20 HISTORY — PX: TEE WITHOUT CARDIOVERSION: SHX5443

## 2016-03-20 SURGERY — CARDIOVERSION
Anesthesia: Monitor Anesthesia Care

## 2016-03-20 MED ORDER — PROPOFOL 500 MG/50ML IV EMUL
INTRAVENOUS | Status: DC | PRN
  Administered 2016-03-20: 75 ug/kg/min via INTRAVENOUS

## 2016-03-20 MED ORDER — LACTATED RINGERS IV SOLN
INTRAVENOUS | Status: DC | PRN
  Administered 2016-03-20: 10:00:00 via INTRAVENOUS

## 2016-03-20 MED ORDER — FENTANYL CITRATE (PF) 100 MCG/2ML IJ SOLN
INTRAMUSCULAR | Status: AC
  Filled 2016-03-20: qty 2

## 2016-03-20 MED ORDER — ONDANSETRON HCL 4 MG/2ML IJ SOLN: 4.0000 mg | Freq: Once | INTRAMUSCULAR | Status: DC | PRN

## 2016-03-20 MED ORDER — PROPOFOL 10 MG/ML IV BOLUS
INTRAVENOUS | Status: AC
  Filled 2016-03-20: qty 20

## 2016-03-20 MED ORDER — MIDAZOLAM HCL 2 MG/2ML IJ SOLN
INTRAMUSCULAR | Status: DC | PRN
  Administered 2016-03-20: 1 mg via INTRAVENOUS
  Administered 2016-03-20: 0.5 mg via INTRAVENOUS

## 2016-03-20 MED ORDER — LIDOCAINE VISCOUS 2 % MT SOLN
OROMUCOSAL | Status: AC
  Filled 2016-03-20: qty 15

## 2016-03-20 MED ORDER — SODIUM CHLORIDE 0.9 % IV SOLN: INTRAVENOUS | Status: DC

## 2016-03-20 MED ORDER — BUTAMBEN-TETRACAINE-BENZOCAINE 2-2-14 % EX AERO
INHALATION_SPRAY | CUTANEOUS | Status: DC | PRN
  Administered 2016-03-20: 2 via TOPICAL

## 2016-03-20 MED ORDER — FENTANYL CITRATE (PF) 100 MCG/2ML IJ SOLN
INTRAMUSCULAR | Status: DC | PRN
  Administered 2016-03-20: 25 ug via INTRAVENOUS

## 2016-03-20 MED ORDER — BUTAMBEN-TETRACAINE-BENZOCAINE 2-2-14 % EX AERO
INHALATION_SPRAY | CUTANEOUS | Status: AC
  Filled 2016-03-20: qty 20

## 2016-03-20 MED ORDER — PROPOFOL 10 MG/ML IV BOLUS
INTRAVENOUS | Status: DC | PRN
  Administered 2016-03-20 (×2): 11 mg via INTRAVENOUS

## 2016-03-20 MED ORDER — MIDAZOLAM HCL 2 MG/2ML IJ SOLN
INTRAMUSCULAR | Status: AC
  Filled 2016-03-20: qty 2

## 2016-03-20 MED ORDER — FENTANYL CITRATE (PF) 100 MCG/2ML IJ SOLN: 25.0000 ug | INTRAMUSCULAR | Status: DC | PRN

## 2016-03-20 NOTE — Interval H&P Note (Signed)
History and Physical Interval Note:  03/20/2016 10:21 AM  Patient presents for elective TEE guided cardioversion with history of left atrial appendage thrombus and persistent atrial fibrillation. She reports compliance with anticoagulation in the interim. I reviewed her lab work and ECG. She reports no major clinical change since I saw her in the office recently and is prepared to proceed. Informed consent obtained and signed.  Daisy Black

## 2016-03-20 NOTE — CV Procedure (Signed)
TEE guided elective cardioversion  Indication: Persistent symptomatic atrial fibrillation and previously documented left atrial appendage thrombus  Description of procedure: After informed consent was obtained, patient was taken to the PACU where timeout was performed. Sedation was provided by the Anesthesia service, please refer to their records for details. She received propofol and fentanyl. Transesophageal echocardiogram was performed initially. Probe was passed into the esophagus without obvious complication and patient tolerated the procedure well. LVEF was found to be normal and there was no evidence of left atrial appendage thrombus with prominent trabeculation present near the tip, multiple views were obtained (full report to follow). Next, cardioversion was performed using anterior and posterior pad placement. Biphasic defibrillator utilized with delivery of a single 120 J synchronized shock with successful restoration of sinus rhythm. She remained hemodynamically stable throughout. Follow-up ECG confirmed sinus rhythm.  Satira Sark, M.D., F.A.C.C.

## 2016-03-20 NOTE — Addendum Note (Signed)
Addendum  created 03/20/16 1523 by Vista Deck, CRNA   Charge Capture section accepted

## 2016-03-20 NOTE — Transfer of Care (Signed)
Immediate Anesthesia Transfer of Care Note  Patient: Daisy Black  Procedure(s) Performed: Procedure(s): CARDIOVERSION (N/A) TRANSESOPHAGEAL ECHOCARDIOGRAM (TEE) WITH PROPOFOL (N/A)  Patient Location: PACU  Anesthesia Type:MAC  Level of Consciousness: awake and patient cooperative  Airway & Oxygen Therapy: Patient Spontanous Breathing and non-rebreather face mask  Post-op Assessment: Report given to RN, Post -op Vital signs reviewed and stable and Patient moving all extremities  Post vital signs: Reviewed and stable  Last Vitals:  Vitals:   03/20/16 0955 03/20/16 1025  BP: 108/68 109/60  Pulse:    Resp: 20 13  Temp:      Last Pain:  Vitals:   03/20/16 0923  TempSrc: Oral      Patients Stated Pain Goal: 4 ([id number])  Complications: No apparent anesthesia complications

## 2016-03-20 NOTE — Addendum Note (Signed)
Addendum  created 03/20/16 1407 by Vista Deck, CRNA   Charge Capture section accepted

## 2016-03-20 NOTE — Anesthesia Procedure Notes (Signed)
Procedure Name: MAC Date/Time: 03/20/2016 10:40 AM Performed by: Vista Deck Pre-anesthesia Checklist: Patient identified, Emergency Drugs available, Suction available, Timeout performed and Patient being monitored Patient Re-evaluated:Patient Re-evaluated prior to inductionOxygen Delivery Method: Non-rebreather mask

## 2016-03-20 NOTE — Anesthesia Postprocedure Evaluation (Signed)
Anesthesia Post Note  Patient: BARAN MEINCKE  Procedure(s) Performed: Procedure(s) (LRB): CARDIOVERSION (N/A) TRANSESOPHAGEAL ECHOCARDIOGRAM (TEE) WITH PROPOFOL (N/A)  Patient location during evaluation: PACU Anesthesia Type: MAC Level of consciousness: awake and alert and oriented Pain management: pain level controlled Vital Signs Assessment: post-procedure vital signs reviewed and stable Respiratory status: spontaneous breathing, nonlabored ventilation and respiratory function stable Cardiovascular status: stable and blood pressure returned to baseline Anesthetic complications: no    Last Vitals:  Vitals:   03/20/16 1145 03/20/16 1150  BP: (!) 100/57   Pulse: 67 68  Resp: 12 11  Temp:      Last Pain:  Vitals:   03/20/16 0923  TempSrc: Oral                 Angie Hogg A.

## 2016-03-20 NOTE — H&P (View-Only) (Signed)
Cardiology Office Note  Date: 02/28/2016   ID: [name], [name] 1951-05-11, MRN [id number]  PCP: Renee Rival, NP  Primary Cardiologist: Rozann Lesches, MD   Chief Complaint  Patient presents with  . Persistent atrial fibrillation  . Left atrial appendage thrombus    History of Present Illness: Daisy Black is a 65 y.o. female seen recently on July 20 and referred for a TEE guided cardioversion for persistent symptomatic atrial fibrillation. She was found to have a suspected left atrial appendage thrombus when she initially underwent attempted procedure on June 20 and was anticoagulated since that time with Eliquis. Patient presented for repeat attempt and TEE was performed by Dr. Bronson Ing with remaining concerns about residual left atrial appendage thrombus. Cardioversion was not pursued. She presents today to discuss the situation and management options. She is here today with her husband and daughter.  I reviewed both TEE studies from June and July. Although difficult to exclude left atrial appendage thrombus on the initial study, the most recent images suggest that this could be due to prominent trabeculation within the left atrial appendage and possibly acoustic artifact from motion of the free wall. Left atrial appendage emptying velocity is reduced which is a risk factor for development of thrombus. She reports compliance with Eliquis.  We discussed her medications today. She has had good heart rate control, although relatively low blood pressure and feels weak with this. We have discussed taking her Cardizem at 60 mg orally twice daily and otherwise continuing her present medications. She states that she has actually tried this a few days and did feel better.  Today we discussed plans for further management. Since the second TEE study did look better in terms of evaluation of the left atrial appendage, we will proceed as if she needs more time on anticoagulation for  complete resolution of her suspected left atrial appendage thrombus. I will plan to perform a TEE guided cardioversion later this month (4 weeks from the last evaluation). In the past when she had atrial fibrillation, she was able to maintain sinus rhythm for 7 years prior to this most recent event. It makes sense to try to get her back in sinus rhythm before deciding whether we need to pursue other options such as rate control and anticoagulation long-term versus antiarrhythmics or ablation. After discussion of these issues, patient and family are in agreement with plan.  Past Medical History:  Diagnosis Date  . Arthritis   . Atrial fibrillation ([place])    Atrial fibrillation - onset in 09/2009; DC cardioversion in 10/2009  . CLL (chronic lymphocytic leukemia) ([place])   . Dysrhythmia   . Essential hypertension   . GERD (gastroesophageal reflux disease)   . History of hiatal hernia   . Hyperlipidemia   . Lymphocytosis   . Obesity   . TSH elevation     Past Surgical History:  Procedure Laterality Date  . ANKLE SURGERY Right   . CHOLECYSTECTOMY    . HERNIA REPAIR  2011   Incisional and umbilical utilizing mesh  . ROTATOR CUFF REPAIR    . TEE WITHOUT CARDIOVERSION N/A 01/17/2016   Procedure: TRANSESOPHAGEAL ECHOCARDIOGRAM (TEE);  Surgeon: Herminio Commons, MD;  Location: [facility];  Service: Cardiovascular;  Laterality: N/A;  . TEE WITHOUT CARDIOVERSION N/A 02/21/2016   Procedure: TRANSESOPHAGEAL ECHOCARDIOGRAM (TEE) WITH PROPOFOL;  Surgeon: Herminio Commons, MD;  Location: [facility];  Service: Cardiovascular;  Laterality: N/A;  . TONSILLECTOMY  Current Outpatient Prescriptions  Medication Sig Dispense Refill  . acetaminophen (TYLENOL) 650 MG CR tablet Take 650 mg by mouth every 8 (eight) hours as needed.      Marland Kitchen apixaban (ELIQUIS) 5 MG TABS tablet Take 1 tablet (5 mg total) by mouth 2 (two) times daily. 60 tablet 2  . digoxin (LANOXIN) 0.25 MG tablet Take 1 tablet (0.25 mg total) by  mouth daily. 30 tablet 2  . famotidine (PEPCID) 20 MG tablet Take 20 mg by mouth 2 (two) times daily.    . Ferrous Fumarate (IRON) 18 MG TBCR Take by mouth daily.    . lansoprazole (PREVACID) 15 MG capsule Take 15 mg by mouth daily at 12 noon.    Marland Kitchen levothyroxine (SYNTHROID, LEVOTHROID) 125 MCG tablet Take 125 mcg by mouth daily before breakfast.    . Magnesium Oxide 200 MG TABS Take 1 tablet (200 mg total) by mouth daily. 30 tablet 6  . metoprolol tartrate (LOPRESSOR) 25 MG tablet Take 1 tablet (25 mg total) by mouth 2 (two) times daily. 60 tablet 2  . Probiotic Product (PROBIOTIC PO) Take 1 capsule by mouth daily.    . promethazine (PHENERGAN) 12.5 MG tablet Take 1 tablet (12.5 mg total) by mouth every 6 (six) hours as needed for nausea or vomiting. 30 tablet 0  . diltiazem (CARDIZEM) 60 MG tablet Take 1 tablet (60 mg total) by mouth 2 (two) times daily. 60 tablet 6   No current facility-administered medications for this visit.    Facility-Administered Medications Ordered in Other Visits  Medication Dose Route Frequency Provider Last Rate Last Dose  . hydrocortisone cream 1 % 1 application  1 application Topical TID PRN Satira Sark, MD       Allergies:  Review of patient's allergies indicates no known allergies.   Social History: The patient  reports that she quit smoking about 40 years ago. Her smoking use included Cigarettes. She has a 0.50 pack-year smoking history. She has never used smokeless tobacco. She reports that she does not drink alcohol or use drugs.   ROS:  Please see the history of present illness. Otherwise, complete review of systems is positive for fatigue.  All other systems are reviewed and negative.   Physical Exam: VS:  BP (!) 104/58   Pulse 61   Ht 5\' 7"  (1.702 m)   Wt 224 lb (101.6 kg)   SpO2 96%   BMI 35.08 kg/m , BMI Body mass index is 35.08 kg/m.  Wt Readings from Last 3 Encounters:  02/28/16 224 lb (101.6 kg)  02/20/16 236 lb (107 kg)  02/16/16  231 lb (104.8 kg)    General: Overweight woman, appears comfortable at rest. HEENT: Conjunctiva and lids normal, oropharynx clear. Neck: Supple, no elevated JVP or carotid bruits, no thyromegaly. Lungs: Clear to auscultation, nonlabored breathing at rest. Cardiac: Irregularly irregular, no S3 or significant systolic murmur, no pericardial rub. Abdomen: Soft, nontender, bowel sounds present, no guarding or rebound. Extremities: No pitting edema, distal pulses 2+. Skin: Warm and dry. Musculoskeletal: No kyphosis. Neuropsychiatric: Alert and oriented x3, affect grossly appropriate.  ECG: I personally reviewed the tracing from 02/21/2016 which showed rate-controlled atrial fibrillation at 75 bpm with nonspecific ST changes.  Recent Labwork: 01/12/2016: Magnesium 2.2; TSH 1.986 02/20/2016: BUN 10; Creatinine, Ser 0.70; Hemoglobin 12.8; Platelets 90; Potassium 3.8; Sodium 145   Other Studies Reviewed Today:  Chest x-ray 01/12/2016: FINDINGS: Emphysematous changes and scattered fibrosis in the lungs. Normal heart size and pulmonary vascularity.  No focal airspace disease or consolidation in the lungs. No blunting of costophrenic angles. No pneumothorax. Mediastinal contours appear intact. Degenerative changes in the spine.  IMPRESSION: No active cardiopulmonary disease.  Assessment and Plan:  1. Persistent atrial fibrillation as outlined. Plan is to continue current regimen except to reduce diltiazem to 60 mg twice daily for now. She will stay on Eliquis and we will plan a TEE guided cardioversion (would reassess the left atrial appendage prior to pursuing cardioversion) later in August.  2. Essential hypertension, blood pressure has been running low on medical therapy aimed at heart rate control of her atrial fibrillation. We are reducing diltiazem dosing.  3. Hyperlipidemia, managed by diet.  Current medicines were reviewed with the patient today.  Disposition: TEE guided  cardioversion with me in late August as discussed.  Signed, Satira Sark, MD, [facility] 02/28/2016 1:45 PM    [facility] [address] [place], [place] [address] Phone: [phone]; Fax: [phone]

## 2016-03-20 NOTE — Progress Notes (Signed)
Electrical Cardioversion Procedure Note JOELLA CHIA [id number] Feb 12, 1951  Procedure: Electrical Cardioversion Indications:  Persistent atrial fibrillation Procedure Details Consent: verified Time Out: Verified patient identification, verified procedure, site/side was marked, verified correct patient position, special equipment/implants available, medications/allergies/relevent history reviewed, required imaging and test results available.  time out performed:1044  Patient placed on cardiac monitor, pulse oximetry, supplemental oxygen as necessary.  Sedation given: yes Pacer pads placed yes Cardioverted 1 time(s).  Cardioverted at 120 joules Evaluation Findings: Post procedure EKG shows: normal sinus rhythm Complications: none Patient did tolerate procedure well.   [name] 03/20/2016, 11:46 AM

## 2016-03-20 NOTE — Progress Notes (Addendum)
*  PRELIMINARY RESULTS* Echocardiogram T.E.E. has been performed.  Daisy Black 03/20/2016, 11:35 AM

## 2016-03-21 ENCOUNTER — Telehealth: Payer: Self-pay | Admitting: Cardiology

## 2016-03-21 NOTE — Telephone Encounter (Signed)
She needs to have a follow-up ECG to see if she has gone back into atrial fibrillation. Please arrange nurse visit tomorrow.

## 2016-03-21 NOTE — Telephone Encounter (Signed)
Patient reports feeling fluttering in chest on and off all day today. BP 102/56 HR 83 Denies chest pain, SOB, and nausea. Please advise.

## 2016-03-21 NOTE — Telephone Encounter (Signed)
Pt made aware to be here tomorrow @ 4:15 for an ECG.

## 2016-03-21 NOTE — Telephone Encounter (Signed)
Patient states that she had Cardioversion yesterday and her heart has been fluttering all day. / tg

## 2016-03-22 ENCOUNTER — Ambulatory Visit (INDEPENDENT_AMBULATORY_CARE_PROVIDER_SITE_OTHER): Payer: Medicare Other

## 2016-03-22 ENCOUNTER — Encounter
Admission: RE | Admit: 2016-03-22 | Discharge: 2016-03-22 | Disposition: A | Discharge status: Home / Self Care | Payer: Medicare Other | Source: Ambulatory Visit | Attending: Oncology | Admitting: Oncology

## 2016-03-22 VITALS — Ht 67.5 in | Wt 234.0 lb

## 2016-03-22 DIAGNOSIS — I480 Paroxysmal atrial fibrillation: Secondary | ICD-10-CM | POA: Diagnosis not present

## 2016-03-22 DIAGNOSIS — I4891 Unspecified atrial fibrillation: Secondary | ICD-10-CM | POA: Diagnosis not present

## 2016-03-22 DIAGNOSIS — C9110 Chronic lymphocytic leukemia of B-cell type not having achieved remission: Secondary | ICD-10-CM | POA: Insufficient documentation

## 2016-03-22 LAB — GLUCOSE, CAPILLARY: GLUCOSE-CAPILLARY: 86 mg/dL (ref 65–99)

## 2016-03-22 MED ORDER — FLUDEOXYGLUCOSE F - 18 (FDG) INJECTION
12.4100 | Freq: Once | INTRAVENOUS | Status: AC
  Administered 2016-03-22: 12.41 via INTRAVENOUS

## 2016-03-22 NOTE — Patient Instructions (Addendum)
Pt stated she had a bad day yesterday, but today has been fine. EKG done and was found to be in sinus rhythm. Pt reminded of 9/6 appt.

## 2016-03-23 ENCOUNTER — Encounter (HOSPITAL_COMMUNITY): Payer: Self-pay | Admitting: Cardiology

## 2016-03-26 ENCOUNTER — Other Ambulatory Visit: Payer: Self-pay | Admitting: *Deleted

## 2016-03-26 DIAGNOSIS — E041 Nontoxic single thyroid nodule: Secondary | ICD-10-CM

## 2016-03-28 ENCOUNTER — Ambulatory Visit: Payer: Medicare Other | Admitting: Gastroenterology

## 2016-04-03 ENCOUNTER — Telehealth: Payer: Self-pay | Admitting: *Deleted

## 2016-04-03 ENCOUNTER — Encounter: Payer: Self-pay | Admitting: Cardiology

## 2016-04-03 NOTE — Progress Notes (Signed)
Cardiology Office Note  Date: 04/04/2016   ID: Daisy Black, Daisy Black 09-14-1950, MRN [id number]  PCP: Renee Rival, NP  Primary Cardiologist: Rozann Lesches, MD   Chief Complaint  Patient presents with  . PAF    History of Present Illness: Daisy Black is a 65 y.o. female last seen in August. She was referred for a TEE guided cardioversion with history of previously documented left atrial appendage thrombus and persistent symptomatic atrial fibrillation. Procedure was performed successfully on August 22. At that time there was no clear evidence of residual left atrial appendage thrombus with prominent trabeculation present near the appendage tip. She was successfully cardioverted to sinus rhythm. She did call the office with some sense of palpitations subsequent to this, however follow-up ECG showed sinus rhythm.  She presents today with her daughter for a follow-up visit. She has had improvement in prior symptoms of palpitations and weakness. We reviewed her medications and discussed stopping Lanoxin, planning to continue Lopressor and Eliquis.   Past Medical History:  Diagnosis Date  . Arthritis   . Atrial fibrillation ([place])    Atrial fibrillation - onset in 09/2009; DC cardioversion in 10/2009  . CLL (chronic lymphocytic leukemia) ([place])   . Essential hypertension   . GERD (gastroesophageal reflux disease)   . History of hiatal hernia   . Hyperlipidemia   . Lymphocytosis   . Obesity   . Spleen enlarged   . TSH elevation     Current Outpatient Prescriptions  Medication Sig Dispense Refill  . acetaminophen (TYLENOL) 650 MG CR tablet Take 650 mg by mouth every 8 (eight) hours as needed for pain.     Marland Kitchen apixaban (ELIQUIS) 5 MG TABS tablet Take 1 tablet (5 mg total) by mouth 2 (two) times daily. 60 tablet 2  . digoxin (LANOXIN) 0.25 MG tablet Take 1 tablet (0.25 mg total) by mouth daily. 30 tablet 2  . Ferrous Fumarate (IRON) 18 MG TBCR Take 1 tablet by mouth daily.      Marland Kitchen levothyroxine (SYNTHROID, LEVOTHROID) 125 MCG tablet Take 125 mcg by mouth daily before breakfast.    . Magnesium Oxide 200 MG TABS Take 1 tablet (200 mg total) by mouth daily. 30 tablet 6  . metoprolol tartrate (LOPRESSOR) 25 MG tablet Take 1 tablet (25 mg total) by mouth 2 (two) times daily. 60 tablet 2  . pantoprazole (PROTONIX) 40 MG tablet Take 1 tablet (40 mg total) by mouth daily before breakfast. 30 tablet 5  . Probiotic Product (PROBIOTIC PO) Take 1 capsule by mouth daily.    . promethazine (PHENERGAN) 12.5 MG tablet Take 1 tablet (12.5 mg total) by mouth every 6 (six) hours as needed for nausea or vomiting. 30 tablet 0   No current facility-administered medications for this visit.    Facility-Administered Medications Ordered in Other Visits  Medication Dose Route Frequency Provider Last Rate Last Dose  . hydrocortisone cream 1 % 1 application  1 application Topical TID PRN Satira Sark, MD       Allergies:  Review of patient's allergies indicates no known allergies.   Social History: The patient  reports that she quit smoking about 40 years ago. Her smoking use included Cigarettes. She has a 0.50 pack-year smoking history. She has never used smokeless tobacco. She reports that she does not drink alcohol or use drugs.   ROS:  Please see the history of present illness. Otherwise, complete review of systems is positive for none.  All other  systems are reviewed and negative.   Physical Exam: VS:  BP 118/60   Pulse 68   Ht 5\' 7"  (1.702 m)   Wt 226 lb (102.5 kg)   SpO2 98%   BMI 35.40 kg/m , BMI Body mass index is 35.4 kg/m.  Wt Readings from Last 3 Encounters:  04/04/16 226 lb (102.5 kg)  03/22/16 234 lb (106.1 kg)  03/20/16 234 lb (106.1 kg)    General: Patient appears comfortable at rest. HEENT: Conjunctiva and lids normal, oropharynx clear with moist mucosa. Neck: Supple, no elevated JVP or carotid bruits, no thyromegaly. Lungs: Clear to auscultation, nonlabored  breathing at rest. Cardiac: Regular rate and rhythm, no S3 or significant systolic murmur, no pericardial rub. Abdomen: Soft, nontender, no hepatomegaly, bowel sounds present, no guarding or rebound. Extremities: No pitting edema, distal pulses 2+.  ECG: I personally reviewed the tracing from 03/22/2016 which showed normal sinus rhythm with inferior Q waves.  Recent Labwork: 01/12/2016: Magnesium 2.2; TSH 1.986 03/08/2016: ALT 13; AST 21 03/16/2016: BUN 14; Creatinine, Ser 0.74; Hemoglobin 12.5; Platelets 100; Potassium 3.5; Sodium 140   Other Studies Reviewed Today:  TEE 03/20/2016: Study Conclusions  - Left ventricle: Systolic function was normal. The estimated   ejection fraction was in the range of 60% to 65%. Wall motion was   normal; there were no regional wall motion abnormalities. No   evidence of thrombus. - Descending aorta: The descending aorta had minor luminal   irregularities. - Mitral valve: There was mild regurgitation. - Left atrium: The atrium was mildly dilated. No evidence of   thrombus in the atrial cavity or appendage. Prominent   trabeculation visualized at tip of appendage. Emptying velocity   was moderately reduced. - Right atrium: No evidence of thrombus in the atrial cavity or   appendage. - Tricuspid valve: There was mild regurgitation. - Pericardium, extracardiac: There was no pericardial effusion.  Impressions:  - LVEF 60-65% without wall motion abnormalities. No left atrial   appendage thrombus visualized. There is prominent trabeculation   noted at the appendage tip. Emptying velocity was moderately   reduced. No right atrial appendage thrombus noted. Mild tricuspid   regurgitation.  Assessment and Plan:  1. Maintaining sinus rhythm status post TEE guided cardioversion for symptomatic persistent atrial fibrillation. We plan to stop Lanoxin, continue Lopressor and Eliquis.  2. Essential hypertension, blood pressure is well controlled  today.  Current medicines were reviewed with the patient today.  Disposition: Follow-up with me in 3 months.  Signed, Satira Sark, MD, [facility] 04/04/2016 1:26 PM    [facility] [address], [place], [place] [address] Phone: [phone]; Fax: [phone] Dictation #1 [id number]  [id number]

## 2016-04-03 NOTE — Telephone Encounter (Signed)
Needs a prescription for wheelchair and asking about assistance for Rituxan. I checked with Velna Hatchet and she said she sees no treatmetn plan for Rituxan, but she has MCR A & B and willnot need Pre auth, she has not met her deductible for the year so there will be a cost to her. SHe will have Elease Etienne MSW contact her if there is going to be a big co payment. She asks that prescription for W/C be faxed to her at [phone]

## 2016-04-03 NOTE — Telephone Encounter (Signed)
Done

## 2016-04-04 ENCOUNTER — Encounter: Payer: Self-pay | Admitting: Cardiology

## 2016-04-04 ENCOUNTER — Ambulatory Visit: Payer: Medicare Other

## 2016-04-04 ENCOUNTER — Other Ambulatory Visit: Payer: Self-pay | Admitting: Oncology

## 2016-04-04 ENCOUNTER — Ambulatory Visit (INDEPENDENT_AMBULATORY_CARE_PROVIDER_SITE_OTHER): Payer: Medicare Other | Admitting: Cardiology

## 2016-04-04 VITALS — BP 118/60 | HR 68 | Ht 67.0 in | Wt 226.0 lb

## 2016-04-04 DIAGNOSIS — I10 Essential (primary) hypertension: Secondary | ICD-10-CM

## 2016-04-04 DIAGNOSIS — C9110 Chronic lymphocytic leukemia of B-cell type not having achieved remission: Secondary | ICD-10-CM

## 2016-04-04 DIAGNOSIS — I480 Paroxysmal atrial fibrillation: Secondary | ICD-10-CM | POA: Diagnosis not present

## 2016-04-04 NOTE — Telephone Encounter (Signed)
Treatment plan in place.

## 2016-04-04 NOTE — Patient Instructions (Signed)
Medication Instructions:  STOP LANOXIN STOP MAGNESIUM   Labwork: NONE  Testing/Procedures: NONE  Follow-Up: Your physician recommends that you schedule a follow-up appointment in: 3 MONTHS    Any Other Special Instructions Will Be Listed Below (If Applicable).     If you need a refill on your cardiac medications before your next appointment, please call your pharmacy.

## 2016-04-05 ENCOUNTER — Ambulatory Visit
Admission: RE | Admit: 2016-04-05 | Discharge: 2016-04-05 | Disposition: A | Discharge status: Home / Self Care | Payer: Medicare Other | Source: Ambulatory Visit | Attending: Oncology | Admitting: Oncology

## 2016-04-05 ENCOUNTER — Telehealth: Payer: Self-pay | Admitting: Cardiology

## 2016-04-05 DIAGNOSIS — E041 Nontoxic single thyroid nodule: Secondary | ICD-10-CM | POA: Diagnosis present

## 2016-04-05 DIAGNOSIS — E042 Nontoxic multinodular goiter: Secondary | ICD-10-CM | POA: Diagnosis not present

## 2016-04-05 NOTE — Telephone Encounter (Signed)
Rx sent to pt

## 2016-04-07 ENCOUNTER — Telehealth: Payer: Self-pay | Admitting: Cardiology

## 2016-04-07 NOTE — Telephone Encounter (Signed)
Called by pt's son. Pt's HR in the 80s and irregular after an extra Lopressor, probably back in AF. She is on Eliquis. I would get the pt over to the office for an EKG Monday and discuss with Dr Domenic Polite if he wants to start Amiodarone and repeat DCCV.  Kerin Ransom PA-C 04/07/2016 1:02 PM

## 2016-04-09 ENCOUNTER — Ambulatory Visit (INDEPENDENT_AMBULATORY_CARE_PROVIDER_SITE_OTHER): Payer: Medicare Other | Admitting: Cardiology

## 2016-04-09 ENCOUNTER — Encounter: Payer: Self-pay | Admitting: Cardiology

## 2016-04-09 VITALS — BP 122/72 | HR 120 | Ht 67.5 in | Wt 222.0 lb

## 2016-04-09 DIAGNOSIS — I480 Paroxysmal atrial fibrillation: Secondary | ICD-10-CM

## 2016-04-09 MED ORDER — DILTIAZEM HCL 60 MG PO TABS: 60.0000 mg | ORAL_TABLET | Freq: Four times a day (QID) | ORAL | 3 refills | Status: DC

## 2016-04-09 MED ORDER — DILTIAZEM HCL 60 MG PO TABS: 60.0000 mg | ORAL_TABLET | Freq: Two times a day (BID) | ORAL | 3 refills | Status: DC

## 2016-04-09 NOTE — Progress Notes (Signed)
Clinical Summary Daisy Black is a 65 y.o.female regular patient of Dr Domenic Polite, she is seen as an add on patient today for irregular heart beat.  1. PAF - s/p succesful DCCV in Mar 20, 2016.  - digoxin and diltaizem recently stopped, has been on lopressor and eliquis.  - from notes even after cardioversion she reported some occasional fluttering, a f/u EKG showed she remained in NSR. - she had controlled heart rates on rate control strategy, but had low pressures and fatigue.  - recent palpitations started Saturday AM, constant since that time.      Past Medical History:  Diagnosis Date  . Arthritis   . Atrial fibrillation ([place])    Atrial fibrillation - onset in 09/2009; DC cardioversion in 10/2009  . CLL (chronic lymphocytic leukemia) ([place])   . Essential hypertension   . GERD (gastroesophageal reflux disease)   . History of hiatal hernia   . Hyperlipidemia   . Lymphocytosis   . Obesity   . Spleen enlarged   . TSH elevation      No Known Allergies   Current Outpatient Prescriptions  Medication Sig Dispense Refill  . acetaminophen (TYLENOL) 650 MG CR tablet Take 650 mg by mouth every 8 (eight) hours as needed for pain.     Marland Kitchen apixaban (ELIQUIS) 5 MG TABS tablet Take 1 tablet (5 mg total) by mouth 2 (two) times daily. 60 tablet 2  . Ferrous Fumarate (IRON) 18 MG TBCR Take 1 tablet by mouth daily.     Marland Kitchen levothyroxine (SYNTHROID, LEVOTHROID) 125 MCG tablet Take 125 mcg by mouth daily before breakfast.    . metoprolol tartrate (LOPRESSOR) 25 MG tablet Take 1 tablet (25 mg total) by mouth 2 (two) times daily. 60 tablet 2  . pantoprazole (PROTONIX) 40 MG tablet Take 1 tablet (40 mg total) by mouth daily before breakfast. 30 tablet 5  . Probiotic Product (PROBIOTIC PO) Take 1 capsule by mouth daily.    . promethazine (PHENERGAN) 12.5 MG tablet Take 1 tablet (12.5 mg total) by mouth every 6 (six) hours as needed for nausea or vomiting. 30 tablet 0   No current  facility-administered medications for this visit.    Facility-Administered Medications Ordered in Other Visits  Medication Dose Route Frequency Provider Last Rate Last Dose  . hydrocortisone cream 1 % 1 application  1 application Topical TID PRN Satira Sark, MD         Past Surgical History:  Procedure Laterality Date  . ANKLE SURGERY Right   . CARDIOVERSION N/A 03/20/2016   Procedure: CARDIOVERSION;  Surgeon: Satira Sark, MD;  Location: [facility];  Service: Cardiovascular;  Laterality: N/A;  . CHOLECYSTECTOMY    . HERNIA REPAIR  2011   Incisional and umbilical utilizing mesh  . ROTATOR CUFF REPAIR    . TEE WITHOUT CARDIOVERSION N/A 01/17/2016   Procedure: TRANSESOPHAGEAL ECHOCARDIOGRAM (TEE);  Surgeon: Herminio Commons, MD;  Location: [facility];  Service: Cardiovascular;  Laterality: N/A;  . TEE WITHOUT CARDIOVERSION N/A 02/21/2016   Procedure: TRANSESOPHAGEAL ECHOCARDIOGRAM (TEE) WITH PROPOFOL;  Surgeon: Herminio Commons, MD;  Location: [facility];  Service: Cardiovascular;  Laterality: N/A;  . TEE WITHOUT CARDIOVERSION N/A 03/20/2016   Procedure: TRANSESOPHAGEAL ECHOCARDIOGRAM (TEE) WITH PROPOFOL;  Surgeon: Satira Sark, MD;  Location: [facility];  Service: Cardiovascular;  Laterality: N/A;  . TONSILLECTOMY       No Known Allergies    Family History  Problem Relation Age of Onset  .  Ovarian cancer Mother 5    Secondary to ovarian cancer  . Leukemia Mother   . Stroke Father 23    Brain stem infarction  . Breast cancer Paternal Aunt      Social History Daisy Black reports that she quit smoking about 40 years ago. Her smoking use included Cigarettes. She has a 0.50 pack-year smoking history. She has never used smokeless tobacco. Daisy Black reports that she does not drink alcohol.   Review of Systems CONSTITUTIONAL: No weight loss, fever, chills, weakness or fatigue.  HEENT: Eyes: No visual loss, blurred vision, double vision or yellow sclerae.No hearing loss,  sneezing, congestion, runny nose or sore throat.  SKIN: No rash or itching.  CARDIOVASCULAR: per HPI RESPIRATORY: No shortness of breath, cough or sputum.  GASTROINTESTINAL: No anorexia, nausea, vomiting or diarrhea. No abdominal pain or blood.  GENITOURINARY: No burning on urination, no polyuria NEUROLOGICAL: No headache, dizziness, syncope, paralysis, ataxia, numbness or tingling in the extremities. No change in bowel or bladder control.  MUSCULOSKELETAL: No muscle, back pain, joint pain or stiffness.  LYMPHATICS: No enlarged nodes. No history of splenectomy.  PSYCHIATRIC: No history of depression or anxiety.  ENDOCRINOLOGIC: No reports of sweating, cold or heat intolerance. No polyuria or polydipsia.  Marland Kitchen   Physical Examination Vitals:   04/09/16 1326  BP: 122/72  Pulse: (!) 120   Vitals:   04/09/16 1326  Weight: 222 lb (100.7 kg)  Height: 5' 7.5" (1.715 m)    Gen: resting comfortably, no acute distress HEENT: no scleral icterus, pupils equal round and reactive, no palptable cervical adenopathy,  CV: irreg, tachy 130, no m/rg Resp: Clear to auscultation bilaterally GI: abdomen is soft, non-tender, non-distended, normal bowel sounds, no hepatosplenomegaly MSK: extremities are warm, no edema.  Skin: warm, no rash Neuro:  no focal deficits Psych: appropriate affect      Assessment and Plan  1. Afib - ekg in clinic shows patient is back in afib with rvr, s/p cardioversion 03/20/16 - short term we will restart cardizem 60mg  bid, she had controlled rates with this regimen however did have some fatigue. I will touch base with Dr Domenic Polite to see what his preference is regarding possible antiarrhythmic therapy. Continue eliquis, CHADS2Vasc score is 3. - she is scheduled to intitiate rituxan therapy at the cancer center tomorrow, with her elevated rates I will message Dr Shari Heritage to see if we can postpone therapy for a week or so until heart rates better controlled.     F/u Dr  Domenic Polite 1 week.     Arnoldo Lenis, M.D.

## 2016-04-09 NOTE — Patient Instructions (Signed)
Medication Instructions:  Start Diltiazem 60 mg two times daily   Labwork: none  Testing/Procedures: none  Follow-Up: Your physician recommends that you schedule a follow-up appointment in: next week with Dr. Domenic Polite    Any Other Special Instructions Will Be Listed Below (If Applicable).  I have given you a prescription for Eliquis ( 1 year )    If you need a refill on your cardiac medications before your next appointment, please call your pharmacy.

## 2016-04-10 ENCOUNTER — Inpatient Hospital Stay: Payer: Medicare Other

## 2016-04-10 ENCOUNTER — Telehealth: Payer: Self-pay

## 2016-04-10 ENCOUNTER — Inpatient Hospital Stay: Payer: Medicare Other | Admitting: Oncology

## 2016-04-10 ENCOUNTER — Other Ambulatory Visit: Payer: Self-pay

## 2016-04-10 DIAGNOSIS — I480 Paroxysmal atrial fibrillation: Secondary | ICD-10-CM

## 2016-04-10 MED ORDER — DILTIAZEM HCL 60 MG PO TABS: 60.0000 mg | ORAL_TABLET | Freq: Two times a day (BID) | ORAL | 3 refills | Status: DC

## 2016-04-10 NOTE — Telephone Encounter (Signed)
-----   Message from Satira Sark, MD sent at 04/09/2016  6:25 PM EDT ----- Thanks for seeing her Roderic Palau. I just saw her last week and she was doing well. Since back in AF so soon, would refer her to EP for discussion regarding antiarrhythmic therapy versus ablation. She was fairly symptomatic with AF and had some difficulty with rate control strategy.  ----- Message ----- From: Arnoldo Lenis, MD Sent: 04/09/2016   2:06 PM To: Satira Sark, MD, Drema Dallas, CMA  Sam, I saw one of your patients in clinic today. You had cardioverted her afib back in August. Looks like she did ok for a few weeks but back in afib with RVR today. I restarted her dilt short term but was going to defer to you for her longer term strategy. I did discuss with them in general terms options including rate control, antiarrhythmics, and ablation. Please let Lattie Haw know if you have any additional instructions for her  [employer]

## 2016-04-10 NOTE — Telephone Encounter (Signed)
Put order in for EP, got with Coralyn Mark to cancel her follow- up with Dr. Domenic Polite that was scheduled for next week.

## 2016-04-10 NOTE — Telephone Encounter (Signed)
Courtesy call for mailing of FMLA on behalf of [employer]:  Day 1: Spoke with patient. She aware letter was sent out yesterday. She will be waiting on the authorization form that is mailed to her. 04/10/16 ab

## 2016-04-12 ENCOUNTER — Telehealth: Payer: Self-pay | Admitting: Cardiology

## 2016-04-12 NOTE — Telephone Encounter (Signed)
Pt increased diltiazem 2 days ago  BP just now was 104/64, HR 88 , HR this am was 99 but did not remember her BP  All she reports is HR ranges from 85 - 100, has not taken BP's except the one today

## 2016-04-12 NOTE — Telephone Encounter (Signed)
Daughter called to say mother is uncomfortable with HR at 100,can't sleep.daughter states her highest HR is 120,BP is "normal" per daughter,not with mother at this time.  The patient has now self increased her diltiazem 60 mg to three times a day vs prescribed twice a day by MD    They want to know what else can she take to lower her HR

## 2016-04-12 NOTE — Telephone Encounter (Signed)
When did she increase her diltiazem to 60mg  tid, and what have her HR's and bp's been since that increase. We have room to increase either her dilt or  lopressor as well but since she has had a complex medication history and Dr Domenic Polite knows her best I will defer to him, and will forward this message. Please get the updated HR's and bp's since she started dilt 60mg  tid.   Zandra Abts MD

## 2016-04-13 NOTE — Telephone Encounter (Signed)
If symptomatic with heart rates 80-100s, over the weekend she can try increasing her lopressor to 37.5mg  bid (would not change the prescription just yet) and update [place] on Monday of her symptoms.    J Keandre Linden MD

## 2016-04-13 NOTE — Telephone Encounter (Signed)
I spoke with daughter and they will call [address] to set up apt

## 2016-04-13 NOTE — Telephone Encounter (Signed)
I am not sure that any adjustment in her current medication is going to resolve her symptoms. We know that she was symptomatic with atrial fibrillation on similar medications previously, even when HR and BP were controlled. This makes it more important to make sure that she is seen by EP soon to help craft a plan for antiarrhythmic therapy or possibly ablation since she failed cardioversion.

## 2016-04-16 ENCOUNTER — Other Ambulatory Visit: Payer: Medicare Other

## 2016-04-16 ENCOUNTER — Ambulatory Visit: Payer: Medicare Other

## 2016-04-16 ENCOUNTER — Ambulatory Visit: Payer: Medicare Other | Admitting: Oncology

## 2016-04-16 NOTE — Progress Notes (Signed)
[place]  [phone] [phone]  ID: Daisy Black OB: 11-Sep-1950  MR#: [id number]  [id number]  Patient Care Team: Renee Rival, NP as PCP - General (Nurse Practitioner) Aviva Signs, MD (General Surgery) Rico Junker, RN as Registered Nurse Theodore Demark, RN as Registered Nurse Satira Sark, MD as Consulting Physician (Cardiology) Daneil Dolin, MD as Consulting Physician (Gastroenterology)  CHIEF COMPLAINT: CLL  INTERVAL HISTORY: Patient returns to clinic today for further evaluation and initiation of single agent Rituxan. She continues to have increasing weakness and fatigue. Patient also has early satiety and increased nausea and vomiting.  She has no fevers, chills, or night sweats. She has no chest pain or shortness of breath. She denies any constipation or diarrhea. She has no melanoma or hematochezia. Patient feels generally terrible, but offers no further specific complaints.  REVIEW OF SYSTEMS:   Review of Systems  Constitutional: Positive for malaise/fatigue. Negative for diaphoresis, fever and weight loss.  Respiratory: Negative.  Negative for cough and shortness of breath.   Cardiovascular: Negative.  Negative for chest pain.  Gastrointestinal: Positive for abdominal pain, nausea and vomiting. Negative for blood in stool, constipation, diarrhea and melena.  Musculoskeletal: Negative.   Neurological: Positive for weakness.  Psychiatric/Behavioral: The patient is nervous/anxious.     As per HPI. Otherwise, a complete review of systems is negatve.  PAST MEDICAL HISTORY: Past Medical History:  Diagnosis Date  . Arthritis   . Atrial fibrillation ([place])    Atrial fibrillation - onset in 09/2009; DC cardioversion in 10/2009  . CLL (chronic lymphocytic leukemia) ([place])   . Essential hypertension   . GERD (gastroesophageal reflux disease)   . History of hiatal hernia   . Hyperlipidemia   . Lymphocytosis   .  Obesity   . Spleen enlarged   . TSH elevation     PAST SURGICAL HISTORY: Past Surgical History:  Procedure Laterality Date  . ANKLE SURGERY Right   . CARDIOVERSION N/A 03/20/2016   Procedure: CARDIOVERSION;  Surgeon: Satira Sark, MD;  Location: [facility];  Service: Cardiovascular;  Laterality: N/A;  . CHOLECYSTECTOMY    . HERNIA REPAIR  2011   Incisional and umbilical utilizing mesh  . ROTATOR CUFF REPAIR    . TEE WITHOUT CARDIOVERSION N/A 01/17/2016   Procedure: TRANSESOPHAGEAL ECHOCARDIOGRAM (TEE);  Surgeon: Herminio Commons, MD;  Location: [facility];  Service: Cardiovascular;  Laterality: N/A;  . TEE WITHOUT CARDIOVERSION N/A 02/21/2016   Procedure: TRANSESOPHAGEAL ECHOCARDIOGRAM (TEE) WITH PROPOFOL;  Surgeon: Herminio Commons, MD;  Location: [facility];  Service: Cardiovascular;  Laterality: N/A;  . TEE WITHOUT CARDIOVERSION N/A 03/20/2016   Procedure: TRANSESOPHAGEAL ECHOCARDIOGRAM (TEE) WITH PROPOFOL;  Surgeon: Satira Sark, MD;  Location: [facility];  Service: Cardiovascular;  Laterality: N/A;  . TONSILLECTOMY      FAMILY HISTORY Family History  Problem Relation Age of Onset  . Ovarian cancer Mother 59    Secondary to ovarian cancer  . Leukemia Mother   . Stroke Father 65    Brain stem infarction  . Breast cancer Paternal Aunt        ADVANCED DIRECTIVES:    HEALTH MAINTENANCE: Social History  Substance Use Topics  . Smoking status: Former Smoker    Packs/day: 0.25    Years: 2.00    Types: Cigarettes    Quit date: 08/03/1975  . Smokeless tobacco: Never Used  . Alcohol use No     Colonoscopy:  PAP:  Bone  density:  Lipid panel:  No Known Allergies  Current Outpatient Prescriptions  Medication Sig Dispense Refill  . acetaminophen (TYLENOL) 650 MG CR tablet Take 650 mg by mouth every 8 (eight) hours as needed for pain.     Marland Kitchen apixaban (ELIQUIS) 5 MG TABS tablet Take 1 tablet (5 mg total) by mouth 2 (two) times daily. 60 tablet 2  . diltiazem (CARDIZEM) 60  MG tablet Take 1 tablet (60 mg total) by mouth 3 (three) times daily.    . Ferrous Fumarate (IRON) 18 MG TBCR Take 1 tablet by mouth daily.     Marland Kitchen levothyroxine (SYNTHROID, LEVOTHROID) 125 MCG tablet Take 125 mcg by mouth daily before breakfast.    . metoprolol tartrate (LOPRESSOR) 25 MG tablet Take 1 tablet (25 mg total) by mouth 3 (three) times daily. 90 tablet 6  . pantoprazole (PROTONIX) 40 MG tablet Take 1 tablet (40 mg total) by mouth daily before breakfast. 30 tablet 5  . Probiotic Product (PROBIOTIC PO) Take 1 capsule by mouth daily.    . promethazine (PHENERGAN) 12.5 MG tablet Take 1 tablet (12.5 mg total) by mouth every 6 (six) hours as needed for nausea or vomiting. 30 tablet 0   No current facility-administered medications for this visit.    Facility-Administered Medications Ordered in Other Visits  Medication Dose Route Frequency Provider Last Rate Last Dose  . hydrocortisone cream 1 % 1 application  1 application Topical TID PRN Satira Sark, MD        OBJECTIVE: Vitals:   04/18/16 0920  BP: 104/73  Pulse: 96  Resp: 18  Temp: (!) 96.4 F (35.8 C)     Body mass index is 36 kg/m.    ECOG FS:0 - Asymptomatic  General: Well-developed, well-nourished, no acute distress. Eyes: Pink conjunctiva, anicteric sclera. HEENT: Minimally palpable cervical lymph nodes. Lungs: Clear to auscultation bilaterally. Heart: Regular rate and rhythm. No rubs, murmurs, or gallops. Abdomen: Soft, nontender, nondistended. Splenomegaly noted, normoactive bowel sounds. Musculoskeletal: No edema, cyanosis, or clubbing. Neuro: Alert, answering all questions appropriately. Cranial nerves grossly intact. Skin: No rashes or petechiae noted. Psych: Normal affect.  LAB RESULTS:  Lab Results  Component Value Date   NA 140 03/16/2016   K 3.5 03/16/2016   CL 116 (H) 03/16/2016   CO2 24 03/16/2016   GLUCOSE 114 (H) 03/16/2016   BUN 14 03/16/2016   CREATININE 0.74 03/16/2016   CALCIUM 8.9  03/16/2016   PROT 6.4 (L) 03/08/2016   ALBUMIN 4.0 03/08/2016   AST 21 03/08/2016   ALT 13 (L) 03/08/2016   ALKPHOS 88 03/08/2016   BILITOT 0.8 03/08/2016   GFRNONAA >60 03/16/2016   GFRAA >60 03/16/2016    Lab Results  Component Value Date   WBC 26.2 (H) 04/18/2016   NEUTROABS 3.4 04/18/2016   HGB 13.0 04/18/2016   HCT 38.6 04/18/2016   MCV 90.4 04/18/2016   PLT 100 (L) 04/18/2016     STUDIES: US Soft Tissue Head/neck  Result Date: 04/06/2016 CLINICAL DATA:  Abnormal PET-CT. Hyper metabolic isthmus nodule has enlarged. Hyper metabolic eft lobe nodule is new. A EXAM: THYROID ULTRASOUND TECHNIQUE: Ultrasound examination of the thyroid gland and adjacent soft tissues was performed. COMPARISON:  None. FINDINGS: Parenchymal Echotexture: Markedly heterogenous Estimated total number of nodules >/= 1 cm: 4 Number of spongiform nodules > 2 cm not described below (TR1): 0 Number of mixed cystic and solid nodules > 1.5 cm not described below ([place]): 0 _________________________________________________________ Isthmus: 0.5 cm Nodule #  1: Location: Right; Mid Size: 2.1 x 1.6 x 2.1 cm Composition: solid/almost completely solid (2) Echogenicity: isoechoic (1) Shape: not taller-than-wide (0) Margins: smooth (0) Echogenic foci: punctate echogenic foci (3) ACR TI-RADS total points: 6. ACR TI-RADS risk category: TR4 (4-6 points). ACR TI-RADS recommendations: **Given size (>1.5 cm) and appearance, fine needle aspiration of this moderately suspicious nodule should be considered based on TI-RADS criteria. _________________________________________________________ Right lobe: 4.7 x 2.0 x 1.4 cm Nodule # 2: Location: Right; Mid Size: 1.2 x 0.8 x 0.4 cm Composition: solid/almost completely solid (2) Echogenicity: hypoechoic (2) Shape: not taller-than-wide (0) Margins: smooth (0) Echogenic foci: none (0) ACR TI-RADS total points: 4. ACR TI-RADS risk category: TR4 (4-6 points). ACR TI-RADS recommendations: *Given size (1  - 1.5 cm) and appearance, a follow-up ultrasound in 1 year should be considered based on TI-RADS criteria. Other smaller nodules are noted. _________________________________________________________ Left lobe: 4.8 x 2.2 x 2.5 cm Nodule # 3: Location: Left; Inferior Size: 2.4 x 2.3 x 2.2 cm Composition: solid/almost completely solid (2) Echogenicity: isoechoic (1) Shape: taller-than-wide (3) Margins: smooth (0) Echogenic foci: none (0) ACR TI-RADS total points: 6. ACR TI-RADS risk category: TR4 (4-6 points). ACR TI-RADS recommendations: **Given size (>1.5 cm) and appearance, fine needle aspiration of this moderately suspicious nodule should be considered based on TI-RADS criteria. Tiny lower pole nodule. IMPRESSION: The isthmic and left lobe nodules meet criteria for fine needle aspiration biopsy. These were noted to be hypermetabolic on PET and correspond to the abnormalities. A 1.2 cm right lobe nodule requires annual follow-up. The above is in keeping with the ACR TI-RADS recommendations - J Am Coll Radiol 2017;14:587-595. Electronically Signed   By: Marybelle Killings M.D.   On: 04/06/2016 08:01   Nm Pet Image Restag (ps) Skull Base To Thigh  Result Date: 03/22/2016 CLINICAL DATA:  Subsequent Treatment strategy for CLL. Recent CT scan revealed likely progression of disease with worsening lymphadenopathy and splenomegaly. EXAM: NUCLEAR MEDICINE PET SKULL BASE TO THIGH TECHNIQUE: 12.4 mCi F-18 FDG was injected intravenously. Full-ring PET imaging was performed from the skull base to thigh after the radiotracer. CT data was obtained and used for attenuation correction and anatomic localization. FASTING BLOOD GLUCOSE:  Value: 86 mg/dl COMPARISON:  Abdomen and pelvis CT from 03/08/2016. Chest abdomen and pelvis CT from 10/16/2013. FINDINGS: NECK No hypermetabolic lymph nodes in the neck. Two hypermetabolic nodules are identified in the thyroid. One of these is in the inferior isthmus measuring about 17 mm. This is  slightly progressed compared to chest CT from 10/16/2013. Inferior left thyroid nodule is hypermetabolic and appears to be new since the previous chest CT in 2015. CHEST Bilateral borderline enlarged axillary lymph nodes are evident and are stable since 10/16/2013. 9 mm short axis left axillary lymph node was 8 mm on 10/16/2013. FDG uptake in these lymph nodes is at background soft tissue levels with SUV max = 1.9. 15 mm short axis inferior right axillary lymph node was 15 mm on 10/16/2013. This shows low level background uptake with SUV max = 1.5. 17 mm short axis subcarinal lymph node (image 95 series 3) has increased in size from 6 mm short axis on chest CT of 10/16/2013. This lesion shows low level FDG uptake, essentially at background mediastinal levels with SUV max = 2.6. Anterior right juxta diaphragmatic lymph node (image 118 series 3) measures 1.3 cm in short axis. This node was 1.1 cm on 03/08/2016 and a 0.5 cm on 10/16/2013. Uptake is at background soft  tissue levels. 1.0 cm Left juxta diaphragmatic lymph node seen on image 126 was 0.7 cm on 03/08/2016 and 0.5 cm on 10/16/2013. Uptake in this lymph node is at background soft tissue levels. Numerous scattered small and borderline enlarged lymph nodes are seen elsewhere through the mediastinum. No suspicious pulmonary nodule or mass. ABDOMEN/PELVIS No abnormal hypermetabolic activity within the liver, pancreas, or adrenal glands. Multiple enlarged lymph nodes are seen in the upper abdomen, as on the recent study from 03/08/2016. Hepatoduodenal ligament nodes are more difficult to measure given the lack of intravenous contrast on today's study, but portal caval lymph node measured today 17 mm short axis was 17 mm on 03/08/2016 and 9 mm on 10/16/2013. SUV max = 4.3 today. 15 mm short axis celiac axis lymph node demonstrates SUV max = 3.1 with activity below hepatic levels and essentially at background soft tissue. This lymph node was 15 mm on 03/08/2016 and 8  mm on 10/16/2013. 14 mm short axis right common femoral lymph node is hypermetabolic at background soft tissue levels ( SUV max = 2.8) and measured 14 mm on 03/08/2016 and 8 mm on 10/16/2013. The spleen is enlarged as before and shows diffuse uptake slightly higher than hepatic parenchyma. SKELETON No focal hypermetabolic activity to suggest skeletal metastasis. IMPRESSION: 1. Interval progression of lymphadenopathy in the mediastinum, abdomen, and pelvis since 10/16/2013. These lymph nodes show only very low level FDG accumulation, essentially at background soft tissue levels. 2. Splenomegaly with diffuse mildly increased FDG accumulation compared to liver parenchyma. 3. Two hypermetabolic nodules in the thyroid, 1 of which appears new since 10/16/2013. Thyroid ultrasound recommended to further evaluate. Electronically Signed   By: Misty Stanley M.D.   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  PLAN:    1. CLL: PET scan results from March 22, 2016 reviewed independently and reported as above with likely progression of disease with increasing lymphadenopathy as well as splenomegaly. Her white blood cell count is only mildly elevated above her baseline. Biopsy was not pursued given the difficulty of the procedure. We will consider biopsy in the future given the concern for possible transformation to a more aggressive type of lymphoma. Plan was to give weekly Rituxan 4, but patient had a mild reaction to Rituxan today and it was discontinued. She will return to clinic in 1 week for reconsideration of additional Rituxan. Patient will likely need to be heavily premedicated prior to treatment.  2. Early satiety, nausea, vomiting: Likely related to patient's massive splenomegaly. 3. Atrial fibrillation: Patient states she has possible cardioversion in the near future which she has been instructed to pursue in parallel with her oncology appointments.  Next 4. Iron deficiency anemia: Patient's hemoglobin is within normal limits. She last received IV Feraheme in May 2017.  5. PET positive thyroid lesions: Will consider ultrasound-guided biopsy of the near future.  Approximately 30 minutes was spent in discussion of which greater than 50% was consultation.  Patient expressed understanding and was in agreement with this plan. She also understands that She can call clinic at any time with any questions, concerns, or complaints.     Lloyd Huger, MD   04/18/2016 12:45 PM

## 2016-04-16 NOTE — Progress Notes (Signed)
Cardiology Office Note  Date: 04/17/2016   ID: Daisy Black, DOB 1950/12/16, MRN [id number]  PCP: Renee Rival, NP  Primary Cardiologist: Rozann Lesches, MD   Chief Complaint  Patient presents with  . Recurrent atrial fibrillation    History of Present Illness: Daisy Black is a 65 y.o. female that I saw on September 6 following recent TEE guided cardioversion with successful restoration of sinus rhythm following documentation of no definitive left atrial appendage thrombus on anticoagulation. She was doing well the last visit, was continued on Lopressor and Eliquis with discontinuation of Lanoxin.  She was then seen in the office as an add-on by Dr. Harl Bowie on September 11 with palpitations and documentation of recurrent atrial fibrillation.she was placed on Cardizem at that time, subsequent adjustments made based on telephone notes. She presents today to discuss the next step.  She is here with her daughter. States that she feels fatigued with the recurrent atrial fibrillation (knew exactly when it changed), resting heart rate is in the 90s now. I discussed her medications. She has been taking Cardizem 60 mg 3 times a day and Lopressor 25 mg twice a day. She continues on Eliquis.  Today we discussed making a referral to EP for discussion regarding antiarrhythmics versus ablation.  Past Medical History:  Diagnosis Date  . Arthritis   . Atrial fibrillation ([place])    Atrial fibrillation - onset in 09/2009; DC cardioversion in 10/2009  . CLL (chronic lymphocytic leukemia) ([place])   . Essential hypertension   . GERD (gastroesophageal reflux disease)   . History of hiatal hernia   . Hyperlipidemia   . Lymphocytosis   . Obesity   . Spleen enlarged   . TSH elevation     Past Surgical History:  Procedure Laterality Date  . ANKLE SURGERY Right   . CARDIOVERSION N/A 03/20/2016   Procedure: CARDIOVERSION;  Surgeon: Satira Sark, MD;  Location: [facility];  Service:  Cardiovascular;  Laterality: N/A;  . CHOLECYSTECTOMY    . HERNIA REPAIR  2011   Incisional and umbilical utilizing mesh  . ROTATOR CUFF REPAIR    . TEE WITHOUT CARDIOVERSION N/A 01/17/2016   Procedure: TRANSESOPHAGEAL ECHOCARDIOGRAM (TEE);  Surgeon: Herminio Commons, MD;  Location: [facility];  Service: Cardiovascular;  Laterality: N/A;  . TEE WITHOUT CARDIOVERSION N/A 02/21/2016   Procedure: TRANSESOPHAGEAL ECHOCARDIOGRAM (TEE) WITH PROPOFOL;  Surgeon: Herminio Commons, MD;  Location: [facility];  Service: Cardiovascular;  Laterality: N/A;  . TEE WITHOUT CARDIOVERSION N/A 03/20/2016   Procedure: TRANSESOPHAGEAL ECHOCARDIOGRAM (TEE) WITH PROPOFOL;  Surgeon: Satira Sark, MD;  Location: [facility];  Service: Cardiovascular;  Laterality: N/A;  . TONSILLECTOMY      Current Outpatient Prescriptions  Medication Sig Dispense Refill  . acetaminophen (TYLENOL) 650 MG CR tablet Take 650 mg by mouth every 8 (eight) hours as needed for pain.     Marland Kitchen apixaban (ELIQUIS) 5 MG TABS tablet Take 1 tablet (5 mg total) by mouth 2 (two) times daily. 60 tablet 2  . diltiazem (CARDIZEM) 60 MG tablet Take 1 tablet (60 mg total) by mouth 3 (three) times daily.    . Ferrous Fumarate (IRON) 18 MG TBCR Take 1 tablet by mouth daily.     Marland Kitchen levothyroxine (SYNTHROID, LEVOTHROID) 125 MCG tablet Take 125 mcg by mouth daily before breakfast.    . metoprolol tartrate (LOPRESSOR) 25 MG tablet Take 1 tablet (25 mg total) by mouth 3 (three) times daily. 90 tablet 6  .  pantoprazole (PROTONIX) 40 MG tablet Take 1 tablet (40 mg total) by mouth daily before breakfast. 30 tablet 5  . Probiotic Product (PROBIOTIC PO) Take 1 capsule by mouth daily.    . promethazine (PHENERGAN) 12.5 MG tablet Take 1 tablet (12.5 mg total) by mouth every 6 (six) hours as needed for nausea or vomiting. 30 tablet 0   No current facility-administered medications for this visit.    Facility-Administered Medications Ordered in Other Visits  Medication Dose  Route Frequency Provider Last Rate Last Dose  . hydrocortisone cream 1 % 1 application  1 application Topical TID PRN Satira Sark, MD       Allergies:  Review of patient's allergies indicates no known allergies.   Social History: The patient  reports that she quit smoking about 40 years ago. Her smoking use included Cigarettes. She has a 0.50 pack-year smoking history. She has never used smokeless tobacco. She reports that she does not drink alcohol or use drugs.   ROS:  Please see the history of present illness. Otherwise, complete review of systems is positive for none.  All other systems are reviewed and negative.   Physical Exam: VS:  BP 104/70   Pulse 96   Ht 5\' 7"  (1.702 m)   Wt 229 lb (103.9 kg)   SpO2 96%   BMI 35.87 kg/m , BMI Body mass index is 35.87 kg/m.  Wt Readings from Last 3 Encounters:  04/17/16 229 lb (103.9 kg)  04/09/16 222 lb (100.7 kg)  04/04/16 226 lb (102.5 kg)    General: Patient appears comfortable at rest. HEENT: Conjunctiva and lids normal, oropharynx clear with moist mucosa. Neck: Supple, no elevated JVP or carotid bruits, no thyromegaly. Lungs: Clear to auscultation, nonlabored breathing at rest. Cardiac: Irregularly irregular, no S3 or significant systolic murmur, no pericardial rub. Abdomen: Soft, nontender, bowel sounds present, no guarding or rebound. Extremities: No pitting edema, distal pulses 2+. Skin: Warm and dry. Musculoskeletal: No kyphosis. Neuropsychiatric: Alert and oriented x3, affect grossly appropriate.  ECG: I personally reviewed the tracing from 04/09/2016 which showed atrial fibrillation with nonspecific ST-T changes.  Recent Labwork: 01/12/2016: Magnesium 2.2; TSH 1.986 03/08/2016: ALT 13; AST 21 03/16/2016: BUN 14; Creatinine, Ser 0.74; Hemoglobin 12.5; Platelets 100; Potassium 3.5; Sodium 140   Other Studies Reviewed Today:  TEE 03/20/2016: Study Conclusions  - Left ventricle: Systolic function was normal. The  estimated ejection fraction was in the range of 60% to 65%. Wall motion was normal; there were no regional wall motion abnormalities. No evidence of thrombus. - Descending aorta: The descending aorta had minor luminal irregularities. - Mitral valve: There was mild regurgitation. - Left atrium: The atrium was mildly dilated. No evidence of thrombus in the atrial cavity or appendage. Prominent trabeculation visualized at tip of appendage. Emptying velocity was moderately reduced. - Right atrium: No evidence of thrombus in the atrial cavity or appendage. - Tricuspid valve: There was mild regurgitation. - Pericardium, extracardiac: There was no pericardial effusion.  Impressions:  - LVEF 60-65% without wall motion abnormalities. No left atrial appendage thrombus visualized. There is prominent trabeculation noted at the appendage tip. Emptying velocity was moderately reduced. No right atrial appendage thrombus noted. Mild tricuspid regurgitation.  Assessment and Plan:  1. Paroxysmal atrial fibrillation with recent recurrence. Patient is symptomatic when in atrial fibrillation and we have had difficulty with rate control strategy in light of medication intolerances. CHADSVASC score is 3 and she has a prior history of left atrial appendage thrombus with  resolution on Eliquis by TEE in August at the time of successful cardioversion. Plan is to try and increase Lopressor to 25 mg 3 times a day and continue her current Cardizem dosing. We will make a referral to EP to discuss possibility of antiarrhythmic therapy versus ablation.  2. Essential hypertension, blood pressure normal today.  3. History of CLL, followed by Oncology with plans to start Rituxan.  Current medicines were reviewed with the patient today.   Orders Placed This Encounter  Procedures  . Ambulatory referral to Cardiac Electrophysiology    Disposition: EP consultation and then follow-up with  me.  Signed, Satira Sark, MD, [facility] 04/17/2016 12:00 PM    [facility] [place], [place], [place] [address] Phone: [phone]; Fax: [phone]

## 2016-04-17 ENCOUNTER — Ambulatory Visit (INDEPENDENT_AMBULATORY_CARE_PROVIDER_SITE_OTHER): Payer: Medicare Other | Admitting: Cardiology

## 2016-04-17 ENCOUNTER — Encounter: Payer: Self-pay | Admitting: Cardiology

## 2016-04-17 VITALS — BP 104/70 | HR 96 | Ht 67.0 in | Wt 229.0 lb

## 2016-04-17 DIAGNOSIS — I481 Persistent atrial fibrillation: Secondary | ICD-10-CM | POA: Diagnosis not present

## 2016-04-17 DIAGNOSIS — I10 Essential (primary) hypertension: Secondary | ICD-10-CM

## 2016-04-17 DIAGNOSIS — C9110 Chronic lymphocytic leukemia of B-cell type not having achieved remission: Secondary | ICD-10-CM

## 2016-04-17 DIAGNOSIS — I4819 Other persistent atrial fibrillation: Secondary | ICD-10-CM

## 2016-04-17 MED ORDER — METOPROLOL TARTRATE 25 MG PO TABS: 25.0000 mg | ORAL_TABLET | Freq: Three times a day (TID) | ORAL | 6 refills | Status: DC

## 2016-04-17 MED ORDER — DILTIAZEM HCL 60 MG PO TABS: 60.0000 mg | ORAL_TABLET | Freq: Three times a day (TID) | ORAL | Status: DC

## 2016-04-17 NOTE — Patient Instructions (Signed)
Medication Instructions:   Continue the Diltiazem at three times per day, updated on medication list.  Increase Lopressor to 25mg  three times per day.  Continue all other medications.    Labwork: none  Testing/Procedures: None  Referrals:   EP - Electrophysiology - [facility] [place]).    Follow-Up: 6 weeks   Any Other Special Instructions Will Be Listed Below (If Applicable).  If you need a refill on your cardiac medications before your next appointment, please call your pharmacy.

## 2016-04-18 ENCOUNTER — Inpatient Hospital Stay: Payer: Medicare Other

## 2016-04-18 ENCOUNTER — Inpatient Hospital Stay: Payer: Medicare Other | Attending: Oncology | Admitting: Oncology

## 2016-04-18 ENCOUNTER — Other Ambulatory Visit: Payer: Self-pay

## 2016-04-18 VITALS — BP 108/67 | HR 80 | Temp 97.0°F | Resp 20

## 2016-04-18 VITALS — BP 104/73 | HR 96 | Temp 96.4°F | Resp 18 | Wt 229.8 lb

## 2016-04-18 DIAGNOSIS — F419 Anxiety disorder, unspecified: Secondary | ICD-10-CM | POA: Diagnosis not present

## 2016-04-18 DIAGNOSIS — C9110 Chronic lymphocytic leukemia of B-cell type not having achieved remission: Secondary | ICD-10-CM | POA: Insufficient documentation

## 2016-04-18 DIAGNOSIS — R531 Weakness: Secondary | ICD-10-CM | POA: Diagnosis not present

## 2016-04-18 DIAGNOSIS — D509 Iron deficiency anemia, unspecified: Secondary | ICD-10-CM | POA: Insufficient documentation

## 2016-04-18 DIAGNOSIS — Z8041 Family history of malignant neoplasm of ovary: Secondary | ICD-10-CM | POA: Insufficient documentation

## 2016-04-18 DIAGNOSIS — R5383 Other fatigue: Secondary | ICD-10-CM | POA: Insufficient documentation

## 2016-04-18 DIAGNOSIS — Z803 Family history of malignant neoplasm of breast: Secondary | ICD-10-CM | POA: Insufficient documentation

## 2016-04-18 DIAGNOSIS — Z5111 Encounter for antineoplastic chemotherapy: Secondary | ICD-10-CM | POA: Diagnosis present

## 2016-04-18 DIAGNOSIS — Z87891 Personal history of nicotine dependence: Secondary | ICD-10-CM

## 2016-04-18 DIAGNOSIS — R6881 Early satiety: Secondary | ICD-10-CM | POA: Insufficient documentation

## 2016-04-18 DIAGNOSIS — I10 Essential (primary) hypertension: Secondary | ICD-10-CM | POA: Insufficient documentation

## 2016-04-18 DIAGNOSIS — I4891 Unspecified atrial fibrillation: Secondary | ICD-10-CM | POA: Insufficient documentation

## 2016-04-18 DIAGNOSIS — M1990 Unspecified osteoarthritis, unspecified site: Secondary | ICD-10-CM | POA: Insufficient documentation

## 2016-04-18 DIAGNOSIS — E0789 Other specified disorders of thyroid: Secondary | ICD-10-CM

## 2016-04-18 DIAGNOSIS — Z79899 Other long term (current) drug therapy: Secondary | ICD-10-CM

## 2016-04-18 DIAGNOSIS — R161 Splenomegaly, not elsewhere classified: Secondary | ICD-10-CM | POA: Diagnosis not present

## 2016-04-18 DIAGNOSIS — R112 Nausea with vomiting, unspecified: Secondary | ICD-10-CM | POA: Diagnosis not present

## 2016-04-18 DIAGNOSIS — R109 Unspecified abdominal pain: Secondary | ICD-10-CM | POA: Diagnosis not present

## 2016-04-18 DIAGNOSIS — E785 Hyperlipidemia, unspecified: Secondary | ICD-10-CM | POA: Diagnosis not present

## 2016-04-18 DIAGNOSIS — Z806 Family history of leukemia: Secondary | ICD-10-CM | POA: Diagnosis not present

## 2016-04-18 DIAGNOSIS — Z7901 Long term (current) use of anticoagulants: Secondary | ICD-10-CM | POA: Insufficient documentation

## 2016-04-18 DIAGNOSIS — K219 Gastro-esophageal reflux disease without esophagitis: Secondary | ICD-10-CM | POA: Insufficient documentation

## 2016-04-18 DIAGNOSIS — R5381 Other malaise: Secondary | ICD-10-CM | POA: Diagnosis not present

## 2016-04-18 LAB — CBC WITH DIFFERENTIAL/PLATELET
BASOS ABS: 0.1 10*3/uL (ref 0–0.1)
BASOS PCT: 0 %
EOS ABS: 0.2 10*3/uL (ref 0–0.7)
EOS PCT: 1 %
HCT: 38.6 % (ref 35.0–47.0)
HEMOGLOBIN: 13 g/dL (ref 12.0–16.0)
Lymphocytes Relative: 84 %
Lymphs Abs: 22 10*3/uL — ABNORMAL HIGH (ref 1.0–3.6)
MCH: 30.3 pg (ref 26.0–34.0)
MCHC: 33.6 g/dL (ref 32.0–36.0)
MCV: 90.4 fL (ref 80.0–100.0)
Monocytes Absolute: 0.5 10*3/uL (ref 0.2–0.9)
Monocytes Relative: 2 %
NEUTROS PCT: 13 %
Neutro Abs: 3.4 10*3/uL (ref 1.4–6.5)
PLATELETS: 100 10*3/uL — AB (ref 150–440)
RBC: 4.27 MIL/uL (ref 3.80–5.20)
RDW: 15.8 % — ABNORMAL HIGH (ref 11.5–14.5)
WBC: 26.2 10*3/uL — AB (ref 3.6–11.0)

## 2016-04-18 MED ORDER — SODIUM CHLORIDE 0.9 % IV SOLN
375.0000 mg/m2 | Freq: Once | INTRAVENOUS | Status: AC
  Administered 2016-04-18: 800 mg via INTRAVENOUS
  Filled 2016-04-18: qty 50

## 2016-04-18 MED ORDER — SODIUM CHLORIDE 0.9 % IV SOLN
Freq: Once | INTRAVENOUS | Status: AC
  Administered 2016-04-18: 10:00:00 via INTRAVENOUS
  Filled 2016-04-18: qty 1000

## 2016-04-18 MED ORDER — DIPHENHYDRAMINE HCL 25 MG PO CAPS
25.0000 mg | ORAL_CAPSULE | Freq: Once | ORAL | Status: AC
  Administered 2016-04-18: 25 mg via ORAL
  Filled 2016-04-18: qty 1

## 2016-04-18 MED ORDER — ACETAMINOPHEN 325 MG PO TABS
650.0000 mg | ORAL_TABLET | Freq: Once | ORAL | Status: AC
  Administered 2016-04-18: 650 mg via ORAL
  Filled 2016-04-18: qty 2

## 2016-04-18 MED ORDER — DEXAMETHASONE SODIUM PHOSPHATE 10 MG/ML IJ SOLN
10.0000 mg | Freq: Once | INTRAMUSCULAR | Status: AC
  Administered 2016-04-18: 10 mg via INTRAVENOUS
  Filled 2016-04-18: qty 1

## 2016-04-18 NOTE — Progress Notes (Signed)
Use treatment plan from 04/10/16 for today's treatment per MD. Daisy Black  Patient received benadryl and tylenol premeds.  11:20 - patient 10 minutes into Rituxan and reports flush feeling, heaviness in her chest.  Stopped medication, increased fluids, checked vitals, called Dr. Grayland Ormond who ordered 10 mg of Decadron IV and monitor patient.  See flowsheet for vital signs.  Administered Decadron at 11:25.   11:30 - patient feels nauseated and spits up a small amount.    11:35 - patient reports feeling better, nausea subsiding, feeling better per patient.  Continue to monitor patient.

## 2016-04-18 NOTE — Progress Notes (Signed)
States is feeling well today. Would like something for nausea prior to starting rituxan today.

## 2016-04-24 ENCOUNTER — Encounter: Payer: Self-pay | Admitting: Internal Medicine

## 2016-04-25 ENCOUNTER — Encounter: Payer: Self-pay | Admitting: Internal Medicine

## 2016-04-25 ENCOUNTER — Ambulatory Visit (INDEPENDENT_AMBULATORY_CARE_PROVIDER_SITE_OTHER): Payer: Medicare Other | Admitting: Internal Medicine

## 2016-04-25 VITALS — BP 116/60 | HR 111 | Ht 67.5 in | Wt 227.2 lb

## 2016-04-25 DIAGNOSIS — I10 Essential (primary) hypertension: Secondary | ICD-10-CM | POA: Diagnosis not present

## 2016-04-25 DIAGNOSIS — I481 Persistent atrial fibrillation: Secondary | ICD-10-CM | POA: Diagnosis not present

## 2016-04-25 DIAGNOSIS — C9110 Chronic lymphocytic leukemia of B-cell type not having achieved remission: Secondary | ICD-10-CM | POA: Diagnosis not present

## 2016-04-25 DIAGNOSIS — I480 Paroxysmal atrial fibrillation: Secondary | ICD-10-CM

## 2016-04-25 DIAGNOSIS — I4819 Other persistent atrial fibrillation: Secondary | ICD-10-CM

## 2016-04-25 MED ORDER — FLECAINIDE ACETATE 50 MG PO TABS: 50.0000 mg | ORAL_TABLET | Freq: Two times a day (BID) | ORAL | 3 refills | Status: DC

## 2016-04-25 MED ORDER — DILTIAZEM HCL 60 MG PO TABS: 60.0000 mg | ORAL_TABLET | Freq: Every day | ORAL | Status: DC | PRN

## 2016-04-25 MED ORDER — METOPROLOL TARTRATE 50 MG PO TABS: 50.0000 mg | ORAL_TABLET | Freq: Two times a day (BID) | ORAL | 3 refills | Status: DC

## 2016-04-25 NOTE — Progress Notes (Signed)
[place]  [phone] [phone]  ID: [name] OB: [birth year]  MR#: [id number]  [id number]  Patient Care Team: Renee Rival, NP as PCP - General (Nurse Practitioner) Aviva Signs, MD (General Surgery) Rico Junker, RN as Registered Nurse Theodore Demark, RN as Registered Nurse Satira Sark, MD as Consulting Physician (Cardiology) Daneil Dolin, MD as Consulting Physician (Gastroenterology)  CHIEF COMPLAINT: CLL  INTERVAL HISTORY: Patient returns to clinic today for further evaluation and to reinitiate single agent Rituxan. Treatment was stopped last week for concern of reaction.  She continues to have increasing weakness and fatigue. Patient also has early satiety and increased nausea and vomiting.  She has no fevers, chills, or night sweats. She has no chest pain or shortness of breath. She denies any constipation or diarrhea. She has no melanoma or hematochezia. Patient feels generally terrible, but offers no further specific complaints.  REVIEW OF SYSTEMS:   Review of Systems  Constitutional: Positive for malaise/fatigue. Negative for diaphoresis, fever and weight loss.  Respiratory: Negative.  Negative for cough and shortness of breath.   Cardiovascular: Negative.  Negative for chest pain.  Gastrointestinal: Positive for abdominal pain, nausea and vomiting. Negative for blood in stool, constipation, diarrhea and melena.  Musculoskeletal: Negative.   Neurological: Positive for weakness.  Psychiatric/Behavioral: The patient is nervous/anxious.     As per HPI. Otherwise, a complete review of systems is negative.  PAST MEDICAL HISTORY: Past Medical History:  Diagnosis Date  . Arthritis   . Atrial fibrillation ([place])    Atrial fibrillation - onset in 09/2009; DC cardioversion in 10/2009  . CLL (chronic lymphocytic leukemia) ([place])   . Essential hypertension   . GERD (gastroesophageal reflux disease)   . History of  hiatal hernia   . Hyperlipidemia   . Lymphocytosis   . Obesity   . Spleen enlarged   . TSH elevation     PAST SURGICAL HISTORY: Past Surgical History:  Procedure Laterality Date  . ANKLE SURGERY Right   . CARDIOVERSION N/A 03/20/2016   Procedure: CARDIOVERSION;  Surgeon: Satira Sark, MD;  Location: [facility];  Service: Cardiovascular;  Laterality: N/A;  . CHOLECYSTECTOMY    . HERNIA REPAIR  2011   Incisional and umbilical utilizing mesh  . ROTATOR CUFF REPAIR    . TEE WITHOUT CARDIOVERSION N/A 01/17/2016   Procedure: TRANSESOPHAGEAL ECHOCARDIOGRAM (TEE);  Surgeon: Herminio Commons, MD;  Location: [facility];  Service: Cardiovascular;  Laterality: N/A;  . TEE WITHOUT CARDIOVERSION N/A 02/21/2016   Procedure: TRANSESOPHAGEAL ECHOCARDIOGRAM (TEE) WITH PROPOFOL;  Surgeon: Herminio Commons, MD;  Location: [facility];  Service: Cardiovascular;  Laterality: N/A;  . TEE WITHOUT CARDIOVERSION N/A 03/20/2016   Procedure: TRANSESOPHAGEAL ECHOCARDIOGRAM (TEE) WITH PROPOFOL;  Surgeon: Satira Sark, MD;  Location: [facility];  Service: Cardiovascular;  Laterality: N/A;  . TONSILLECTOMY      FAMILY HISTORY Family History  Problem Relation Age of Onset  . Ovarian cancer Mother 9    Secondary to ovarian cancer  . Leukemia Mother   . Stroke Father 75    Brain stem infarction  . Breast cancer Paternal Aunt        ADVANCED DIRECTIVES:    HEALTH MAINTENANCE: Social History  Substance Use Topics  . Smoking status: Former Smoker    Packs/day: 0.25    Years: 2.00    Types: Cigarettes    Quit date: 08/03/1975  . Smokeless tobacco: Never Used  . Alcohol use  No     Colonoscopy:  PAP:  Bone density:  Lipid panel:  No Known Allergies  Current Outpatient Prescriptions  Medication Sig Dispense Refill  . acetaminophen (TYLENOL) 650 MG CR tablet Take 650 mg by mouth every 8 (eight) hours as needed for pain.     Marland Kitchen apixaban (ELIQUIS) 5 MG TABS tablet Take 1 tablet (5 mg total) by mouth 2  (two) times daily. 60 tablet 2  . diltiazem (CARDIZEM) 60 MG tablet Take 1 tablet (60 mg total) by mouth daily as needed.    . Ferrous Fumarate (IRON) 18 MG TBCR Take 1 tablet by mouth daily.     . flecainide (TAMBOCOR) 50 MG tablet Take 1 tablet (50 mg total) by mouth 2 (two) times daily. 180 tablet 3  . levothyroxine (SYNTHROID, LEVOTHROID) 125 MCG tablet Take 125 mcg by mouth daily before breakfast.    . metoprolol tartrate (LOPRESSOR) 50 MG tablet Take 1 tablet (50 mg total) by mouth 2 (two) times daily. 180 tablet 3  . pantoprazole (PROTONIX) 40 MG tablet Take 1 tablet (40 mg total) by mouth daily before breakfast. 30 tablet 5  . Probiotic Product (PROBIOTIC PO) Take 1 capsule by mouth daily.    . promethazine (PHENERGAN) 12.5 MG tablet Take 1 tablet (12.5 mg total) by mouth every 6 (six) hours as needed for nausea or vomiting. 30 tablet 0   No current facility-administered medications for this visit.    Facility-Administered Medications Ordered in Other Visits  Medication Dose Route Frequency Provider Last Rate Last Dose  . hydrocortisone cream 1 % 1 application  1 application Topical TID PRN Satira Sark, MD        OBJECTIVE: Vitals:   04/26/16 0920  BP: 116/76  Pulse: 91  Resp: 18  Temp: (!) 95.6 F (35.3 C)     Body mass index is 34.89 kg/m.    ECOG FS:0 - Asymptomatic  General: Well-developed, well-nourished, no acute distress. Eyes: Pink conjunctiva, anicteric sclera. HEENT: Minimally palpable cervical lymph nodes. Lungs: Clear to auscultation bilaterally. Heart: Regular rate and rhythm. No rubs, murmurs, or gallops. Abdomen: Soft, nontender, nondistended. Splenomegaly noted, normoactive bowel sounds. Musculoskeletal: No edema, cyanosis, or clubbing. Neuro: Alert, answering all questions appropriately. Cranial nerves grossly intact. Skin: No rashes or petechiae noted. Psych: Normal affect.  LAB RESULTS:  Lab Results  Component Value Date   NA 140 03/16/2016     K 3.5 03/16/2016   CL 116 (H) 03/16/2016   CO2 24 03/16/2016   GLUCOSE 114 (H) 03/16/2016   BUN 14 03/16/2016   CREATININE 0.74 03/16/2016   CALCIUM 8.9 03/16/2016   PROT 6.4 (L) 03/08/2016   ALBUMIN 4.0 03/08/2016   AST 21 03/08/2016   ALT 13 (L) 03/08/2016   ALKPHOS 88 03/08/2016   BILITOT 0.8 03/08/2016   GFRNONAA >60 03/16/2016   GFRAA >60 03/16/2016    Lab Results  Component Value Date   WBC 35.5 (H) 04/26/2016   NEUTROABS 4.0 04/26/2016   HGB 13.4 04/26/2016   HCT 40.4 04/26/2016   MCV 90.8 04/26/2016   PLT 118 (L) 04/26/2016     STUDIES: US Soft Tissue Head/neck  Result Date: 04/06/2016 CLINICAL DATA:  Abnormal PET-CT. Hyper metabolic isthmus nodule has enlarged. Hyper metabolic eft lobe nodule is new. A EXAM: THYROID ULTRASOUND TECHNIQUE: Ultrasound examination of the thyroid gland and adjacent soft tissues was performed. COMPARISON:  None. FINDINGS: Parenchymal Echotexture: Markedly heterogenous Estimated total number of nodules >/= 1 cm: 4 Number  of spongiform nodules > 2 cm not described below (TR1): 0 Number of mixed cystic and solid nodules > 1.5 cm not described below (TR2): 0 _________________________________________________________ Isthmus: 0.5 cm Nodule # 1: Location: Right; Mid Size: 2.1 x 1.6 x 2.1 cm Composition: solid/almost completely solid (2) Echogenicity: isoechoic (1) Shape: not taller-than-wide (0) Margins: smooth (0) Echogenic foci: punctate echogenic foci (3) ACR TI-RADS total points: 6. ACR TI-RADS risk category: TR4 (4-6 points). ACR TI-RADS recommendations: **Given size (>1.5 cm) and appearance, fine needle aspiration of this moderately suspicious nodule should be considered based on TI-RADS criteria. _________________________________________________________ Right lobe: 4.7 x 2.0 x 1.4 cm Nodule # 2: Location: Right; Mid Size: 1.2 x 0.8 x 0.4 cm Composition: solid/almost completely solid (2) Echogenicity: hypoechoic (2) Shape: not taller-than-wide (0)  Margins: smooth (0) Echogenic foci: none (0) ACR TI-RADS total points: 4. ACR TI-RADS risk category: TR4 (4-6 points). ACR TI-RADS recommendations: *Given size (1 - 1.5 cm) and appearance, a follow-up ultrasound in 1 year should be considered based on TI-RADS criteria. Other smaller nodules are noted. _________________________________________________________ Left lobe: 4.8 x 2.2 x 2.5 cm Nodule # 3: Location: Left; Inferior Size: 2.4 x 2.3 x 2.2 cm Composition: solid/almost completely solid (2) Echogenicity: isoechoic (1) Shape: taller-than-wide (3) Margins: smooth (0) Echogenic foci: none (0) ACR TI-RADS total points: 6. ACR TI-RADS risk category: TR4 (4-6 points). ACR TI-RADS recommendations: **Given size (>1.5 cm) and appearance, fine needle aspiration of this moderately suspicious nodule should be considered based on TI-RADS criteria. Tiny lower pole nodule. IMPRESSION: The isthmic and left lobe nodules meet criteria for fine needle aspiration biopsy. These were noted to be hypermetabolic on PET and correspond to the abnormalities. A 1.2 cm right lobe nodule requires annual follow-up. The above is in keeping with the ACR TI-RADS recommendations - J Am Coll Radiol 2017;14:587-595. Electronically Signed   By: Marybelle Killings M.D.   On: 04/06/2016 08:01    ASSESSMENT: CLL confirmed by peripheral blood flow cytometry, Rai stage 2.  PLAN:    1. CLL: PET scan results from March 22, 2016 reviewed independently and reported as above with likely progression of disease with increasing lymphadenopathy as well as splenomegaly. Her white blood cell count is only mildly elevated above her baseline. Biopsy was not pursued given the difficulty of the procedure. Will consider biopsy in the future given the concern for possible transformation to a more aggressive type of lymphoma. Unclear if patient's symptoms last week for a reaction or related to increased anxiety. She was heavily premedicated today and completed cycle 1  of 4 of weekly Rituxan without a problem. Return to clinic in 1 week for consideration of cycle 2.  2. Early satiety, nausea, vomiting: Likely related to patient's massive splenomegaly. 3. Atrial fibrillation: Patient states she has possible cardioversion in the near future which she has been instructed to pursue in parallel with her oncology appointments. 4. Iron deficiency anemia: Patient's hemoglobin is within normal limits. She last received IV Feraheme in May 2017.  5. PET positive thyroid lesions: Will consider ultrasound-guided biopsy of the near future.  Approximately 30 minutes was spent in discussion of which greater than 50% was consultation.  Patient expressed understanding and was in agreement with this plan. She also understands that She can call clinic at any time with any questions, concerns, or complaints.     Lloyd Huger, MD   04/29/2016 9:14 AM

## 2016-04-25 NOTE — Patient Instructions (Signed)
Medication Instructions:  Your physician has recommended you make the following change in your medication:  1) Only take Diltiazem 60 mg as needed 2) Start Flecainide 50 mg twice daily 3) Increase Metoprolol to 50 mg twice daily   Labwork: None ordered   Testing/Procedures: None ordered   Follow-Up: Your physician recommends that you schedule a follow-up appointment in: afib clinic Mon   Any Other Special Instructions Will Be Listed Below (If Applicable).     If you need a refill on your cardiac medications before your next appointment, please call your pharmacy.

## 2016-04-26 ENCOUNTER — Inpatient Hospital Stay (HOSPITAL_BASED_OUTPATIENT_CLINIC_OR_DEPARTMENT_OTHER): Payer: Medicare Other | Admitting: Oncology

## 2016-04-26 ENCOUNTER — Inpatient Hospital Stay: Payer: Medicare Other

## 2016-04-26 VITALS — BP 113/74 | HR 90 | Resp 18

## 2016-04-26 VITALS — BP 116/76 | HR 91 | Temp 95.6°F | Resp 18 | Wt 226.1 lb

## 2016-04-26 DIAGNOSIS — E0789 Other specified disorders of thyroid: Secondary | ICD-10-CM | POA: Diagnosis not present

## 2016-04-26 DIAGNOSIS — Z7901 Long term (current) use of anticoagulants: Secondary | ICD-10-CM

## 2016-04-26 DIAGNOSIS — I4891 Unspecified atrial fibrillation: Secondary | ICD-10-CM

## 2016-04-26 DIAGNOSIS — R6881 Early satiety: Secondary | ICD-10-CM

## 2016-04-26 DIAGNOSIS — C9110 Chronic lymphocytic leukemia of B-cell type not having achieved remission: Secondary | ICD-10-CM

## 2016-04-26 DIAGNOSIS — R5383 Other fatigue: Secondary | ICD-10-CM

## 2016-04-26 DIAGNOSIS — E785 Hyperlipidemia, unspecified: Secondary | ICD-10-CM

## 2016-04-26 DIAGNOSIS — R112 Nausea with vomiting, unspecified: Secondary | ICD-10-CM

## 2016-04-26 DIAGNOSIS — D509 Iron deficiency anemia, unspecified: Secondary | ICD-10-CM

## 2016-04-26 DIAGNOSIS — R109 Unspecified abdominal pain: Secondary | ICD-10-CM

## 2016-04-26 DIAGNOSIS — Z803 Family history of malignant neoplasm of breast: Secondary | ICD-10-CM

## 2016-04-26 DIAGNOSIS — R161 Splenomegaly, not elsewhere classified: Secondary | ICD-10-CM

## 2016-04-26 DIAGNOSIS — R5381 Other malaise: Secondary | ICD-10-CM

## 2016-04-26 DIAGNOSIS — R531 Weakness: Secondary | ICD-10-CM

## 2016-04-26 DIAGNOSIS — Z5111 Encounter for antineoplastic chemotherapy: Secondary | ICD-10-CM | POA: Diagnosis not present

## 2016-04-26 DIAGNOSIS — I10 Essential (primary) hypertension: Secondary | ICD-10-CM

## 2016-04-26 DIAGNOSIS — F419 Anxiety disorder, unspecified: Secondary | ICD-10-CM

## 2016-04-26 DIAGNOSIS — Z87891 Personal history of nicotine dependence: Secondary | ICD-10-CM

## 2016-04-26 DIAGNOSIS — K219 Gastro-esophageal reflux disease without esophagitis: Secondary | ICD-10-CM

## 2016-04-26 DIAGNOSIS — M1990 Unspecified osteoarthritis, unspecified site: Secondary | ICD-10-CM

## 2016-04-26 DIAGNOSIS — Z79899 Other long term (current) drug therapy: Secondary | ICD-10-CM

## 2016-04-26 LAB — CBC WITH DIFFERENTIAL/PLATELET
Basophils Absolute: 0.1 10*3/uL (ref 0–0.1)
Basophils Relative: 0 %
EOS ABS: 0.3 10*3/uL (ref 0–0.7)
Eosinophils Relative: 1 %
HEMATOCRIT: 40.4 % (ref 35.0–47.0)
HEMOGLOBIN: 13.4 g/dL (ref 12.0–16.0)
LYMPHS ABS: 30.4 10*3/uL — AB (ref 1.0–3.6)
LYMPHS PCT: 86 %
MCH: 30.1 pg (ref 26.0–34.0)
MCHC: 33.1 g/dL (ref 32.0–36.0)
MCV: 90.8 fL (ref 80.0–100.0)
MONOS PCT: 2 %
Monocytes Absolute: 0.6 10*3/uL (ref 0.2–0.9)
NEUTROS ABS: 4 10*3/uL (ref 1.4–6.5)
NEUTROS PCT: 11 %
Platelets: 118 10*3/uL — ABNORMAL LOW (ref 150–440)
RBC: 4.45 MIL/uL (ref 3.80–5.20)
RDW: 15.6 % — ABNORMAL HIGH (ref 11.5–14.5)
WBC: 35.5 10*3/uL — AB (ref 3.6–11.0)

## 2016-04-26 MED ORDER — DIPHENHYDRAMINE HCL 25 MG PO CAPS
25.0000 mg | ORAL_CAPSULE | Freq: Once | ORAL | Status: AC
  Administered 2016-04-26: 25 mg via ORAL
  Filled 2016-04-26: qty 1

## 2016-04-26 MED ORDER — SODIUM CHLORIDE 0.9 % IV SOLN
Freq: Once | INTRAVENOUS | Status: AC
  Administered 2016-04-26: 10:00:00 via INTRAVENOUS
  Filled 2016-04-26: qty 1000

## 2016-04-26 MED ORDER — SODIUM CHLORIDE 0.9 % IV SOLN: 375.0000 mg/m2 | Freq: Once | INTRAVENOUS | Status: DC

## 2016-04-26 MED ORDER — FAMOTIDINE IN NACL 20-0.9 MG/50ML-% IV SOLN
20.0000 mg | Freq: Two times a day (BID) | INTRAVENOUS | Status: DC
  Administered 2016-04-26: 20 mg via INTRAVENOUS
  Filled 2016-04-26: qty 50

## 2016-04-26 MED ORDER — LORAZEPAM 2 MG/ML IJ SOLN
1.0000 mg | Freq: Once | INTRAMUSCULAR | Status: AC
  Administered 2016-04-26: 1 mg via INTRAVENOUS
  Filled 2016-04-26: qty 1

## 2016-04-26 MED ORDER — SODIUM CHLORIDE 0.9 % IV SOLN
Freq: Once | INTRAVENOUS | Status: AC
  Administered 2016-04-26: 10:00:00 via INTRAVENOUS
  Filled 2016-04-26: qty 4

## 2016-04-26 MED ORDER — SODIUM CHLORIDE 0.9 % IV SOLN
375.0000 mg/m2 | Freq: Once | INTRAVENOUS | Status: AC
  Administered 2016-04-26: 800 mg via INTRAVENOUS
  Filled 2016-04-26: qty 50

## 2016-04-26 MED ORDER — ACETAMINOPHEN 325 MG PO TABS
650.0000 mg | ORAL_TABLET | Freq: Once | ORAL | Status: AC
  Administered 2016-04-26: 650 mg via ORAL
  Filled 2016-04-26: qty 2

## 2016-04-27 NOTE — Telephone Encounter (Signed)
ERROR

## 2016-04-30 ENCOUNTER — Ambulatory Visit (HOSPITAL_COMMUNITY): Payer: Medicare Other | Admitting: Nurse Practitioner

## 2016-04-30 NOTE — Progress Notes (Signed)
Electrophysiology Office Note   Date:  04/30/2016   ID:  Daisy Black [birth year], MRN [id number]  PCP:  Daisy Rival, NP  Cardiologist:  Dr Domenic Polite Primary Electrophysiologist: Thompson Grayer, MD    Chief Complaint  Patient presents with  . Atrial Fibrillation     History of Present Illness: Daisy Black is a 65 y.o. female who presents today for electrophysiology evaluation.   The patient has symptomatic persistent afib.  She reports symptoms of worsening fatigue and decreased exercise tolerance.  She underwent cardioversion and felt "great".  Unfortunately she quickly returned to afib.  She has had worsening symptoms since that time.  She has not tried AAD therapy.  She reports compliance with eliquis.   Today, she denies symptoms of palpitations, chest pain, shortness of breath, orthopnea, PND, lower extremity edema, claudication, dizziness, presyncope, syncope, bleeding, or neurologic sequela. The patient is tolerating medications without difficulties and is otherwise without complaint today.    Past Medical History:  Diagnosis Date  . Arthritis   . Atrial fibrillation ([place])    Atrial fibrillation - onset in 09/2009; DC cardioversion in 10/2009  . CLL (chronic lymphocytic leukemia) ([place])   . Essential hypertension   . GERD (gastroesophageal reflux disease)   . History of hiatal hernia   . Hyperlipidemia   . Lymphocytosis   . Obesity   . Spleen enlarged   . TSH elevation    Past Surgical History:  Procedure Laterality Date  . ANKLE SURGERY Right   . CARDIOVERSION N/A 03/20/2016   Procedure: CARDIOVERSION;  Surgeon: Satira Sark, MD;  Location: [facility];  Service: Cardiovascular;  Laterality: N/A;  . CHOLECYSTECTOMY    . HERNIA REPAIR  2011   Incisional and umbilical utilizing mesh  . ROTATOR CUFF REPAIR    . TEE WITHOUT CARDIOVERSION N/A 01/17/2016   Procedure: TRANSESOPHAGEAL ECHOCARDIOGRAM (TEE);  Surgeon: Herminio Commons, MD;  Location: [facility];  Service: Cardiovascular;  Laterality: N/A;  . TEE WITHOUT CARDIOVERSION N/A 02/21/2016   Procedure: TRANSESOPHAGEAL ECHOCARDIOGRAM (TEE) WITH PROPOFOL;  Surgeon: Herminio Commons, MD;  Location: [facility];  Service: Cardiovascular;  Laterality: N/A;  . TEE WITHOUT CARDIOVERSION N/A 03/20/2016   Procedure: TRANSESOPHAGEAL ECHOCARDIOGRAM (TEE) WITH PROPOFOL;  Surgeon: Satira Sark, MD;  Location: [facility];  Service: Cardiovascular;  Laterality: N/A;  . TONSILLECTOMY       Current Outpatient Prescriptions  Medication Sig Dispense Refill  . acetaminophen (TYLENOL) 650 MG CR tablet Take 650 mg by mouth every 8 (eight) hours as needed for pain.     Marland Kitchen apixaban (ELIQUIS) 5 MG TABS tablet Take 1 tablet (5 mg total) by mouth 2 (two) times daily. 60 tablet 2  . diltiazem (CARDIZEM) 60 MG tablet Take 1 tablet (60 mg total) by mouth daily as needed.    . Ferrous Fumarate (IRON) 18 MG TBCR Take 1 tablet by mouth daily.     Marland Kitchen levothyroxine (SYNTHROID, LEVOTHROID) 125 MCG tablet Take 125 mcg by mouth daily before breakfast.    . metoprolol tartrate (LOPRESSOR) 50 MG tablet Take 1 tablet (50 mg total) by mouth 2 (two) times daily. 180 tablet 3  . pantoprazole (PROTONIX) 40 MG tablet Take 1 tablet (40 mg total) by mouth daily before breakfast. 30 tablet 5  . Probiotic Product (PROBIOTIC PO) Take 1 capsule by mouth daily.    . promethazine (PHENERGAN) 12.5 MG tablet Take 1 tablet (12.5 mg total) by mouth every 6 (six) hours as  needed for nausea or vomiting. 30 tablet 0  . flecainide (TAMBOCOR) 50 MG tablet Take 1 tablet (50 mg total) by mouth 2 (two) times daily. 180 tablet 3   No current facility-administered medications for this visit.    Facility-Administered Medications Ordered in Other Visits  Medication Dose Route Frequency Provider Last Rate Last Dose  . hydrocortisone cream 1 % 1 application  1 application Topical TID PRN Satira Sark, MD        Allergies:   Review of patient's  allergies indicates no known allergies.   Social History:  The patient  reports that she quit smoking about 40 years ago. Her smoking use included Cigarettes. She has a 0.50 pack-year smoking history. She has never used smokeless tobacco. She reports that she does not drink alcohol or use drugs.   Family History:  The patient's  family history includes Breast cancer in her paternal aunt; Leukemia in her mother; Ovarian cancer (age of onset: 42) in her mother; Stroke (age of onset: 42) in her father.    ROS:  Please see the history of present illness.   All other systems are reviewed and negative.    PHYSICAL EXAM: VS:  BP 116/60   Pulse (!) 111   Ht 5' 7.5" (1.715 m)   Wt 227 lb 3.2 oz (103.1 kg)   BMI 35.06 kg/m  , BMI Body mass index is 35.06 kg/m. GEN: Well nourished, well developed, in no acute distress  HEENT: normal  Neck: no JVD, carotid bruits, or masses Cardiac: iRRR; no murmurs, rubs, or gallops,no edema  Respiratory:  clear to auscultation bilaterally, normal work of breathing GI: soft, nontender, nondistended, + BS MS: no deformity or atrophy  Skin: warm and dry  Neuro:  Strength and sensation are intact Psych: euthymic mood, full affect  EKG:  EKG reveals afib   Recent Labs: 01/12/2016: Magnesium 2.2; TSH 1.986 03/08/2016: ALT 13 03/16/2016: BUN 14; Creatinine, Ser 0.74; Potassium 3.5; Sodium 140 04/26/2016: Hemoglobin 13.4; Platelets 118    Lipid Panel     Component Value Date/Time   CHOL 219 (H) 11/30/2009 1950   TRIG 69 11/30/2009 1950   HDL 56 11/30/2009 1950   CHOLHDL 3.9 Ratio 11/30/2009 1950   VLDL 14 11/30/2009 1950   LDLCALC 149 (H) 11/30/2009 1950     Wt Readings from Last 3 Encounters:  04/26/16 226 lb 1.3 oz (102.6 kg)  04/25/16 227 lb 3.2 oz (103.1 kg)  04/18/16 229 lb 13.3 oz (104.2 kg)     Other studies Reviewed: Additional studies/ records that were reviewed today include: prior echo, Dr Chuck Hint records  Review of the above records  today demonstrates: as above   ASSESSMENT AND PLAN:  1.  Persistent afib The patient has symptomatic persistent afib.  Therapeutic strategies for afib including medicine and ablation were discussed in detail with the patient today. Given persistent afib, I have advised AAD therapy.  Will start flecainide 50mg  BID.  Increase metoprolol to 50mg  BID.  She reports poor tolerance of diltiazem (worsening fatigue) and thus I will wean this medicine. Return to the AF clinic next week.  If she has not returned to sinus, I would advise increasing flecainide to 100mg  BID at that time.  If she remains in AF with this change, then cardioversion is advised the following week. Continue anticoagulation.  2. HTN Stable No change required today  3. Obesity Body mass index is 35.06 kg/m. Weight loss advised  4. CLL/ splenomegally Stable No  change required today  Follow-up:  AF CLINIC next week  Current medicines are reviewed at length with the patient today.   The patient does not have concerns regarding her medicines.  The following changes were made today:  none  Labs/ tests ordered today include:  Orders Placed This Encounter  Procedures  . EKG 12-Lead     Signed, Thompson Grayer, MD    [place] [address] [phone] (office) [phone] (fax)

## 2016-05-01 ENCOUNTER — Other Ambulatory Visit: Payer: Self-pay

## 2016-05-01 ENCOUNTER — Encounter (HOSPITAL_COMMUNITY): Payer: Self-pay | Admitting: Nurse Practitioner

## 2016-05-01 ENCOUNTER — Ambulatory Visit (HOSPITAL_COMMUNITY)
Admission: RE | Admit: 2016-05-01 | Discharge: 2016-05-01 | Disposition: A | Discharge status: Home / Self Care | Payer: Medicare Other | Source: Ambulatory Visit | Attending: Nurse Practitioner | Admitting: Nurse Practitioner

## 2016-05-01 VITALS — BP 102/70 | HR 119 | Ht 67.5 in | Wt 227.0 lb

## 2016-05-01 DIAGNOSIS — I481 Persistent atrial fibrillation: Secondary | ICD-10-CM | POA: Diagnosis present

## 2016-05-01 DIAGNOSIS — I10 Essential (primary) hypertension: Secondary | ICD-10-CM | POA: Diagnosis not present

## 2016-05-01 DIAGNOSIS — R161 Splenomegaly, not elsewhere classified: Secondary | ICD-10-CM | POA: Insufficient documentation

## 2016-05-01 DIAGNOSIS — Z7901 Long term (current) use of anticoagulants: Secondary | ICD-10-CM | POA: Diagnosis not present

## 2016-05-01 DIAGNOSIS — E785 Hyperlipidemia, unspecified: Secondary | ICD-10-CM | POA: Diagnosis not present

## 2016-05-01 DIAGNOSIS — E669 Obesity, unspecified: Secondary | ICD-10-CM | POA: Diagnosis not present

## 2016-05-01 DIAGNOSIS — I480 Paroxysmal atrial fibrillation: Secondary | ICD-10-CM

## 2016-05-01 DIAGNOSIS — C9110 Chronic lymphocytic leukemia of B-cell type not having achieved remission: Secondary | ICD-10-CM | POA: Insufficient documentation

## 2016-05-01 DIAGNOSIS — K219 Gastro-esophageal reflux disease without esophagitis: Secondary | ICD-10-CM | POA: Diagnosis not present

## 2016-05-01 DIAGNOSIS — Z87891 Personal history of nicotine dependence: Secondary | ICD-10-CM | POA: Diagnosis not present

## 2016-05-01 DIAGNOSIS — M1990 Unspecified osteoarthritis, unspecified site: Secondary | ICD-10-CM | POA: Diagnosis not present

## 2016-05-01 DIAGNOSIS — Z6835 Body mass index (BMI) 35.0-35.9, adult: Secondary | ICD-10-CM | POA: Insufficient documentation

## 2016-05-01 DIAGNOSIS — I4819 Other persistent atrial fibrillation: Secondary | ICD-10-CM

## 2016-05-01 MED ORDER — FLECAINIDE ACETATE 50 MG PO TABS: 100.0000 mg | ORAL_TABLET | Freq: Two times a day (BID) | ORAL | 3 refills | Status: DC

## 2016-05-01 NOTE — Patient Instructions (Signed)
Your physician has recommended you make the following change in your medication:  1)Increase flecainide to 100mg  twice a day

## 2016-05-02 NOTE — Progress Notes (Signed)
Daisy Black  [phone] [phone]  ID: Daisy Black OB: 06-19-1951  MR#: [id number]  [id number]  Patient Care Team: Renee Rival, NP as PCP - General (Nurse Practitioner) Aviva Signs, MD (General Surgery) Rico Junker, RN as Registered Nurse Theodore Demark, RN as Registered Nurse Satira Sark, MD as Consulting Physician (Cardiology) Daneil Dolin, MD as Consulting Physician (Gastroenterology)  CHIEF COMPLAINT: CLL  INTERVAL HISTORY: Patient returns to clinic today for further evaluation and consideration of cycle 2 of weekly single agent Rituxan. She tolerated cycle 1 last week without concern for reaction. Patient states her weakness and fatigue have improved. She no longer complains of early satiety, reflux, or nausea. She continues to be anxious. She has no fevers, chills, or night sweats. She has no chest pain or shortness of breath. She denies any constipation or diarrhea. She has no melanoma or hematochezia. Patient offers no further specific complaints today.  REVIEW OF SYSTEMS:   Review of Systems  Constitutional: Positive for malaise/fatigue. Negative for diaphoresis, fever and weight loss.  Respiratory: Negative.  Negative for cough and shortness of breath.   Cardiovascular: Negative.  Negative for chest pain and leg swelling.  Gastrointestinal: Negative.  Negative for abdominal pain, blood in stool, constipation, diarrhea, heartburn, melena, nausea and vomiting.  Musculoskeletal: Negative.   Neurological: Positive for weakness.  Psychiatric/Behavioral: The patient is nervous/anxious.     As per HPI. Otherwise, a complete review of systems is negative.  PAST MEDICAL HISTORY: Past Medical History:  Diagnosis Date  . Arthritis   . Atrial fibrillation ([place])    Atrial fibrillation - onset in 09/2009; DC cardioversion in 10/2009  . CLL (chronic lymphocytic leukemia) ([place])   . Essential hypertension   . GERD  (gastroesophageal reflux disease)   . History of hiatal hernia   . Hyperlipidemia   . Lymphocytosis   . Obesity   . Spleen enlarged   . TSH elevation     PAST SURGICAL HISTORY: Past Surgical History:  Procedure Laterality Date  . ANKLE SURGERY Right   . CARDIOVERSION N/A 03/20/2016   Procedure: CARDIOVERSION;  Surgeon: Satira Sark, MD;  Location: [facility];  Service: Cardiovascular;  Laterality: N/A;  . CHOLECYSTECTOMY    . HERNIA REPAIR  2011   Incisional and umbilical utilizing mesh  . ROTATOR CUFF REPAIR    . TEE WITHOUT CARDIOVERSION N/A 01/17/2016   Procedure: TRANSESOPHAGEAL ECHOCARDIOGRAM (TEE);  Surgeon: Herminio Commons, MD;  Location: [facility];  Service: Cardiovascular;  Laterality: N/A;  . TEE WITHOUT CARDIOVERSION N/A 02/21/2016   Procedure: TRANSESOPHAGEAL ECHOCARDIOGRAM (TEE) WITH PROPOFOL;  Surgeon: Herminio Commons, MD;  Location: [facility];  Service: Cardiovascular;  Laterality: N/A;  . TEE WITHOUT CARDIOVERSION N/A 03/20/2016   Procedure: TRANSESOPHAGEAL ECHOCARDIOGRAM (TEE) WITH PROPOFOL;  Surgeon: Satira Sark, MD;  Location: [facility];  Service: Cardiovascular;  Laterality: N/A;  . TONSILLECTOMY      FAMILY HISTORY Family History  Problem Relation Age of Onset  . Ovarian cancer Mother 12    Secondary to ovarian cancer  . Leukemia Mother   . Stroke Father 10    Brain stem infarction  . Breast cancer Paternal Aunt        ADVANCED DIRECTIVES:    HEALTH MAINTENANCE: Social History  Substance Use Topics  . Smoking status: Former Smoker    Packs/day: 0.25    Years: 2.00    Types: Cigarettes    Quit date: 08/03/1975  .  Smokeless tobacco: Never Used  . Alcohol use No     Colonoscopy:  PAP:  Bone density:  Lipid panel:  No Known Allergies  Current Outpatient Prescriptions  Medication Sig Dispense Refill  . acetaminophen (TYLENOL) 650 MG CR tablet Take 650 mg by mouth every 8 (eight) hours as needed for pain.     Marland Kitchen apixaban (ELIQUIS) 5 MG  TABS tablet Take 1 tablet (5 mg total) by mouth 2 (two) times daily. 60 tablet 2  . diltiazem (CARDIZEM) 60 MG tablet Take 1 tablet (60 mg total) by mouth daily as needed.    . Ferrous Fumarate (IRON) 18 MG TBCR Take 1 tablet by mouth daily.     . flecainide (TAMBOCOR) 50 MG tablet Take 2 tablets (100 mg total) by mouth 2 (two) times daily. 180 tablet 3  . levothyroxine (SYNTHROID, LEVOTHROID) 125 MCG tablet Take 125 mcg by mouth daily before breakfast.    . metoprolol tartrate (LOPRESSOR) 50 MG tablet Take 1 tablet (50 mg total) by mouth 2 (two) times daily. (Patient taking differently: Take 50 mg by mouth 3 (three) times daily. ) 180 tablet 3  . pantoprazole (PROTONIX) 40 MG tablet Take 1 tablet (40 mg total) by mouth daily before breakfast. 30 tablet 5  . Probiotic Product (PROBIOTIC PO) Take 1 capsule by mouth daily.    . promethazine (PHENERGAN) 12.5 MG tablet Take 1 tablet (12.5 mg total) by mouth every 6 (six) hours as needed for nausea or vomiting. 30 tablet 0   No current facility-administered medications for this visit.    Facility-Administered Medications Ordered in Other Visits  Medication Dose Route Frequency Provider Last Rate Last Dose  . hydrocortisone cream 1 % 1 application  1 application Topical TID PRN Satira Sark, MD        OBJECTIVE: Vitals:   05/03/16 0954  BP: 102/71  Pulse: 84  Resp: 18  Temp: (!) 96.3 F (35.7 C)     Body mass index is 34.87 kg/m.    ECOG FS:0 - Asymptomatic  General: Well-developed, well-nourished, no acute distress. Eyes: Pink conjunctiva, anicteric sclera. HEENT: Minimally palpable cervical lymph nodes. Lungs: Clear to auscultation bilaterally. Heart: Regular rate and rhythm. No rubs, murmurs, or gallops. Abdomen: Soft, nontender, nondistended. Splenomegaly noted, normoactive bowel sounds. Musculoskeletal: No edema, cyanosis, or clubbing. Neuro: Alert, answering all questions appropriately. Cranial nerves grossly intact. Skin: No  rashes or petechiae noted. Psych: Normal affect.  LAB RESULTS:  Lab Results  Component Value Date   NA 140 03/16/2016   K 3.5 03/16/2016   CL 116 (H) 03/16/2016   CO2 24 03/16/2016   GLUCOSE 114 (H) 03/16/2016   BUN 14 03/16/2016   CREATININE 0.74 03/16/2016   CALCIUM 8.9 03/16/2016   PROT 6.4 (L) 03/08/2016   ALBUMIN 4.0 03/08/2016   AST 21 03/08/2016   ALT 13 (L) 03/08/2016   ALKPHOS 88 03/08/2016   BILITOT 0.8 03/08/2016   GFRNONAA >60 03/16/2016   GFRAA >60 03/16/2016    Lab Results  Component Value Date   WBC 25.0 (H) 05/03/2016   NEUTROABS 5.4 05/03/2016   HGB 14.0 05/03/2016   HCT 42.0 05/03/2016   MCV 90.7 05/03/2016   PLT 108 (L) 05/03/2016     STUDIES: US Soft Tissue Head/neck  Result Date: 04/06/2016 CLINICAL DATA:  Abnormal PET-CT. Hyper metabolic isthmus nodule has enlarged. Hyper metabolic eft lobe nodule is new. A EXAM: THYROID ULTRASOUND TECHNIQUE: Ultrasound examination of the thyroid gland and adjacent soft tissues  was performed. COMPARISON:  None. FINDINGS: Parenchymal Echotexture: Markedly heterogenous Estimated total number of nodules >/= 1 cm: 4 Number of spongiform nodules > 2 cm not described below (TR1): 0 Number of mixed cystic and solid nodules > 1.5 cm not described below (TR2): 0 _________________________________________________________ Isthmus: 0.5 cm Nodule # 1: Location: Right; Mid Size: 2.1 x 1.6 x 2.1 cm Composition: solid/almost completely solid (2) Echogenicity: isoechoic (1) Shape: not taller-than-wide (0) Margins: smooth (0) Echogenic foci: punctate echogenic foci (3) ACR TI-RADS total points: 6. ACR TI-RADS risk category: TR4 (4-6 points). ACR TI-RADS recommendations: **Given size (>1.5 cm) and appearance, fine needle aspiration of this moderately suspicious nodule should be considered based on TI-RADS criteria. _________________________________________________________ Right lobe: 4.7 x 2.0 x 1.4 cm Nodule # 2: Location: Right; Mid Size:  1.2 x 0.8 x 0.4 cm Composition: solid/almost completely solid (2) Echogenicity: hypoechoic (2) Shape: not taller-than-wide (0) Margins: smooth (0) Echogenic foci: none (0) ACR TI-RADS total points: 4. ACR TI-RADS risk category: TR4 (4-6 points). ACR TI-RADS recommendations: *Given size (1 - 1.5 cm) and appearance, a follow-up ultrasound in 1 year should be considered based on TI-RADS criteria. Other smaller nodules are noted. _________________________________________________________ Left lobe: 4.8 x 2.2 x 2.5 cm Nodule # 3: Location: Left; Inferior Size: 2.4 x 2.3 x 2.2 cm Composition: solid/almost completely solid (2) Echogenicity: isoechoic (1) Shape: taller-than-wide (3) Margins: smooth (0) Echogenic foci: none (0) ACR TI-RADS total points: 6. ACR TI-RADS risk category: TR4 (4-6 points). ACR TI-RADS recommendations: **Given size (>1.5 cm) and appearance, fine needle aspiration of this moderately suspicious nodule should be considered based on TI-RADS criteria. Tiny lower pole nodule. IMPRESSION: The isthmic and left lobe nodules meet criteria for fine needle aspiration biopsy. These were noted to be hypermetabolic on PET and correspond to the abnormalities. A 1.2 cm right lobe nodule requires annual follow-up. The above is in keeping with the ACR TI-RADS recommendations - J Am Coll Radiol 2017;14:587-595. Electronically Signed   By: Marybelle Killings M.D.   On: 04/06/2016 08:01    ASSESSMENT: CLL confirmed by peripheral blood flow cytometry, Rai stage 2.  PLAN:    1. CLL: PET scan results from March 22, 2016 reviewed independently and reported as above with likely progression of disease with increasing lymphadenopathy as well as splenomegaly. Her white blood cell count is now within normal limits. Biopsy was not pursued given the difficulty of the procedure. Will consider biopsy in the future given the concern for possible transformation to a more aggressive type of lymphoma. Proceed with cycle 2 of 4 of  weekly Rituxan today. Return to clinic in 1 week for consideration of cycle 3.  2. Early satiety, nausea, vomiting: Improving. Likely related to patient's massive splenomegaly. 3. Atrial fibrillation: Patient states she has possible cardioversion in the near future which she has been instructed to pursue in parallel with her oncology appointments. 4. Iron deficiency anemia: Patient's hemoglobin is within normal limits. She last received IV Feraheme in May 2017.  5. PET positive thyroid lesions: Will consider ultrasound-guided biopsy of the near future.  Approximately 30 minutes was spent in discussion of which greater than 50% was consultation.  Patient expressed understanding and was in agreement with this plan. She also understands that She can call clinic at any time with any questions, concerns, or complaints.     Lloyd Huger, MD   05/04/2016 2:09 PM

## 2016-05-03 ENCOUNTER — Inpatient Hospital Stay: Payer: Medicare Other

## 2016-05-03 ENCOUNTER — Inpatient Hospital Stay: Payer: Medicare Other | Attending: Oncology | Admitting: Oncology

## 2016-05-03 VITALS — BP 90/60 | HR 102 | Temp 95.9°F | Resp 18

## 2016-05-03 VITALS — BP 102/71 | HR 84 | Temp 96.3°F | Resp 18 | Wt 226.0 lb

## 2016-05-03 DIAGNOSIS — R161 Splenomegaly, not elsewhere classified: Secondary | ICD-10-CM | POA: Diagnosis not present

## 2016-05-03 DIAGNOSIS — C9110 Chronic lymphocytic leukemia of B-cell type not having achieved remission: Secondary | ICD-10-CM

## 2016-05-03 DIAGNOSIS — E079 Disorder of thyroid, unspecified: Secondary | ICD-10-CM

## 2016-05-03 DIAGNOSIS — F419 Anxiety disorder, unspecified: Secondary | ICD-10-CM | POA: Diagnosis not present

## 2016-05-03 DIAGNOSIS — R5383 Other fatigue: Secondary | ICD-10-CM

## 2016-05-03 DIAGNOSIS — E785 Hyperlipidemia, unspecified: Secondary | ICD-10-CM

## 2016-05-03 DIAGNOSIS — I480 Paroxysmal atrial fibrillation: Secondary | ICD-10-CM | POA: Diagnosis not present

## 2016-05-03 DIAGNOSIS — R5381 Other malaise: Secondary | ICD-10-CM | POA: Diagnosis not present

## 2016-05-03 DIAGNOSIS — D509 Iron deficiency anemia, unspecified: Secondary | ICD-10-CM | POA: Diagnosis not present

## 2016-05-03 DIAGNOSIS — I4891 Unspecified atrial fibrillation: Secondary | ICD-10-CM

## 2016-05-03 DIAGNOSIS — Z5112 Encounter for antineoplastic immunotherapy: Secondary | ICD-10-CM | POA: Diagnosis present

## 2016-05-03 DIAGNOSIS — K219 Gastro-esophageal reflux disease without esophagitis: Secondary | ICD-10-CM

## 2016-05-03 DIAGNOSIS — R112 Nausea with vomiting, unspecified: Secondary | ICD-10-CM | POA: Diagnosis not present

## 2016-05-03 DIAGNOSIS — R531 Weakness: Secondary | ICD-10-CM | POA: Diagnosis not present

## 2016-05-03 DIAGNOSIS — Z87891 Personal history of nicotine dependence: Secondary | ICD-10-CM | POA: Diagnosis not present

## 2016-05-03 DIAGNOSIS — R6881 Early satiety: Secondary | ICD-10-CM | POA: Diagnosis not present

## 2016-05-03 DIAGNOSIS — I10 Essential (primary) hypertension: Secondary | ICD-10-CM

## 2016-05-03 DIAGNOSIS — M1990 Unspecified osteoarthritis, unspecified site: Secondary | ICD-10-CM | POA: Diagnosis not present

## 2016-05-03 DIAGNOSIS — Z8041 Family history of malignant neoplasm of ovary: Secondary | ICD-10-CM | POA: Diagnosis not present

## 2016-05-03 DIAGNOSIS — Z7901 Long term (current) use of anticoagulants: Secondary | ICD-10-CM | POA: Diagnosis not present

## 2016-05-03 DIAGNOSIS — Z79899 Other long term (current) drug therapy: Secondary | ICD-10-CM | POA: Diagnosis not present

## 2016-05-03 DIAGNOSIS — Z803 Family history of malignant neoplasm of breast: Secondary | ICD-10-CM | POA: Diagnosis not present

## 2016-05-03 LAB — CBC WITH DIFFERENTIAL/PLATELET
BASOS PCT: 0 %
Basophils Absolute: 0 10*3/uL (ref 0–0.1)
Eosinophils Absolute: 0.3 10*3/uL (ref 0–0.7)
Eosinophils Relative: 1 %
HEMATOCRIT: 42 % (ref 35.0–47.0)
Hemoglobin: 14 g/dL (ref 12.0–16.0)
LYMPHS ABS: 18.8 10*3/uL — AB (ref 1.0–3.6)
Lymphocytes Relative: 76 %
MCH: 30.1 pg (ref 26.0–34.0)
MCHC: 33.2 g/dL (ref 32.0–36.0)
MCV: 90.7 fL (ref 80.0–100.0)
MONOS PCT: 2 %
Monocytes Absolute: 0.5 10*3/uL (ref 0.2–0.9)
NEUTROS ABS: 5.4 10*3/uL (ref 1.4–6.5)
Neutrophils Relative %: 21 %
Platelets: 108 10*3/uL — ABNORMAL LOW (ref 150–440)
RBC: 4.63 MIL/uL (ref 3.80–5.20)
RDW: 15.8 % — AB (ref 11.5–14.5)
WBC: 25 10*3/uL — ABNORMAL HIGH (ref 3.6–11.0)

## 2016-05-03 MED ORDER — ACETAMINOPHEN 325 MG PO TABS
650.0000 mg | ORAL_TABLET | Freq: Once | ORAL | Status: AC
  Administered 2016-05-03: 650 mg via ORAL
  Filled 2016-05-03: qty 2

## 2016-05-03 MED ORDER — SODIUM CHLORIDE 0.9 % IV SOLN: 375.0000 mg/m2 | Freq: Once | INTRAVENOUS | Status: DC

## 2016-05-03 MED ORDER — SODIUM CHLORIDE 0.9 % IV SOLN
375.0000 mg/m2 | Freq: Once | INTRAVENOUS | Status: AC
  Administered 2016-05-03: 800 mg via INTRAVENOUS
  Filled 2016-05-03: qty 50

## 2016-05-03 MED ORDER — SODIUM CHLORIDE 0.9 % IV SOLN
Freq: Once | INTRAVENOUS | Status: AC
  Administered 2016-05-03: 11:00:00 via INTRAVENOUS
  Filled 2016-05-03: qty 4

## 2016-05-03 MED ORDER — LORAZEPAM 2 MG/ML IJ SOLN
1.0000 mg | Freq: Once | INTRAMUSCULAR | Status: AC
  Administered 2016-05-03: 1 mg via INTRAVENOUS
  Filled 2016-05-03: qty 1

## 2016-05-03 MED ORDER — SODIUM CHLORIDE 0.9 % IV SOLN
Freq: Once | INTRAVENOUS | Status: AC
  Administered 2016-05-03: 11:00:00 via INTRAVENOUS
  Filled 2016-05-03: qty 1000

## 2016-05-03 MED ORDER — DIPHENHYDRAMINE HCL 25 MG PO CAPS
25.0000 mg | ORAL_CAPSULE | Freq: Once | ORAL | Status: AC
  Administered 2016-05-03: 25 mg via ORAL
  Filled 2016-05-03: qty 1

## 2016-05-03 MED ORDER — FAMOTIDINE IN NACL 20-0.9 MG/50ML-% IV SOLN
20.0000 mg | Freq: Two times a day (BID) | INTRAVENOUS | Status: DC
  Administered 2016-05-03: 20 mg via INTRAVENOUS
  Filled 2016-05-03: qty 50

## 2016-05-03 NOTE — Progress Notes (Signed)
States is feeling well today. Only complaint is stomach discomfort.

## 2016-05-03 NOTE — Progress Notes (Signed)
Electrophysiology Office Note   Date:  05/03/2016   ID:  Delsey, Fiedor 17-Aug-1950, MRN [id number]  PCP:  Renee Rival, NP  Cardiologist:  Dr Domenic Polite Primary Electrophysiologist: Thompson Grayer, MD    No chief complaint on file.    History of Present Illness: Daisy Black is a 65 y.o. female who presents today for electrophysiology follow-up.   She has started flecainide but remains in afib.  She reports compliance with eliquis.  She is a little overwhelmed today with all of her health issues. Today, she denies symptoms of palpitations, chest pain, shortness of breath, orthopnea, PND, lower extremity edema, claudication, dizziness, presyncope, syncope, bleeding, or neurologic sequela. The patient is tolerating medications without difficulties and is otherwise without complaint today.    Past Medical History:  Diagnosis Date  . Arthritis   . Atrial fibrillation ([place])    Atrial fibrillation - onset in 09/2009; DC cardioversion in 10/2009  . CLL (chronic lymphocytic leukemia) ([place])   . Essential hypertension   . GERD (gastroesophageal reflux disease)   . History of hiatal hernia   . Hyperlipidemia   . Lymphocytosis   . Obesity   . Spleen enlarged   . TSH elevation    Past Surgical History:  Procedure Laterality Date  . ANKLE SURGERY Right   . CARDIOVERSION N/A 03/20/2016   Procedure: CARDIOVERSION;  Surgeon: Satira Sark, MD;  Location: [facility];  Service: Cardiovascular;  Laterality: N/A;  . CHOLECYSTECTOMY    . HERNIA REPAIR  2011   Incisional and umbilical utilizing mesh  . ROTATOR CUFF REPAIR    . TEE WITHOUT CARDIOVERSION N/A 01/17/2016   Procedure: TRANSESOPHAGEAL ECHOCARDIOGRAM (TEE);  Surgeon: Herminio Commons, MD;  Location: [facility];  Service: Cardiovascular;  Laterality: N/A;  . TEE WITHOUT CARDIOVERSION N/A 02/21/2016   Procedure: TRANSESOPHAGEAL ECHOCARDIOGRAM (TEE) WITH PROPOFOL;  Surgeon: Herminio Commons, MD;  Location: [facility];  Service:  Cardiovascular;  Laterality: N/A;  . TEE WITHOUT CARDIOVERSION N/A 03/20/2016   Procedure: TRANSESOPHAGEAL ECHOCARDIOGRAM (TEE) WITH PROPOFOL;  Surgeon: Satira Sark, MD;  Location: [facility];  Service: Cardiovascular;  Laterality: N/A;  . TONSILLECTOMY       Current Outpatient Prescriptions  Medication Sig Dispense Refill  . acetaminophen (TYLENOL) 650 MG CR tablet Take 650 mg by mouth every 8 (eight) hours as needed for pain.     Marland Kitchen apixaban (ELIQUIS) 5 MG TABS tablet Take 1 tablet (5 mg total) by mouth 2 (two) times daily. 60 tablet 2  . diltiazem (CARDIZEM) 60 MG tablet Take 1 tablet (60 mg total) by mouth daily as needed.    . Ferrous Fumarate (IRON) 18 MG TBCR Take 1 tablet by mouth daily.     . flecainide (TAMBOCOR) 50 MG tablet Take 2 tablets (100 mg total) by mouth 2 (two) times daily. 180 tablet 3  . levothyroxine (SYNTHROID, LEVOTHROID) 125 MCG tablet Take 125 mcg by mouth daily before breakfast.    . metoprolol tartrate (LOPRESSOR) 50 MG tablet Take 1 tablet (50 mg total) by mouth 2 (two) times daily. (Patient taking differently: Take 50 mg by mouth 3 (three) times daily. ) 180 tablet 3  . pantoprazole (PROTONIX) 40 MG tablet Take 1 tablet (40 mg total) by mouth daily before breakfast. 30 tablet 5  . Probiotic Product (PROBIOTIC PO) Take 1 capsule by mouth daily.    . promethazine (PHENERGAN) 12.5 MG tablet Take 1 tablet (12.5 mg total) by mouth every 6 (six)  hours as needed for nausea or vomiting. 30 tablet 0   No current facility-administered medications for this encounter.    Facility-Administered Medications Ordered in Other Encounters  Medication Dose Route Frequency Provider Last Rate Last Dose  . hydrocortisone cream 1 % 1 application  1 application Topical TID PRN Satira Sark, MD        Allergies:   Review of patient's allergies indicates no known allergies.   Social History:  The patient  reports that she quit smoking about 40 years ago. Her smoking use  included Cigarettes. She has a 0.50 pack-year smoking history. She has never used smokeless tobacco. She reports that she does not drink alcohol or use drugs.   Family History:  The patient's  family history includes Breast cancer in her paternal aunt; Leukemia in her mother; Ovarian cancer (age of onset: 63) in her mother; Stroke (age of onset: 1) in her father.    ROS:  Please see the history of present illness.   All other systems are reviewed and negative.    PHYSICAL EXAM: VS:  BP 102/70 (BP Location: Left Arm, Patient Position: Sitting, Cuff Size: Large)   Pulse (!) 119   Ht 5' 7.5" (1.715 m)   Wt 227 lb (103 kg)   BMI 35.03 kg/m  , BMI Body mass index is 35.03 kg/m. GEN: Well nourished, well developed, in no acute distress  HEENT: normal  Neck: no JVD, carotid bruits, or masses Cardiac: iRRR; no murmurs, rubs, or gallops,no edema  Respiratory:  clear to auscultation bilaterally, normal work of breathing GI: soft, nontender, nondistended, + BS MS: no deformity or atrophy  Skin: warm and dry  Neuro:  Strength and sensation are intact Psych: euthymic mood, full affect  EKG:  EKG reveals afib   Recent Labs: 01/12/2016: Magnesium 2.2; TSH 1.986 03/08/2016: ALT 13 03/16/2016: BUN 14; Creatinine, Ser 0.74; Potassium 3.5; Sodium 140 05/03/2016: Hemoglobin 14.0; Platelets 108    Lipid Panel     Component Value Date/Time   CHOL 219 (H) 11/30/2009 1950   TRIG 69 11/30/2009 1950   HDL 56 11/30/2009 1950   CHOLHDL 3.9 Ratio 11/30/2009 1950   VLDL 14 11/30/2009 1950   LDLCALC 149 (H) 11/30/2009 1950     Wt Readings from Last 3 Encounters:  05/03/16 225 lb 15.5 oz (102.5 kg)  05/01/16 227 lb (103 kg)  04/26/16 226 lb 1.3 oz (102.6 kg)    ASSESSMENT AND PLAN:  1.  Persistent afib The patient has symptomatic persistent afib.  She has not converted to sinus rhythm with low dose flecainide.  I will increase flecainide to 100mg  BID at this time. Return to see me in the  [place] office on Friday.  If still in AF, will arrange cardioversion. Continue anticoagulation.  2. HTN Stable No change required today  3. Obesity Body mass index is 35.03 kg/m. Weight loss advised  4. CLL/ splenomegally Stable No change required today   Signed, Thompson Grayer, MD

## 2016-05-04 ENCOUNTER — Ambulatory Visit (INDEPENDENT_AMBULATORY_CARE_PROVIDER_SITE_OTHER): Payer: Medicare Other | Admitting: Internal Medicine

## 2016-05-04 ENCOUNTER — Encounter: Payer: Self-pay | Admitting: *Deleted

## 2016-05-04 ENCOUNTER — Encounter: Payer: Self-pay | Admitting: Internal Medicine

## 2016-05-04 VITALS — BP 112/70 | HR 85 | Ht 67.0 in | Wt 218.0 lb

## 2016-05-04 DIAGNOSIS — C9110 Chronic lymphocytic leukemia of B-cell type not having achieved remission: Secondary | ICD-10-CM | POA: Diagnosis not present

## 2016-05-04 DIAGNOSIS — I10 Essential (primary) hypertension: Secondary | ICD-10-CM

## 2016-05-04 DIAGNOSIS — I481 Persistent atrial fibrillation: Secondary | ICD-10-CM

## 2016-05-04 DIAGNOSIS — I4819 Other persistent atrial fibrillation: Secondary | ICD-10-CM

## 2016-05-04 NOTE — Progress Notes (Signed)
Electrophysiology Office Note   Date:  05/04/2016   ID:  Daisy Black, [place] [birth year], MRN [id number]  PCP:  Renee Rival, NP  Cardiologist:  Dr Domenic Polite Primary Electrophysiologist: Thompson Grayer, MD    CC: Afib   History of Present Illness: Daisy Black is a 65 y.o. female who presents today for electrophysiology follow-up.   She has started flecainide but remains in afib.  She reports compliance with eliquis.  She received chemotherapy yesterday and feels pretty miserable today.  She report having tachy palpitations last night for which she took additional diltiazem. Today, she denies symptoms of chest pain, shortness of breath, orthopnea, PND, lower extremity edema, claudication, dizziness, presyncope, syncope, bleeding, or neurologic sequela. The patient is tolerating medications without difficulties and is otherwise without complaint today.    Past Medical History:  Diagnosis Date  . Arthritis   . Atrial fibrillation ([place])    Atrial fibrillation - onset in 09/2009; DC cardioversion in 10/2009  . CLL (chronic lymphocytic leukemia) ([place])   . Essential hypertension   . GERD (gastroesophageal reflux disease)   . History of hiatal hernia   . Hyperlipidemia   . Lymphocytosis   . Obesity   . Spleen enlarged   . TSH elevation    Past Surgical History:  Procedure Laterality Date  . ANKLE SURGERY Right   . CARDIOVERSION N/A 03/20/2016   Procedure: CARDIOVERSION;  Surgeon: Satira Sark, MD;  Location: [facility];  Service: Cardiovascular;  Laterality: N/A;  . CHOLECYSTECTOMY    . HERNIA REPAIR  2011   Incisional and umbilical utilizing mesh  . ROTATOR CUFF REPAIR    . TEE WITHOUT CARDIOVERSION N/A 01/17/2016   Procedure: TRANSESOPHAGEAL ECHOCARDIOGRAM (TEE);  Surgeon: Herminio Commons, MD;  Location: [facility];  Service: Cardiovascular;  Laterality: N/A;  . TEE WITHOUT CARDIOVERSION N/A 02/21/2016   Procedure: TRANSESOPHAGEAL ECHOCARDIOGRAM (TEE) WITH PROPOFOL;   Surgeon: Herminio Commons, MD;  Location: [facility];  Service: Cardiovascular;  Laterality: N/A;  . TEE WITHOUT CARDIOVERSION N/A 03/20/2016   Procedure: TRANSESOPHAGEAL ECHOCARDIOGRAM (TEE) WITH PROPOFOL;  Surgeon: Satira Sark, MD;  Location: [facility];  Service: Cardiovascular;  Laterality: N/A;  . TONSILLECTOMY       Current Outpatient Prescriptions  Medication Sig Dispense Refill  . acetaminophen (TYLENOL) 650 MG CR tablet Take 650 mg by mouth every 8 (eight) hours as needed for pain.     Marland Kitchen apixaban (ELIQUIS) 5 MG TABS tablet Take 1 tablet (5 mg total) by mouth 2 (two) times daily. 60 tablet 2  . diltiazem (CARDIZEM) 60 MG tablet Take 1 tablet (60 mg total) by mouth daily as needed.    . Ferrous Fumarate (IRON) 18 MG TBCR Take 1 tablet by mouth daily.     . flecainide (TAMBOCOR) 50 MG tablet Take 2 tablets (100 mg total) by mouth 2 (two) times daily. 180 tablet 3  . levothyroxine (SYNTHROID, LEVOTHROID) 125 MCG tablet Take 125 mcg by mouth daily before breakfast.    . metoprolol tartrate (LOPRESSOR) 50 MG tablet Take 1 tablet (50 mg total) by mouth 2 (two) times daily. (Patient taking differently: Take 50 mg by mouth 3 (three) times daily. ) 180 tablet 3  . pantoprazole (PROTONIX) 40 MG tablet Take 1 tablet (40 mg total) by mouth daily before breakfast. 30 tablet 5  . Probiotic Product (PROBIOTIC PO) Take 1 capsule by mouth daily.    . promethazine (PHENERGAN) 12.5 MG tablet Take 1 tablet (12.5 mg total)  by mouth every 6 (six) hours as needed for nausea or vomiting. 30 tablet 0   No current facility-administered medications for this visit.    Facility-Administered Medications Ordered in Other Visits  Medication Dose Route Frequency Provider Last Rate Last Dose  . hydrocortisone cream 1 % 1 application  1 application Topical TID PRN Satira Sark, MD        Allergies:   Review of patient's allergies indicates no known allergies.   Social History:  The patient  reports that she  quit smoking about 40 years ago. Her smoking use included Cigarettes. She has a 0.50 pack-year smoking history. She has never used smokeless tobacco. She reports that she does not drink alcohol or use drugs.   Family History:  The patient's  family history includes Breast cancer in her paternal aunt; Leukemia in her mother; Ovarian cancer (age of onset: 27) in her mother; Stroke (age of onset: 43) in her father.    ROS:  Please see the history of present illness.   All other systems are reviewed and negative.    PHYSICAL EXAM: VS:  BP 112/70   Pulse 85   Ht 5\' 7"  (1.702 m)   Wt 218 lb (98.9 kg)   SpO2 98%   BMI 34.14 kg/m  , BMI Body mass index is 34.14 kg/m. GEN: Well nourished, well developed, in no acute distress  HEENT: normal  Neck: no JVD, carotid bruits, or masses Cardiac: iRRR; no murmurs, rubs, or gallops,no edema  Respiratory:  clear to auscultation bilaterally, normal work of breathing GI: soft, nontender, nondistended, + BS MS: no deformity or atrophy  Skin: warm and dry  Neuro:  Strength and sensation are intact Psych: euthymic mood, full affect    Recent Labs: 01/12/2016: Magnesium 2.2; TSH 1.986 03/08/2016: ALT 13 03/16/2016: BUN 14; Creatinine, Ser 0.74; Potassium 3.5; Sodium 140 05/03/2016: Hemoglobin 14.0; Platelets 108    Lipid Panel     Component Value Date/Time   CHOL 219 (H) 11/30/2009 1950   TRIG 69 11/30/2009 1950   HDL 56 11/30/2009 1950   CHOLHDL 3.9 Ratio 11/30/2009 1950   VLDL 14 11/30/2009 1950   LDLCALC 149 (H) 11/30/2009 1950     Wt Readings from Last 3 Encounters:  05/04/16 218 lb (98.9 kg)  05/03/16 225 lb 15.5 oz (102.5 kg)  05/01/16 227 lb (103 kg)    ASSESSMENT AND PLAN:  1.  Persistent afib The patient has symptomatic persistent afib.  She has not converted to sinus rhythm with flecainide.  I would therefore advise cardioversion. Risks, benefits, and alternatives to cardioversion were discussed with the patient who wishes to  proceed.  We will try to arrange in [place] for early next week.  She reports compliance with eliquis without interruption. Continue anticoagulation.  2. HTN Stable No change required today  3. Obesity Body mass index is 34.14 kg/m. Weight loss advised  4. CLL/ splenomegally Stable No change required today  Return to see me in 3-4 weeks  Signed, Thompson Grayer, MD

## 2016-05-04 NOTE — Patient Instructions (Addendum)
Medication Instructions:  Continue all current medications.  Labwork: none  Testing/Procedures: Your physician has recommended that you have a Cardioversion (DCCV). Electrical Cardioversion uses a jolt of electricity to your heart either through paddles or wired patches attached to your chest. This is a controlled, usually prescheduled, procedure. Defibrillation is done under light anesthesia in the hospital, and you usually go home the day of the procedure. This is done to get your heart back into a normal rhythm. You are not awake for the procedure. Please see the instruction sheet given to you today.  Follow-Up: 06/01/2016  Any Other Special Instructions Will Be Listed Below (If Applicable).  If you need a refill on your cardiac medications before your next appointment, please call your pharmacy.

## 2016-05-07 ENCOUNTER — Telehealth: Payer: Self-pay | Admitting: *Deleted

## 2016-05-07 ENCOUNTER — Telehealth: Payer: Self-pay | Admitting: Cardiovascular Disease

## 2016-05-07 NOTE — Telephone Encounter (Signed)
Per daughter, on yesterday patient c/o having increased sob, lightheaded, and said she felt a heaviness in her chest rated 2-3/10. Per daughter, patient recently started flecainide and isn't sure if this is causing her sob. Per daughter BP 97/72 HR 108 O2 sat 98%. Daughter advised that patient needed to go to the ED for an evaluation. Daughter verbalized understanding of plan.

## 2016-05-07 NOTE — Telephone Encounter (Signed)
Wanted to know the status of scheduling cardioversion for her mother

## 2016-05-07 NOTE — Telephone Encounter (Signed)
Pt has now declined to have cardioversion at this time. Says she wants to wait until after last 2 chemo treatment are finished. Says she will call back to reschedule. Routed to [facility] - Dr. Aurelio Jew so they couldn't do tomorrow, but I got her scheduled for Wednesday 10/11 @ 1pm. She will need to register @ 11:30 @ [place]. Pre op scheduled for Tuesday 10/10 @ 8am @ [place].   Thanks,  Coralyn Mark   Previous Messages    ----- Message -----  From: Massie Maroon, CMA  Sent: 05/07/2016  9:46 AM  To: Orinda Kenner  Subject: FW: precert                      ----- Message -----  From: Laurine Blazer, LPN  Sent: [id number]  6:25 PM  To: Myer Peer, CMA  Subject: precert                      We are trying to get her scheduled for DCCV. I called Coralyn Mark this evening, but schedulers at [facility] had already left. Coralyn Mark said she would try to call [facility] Sauer first thing Monday morning.    Patient / Daughter Raquel Sarna) is asking for Monday - preop, Tuesday - DCCV - okayed by Dr. Raliegh Ip to do at 10:00 am on that morning. She does chemo on Thursday, so that would give a break on Wednesday to rest up. If they can't get this worked out, can try for Tuesday - preop & DCCV on that Wednesday, but then of course have to check with Dr. Raliegh Ip & his schedule in [place] on that day.    I will also have to send Dr. Rayann Heman note to do orders.    I am sorry, I know this is all very confusing. I am off on Monday. Please call me if need to.    Thanks,   Edd Fabian

## 2016-05-08 ENCOUNTER — Telehealth: Payer: Self-pay | Admitting: *Deleted

## 2016-05-08 ENCOUNTER — Inpatient Hospital Stay (HOSPITAL_COMMUNITY): Admission: RE | Admit: 2016-05-08 | Payer: Medicare Other | Source: Ambulatory Visit

## 2016-05-08 NOTE — Telephone Encounter (Signed)
-----   Message from Massie Maroon, [place] sent at 05/07/2016 11:27 AM EDT ----- Regarding: RE: precert Pt has now declined to have cardioversion at this time - says she wants to wait until after she finishes chemo treatments. Says she will call back after the last treatment to re schedule this.   [place] ----- Message ----- From: Orinda Kenner Sent: 05/07/2016  10:09 AM To: Herminio Commons, MD, Massie Maroon, CMA Subject: RE: precert                                    Okay so they couldn't do tomorrow, but I got her scheduled for Wednesday 10/11 @ 1pm. She will need to register @ 11:30 @ [facility]. Pre op scheduled for Tuesday 10/10 @ 8am @ [place].   Thanks, Coralyn Mark ----- Message ----- From: Massie Maroon, CMA Sent: 05/07/2016   9:46 AM To: Orinda Kenner Subject: FW: precert                                      ----- Message ----- From: Laurine Blazer, LPN Sent: [id number]   6:25 PM To: Myer Peer, CMA Subject: precert                                        We are trying to get her scheduled for DCCV.  I called Coralyn Mark this evening, but schedulers at [facility] had already left.  Coralyn Mark said she would try to call [facility] Sauer first thing Monday morning.    Patient / Daughter Raquel Sarna) is asking for Monday - preop, Tuesday - DCCV - okayed by Dr. Raliegh Ip to do at 10:00 am on that morning.  She does chemo on Thursday, so that would give a break on Wednesday to rest up.  If they can't get this worked out, can try for Tuesday - preop & DCCV on that Wednesday, but then of course have to check with Dr. Raliegh Ip & his schedule in [place] on that day.    I will also have to send Dr. Rayann Heman note to do orders.    I am sorry, I know this is all very confusing.  I am off on Monday.  Please call me if need to.    Thanks,   Edd Fabian

## 2016-05-09 ENCOUNTER — Encounter (HOSPITAL_COMMUNITY): Payer: Self-pay

## 2016-05-09 ENCOUNTER — Ambulatory Visit: Payer: Medicare Other | Admitting: Cardiovascular Disease

## 2016-05-09 ENCOUNTER — Ambulatory Visit (HOSPITAL_COMMUNITY): Admit: 2016-05-09 | Payer: Medicare Other | Admitting: Cardiovascular Disease

## 2016-05-09 SURGERY — CARDIOVERSION
Anesthesia: Monitor Anesthesia Care

## 2016-05-09 NOTE — Progress Notes (Signed)
[place]  Telephone:(336) [phone] Fax:(336) [phone]  ID: Daisy Black OB: 1951/03/21  MR#: [id number]  [id number]  Patient Care Team: Renee Rival, NP as PCP - General (Nurse Practitioner) Aviva Signs, MD (General Surgery) Rico Junker, RN as Registered Nurse Theodore Demark, RN as Registered Nurse Satira Sark, MD as Consulting Physician (Cardiology) Daneil Dolin, MD as Consulting Physician (Gastroenterology)  CHIEF COMPLAINT: CLL  INTERVAL HISTORY: Patient returns to clinic today for further evaluation and consideration of cycle 3 of weekly single agent Rituxan. She has occasional shortness of breath, but denies any chest pain.  Patient states her weakness and fatigue continue to improve. She no longer complains of early satiety, reflux, or nausea. She continues to be anxious. She has no fevers, chills, or night sweats. She denies any constipation or diarrhea. She has no melanoma or hematochezia. Patient offers no further specific complaints today.  REVIEW OF SYSTEMS:   Review of Systems  Constitutional: Positive for malaise/fatigue. Negative for diaphoresis, fever and weight loss.  Respiratory: Negative.  Negative for cough and shortness of breath.   Cardiovascular: Negative.  Negative for chest pain and leg swelling.  Gastrointestinal: Negative.  Negative for abdominal pain, blood in stool, constipation, diarrhea, heartburn, melena, nausea and vomiting.  Musculoskeletal: Negative.   Neurological: Positive for weakness.  Psychiatric/Behavioral: The patient is nervous/anxious.     As per HPI. Otherwise, a complete review of systems is negative.  PAST MEDICAL HISTORY: Past Medical History:  Diagnosis Date  . Arthritis   . Atrial fibrillation ([place])    Atrial fibrillation - onset in 09/2009; DC cardioversion in 10/2009  . CLL (chronic lymphocytic leukemia) ([place])   . Essential hypertension   . GERD (gastroesophageal reflux disease)     . History of hiatal hernia   . Hyperlipidemia   . Lymphocytosis   . Obesity   . Spleen enlarged   . TSH elevation     PAST SURGICAL HISTORY: Past Surgical History:  Procedure Laterality Date  . ANKLE SURGERY Right   . CARDIOVERSION N/A 03/20/2016   Procedure: CARDIOVERSION;  Surgeon: Satira Sark, MD;  Location: [facility];  Service: Cardiovascular;  Laterality: N/A;  . CHOLECYSTECTOMY    . HERNIA REPAIR  2011   Incisional and umbilical utilizing mesh  . ROTATOR CUFF REPAIR    . TEE WITHOUT CARDIOVERSION N/A 01/17/2016   Procedure: TRANSESOPHAGEAL ECHOCARDIOGRAM (TEE);  Surgeon: Herminio Commons, MD;  Location: [facility];  Service: Cardiovascular;  Laterality: N/A;  . TEE WITHOUT CARDIOVERSION N/A 02/21/2016   Procedure: TRANSESOPHAGEAL ECHOCARDIOGRAM (TEE) WITH PROPOFOL;  Surgeon: Herminio Commons, MD;  Location: [facility];  Service: Cardiovascular;  Laterality: N/A;  . TEE WITHOUT CARDIOVERSION N/A 03/20/2016   Procedure: TRANSESOPHAGEAL ECHOCARDIOGRAM (TEE) WITH PROPOFOL;  Surgeon: Satira Sark, MD;  Location: [facility];  Service: Cardiovascular;  Laterality: N/A;  . TONSILLECTOMY      FAMILY HISTORY Family History  Problem Relation Age of Onset  . Ovarian cancer Mother 31    Secondary to ovarian cancer  . Leukemia Mother   . Stroke Father 39    Brain stem infarction  . Breast cancer Paternal Aunt        ADVANCED DIRECTIVES:    HEALTH MAINTENANCE: Social History  Substance Use Topics  . Smoking status: Former Smoker    Packs/day: 0.25    Years: 2.00    Types: Cigarettes    Quit date: 08/03/1975  . Smokeless tobacco: Never Used  .  Alcohol use No     Colonoscopy:  PAP:  Bone density:  Lipid panel:  No Known Allergies  Current Outpatient Prescriptions  Medication Sig Dispense Refill  . acetaminophen (TYLENOL) 650 MG CR tablet Take 650 mg by mouth every 8 (eight) hours as needed for pain.     Marland Kitchen apixaban (ELIQUIS) 5 MG TABS tablet Take 1 tablet (5 mg  total) by mouth 2 (two) times daily. 60 tablet 2  . diltiazem (CARDIZEM) 60 MG tablet Take 1 tablet (60 mg total) by mouth daily as needed.    . Ferrous Fumarate (IRON) 18 MG TBCR Take 1 tablet by mouth daily.     . flecainide (TAMBOCOR) 50 MG tablet Take 2 tablets (100 mg total) by mouth 2 (two) times daily. 180 tablet 3  . levothyroxine (SYNTHROID, LEVOTHROID) 125 MCG tablet Take 125 mcg by mouth daily before breakfast.    . metoprolol tartrate (LOPRESSOR) 50 MG tablet Take 1 tablet (50 mg total) by mouth 2 (two) times daily. (Patient taking differently: Take 50 mg by mouth 3 (three) times daily. ) 180 tablet 3  . pantoprazole (PROTONIX) 40 MG tablet Take 1 tablet (40 mg total) by mouth daily before breakfast. 30 tablet 5  . Probiotic Product (PROBIOTIC PO) Take 1 capsule by mouth daily.    . promethazine (PHENERGAN) 12.5 MG tablet Take 1 tablet (12.5 mg total) by mouth every 6 (six) hours as needed for nausea or vomiting. 30 tablet 0   No current facility-administered medications for this visit.    Facility-Administered Medications Ordered in Other Visits  Medication Dose Route Frequency Provider Last Rate Last Dose  . hydrocortisone cream 1 % 1 application  1 application Topical TID PRN Satira Sark, MD        OBJECTIVE: Vitals:   05/10/16 0912  BP: 99/61  Pulse: 85  Resp: 18  Temp: 97.4 F (36.3 C)     Body mass index is 36.17 kg/m.    ECOG FS:0 - Asymptomatic  General: Well-developed, well-nourished, no acute distress. Eyes: Pink conjunctiva, anicteric sclera. HEENT: Minimally palpable cervical lymph nodes. Lungs: Clear to auscultation bilaterally. Heart: Regular rate and rhythm. No rubs, murmurs, or gallops. Abdomen: Soft, nontender, nondistended. Splenomegaly noted, normoactive bowel sounds. Musculoskeletal: No edema, cyanosis, or clubbing. Neuro: Alert, answering all questions appropriately. Cranial nerves grossly intact. Skin: No rashes or petechiae noted. Psych:  Normal affect.  LAB RESULTS:  Lab Results  Component Value Date   NA 140 03/16/2016   K 3.5 03/16/2016   CL 116 (H) 03/16/2016   CO2 24 03/16/2016   GLUCOSE 114 (H) 03/16/2016   BUN 14 03/16/2016   CREATININE 0.74 03/16/2016   CALCIUM 8.9 03/16/2016   PROT 6.4 (L) 03/08/2016   ALBUMIN 4.0 03/08/2016   AST 21 03/08/2016   ALT 13 (L) 03/08/2016   ALKPHOS 88 03/08/2016   BILITOT 0.8 03/08/2016   GFRNONAA >60 03/16/2016   GFRAA >60 03/16/2016    Lab Results  Component Value Date   WBC 21.3 (H) 05/10/2016   NEUTROABS 4.7 05/10/2016   HGB 13.7 05/10/2016   HCT 41.6 05/10/2016   MCV 91.8 05/10/2016   PLT 113 (L) 05/10/2016     STUDIES: No results found.  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  PLAN:    1. CLL: PET scan results from March 22, 2016 reviewed independently with likely progression of disease with increasing lymphadenopathy as well as splenomegaly. Biopsy was not pursued given  the difficulty of the procedure. Will consider biopsy in the future given the concern for possible transformation to a more aggressive type of lymphoma. Patient's white blood cell count is trending down and she is symptomatically improved. Proceed with cycle 3 of 4 of weekly Rituxan today. Return to clinic in 1 week for consideration of cycle 4. Will reimage approximately 8 weeks after the conclusion of cycle 4. 2. Early satiety, nausea, vomiting: Improving. Likely related to patient's massive splenomegaly. 3. Atrial fibrillation: Patient states she has possible cardioversion in the near future which she has been instructed to pursue in parallel with her oncology appointments. 4. Iron deficiency anemia: Patient's hemoglobin is within normal limits. She last received IV Feraheme in May 2017.  5. PET positive thyroid lesions: Will consider ultrasound-guided biopsy of the near future. 6. Shortness breath: Likely secondary to paroxysmal atrial fibrillation.  Treatment and monitoring per cardiology.  Patient expressed understanding and was in agreement with this plan. She also understands that She can call clinic at any time with any questions, concerns, or complaints.     Lloyd Huger, MD   05/14/2016 9:20 AM

## 2016-05-10 ENCOUNTER — Inpatient Hospital Stay: Payer: Medicare Other

## 2016-05-10 ENCOUNTER — Inpatient Hospital Stay (HOSPITAL_BASED_OUTPATIENT_CLINIC_OR_DEPARTMENT_OTHER): Payer: Medicare Other | Admitting: Oncology

## 2016-05-10 VITALS — BP 99/61 | HR 85 | Temp 97.4°F | Resp 18 | Wt 230.9 lb

## 2016-05-10 DIAGNOSIS — Z79899 Other long term (current) drug therapy: Secondary | ICD-10-CM

## 2016-05-10 DIAGNOSIS — R161 Splenomegaly, not elsewhere classified: Secondary | ICD-10-CM | POA: Diagnosis not present

## 2016-05-10 DIAGNOSIS — Z5112 Encounter for antineoplastic immunotherapy: Secondary | ICD-10-CM | POA: Diagnosis not present

## 2016-05-10 DIAGNOSIS — D509 Iron deficiency anemia, unspecified: Secondary | ICD-10-CM

## 2016-05-10 DIAGNOSIS — E785 Hyperlipidemia, unspecified: Secondary | ICD-10-CM

## 2016-05-10 DIAGNOSIS — C9110 Chronic lymphocytic leukemia of B-cell type not having achieved remission: Secondary | ICD-10-CM

## 2016-05-10 DIAGNOSIS — R531 Weakness: Secondary | ICD-10-CM

## 2016-05-10 DIAGNOSIS — R5381 Other malaise: Secondary | ICD-10-CM

## 2016-05-10 DIAGNOSIS — E079 Disorder of thyroid, unspecified: Secondary | ICD-10-CM | POA: Diagnosis not present

## 2016-05-10 DIAGNOSIS — F419 Anxiety disorder, unspecified: Secondary | ICD-10-CM

## 2016-05-10 DIAGNOSIS — R6881 Early satiety: Secondary | ICD-10-CM

## 2016-05-10 DIAGNOSIS — R112 Nausea with vomiting, unspecified: Secondary | ICD-10-CM

## 2016-05-10 DIAGNOSIS — I4891 Unspecified atrial fibrillation: Secondary | ICD-10-CM

## 2016-05-10 DIAGNOSIS — K219 Gastro-esophageal reflux disease without esophagitis: Secondary | ICD-10-CM

## 2016-05-10 DIAGNOSIS — Z87891 Personal history of nicotine dependence: Secondary | ICD-10-CM

## 2016-05-10 DIAGNOSIS — I10 Essential (primary) hypertension: Secondary | ICD-10-CM

## 2016-05-10 DIAGNOSIS — M1990 Unspecified osteoarthritis, unspecified site: Secondary | ICD-10-CM

## 2016-05-10 DIAGNOSIS — R5383 Other fatigue: Secondary | ICD-10-CM

## 2016-05-10 LAB — CBC WITH DIFFERENTIAL/PLATELET
BASOS ABS: 0 10*3/uL (ref 0–0.1)
BASOS PCT: 0 %
EOS PCT: 1 %
Eosinophils Absolute: 0.2 10*3/uL (ref 0–0.7)
HCT: 41.6 % (ref 35.0–47.0)
Hemoglobin: 13.7 g/dL (ref 12.0–16.0)
LYMPHS PCT: 74 %
Lymphs Abs: 15.8 10*3/uL — ABNORMAL HIGH (ref 1.0–3.6)
MCH: 30.2 pg (ref 26.0–34.0)
MCHC: 32.9 g/dL (ref 32.0–36.0)
MCV: 91.8 fL (ref 80.0–100.0)
Monocytes Absolute: 0.5 10*3/uL (ref 0.2–0.9)
Monocytes Relative: 3 %
NEUTROS ABS: 4.7 10*3/uL (ref 1.4–6.5)
Neutrophils Relative %: 22 %
PLATELETS: 113 10*3/uL — AB (ref 150–440)
RBC: 4.54 MIL/uL (ref 3.80–5.20)
RDW: 16.6 % — ABNORMAL HIGH (ref 11.5–14.5)
WBC: 21.3 10*3/uL — AB (ref 3.6–11.0)

## 2016-05-10 MED ORDER — SODIUM CHLORIDE 0.9 % IV SOLN
Freq: Once | INTRAVENOUS | Status: AC
  Administered 2016-05-10: 10:00:00 via INTRAVENOUS
  Filled 2016-05-10: qty 1000

## 2016-05-10 MED ORDER — DIPHENHYDRAMINE HCL 25 MG PO CAPS
25.0000 mg | ORAL_CAPSULE | Freq: Once | ORAL | Status: AC
  Administered 2016-05-10: 25 mg via ORAL
  Filled 2016-05-10: qty 1

## 2016-05-10 MED ORDER — SODIUM CHLORIDE 0.9 % IV SOLN: 375.0000 mg/m2 | Freq: Once | INTRAVENOUS | Status: DC

## 2016-05-10 MED ORDER — SODIUM CHLORIDE 0.9 % IV SOLN: Freq: Once | INTRAVENOUS | Status: DC

## 2016-05-10 MED ORDER — ONDANSETRON HCL 4 MG/2ML IJ SOLN
8.0000 mg | Freq: Once | INTRAMUSCULAR | Status: AC
  Administered 2016-05-10: 8 mg via INTRAVENOUS
  Filled 2016-05-10: qty 4

## 2016-05-10 MED ORDER — SODIUM CHLORIDE 0.9 % IV SOLN
375.0000 mg/m2 | Freq: Once | INTRAVENOUS | Status: AC
Start: 2016-05-10 — End: 2016-05-10
  Administered 2016-05-10: 800 mg via INTRAVENOUS
  Filled 2016-05-10: qty 50

## 2016-05-10 MED ORDER — ACETAMINOPHEN 325 MG PO TABS
650.0000 mg | ORAL_TABLET | Freq: Once | ORAL | Status: AC
  Administered 2016-05-10: 650 mg via ORAL
  Filled 2016-05-10: qty 2

## 2016-05-10 MED ORDER — DEXAMETHASONE SODIUM PHOSPHATE 10 MG/ML IJ SOLN
10.0000 mg | Freq: Once | INTRAMUSCULAR | Status: AC
  Administered 2016-05-10: 10 mg via INTRAVENOUS
  Filled 2016-05-10: qty 1

## 2016-05-10 MED ORDER — FAMOTIDINE IN NACL 20-0.9 MG/50ML-% IV SOLN
20.0000 mg | Freq: Two times a day (BID) | INTRAVENOUS | Status: DC
  Administered 2016-05-10: 20 mg via INTRAVENOUS
  Filled 2016-05-10: qty 50

## 2016-05-10 MED ORDER — LORAZEPAM 2 MG/ML IJ SOLN
1.0000 mg | Freq: Once | INTRAMUSCULAR | Status: AC
  Administered 2016-05-10: 1 mg via INTRAVENOUS
  Filled 2016-05-10: qty 1

## 2016-05-10 NOTE — Progress Notes (Signed)
States has occasional shortness of breath and tingling in lips.

## 2016-05-16 NOTE — Progress Notes (Signed)
[place]  Telephone:(336) [phone] Fax:(336) [phone]  ID: Daisy Black OB: Dec 23, 1950  MR#: [id number]  [id number]  Patient Care Team: Renee Rival, NP as PCP - General (Nurse Practitioner) Aviva Signs, MD (General Surgery) Rico Junker, RN as Registered Nurse Theodore Demark, RN as Registered Nurse Satira Sark, MD as Consulting Physician (Cardiology) Daneil Dolin, MD as Consulting Physician (Gastroenterology)  CHIEF COMPLAINT: CLL  INTERVAL HISTORY: Patient returns to clinic today for further evaluation and consideration of cycle 4 of weekly single agent Rituxan. She currently feels well and is asymptomatic. She does not complain of shortness of breath or chest pain this week. Patient states her weakness and fatigue continue to improve. She no longer complains of early satiety, reflux, or nausea. She continues to be anxious. She has no fevers, chills, or night sweats. She denies any constipation or diarrhea. She has no melanoma or hematochezia. Patient offers no further specific complaints today.  REVIEW OF SYSTEMS:   Review of Systems  Constitutional: Positive for malaise/fatigue. Negative for diaphoresis, fever and weight loss.  Respiratory: Negative.  Negative for cough and shortness of breath.   Cardiovascular: Negative.  Negative for chest pain and leg swelling.  Gastrointestinal: Negative.  Negative for abdominal pain, blood in stool, constipation, diarrhea, heartburn, melena, nausea and vomiting.  Musculoskeletal: Negative.   Neurological: Positive for weakness.  Psychiatric/Behavioral: The patient is nervous/anxious.     As per HPI. Otherwise, a complete review of systems is negative.  PAST MEDICAL HISTORY: Past Medical History:  Diagnosis Date  . Arthritis   . Atrial fibrillation ([place])    Atrial fibrillation - onset in 09/2009; DC cardioversion in 10/2009  . CLL (chronic lymphocytic leukemia) ([place])   . Essential hypertension    . GERD (gastroesophageal reflux disease)   . History of hiatal hernia   . Hyperlipidemia   . Lymphocytosis   . Obesity   . Spleen enlarged   . TSH elevation     PAST SURGICAL HISTORY: Past Surgical History:  Procedure Laterality Date  . ANKLE SURGERY Right   . CARDIOVERSION N/A 03/20/2016   Procedure: CARDIOVERSION;  Surgeon: Satira Sark, MD;  Location: [facility];  Service: Cardiovascular;  Laterality: N/A;  . CHOLECYSTECTOMY    . HERNIA REPAIR  2011   Incisional and umbilical utilizing mesh  . ROTATOR CUFF REPAIR    . TEE WITHOUT CARDIOVERSION N/A 01/17/2016   Procedure: TRANSESOPHAGEAL ECHOCARDIOGRAM (TEE);  Surgeon: Herminio Commons, MD;  Location: [facility];  Service: Cardiovascular;  Laterality: N/A;  . TEE WITHOUT CARDIOVERSION N/A 02/21/2016   Procedure: TRANSESOPHAGEAL ECHOCARDIOGRAM (TEE) WITH PROPOFOL;  Surgeon: Herminio Commons, MD;  Location: [facility];  Service: Cardiovascular;  Laterality: N/A;  . TEE WITHOUT CARDIOVERSION N/A 03/20/2016   Procedure: TRANSESOPHAGEAL ECHOCARDIOGRAM (TEE) WITH PROPOFOL;  Surgeon: Satira Sark, MD;  Location: [facility];  Service: Cardiovascular;  Laterality: N/A;  . TONSILLECTOMY      FAMILY HISTORY Family History  Problem Relation Age of Onset  . Ovarian cancer Mother 81    Secondary to ovarian cancer  . Leukemia Mother   . Stroke Father 65    Brain stem infarction  . Breast cancer Paternal Aunt        ADVANCED DIRECTIVES:    HEALTH MAINTENANCE: Social History  Substance Use Topics  . Smoking status: Former Smoker    Packs/day: 0.25    Years: 2.00    Types: Cigarettes    Quit date: 08/03/1975  .  Smokeless tobacco: Never Used  . Alcohol use No     Colonoscopy:  PAP:  Bone density:  Lipid panel:  No Known Allergies  Current Outpatient Prescriptions  Medication Sig Dispense Refill  . acetaminophen (TYLENOL) 650 MG CR tablet Take 650 mg by mouth every 8 (eight) hours as needed for pain.     Marland Kitchen apixaban  (ELIQUIS) 5 MG TABS tablet Take 1 tablet (5 mg total) by mouth 2 (two) times daily. 60 tablet 2  . diltiazem (CARDIZEM) 60 MG tablet Take 1 tablet (60 mg total) by mouth daily as needed. (Patient taking differently: Take 60 mg by mouth 3 (three) times daily. )    . Ferrous Fumarate (IRON) 18 MG TBCR Take 1 tablet by mouth daily.     . flecainide (TAMBOCOR) 50 MG tablet Take 2 tablets (100 mg total) by mouth 2 (two) times daily. 180 tablet 3  . levothyroxine (SYNTHROID, LEVOTHROID) 125 MCG tablet Take 125 mcg by mouth daily before breakfast.    . metoprolol tartrate (LOPRESSOR) 50 MG tablet Take 1 tablet (50 mg total) by mouth 2 (two) times daily. 180 tablet 3  . pantoprazole (PROTONIX) 40 MG tablet Take 1 tablet (40 mg total) by mouth daily before breakfast. 30 tablet 5  . Probiotic Product (PROBIOTIC PO) Take 1 capsule by mouth daily.    . promethazine (PHENERGAN) 12.5 MG tablet Take 1 tablet (12.5 mg total) by mouth every 6 (six) hours as needed for nausea or vomiting. 30 tablet 0  . cetirizine (ZYRTEC) 10 MG tablet Take 10 mg by mouth daily as needed for allergies.    . fluticasone (FLONASE) 50 MCG/ACT nasal spray Place 1 spray into both nostrils daily as needed for allergies or rhinitis.     No current facility-administered medications for this visit.    Facility-Administered Medications Ordered in Other Visits  Medication Dose Route Frequency Provider Last Rate Last Dose  . hydrocortisone cream 1 % 1 application  1 application Topical TID PRN Satira Sark, MD        OBJECTIVE: Vitals:   05/17/16 0939  BP: 115/65  Pulse: 74  Resp: 18  Temp: 98.1 F (36.7 C)     Body mass index is 36.83 kg/m.    ECOG FS:0 - Asymptomatic  General: Well-developed, well-nourished, no acute distress. Eyes: Pink conjunctiva, anicteric sclera. HEENT: Cervical lymph nodes are no longer palpable. Lungs: Clear to auscultation bilaterally. Heart: Regular rate and rhythm. No rubs, murmurs, or  gallops. Abdomen: Soft, nontender, nondistended. Splenomegaly noted, normoactive bowel sounds. Musculoskeletal: No edema, cyanosis, or clubbing. Neuro: Alert, answering all questions appropriately. Cranial nerves grossly intact. Skin: No rashes or petechiae noted. Psych: Normal affect.  LAB RESULTS:  Lab Results  Component Value Date   NA 140 03/16/2016   K 3.5 03/16/2016   CL 116 (H) 03/16/2016   CO2 24 03/16/2016   GLUCOSE 114 (H) 03/16/2016   BUN 14 03/16/2016   CREATININE 0.74 03/16/2016   CALCIUM 8.9 03/16/2016   PROT 6.4 (L) 03/08/2016   ALBUMIN 4.0 03/08/2016   AST 21 03/08/2016   ALT 13 (L) 03/08/2016   ALKPHOS 88 03/08/2016   BILITOT 0.8 03/08/2016   GFRNONAA >60 03/16/2016   GFRAA >60 03/16/2016    Lab Results  Component Value Date   WBC 18.4 (H) 05/17/2016   NEUTROABS 4.7 05/17/2016   HGB 13.2 05/17/2016   HCT 40.3 05/17/2016   MCV 91.9 05/17/2016   PLT 116 (L) 05/17/2016  STUDIES: No results found.  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  PLAN:    1. CLL: PET scan results from March 22, 2016 reviewed independently with likely progression of disease with increasing lymphadenopathy as well as splenomegaly. Biopsy was not pursued given the difficulty of the procedure. Will consider biopsy in the future given the concern for possible transformation to a more aggressive type of lymphoma. Patient's white blood cell count is trending down and she is symptomatically improved. Proceed with cycle 4 of 4 of weekly Rituxan today. Return to clinic in 8 weeks for further evaluation and repeat imaging. If residual disease, we will consider biopsy at that point. 2. Early satiety, nausea, vomiting: Improving. Likely related to patient's massive splenomegaly. 3. Atrial fibrillation: Patient states she has possible cardioversion in the near future which she has been instructed to pursue in parallel with her oncology appointments. 4.  Iron deficiency anemia: Patient's hemoglobin is within normal limits. She last received IV Feraheme in May 2017.  5. PET positive thyroid lesions: If lesions are not resolved, will consider ultrasound-guided biopsy of the near future. 6. Shortness breath: Likely secondary to paroxysmal atrial fibrillation. Treatment and monitoring per cardiology.  Patient expressed understanding and was in agreement with this plan. She also understands that She can call clinic at any time with any questions, concerns, or complaints.     Lloyd Huger, MD   05/20/2016 9:51 AM

## 2016-05-17 ENCOUNTER — Inpatient Hospital Stay: Payer: Medicare Other

## 2016-05-17 ENCOUNTER — Encounter: Payer: Self-pay | Admitting: *Deleted

## 2016-05-17 ENCOUNTER — Telehealth: Payer: Self-pay | Admitting: *Deleted

## 2016-05-17 ENCOUNTER — Other Ambulatory Visit: Payer: Self-pay

## 2016-05-17 ENCOUNTER — Inpatient Hospital Stay (HOSPITAL_BASED_OUTPATIENT_CLINIC_OR_DEPARTMENT_OTHER): Payer: Medicare Other | Admitting: Oncology

## 2016-05-17 VITALS — BP 115/65 | HR 74 | Temp 98.1°F | Resp 18 | Wt 235.1 lb

## 2016-05-17 DIAGNOSIS — R161 Splenomegaly, not elsewhere classified: Secondary | ICD-10-CM

## 2016-05-17 DIAGNOSIS — Z87891 Personal history of nicotine dependence: Secondary | ICD-10-CM

## 2016-05-17 DIAGNOSIS — C9110 Chronic lymphocytic leukemia of B-cell type not having achieved remission: Secondary | ICD-10-CM

## 2016-05-17 DIAGNOSIS — I10 Essential (primary) hypertension: Secondary | ICD-10-CM

## 2016-05-17 DIAGNOSIS — R6881 Early satiety: Secondary | ICD-10-CM

## 2016-05-17 DIAGNOSIS — Z7901 Long term (current) use of anticoagulants: Secondary | ICD-10-CM

## 2016-05-17 DIAGNOSIS — I480 Paroxysmal atrial fibrillation: Secondary | ICD-10-CM

## 2016-05-17 DIAGNOSIS — K219 Gastro-esophageal reflux disease without esophagitis: Secondary | ICD-10-CM

## 2016-05-17 DIAGNOSIS — E785 Hyperlipidemia, unspecified: Secondary | ICD-10-CM

## 2016-05-17 DIAGNOSIS — Z79899 Other long term (current) drug therapy: Secondary | ICD-10-CM

## 2016-05-17 DIAGNOSIS — R5383 Other fatigue: Secondary | ICD-10-CM

## 2016-05-17 DIAGNOSIS — F419 Anxiety disorder, unspecified: Secondary | ICD-10-CM

## 2016-05-17 DIAGNOSIS — Z5112 Encounter for antineoplastic immunotherapy: Secondary | ICD-10-CM | POA: Diagnosis not present

## 2016-05-17 DIAGNOSIS — E079 Disorder of thyroid, unspecified: Secondary | ICD-10-CM | POA: Diagnosis not present

## 2016-05-17 DIAGNOSIS — R112 Nausea with vomiting, unspecified: Secondary | ICD-10-CM

## 2016-05-17 DIAGNOSIS — D509 Iron deficiency anemia, unspecified: Secondary | ICD-10-CM

## 2016-05-17 DIAGNOSIS — R5381 Other malaise: Secondary | ICD-10-CM

## 2016-05-17 DIAGNOSIS — M1990 Unspecified osteoarthritis, unspecified site: Secondary | ICD-10-CM

## 2016-05-17 DIAGNOSIS — R531 Weakness: Secondary | ICD-10-CM

## 2016-05-17 LAB — CBC WITH DIFFERENTIAL/PLATELET
Basophils Absolute: 0.1 10*3/uL (ref 0–0.1)
Basophils Relative: 0 %
Eosinophils Absolute: 0.3 10*3/uL (ref 0–0.7)
Eosinophils Relative: 2 %
HEMATOCRIT: 40.3 % (ref 35.0–47.0)
HEMOGLOBIN: 13.2 g/dL (ref 12.0–16.0)
LYMPHS ABS: 12.8 10*3/uL — AB (ref 1.0–3.6)
Lymphocytes Relative: 69 %
MCH: 30.2 pg (ref 26.0–34.0)
MCHC: 32.9 g/dL (ref 32.0–36.0)
MCV: 91.9 fL (ref 80.0–100.0)
MONOS PCT: 3 %
Monocytes Absolute: 0.5 10*3/uL (ref 0.2–0.9)
NEUTROS ABS: 4.7 10*3/uL (ref 1.4–6.5)
NEUTROS PCT: 26 %
Platelets: 116 10*3/uL — ABNORMAL LOW (ref 150–440)
RBC: 4.38 MIL/uL (ref 3.80–5.20)
RDW: 16.8 % — ABNORMAL HIGH (ref 11.5–14.5)
WBC: 18.4 10*3/uL — AB (ref 3.6–11.0)

## 2016-05-17 MED ORDER — DIPHENHYDRAMINE HCL 25 MG PO CAPS
25.0000 mg | ORAL_CAPSULE | Freq: Once | ORAL | Status: AC
  Administered 2016-05-17: 25 mg via ORAL
  Filled 2016-05-17: qty 1

## 2016-05-17 MED ORDER — SODIUM CHLORIDE 0.9 % IV SOLN
375.0000 mg/m2 | Freq: Once | INTRAVENOUS | Status: AC
  Administered 2016-05-17: 800 mg via INTRAVENOUS
  Filled 2016-05-17: qty 50

## 2016-05-17 MED ORDER — SODIUM CHLORIDE 0.9 % IV SOLN
Freq: Once | INTRAVENOUS | Status: AC
  Administered 2016-05-17: 11:00:00 via INTRAVENOUS
  Filled 2016-05-17: qty 1000

## 2016-05-17 MED ORDER — SODIUM CHLORIDE 0.9 % IV SOLN: Freq: Once | INTRAVENOUS | Status: DC

## 2016-05-17 MED ORDER — ONDANSETRON 8 MG PO TBDP
8.0000 mg | ORAL_TABLET | Freq: Once | ORAL | Status: AC
  Administered 2016-05-17: 8 mg via ORAL
  Filled 2016-05-17: qty 1

## 2016-05-17 MED ORDER — FAMOTIDINE IN NACL 20-0.9 MG/50ML-% IV SOLN
20.0000 mg | Freq: Two times a day (BID) | INTRAVENOUS | Status: DC
  Administered 2016-05-17: 20 mg via INTRAVENOUS
  Filled 2016-05-17: qty 50

## 2016-05-17 MED ORDER — LORAZEPAM 2 MG/ML IJ SOLN
1.0000 mg | Freq: Once | INTRAMUSCULAR | Status: AC
  Administered 2016-05-17: 1 mg via INTRAVENOUS
  Filled 2016-05-17: qty 1

## 2016-05-17 MED ORDER — DEXAMETHASONE SODIUM PHOSPHATE 10 MG/ML IJ SOLN
10.0000 mg | Freq: Once | INTRAMUSCULAR | Status: AC
  Administered 2016-05-17: 10 mg via INTRAVENOUS
  Filled 2016-05-17: qty 1

## 2016-05-17 MED ORDER — ACETAMINOPHEN 325 MG PO TABS
650.0000 mg | ORAL_TABLET | Freq: Once | ORAL | Status: AC
  Administered 2016-05-17: 650 mg via ORAL
  Filled 2016-05-17: qty 2

## 2016-05-17 MED ORDER — SODIUM CHLORIDE 0.9 % IV SOLN: 375.0000 mg/m2 | Freq: Once | INTRAVENOUS | Status: DC

## 2016-05-17 NOTE — Telephone Encounter (Signed)
DCCV scheduled for Tuesday, 05/22/2016 at 9:00 am with Dr. Domenic Polite.  Patient to arrive at 7:30 am at short stay at [facility] day scheduled for Monday, 05/21/2016 at 8:15 am.    Daughter Raquel Sarna) notified.  Also, give her instructions for mom to have nothing to eat or drink after 12 midnight prior to procedure.  She can take all of her medications morning of with sip of water.    Will forward to [facility]) for pre-cert & Dr. Rayann Heman for him to do the orders.

## 2016-05-17 NOTE — Progress Notes (Signed)
States is feeling well. Appetite has improved and stomach is feeling better per patient.

## 2016-05-17 NOTE — Patient Instructions (Signed)
Daisy Black  05/17/2016     @PREFPERIOPPHARMACY @   Your procedure is scheduled on  05/22/2016  Report to [facility] at  730  A.M.  Call this number if you have problems the morning of surgery:  [phone]   Remember:  Do not eat food or drink liquids after midnight.  Take these medicines the morning of surgery with A SIP OF WATER  Continue on your eliquis, cardiazem, tambocor, synthroid, metoprolol, protonix, phenergan (if needed).   Do not wear jewelry, make-up or nail polish.  Do not wear lotions, powders, or perfumes, or deoderant.  Do not shave 48 hours prior to surgery.  Men may shave face and neck.  Do not bring valuables to the hospital.  [facility] is not responsible for any belongings or valuables.  Contacts, dentures or bridgework may not be worn into surgery.  Leave your suitcase in the car.  After surgery it may be brought to your room.  For patients admitted to the hospital, discharge time will be determined by your treatment team.  Patients discharged the day of surgery will not be allowed to drive home.   Name and phone number of your driver:   family Special instructions:  none  Please read over the following fact sheets that you were given. Anesthesia Post-op Instructions and Care and Recovery After Surgery       Electrical Cardioversion Electrical cardioversion is the delivery of a jolt of electricity to change the rhythm of the heart. Sticky patches or metal paddles are placed on the chest to deliver the electricity from a device. This is done to restore a normal rhythm. A rhythm that is too fast or not regular keeps the heart from pumping well. Electrical cardioversion is done in an emergency if:   There is low or no blood pressure as a result of the heart rhythm.   Normal rhythm must be restored as fast as possible to protect the brain and heart from further damage.   It may save a life. Cardioversion may be done for  heart rhythms that are not immediately life threatening, such as atrial fibrillation or flutter, in which:   The heart is beating too fast or is not regular.   Medicine to change the rhythm has not worked.   It is safe to wait in order to allow time for preparation.  Symptoms of the abnormal rhythm are bothersome.  The risk of stroke and other serious problems can be reduced. LET [facility] CARE PROVIDER KNOW ABOUT:   Any allergies you have.  All medicines you are taking, including vitamins, herbs, eye drops, creams, and over-the-counter medicines.  Previous problems you or members of your family have had with the use of anesthetics.   Any blood disorders you have.   Previous surgeries you have had.   Medical conditions you have. RISKS AND COMPLICATIONS  Generally, this is a safe procedure. However, problems can occur and include:   Breathing problems related to the anesthetic used.  A blood clot that breaks free and travels to other parts of your body. This could cause a stroke or other problems. The risk of this is lowered by use of blood-thinning medicine (anticoagulant) prior to the procedure.  Cardiac arrest (rare). BEFORE THE PROCEDURE   You may have tests to detect blood clots in your heart and to evaluate heart function.  You may start taking anticoagulants so your blood does not  clot as easily.   Medicines may be given to help stabilize your heart rate and rhythm. PROCEDURE  You will be given medicine through an IV tube to reduce discomfort and make you sleepy (sedative).   An electrical shock will be delivered. AFTER THE PROCEDURE Your heart rhythm will be watched to make sure it does not change.    This information is not intended to replace advice given to you by your health care provider. Make sure you discuss any questions you have with your health care provider.   Document Released: 07/06/2002 Document Revised: 08/06/2014 Document Reviewed:  01/28/2013 Elsevier Interactive Patient Education 2016 [employer]. Electrical Cardioversion, Care After Refer to this sheet in the next few weeks. These instructions provide you with information on caring for yourself after your procedure. Your health care provider may also give you more specific instructions. Your treatment has been planned according to current medical practices, but problems sometimes occur. Call your health care provider if you have any problems or questions after your procedure. WHAT TO EXPECT AFTER THE PROCEDURE After your procedure, it is typical to have the following sensations:  Some redness on the skin where the shocks were delivered. If this is tender, a sunburn lotion or hydrocortisone cream may help.  Possible return of an abnormal heart rhythm within hours or days after the procedure. HOME CARE INSTRUCTIONS  Take medicines only as directed by your health care provider. Be sure you understand how and when to take your medicine.  Learn how to feel your pulse and check it often.  Limit your activity for 48 hours after the procedure or as directed by your health care provider.  Avoid or minimize caffeine and other stimulants as directed by your health care provider. SEEK MEDICAL CARE IF:  You feel like your heart is beating too fast or your pulse is not regular.  You have any questions about your medicines.  You have bleeding that will not stop. SEEK IMMEDIATE MEDICAL CARE IF:  You are dizzy or feel faint.  It is hard to breathe or you feel short of breath.  There is a change in discomfort in your chest.  Your speech is slurred or you have trouble moving an arm or leg on one side of your body.  You get a serious muscle cramp that does not go away.  Your fingers or toes turn cold or blue.   This information is not intended to replace advice given to you by your health care provider. Make sure you discuss any questions you have with your health care  provider.   Document Released: 05/06/2013 Document Revised: 08/06/2014 Document Reviewed: 05/06/2013 Elsevier Interactive Patient Education 2016 [place] Monitored anesthesia care is an anesthesia service for a medical procedure. Anesthesia is the loss of the ability to feel pain. It is produced by medicines called anesthetics. It may affect a small area of your body (local anesthesia), a large area of your body (regional anesthesia), or your entire body (general anesthesia). The need for monitored anesthesia care depends your procedure, your condition, and the potential need for regional or general anesthesia. It is often provided during procedures where:   General anesthesia may be needed if there are complications. This is because you need special care when you are under general anesthesia.   You will be under local or regional anesthesia. This is so that you are able to have higher levels of anesthesia if needed.   You will receive calming  medicines (sedatives). This is especially the case if sedatives are given to put you in a semi-conscious state of relaxation (deep sedation). This is because the amount of sedative needed to produce this state can be hard to predict. Too much of a sedative can produce general anesthesia. Monitored anesthesia care is performed by one or more health care providers who have special training in all types of anesthesia. You will need to meet with these health care providers before your procedure. During this meeting, they will ask you about your medical history. They will also give you instructions to follow. (For example, you will need to stop eating and drinking before your procedure. You may also need to stop or change medicines you are taking.) During your procedure, your health care providers will stay with you. They will:   Watch your condition. This includes watching your blood pressure, breathing, and level of pain.    Diagnose and treat problems that occur.   Give medicines if they are needed. These may include calming medicines (sedatives) and anesthetics.   Make sure you are comfortable.  Having monitored anesthesia care does not necessarily mean that you will be under anesthesia. It does mean that your health care providers will be able to manage anesthesia if you need it or if it occurs. It also means that you will be able to have a different type of anesthesia than you are having if you need it. When your procedure is complete, your health care providers will continue to watch your condition. They will make sure any medicines wear off before you are allowed to go home.    This information is not intended to replace advice given to you by your health care provider. Make sure you discuss any questions you have with your health care provider.   Document Released: 04/11/2005 Document Revised: 08/06/2014 Document Reviewed: 08/27/2012 Elsevier Interactive Patient Education 2016 [employer] PATIENT INSTRUCTIONS POST-ANESTHESIA  IMMEDIATELY FOLLOWING SURGERY:  Do not drive or operate machinery for the first twenty four hours after surgery.  Do not make any important decisions for twenty four hours after surgery or while taking narcotic pain medications or sedatives.  If you develop intractable nausea and vomiting or a severe headache please notify your doctor immediately.  FOLLOW-UP:  Please make an appointment with your surgeon as instructed. You do not need to follow up with anesthesia unless specifically instructed to do so.  WOUND CARE INSTRUCTIONS (if applicable):  Keep a dry clean dressing on the anesthesia/puncture wound site if there is drainage.  Once the wound has quit draining you may leave it open to air.  Generally you should leave the bandage intact for twenty four hours unless there is drainage.  If the epidural site drains for more than 36-48 hours please call the anesthesia  department.  QUESTIONS?:  Please feel free to call your physician or the hospital operator if you have any questions, and they will be happy to assist you.

## 2016-05-17 NOTE — Telephone Encounter (Signed)
PERCERT:   DCCV scheduled for Tuesday, 05/22/2016 at 9:00 am with Dr. Domenic Polite.

## 2016-05-21 ENCOUNTER — Encounter (HOSPITAL_COMMUNITY)
Admission: RE | Admit: 2016-05-21 | Discharge: 2016-05-21 | Disposition: A | Discharge status: Home / Self Care | Payer: Medicare Other | Source: Ambulatory Visit | Attending: Cardiology | Admitting: Cardiology

## 2016-05-21 ENCOUNTER — Other Ambulatory Visit: Payer: Self-pay | Admitting: Cardiology

## 2016-05-21 ENCOUNTER — Encounter (HOSPITAL_COMMUNITY): Payer: Self-pay

## 2016-05-21 DIAGNOSIS — Z7901 Long term (current) use of anticoagulants: Secondary | ICD-10-CM | POA: Diagnosis not present

## 2016-05-21 DIAGNOSIS — Z87891 Personal history of nicotine dependence: Secondary | ICD-10-CM | POA: Diagnosis not present

## 2016-05-21 DIAGNOSIS — K219 Gastro-esophageal reflux disease without esophagitis: Secondary | ICD-10-CM | POA: Diagnosis not present

## 2016-05-21 DIAGNOSIS — Z6836 Body mass index (BMI) 36.0-36.9, adult: Secondary | ICD-10-CM | POA: Diagnosis not present

## 2016-05-21 DIAGNOSIS — C9111 Chronic lymphocytic leukemia of B-cell type in remission: Secondary | ICD-10-CM | POA: Diagnosis not present

## 2016-05-21 DIAGNOSIS — Z79899 Other long term (current) drug therapy: Secondary | ICD-10-CM | POA: Diagnosis not present

## 2016-05-21 DIAGNOSIS — I10 Essential (primary) hypertension: Secondary | ICD-10-CM | POA: Diagnosis not present

## 2016-05-21 DIAGNOSIS — E039 Hypothyroidism, unspecified: Secondary | ICD-10-CM | POA: Diagnosis not present

## 2016-05-21 DIAGNOSIS — I481 Persistent atrial fibrillation: Secondary | ICD-10-CM | POA: Diagnosis not present

## 2016-05-21 HISTORY — DX: Anemia, unspecified: D64.9

## 2016-05-21 LAB — CBC WITH DIFFERENTIAL/PLATELET
BASOS ABS: 0 10*3/uL (ref 0.0–0.1)
BASOS PCT: 0 %
EOS PCT: 1 %
Eosinophils Absolute: 0.2 10*3/uL (ref 0.0–0.7)
HEMATOCRIT: 43.1 % (ref 36.0–46.0)
HEMOGLOBIN: 13.2 g/dL (ref 12.0–15.0)
LYMPHS ABS: 15.5 10*3/uL — AB (ref 0.7–4.0)
LYMPHS PCT: 76 %
MCH: 29.9 pg (ref 26.0–34.0)
MCHC: 30.6 g/dL (ref 30.0–36.0)
MCV: 97.5 fL (ref 78.0–100.0)
MONOS PCT: 3 %
Monocytes Absolute: 0.6 10*3/uL (ref 0.1–1.0)
NEUTROS ABS: 4.1 10*3/uL (ref 1.7–7.7)
Neutrophils Relative %: 20 %
Platelets: 108 10*3/uL — ABNORMAL LOW (ref 150–400)
RBC: 4.42 MIL/uL (ref 3.87–5.11)
RDW: 15.9 % — ABNORMAL HIGH (ref 11.5–15.5)
WBC: 20.4 10*3/uL — ABNORMAL HIGH (ref 4.0–10.5)

## 2016-05-21 LAB — BASIC METABOLIC PANEL
ANION GAP: 3 — AB (ref 5–15)
BUN: 16 mg/dL (ref 6–20)
CALCIUM: 9.3 mg/dL (ref 8.9–10.3)
CO2: 29 mmol/L (ref 22–32)
Chloride: 109 mmol/L (ref 101–111)
Creatinine, Ser: 0.74 mg/dL (ref 0.44–1.00)
GFR calc Af Amer: >60 mL/min (ref >60)
GLUCOSE: 89 mg/dL (ref 65–99)
POTASSIUM: 3.9 mmol/L (ref 3.5–5.1)
Sodium: 141 mmol/L (ref 135–145)

## 2016-05-22 ENCOUNTER — Ambulatory Visit (HOSPITAL_COMMUNITY)
Admission: RE | Admit: 2016-05-22 | Discharge: 2016-05-22 | Disposition: A | Discharge status: Home / Self Care | Payer: Medicare Other | Source: Ambulatory Visit | Attending: Cardiology | Admitting: Cardiology

## 2016-05-22 ENCOUNTER — Encounter (HOSPITAL_COMMUNITY): Payer: Self-pay | Admitting: *Deleted

## 2016-05-22 ENCOUNTER — Ambulatory Visit (HOSPITAL_COMMUNITY): Payer: Medicare Other | Admitting: Anesthesiology

## 2016-05-22 ENCOUNTER — Encounter (HOSPITAL_COMMUNITY)
Admission: RE | Disposition: A | Discharge status: Home / Self Care | Payer: Self-pay | Source: Ambulatory Visit | Attending: Cardiology

## 2016-05-22 DIAGNOSIS — I481 Persistent atrial fibrillation: Secondary | ICD-10-CM | POA: Diagnosis not present

## 2016-05-22 DIAGNOSIS — I10 Essential (primary) hypertension: Secondary | ICD-10-CM | POA: Diagnosis not present

## 2016-05-22 DIAGNOSIS — C9111 Chronic lymphocytic leukemia of B-cell type in remission: Secondary | ICD-10-CM | POA: Insufficient documentation

## 2016-05-22 DIAGNOSIS — E039 Hypothyroidism, unspecified: Secondary | ICD-10-CM | POA: Diagnosis not present

## 2016-05-22 DIAGNOSIS — Z87891 Personal history of nicotine dependence: Secondary | ICD-10-CM | POA: Insufficient documentation

## 2016-05-22 DIAGNOSIS — K219 Gastro-esophageal reflux disease without esophagitis: Secondary | ICD-10-CM | POA: Diagnosis not present

## 2016-05-22 DIAGNOSIS — Z79899 Other long term (current) drug therapy: Secondary | ICD-10-CM | POA: Insufficient documentation

## 2016-05-22 DIAGNOSIS — Z7901 Long term (current) use of anticoagulants: Secondary | ICD-10-CM | POA: Insufficient documentation

## 2016-05-22 DIAGNOSIS — Z6836 Body mass index (BMI) 36.0-36.9, adult: Secondary | ICD-10-CM | POA: Insufficient documentation

## 2016-05-22 HISTORY — PX: CARDIOVERSION: SHX1299

## 2016-05-22 SURGERY — CARDIOVERSION
Anesthesia: Monitor Anesthesia Care

## 2016-05-22 MED ORDER — MIDAZOLAM HCL 5 MG/5ML IJ SOLN
INTRAMUSCULAR | Status: DC | PRN
  Administered 2016-05-22: 2 mg via INTRAVENOUS

## 2016-05-22 MED ORDER — LACTATED RINGERS IV SOLN
INTRAVENOUS | Status: DC | PRN
  Administered 2016-05-22: 08:00:00 via INTRAVENOUS

## 2016-05-22 MED ORDER — MIDAZOLAM HCL 2 MG/2ML IJ SOLN
INTRAMUSCULAR | Status: AC
  Filled 2016-05-22: qty 2

## 2016-05-22 MED ORDER — PROPOFOL 10 MG/ML IV BOLUS
INTRAVENOUS | Status: AC
  Filled 2016-05-22: qty 40

## 2016-05-22 MED ORDER — PROPOFOL 500 MG/50ML IV EMUL
INTRAVENOUS | Status: DC | PRN
  Administered 2016-05-22: 150 ug/kg/min via INTRAVENOUS

## 2016-05-22 MED ORDER — SUCCINYLCHOLINE CHLORIDE 20 MG/ML IJ SOLN
INTRAMUSCULAR | Status: AC
  Filled 2016-05-22: qty 1

## 2016-05-22 NOTE — Anesthesia Preprocedure Evaluation (Signed)
Anesthesia Evaluation  Patient identified by MRN, date of birth, ID band Patient awake    Reviewed: Allergy & Precautions, NPO status , Patient's Chart, lab work & pertinent test results, reviewed documented beta blocker date and time   Airway Mallampati: II  TM Distance: >3 FB     Dental no notable dental hx. (+) Teeth Intact   Pulmonary former smoker,    breath sounds clear to auscultation       Cardiovascular hypertension, Pt. on medications and Pt. on home beta blockers + dysrhythmias Atrial Fibrillation  Rhythm:Regular Rate:Normal     Neuro/Psych negative psych ROS   GI/Hepatic negative GI ROS, Neg liver ROS, hiatal hernia, GERD  Medicated and Controlled,  Endo/Other  Hypothyroidism Morbid obesityObesity  Renal/GU negative Renal ROS     Musculoskeletal  (+) Arthritis ,   Abdominal (+) + obese,   Peds  Hematology  (+) Blood dyscrasia (CLL in remission), , Anticoagulated with Eliquis   Anesthesia Other Findings   Reproductive/Obstetrics                             Anesthesia Physical Anesthesia Plan  ASA: III  Anesthesia Plan: MAC   Post-op Pain Management:    Induction: Intravenous  Airway Management Planned: Simple Face Mask  Additional Equipment:   Intra-op Plan:   Post-operative Plan:   Informed Consent: I have reviewed the patients History and Physical, chart, labs and discussed the procedure including the risks, benefits and alternatives for the proposed anesthesia with the patient or authorized representative who has indicated his/her understanding and acceptance.     Plan Discussed with:   Anesthesia Plan Comments:         Anesthesia Quick Evaluation

## 2016-05-22 NOTE — H&P (View-Only) (Signed)
Electrophysiology Office Note   Date:  05/04/2016   ID:  Daisy Black, Daisy Black October 04, 1950, MRN [id number]  PCP:  Renee Rival, NP  Cardiologist:  Dr Domenic Polite Primary Electrophysiologist: Thompson Grayer, MD    CC: Afib   History of Present Illness: Daisy Black is a 65 y.o. female who presents today for electrophysiology follow-up.   She has started flecainide but remains in afib.  She reports compliance with eliquis.  She received chemotherapy yesterday and feels pretty miserable today.  She report having tachy palpitations last night for which she took additional diltiazem. Today, she denies symptoms of chest pain, shortness of breath, orthopnea, PND, lower extremity edema, claudication, dizziness, presyncope, syncope, bleeding, or neurologic sequela. The patient is tolerating medications without difficulties and is otherwise without complaint today.    Past Medical History:  Diagnosis Date  . Arthritis   . Atrial fibrillation ([place])    Atrial fibrillation - onset in 09/2009; DC cardioversion in 10/2009  . CLL (chronic lymphocytic leukemia) ([place])   . Essential hypertension   . GERD (gastroesophageal reflux disease)   . History of hiatal hernia   . Hyperlipidemia   . Lymphocytosis   . Obesity   . Spleen enlarged   . TSH elevation    Past Surgical History:  Procedure Laterality Date  . ANKLE SURGERY Right   . CARDIOVERSION N/A 03/20/2016   Procedure: CARDIOVERSION;  Surgeon: Satira Sark, MD;  Location: [facility];  Service: Cardiovascular;  Laterality: N/A;  . CHOLECYSTECTOMY    . HERNIA REPAIR  2011   Incisional and umbilical utilizing mesh  . ROTATOR CUFF REPAIR    . TEE WITHOUT CARDIOVERSION N/A 01/17/2016   Procedure: TRANSESOPHAGEAL ECHOCARDIOGRAM (TEE);  Surgeon: Herminio Commons, MD;  Location: [facility];  Service: Cardiovascular;  Laterality: N/A;  . TEE WITHOUT CARDIOVERSION N/A 02/21/2016   Procedure: TRANSESOPHAGEAL ECHOCARDIOGRAM (TEE) WITH PROPOFOL;   Surgeon: Herminio Commons, MD;  Location: [facility];  Service: Cardiovascular;  Laterality: N/A;  . TEE WITHOUT CARDIOVERSION N/A 03/20/2016   Procedure: TRANSESOPHAGEAL ECHOCARDIOGRAM (TEE) WITH PROPOFOL;  Surgeon: Satira Sark, MD;  Location: [facility];  Service: Cardiovascular;  Laterality: N/A;  . TONSILLECTOMY       Current Outpatient Prescriptions  Medication Sig Dispense Refill  . acetaminophen (TYLENOL) 650 MG CR tablet Take 650 mg by mouth every 8 (eight) hours as needed for pain.     Marland Kitchen apixaban (ELIQUIS) 5 MG TABS tablet Take 1 tablet (5 mg total) by mouth 2 (two) times daily. 60 tablet 2  . diltiazem (CARDIZEM) 60 MG tablet Take 1 tablet (60 mg total) by mouth daily as needed.    . Ferrous Fumarate (IRON) 18 MG TBCR Take 1 tablet by mouth daily.     . flecainide (TAMBOCOR) 50 MG tablet Take 2 tablets (100 mg total) by mouth 2 (two) times daily. 180 tablet 3  . levothyroxine (SYNTHROID, LEVOTHROID) 125 MCG tablet Take 125 mcg by mouth daily before breakfast.    . metoprolol tartrate (LOPRESSOR) 50 MG tablet Take 1 tablet (50 mg total) by mouth 2 (two) times daily. (Patient taking differently: Take 50 mg by mouth 3 (three) times daily. ) 180 tablet 3  . pantoprazole (PROTONIX) 40 MG tablet Take 1 tablet (40 mg total) by mouth daily before breakfast. 30 tablet 5  . Probiotic Product (PROBIOTIC PO) Take 1 capsule by mouth daily.    . promethazine (PHENERGAN) 12.5 MG tablet Take 1 tablet (12.5 mg total)  by mouth every 6 (six) hours as needed for nausea or vomiting. 30 tablet 0   No current facility-administered medications for this visit.    Facility-Administered Medications Ordered in Other Visits  Medication Dose Route Frequency Provider Last Rate Last Dose  . hydrocortisone cream 1 % 1 application  1 application Topical TID PRN Satira Sark, MD        Allergies:   Review of patient's allergies indicates no known allergies.   Social History:  The patient  reports that she  quit smoking about 40 years ago. Her smoking use included Cigarettes. She has a 0.50 pack-year smoking history. She has never used smokeless tobacco. She reports that she does not drink alcohol or use drugs.   Family History:  The patient's  family history includes Breast cancer in her paternal aunt; Leukemia in her mother; Ovarian cancer (age of onset: 12) in her mother; Stroke (age of onset: 4) in her father.    ROS:  Please see the history of present illness.   All other systems are reviewed and negative.    PHYSICAL EXAM: VS:  BP 112/70   Pulse 85   Ht 5\' 7"  (1.702 m)   Wt 218 lb (98.9 kg)   SpO2 98%   BMI 34.14 kg/m  , BMI Body mass index is 34.14 kg/m. GEN: Well nourished, well developed, in no acute distress  HEENT: normal  Neck: no JVD, carotid bruits, or masses Cardiac: iRRR; no murmurs, rubs, or gallops,no edema  Respiratory:  clear to auscultation bilaterally, normal work of breathing GI: soft, nontender, nondistended, + BS MS: no deformity or atrophy  Skin: warm and dry  Neuro:  Strength and sensation are intact Psych: euthymic mood, full affect    Recent Labs: 01/12/2016: Magnesium 2.2; TSH 1.986 03/08/2016: ALT 13 03/16/2016: BUN 14; Creatinine, Ser 0.74; Potassium 3.5; Sodium 140 05/03/2016: Hemoglobin 14.0; Platelets 108    Lipid Panel     Component Value Date/Time   CHOL 219 (H) 11/30/2009 1950   TRIG 69 11/30/2009 1950   HDL 56 11/30/2009 1950   CHOLHDL 3.9 Ratio 11/30/2009 1950   VLDL 14 11/30/2009 1950   LDLCALC 149 (H) 11/30/2009 1950     Wt Readings from Last 3 Encounters:  05/04/16 218 lb (98.9 kg)  05/03/16 225 lb 15.5 oz (102.5 kg)  05/01/16 227 lb (103 kg)    ASSESSMENT AND PLAN:  1.  Persistent afib The patient has symptomatic persistent afib.  She has not converted to sinus rhythm with flecainide.  I would therefore advise cardioversion. Risks, benefits, and alternatives to cardioversion were discussed with the patient who wishes to  proceed.  We will try to arrange in [place] for early next week.  She reports compliance with eliquis without interruption. Continue anticoagulation.  2. HTN Stable No change required today  3. Obesity Body mass index is 34.14 kg/m. Weight loss advised  4. CLL/ splenomegally Stable No change required today  Return to see me in 3-4 weeks  Signed, Thompson Grayer, MD

## 2016-05-22 NOTE — Transfer of Care (Signed)
Immediate Anesthesia Transfer of Care Note  Patient: Daisy Black  Procedure(s) Performed: Procedure(s): CARDIOVERSION (N/A)  Patient Location: PACU  Anesthesia Type:MAC  Level of Consciousness: awake, oriented and patient cooperative  Airway & Oxygen Therapy: Patient Spontanous Breathing and Patient connected to nasal cannula oxygen  Post-op Assessment: Report given to RN and Post -op Vital signs reviewed and stable  Post vital signs: Reviewed and stable  Last Vitals:  Vitals:   05/22/16 0807  Pulse: (!) 118  Resp: (!) 24  Temp: 36.5 C    Last Pain:  Vitals:   05/22/16 0807  TempSrc: Oral  PainSc: 0-No pain         Complications: No apparent anesthesia complications

## 2016-05-22 NOTE — Anesthesia Postprocedure Evaluation (Signed)
Anesthesia Post Note  Patient: Daisy Black  Procedure(s) Performed: Procedure(s) (LRB): CARDIOVERSION (N/A)  Patient location during evaluation: PACU Anesthesia Type: MAC Level of consciousness: awake and oriented Pain management: pain level controlled Vital Signs Assessment: post-procedure vital signs reviewed and stable Respiratory status: spontaneous breathing and respiratory function stable Cardiovascular status: stable Postop Assessment: no signs of nausea or vomiting Anesthetic complications: no    Last Vitals:  Vitals:   05/22/16 0807  Pulse: (!) 118  Resp: (!) 24  Temp: 36.5 C    Last Pain:  Vitals:   05/22/16 0807  TempSrc: Oral  PainSc: 0-No pain                 Yalonda Sample A

## 2016-05-22 NOTE — Progress Notes (Signed)
Electrical Cardioversion Procedure Note Daisy Black [id number] [birth year]  Procedure: Electrical Cardioversion Indications:  Persistent atrial fibrillation  Procedure Details Consent: yesTime Out: Verified patient identification, verified procedure, site/side was marked, verified correct patient position, special equipment/implants available, medications/allergies/relevent history reviewed, required imaging and test results available.  {time out performed:10:11  Patient placed on cardiac monitor, pulse oximetry, supplemental oxygen as necessary.  Sedation given: yes Pacer pads placed  yes  Cardioverted 2 time(s).  Cardioverted at 120 joules ans 150 joules Evaluation Findings: Post procedure EKG shows:  Normal sinus rhythm Complications: none Patient did tolerate procedure well.   [name] 05/22/2016, 10:48 AM

## 2016-05-22 NOTE — CV Procedure (Signed)
Direct-current elective cardioversion  Indication: Persistent, symptomatically atrial fibrillation  Description of procedure: Informed consent was obtained as well as pre-sedation screening and review of lab work. Patient confirmed compliance with Eliquis. She was taken to the PACU where sedation was provided by the Anesthesia Service, please refer to their records - Propofol was utilized. Time out performed. Anterior and posterior pads were placed and connected to a biphasic defibrillator. Once adequate sedation was achieved, a synchronized shock at 120 J was delivered which was not successful in restoring sinus rhythm. Patient remained hemodynamically stable and in atrial fibrillation. This was followed by a second synchronized shock at 150 J which was successful in restoring sinus rhythm. Postprocedure ECG reviewed. There were no immediate complications.  Satira Sark, M.D., F.A.C.C.

## 2016-05-22 NOTE — Anesthesia Procedure Notes (Signed)
Procedure Name: MAC Date/Time: 05/22/2016 10:03 AM Performed by: Andree Elk, Jaylnn Ullery A Pre-anesthesia Checklist: Patient identified, Emergency Drugs available, Suction available, Patient being monitored and Timeout performed Oxygen Delivery Method: Simple face mask

## 2016-05-22 NOTE — Discharge Instructions (Signed)
Electrical Cardioversion, Care After Refer to this sheet in the next few weeks. These instructions provide you with information on caring for yourself after your procedure. Your health care provider may also give you more specific instructions. Your treatment has been planned according to current medical practices, but problems sometimes occur. Call your health care provider if you have any problems or questions after your procedure. WHAT TO EXPECT AFTER THE PROCEDURE After your procedure, it is typical to have the following sensations:  Some redness on the skin where the shocks were delivered. If this is tender, a sunburn lotion or hydrocortisone cream may help.  Possible return of an abnormal heart rhythm within hours or days after the procedure. HOME CARE INSTRUCTIONS  Take medicines only as directed by your health care provider. Be sure you understand how and when to take your medicine.  Learn how to feel your pulse and check it often.  Limit your activity for 48 hours after the procedure or as directed by your health care provider.  Avoid or minimize caffeine and other stimulants as directed by your health care provider. SEEK MEDICAL CARE IF:  You feel like your heart is beating too fast or your pulse is not regular.  You have any questions about your medicines.  You have bleeding that will not stop. SEEK IMMEDIATE MEDICAL CARE IF:  You are dizzy or feel faint.  It is hard to breathe or you feel short of breath.  There is a change in discomfort in your chest.  Your speech is slurred or you have trouble moving an arm or leg on one side of your body.  You get a serious muscle cramp that does not go away.  Your fingers or toes turn cold or blue.   This information is not intended to replace advice given to you by your health care provider. Make sure you discuss any questions you have with your health care provider.   Document Released: 05/06/2013 Document Revised: 08/06/2014  Document Reviewed: 05/06/2013 Elsevier Interactive Patient Education 2016 [employer]   PATIENT INSTRUCTIONS POST-ANESTHESIA  IMMEDIATELY FOLLOWING SURGERY:  Do not drive or operate machinery for the first twenty four hours after surgery.  Do not make any important decisions for twenty four hours after surgery or while taking narcotic pain medications or sedatives.  If you develop intractable nausea and vomiting or a severe headache please notify your doctor immediately.  FOLLOW-UP:  Please make an appointment with your surgeon as instructed. You do not need to follow up with anesthesia unless specifically instructed to do so.  WOUND CARE INSTRUCTIONS (if applicable):  Keep a dry clean dressing on the anesthesia/puncture wound site if there is drainage.  Once the wound has quit draining you may leave it open to air.  Generally you should leave the bandage intact for twenty four hours unless there is drainage.  If the epidural site drains for more than 36-48 hours please call the anesthesia department.  QUESTIONS?:  Please feel free to call your physician or the hospital operator if you have any questions, and they will be happy to assist you.

## 2016-05-22 NOTE — Interval H&P Note (Signed)
History and Physical Interval Note:  05/22/2016 7:54 AM  [name] presents today for elective cardioversion as discussed above. She reports compliance with Eliquis and also remains on flecainide. Risks and benefits discussed. She is ready to proceed.  Rozann Lesches

## 2016-05-28 ENCOUNTER — Encounter (HOSPITAL_COMMUNITY): Payer: Self-pay | Admitting: Cardiology

## 2016-05-30 NOTE — Progress Notes (Signed)
Cardiology Office Note  Date: 06/01/2016   ID: Daisy, Black [birth year], MRN [id number]  PCP: Renee Rival, NP  Primary Cardiologist: Rozann Lesches, MD   Chief Complaint  Patient presents with  . PAF    History of Present Illness: Daisy Black is a 65 y.o. female last seen in September with subsequent follow-up by Dr. Rayann Heman and in the atrial fibrillation clinic. She has been placed on flecainide and recently underwent elective cardioversion with successful restoration of sinus rhythm. She is here today with her daughter for a follow-up visit. States that she feels much better in sinus rhythm. ECG today confirms sinus rhythm with a few PACs. QRS duration is normal at 82 ms.  I reviewed her medications which are outlined below. We have taken her off Cardizem which she does have trouble tolerating, however uses for heart rate control when she has been out of rhythm. We plan to continue current doses of Lopressor, flecainide, and Eliquis.  Past Medical History:  Diagnosis Date  . Anemia   . Arthritis   . CLL (chronic lymphocytic leukemia) ([place])   . Essential hypertension   . GERD (gastroesophageal reflux disease)   . History of hiatal hernia   . Hyperlipidemia   . Paroxysmal atrial fibrillation (HCC)   . Splenomegaly     Current Outpatient Prescriptions  Medication Sig Dispense Refill  . acetaminophen (TYLENOL) 650 MG CR tablet Take 650 mg by mouth every 8 (eight) hours as needed for pain.     Marland Kitchen apixaban (ELIQUIS) 5 MG TABS tablet Take 1 tablet (5 mg total) by mouth 2 (two) times daily. 42 tablet 0  . cetirizine (ZYRTEC) 10 MG tablet Take 10 mg by mouth daily as needed for allergies.    . Ferrous Fumarate (IRON) 18 MG TBCR Take 1 tablet by mouth daily.     . flecainide (TAMBOCOR) 50 MG tablet Take 2 tablets (100 mg total) by mouth 2 (two) times daily. 180 tablet 3  . fluticasone (FLONASE) 50 MCG/ACT nasal spray Place 1 spray into both nostrils daily as  needed for allergies or rhinitis.    Marland Kitchen levothyroxine (SYNTHROID, LEVOTHROID) 125 MCG tablet Take 125 mcg by mouth daily before breakfast.    . metoprolol tartrate (LOPRESSOR) 50 MG tablet Take 1 tablet (50 mg total) by mouth 2 (two) times daily. 180 tablet 3  . pantoprazole (PROTONIX) 40 MG tablet Take 1 tablet (40 mg total) by mouth daily before breakfast. 30 tablet 5  . Probiotic Product (PROBIOTIC PO) Take 1 capsule by mouth daily.    . promethazine (PHENERGAN) 12.5 MG tablet Take 1 tablet (12.5 mg total) by mouth every 6 (six) hours as needed for nausea or vomiting. 30 tablet 0   No current facility-administered medications for this visit.    Facility-Administered Medications Ordered in Other Visits  Medication Dose Route Frequency Provider Last Rate Last Dose  . hydrocortisone cream 1 % 1 application  1 application Topical TID PRN Satira Sark, MD       Allergies:  Review of patient's allergies indicates no known allergies.   Social History: The patient  reports that she quit smoking about 40 years ago. Her smoking use included Cigarettes. She has a 0.50 pack-year smoking history. She has never used smokeless tobacco. She reports that she does not drink alcohol or use drugs.   ROS:  Please see the history of present illness. Otherwise, complete review of systems is positive for arthritic knee pain.  All other systems are reviewed and negative.   Physical Exam: VS:  BP 118/64   Pulse 61   Ht 5\' 7"  (1.702 m)   Wt 237 lb (107.5 kg)   SpO2 99%   BMI 37.12 kg/m , BMI Body mass index is 37.12 kg/m.  Wt Readings from Last 3 Encounters:  06/01/16 237 lb (107.5 kg)  05/22/16 235 lb (106.6 kg)  05/21/16 235 lb (106.6 kg)    General: Patient appears comfortable at rest. HEENT: Conjunctiva and lids normal, oropharynx clear with moist mucosa. Neck: Supple, no elevated JVP or carotid bruits, no thyromegaly. Lungs: Clear to auscultation, nonlabored breathing at rest. Cardiac: Regular  rate and rhythm, no S3 or significant systolic murmur, no pericardial rub. Abdomen: Soft, nontender, no hepatomegaly, bowel sounds present, no guarding or rebound. Extremities: No pitting edema, distal pulses 2+.  ECG: I personally reviewed the tracing from 05/22/2016 which showed normal sinus rhythm.  Recent Labwork: 01/12/2016: Magnesium 2.2; TSH 1.986 03/08/2016: ALT 13; AST 21 05/21/2016: BUN 16; Creatinine, Ser 0.74; Hemoglobin 13.2; Platelets 108; Potassium 3.9; Sodium 141     Component Value Date/Time   CHOL 219 (H) 11/30/2009 1950   TRIG 69 11/30/2009 1950   HDL 56 11/30/2009 1950   CHOLHDL 3.9 Ratio 11/30/2009 1950   VLDL 14 11/30/2009 1950   LDLCALC 149 (H) 11/30/2009 1950    Other Studies Reviewed Today:  TEE 03/20/2016: Study Conclusions  - Left ventricle: Systolic function was normal. The estimated   ejection fraction was in the range of 60% to 65%. Wall motion was   normal; there were no regional wall motion abnormalities. No   evidence of thrombus. - Descending aorta: The descending aorta had minor luminal   irregularities. - Mitral valve: There was mild regurgitation. - Left atrium: The atrium was mildly dilated. No evidence of   thrombus in the atrial cavity or appendage. Prominent   trabeculation visualized at tip of appendage. Emptying velocity   was moderately reduced. - Right atrium: No evidence of thrombus in the atrial cavity or   appendage. - Tricuspid valve: There was mild regurgitation. - Pericardium, extracardiac: There was no pericardial effusion.  Impressions:  - LVEF 60-65% without wall motion abnormalities. No left atrial   appendage thrombus visualized. There is prominent trabeculation   noted at the appendage tip. Emptying velocity was moderately   reduced. No right atrial appendage thrombus noted. Mild tricuspid   regurgitation.  Assessment and Plan:  1. Paroxysmal atrial fibrillation with CHADSVASC score of 3. She underwent  successful cardioversion recently on flecainide and is maintaining sinus rhythm at this time. Plan is to continue current medical regimen, keep scheduled follow-up in December.  2. Essential hypertension, blood pressure is well controlled today.  Current medicines were reviewed with the patient today.   Orders Placed This Encounter  Procedures  . EKG 12-Lead    Disposition: Follow-up in December as scheduled.  Signed, Satira Sark, MD, [facility] 06/01/2016 11:33 AM    [facility] at [place], [place], [place] [address] Phone: [phone]; Fax: [phone]

## 2016-06-01 ENCOUNTER — Ambulatory Visit (INDEPENDENT_AMBULATORY_CARE_PROVIDER_SITE_OTHER): Payer: Medicare Other | Admitting: Cardiology

## 2016-06-01 ENCOUNTER — Ambulatory Visit: Payer: Medicare Other | Admitting: Cardiology

## 2016-06-01 ENCOUNTER — Encounter: Payer: Self-pay | Admitting: Cardiology

## 2016-06-01 VITALS — BP 118/64 | HR 61 | Ht 67.0 in | Wt 237.0 lb

## 2016-06-01 DIAGNOSIS — I480 Paroxysmal atrial fibrillation: Secondary | ICD-10-CM

## 2016-06-01 DIAGNOSIS — I10 Essential (primary) hypertension: Secondary | ICD-10-CM

## 2016-06-01 MED ORDER — APIXABAN 5 MG PO TABS: 5.0000 mg | ORAL_TABLET | Freq: Two times a day (BID) | ORAL | 0 refills | Status: DC

## 2016-06-01 NOTE — Patient Instructions (Signed)
Your physician recommends that you schedule a follow-up appointment AS SCHEDULED IN December  Your physician recommends that you continue on your current medications as directed. Please refer to the Current Medication list given to you today.  WE HAVE GIVEN YOU SAMPLES OF ELIQUIS   Thank you for choosing [facility]!!

## 2016-07-10 ENCOUNTER — Telehealth: Payer: Self-pay | Admitting: Cardiology

## 2016-07-10 NOTE — Telephone Encounter (Signed)
Left leg she note red area inside ankle,denies ulcer,heat from area. Says is throbs occasionally,denies injury.Has apt tomorrow to see Dr Domenic Polite, I asked her to wear something that we could look at her leg easily.She has not missed any doses of her anticoagulant Eliquis

## 2016-07-10 NOTE — Telephone Encounter (Addendum)
Has had swollen legs for a week and half and now

## 2016-07-10 NOTE — Progress Notes (Signed)
Cardiology Office Note  Date: 07/11/2016   ID: Marvyl, Ebel 04-12-1951, MRN [id number]  PCP: Renee Rival, NP  Primary Cardiologist: Rozann Lesches, MD   Chief Complaint  Patient presents with  . PAF    History of Present Illness: Daisy Black is a 65 y.o. female last seen in November. She is here with her daughter for a follow-up visit. Still feels much better maintaining sinus rhythm. She has not felt any significant palpitations or chest pain. She does report having leg swelling, left worse than right. Also a small petechial rash on her right forearm which is resolving. No major bleeding episodes, no changes in stools.  We discussed her medications. She reports compliance. She does report that heart rate is in the low 50s sometimes at rest. We discussed considering a decrease in her Lopressor dose.  Past Medical History:  Diagnosis Date  . Anemia   . Arthritis   . CLL (chronic lymphocytic leukemia) ([place])   . Essential hypertension   . GERD (gastroesophageal reflux disease)   . History of hiatal hernia   . Hyperlipidemia   . Paroxysmal atrial fibrillation (HCC)   . Splenomegaly     Past Surgical History:  Procedure Laterality Date  . ANKLE SURGERY Right    repair fracture  . CARDIOVERSION N/A 03/20/2016   Procedure: CARDIOVERSION;  Surgeon: Satira Sark, MD;  Location: [facility];  Service: Cardiovascular;  Laterality: N/A;  . CARDIOVERSION N/A 05/22/2016   Procedure: CARDIOVERSION;  Surgeon: Satira Sark, MD;  Location: [facility];  Service: Cardiovascular;  Laterality: N/A;  . CHOLECYSTECTOMY    . HERNIA REPAIR  2011   Incisional and umbilical utilizing mesh  . ROTATOR CUFF REPAIR Left   . TEE WITHOUT CARDIOVERSION N/A 01/17/2016   Procedure: TRANSESOPHAGEAL ECHOCARDIOGRAM (TEE);  Surgeon: Herminio Commons, MD;  Location: [facility];  Service: Cardiovascular;  Laterality: N/A;  . TEE WITHOUT CARDIOVERSION N/A 02/21/2016   Procedure:  TRANSESOPHAGEAL ECHOCARDIOGRAM (TEE) WITH PROPOFOL;  Surgeon: Herminio Commons, MD;  Location: [facility];  Service: Cardiovascular;  Laterality: N/A;  . TEE WITHOUT CARDIOVERSION N/A 03/20/2016   Procedure: TRANSESOPHAGEAL ECHOCARDIOGRAM (TEE) WITH PROPOFOL;  Surgeon: Satira Sark, MD;  Location: [facility];  Service: Cardiovascular;  Laterality: N/A;  . TONSILLECTOMY      Current Outpatient Prescriptions  Medication Sig Dispense Refill  . acetaminophen (TYLENOL) 650 MG CR tablet Take 650 mg by mouth every 8 (eight) hours as needed for pain.     Marland Kitchen apixaban (ELIQUIS) 5 MG TABS tablet Take 1 tablet (5 mg total) by mouth 2 (two) times daily. 42 tablet 0  . cetirizine (ZYRTEC) 10 MG tablet Take 10 mg by mouth daily as needed for allergies.    . Ferrous Fumarate (IRON) 18 MG TBCR Take 1 tablet by mouth daily.     . flecainide (TAMBOCOR) 50 MG tablet Take 2 tablets (100 mg total) by mouth 2 (two) times daily. 180 tablet 3  . fluticasone (FLONASE) 50 MCG/ACT nasal spray Place 1 spray into both nostrils daily as needed for allergies or rhinitis.    Marland Kitchen levothyroxine (SYNTHROID, LEVOTHROID) 125 MCG tablet Take 125 mcg by mouth daily before breakfast.    . pantoprazole (PROTONIX) 40 MG tablet Take 1 tablet (40 mg total) by mouth daily before breakfast. 30 tablet 5  . Probiotic Product (PROBIOTIC PO) Take 1 capsule by mouth daily.    . promethazine (PHENERGAN) 12.5 MG tablet Take 1 tablet (12.5  mg total) by mouth every 6 (six) hours as needed for nausea or vomiting. 30 tablet 0   No current facility-administered medications for this visit.    Facility-Administered Medications Ordered in Other Visits  Medication Dose Route Frequency Provider Last Rate Last Dose  . hydrocortisone cream 1 % 1 application  1 application Topical TID PRN Satira Sark, MD       Allergies:  Patient has no known allergies.   Social History: The patient  reports that she quit smoking about 40 years ago. Her smoking use  included Cigarettes. She has a 0.50 pack-year smoking history. She has never used smokeless tobacco. She reports that she does not drink alcohol or use drugs.   ROS:  Please see the history of present illness. Otherwise, complete review of systems is positive for none.  All other systems are reviewed and negative.   Physical Exam: VS:  BP 118/68   Pulse 73   Ht 5\' 10"  (1.778 m)   Wt 242 lb (109.8 kg)   SpO2 98%   BMI 34.72 kg/m , BMI Body mass index is 34.72 kg/m.  Wt Readings from Last 3 Encounters:  07/11/16 242 lb (109.8 kg)  06/01/16 237 lb (107.5 kg)  05/22/16 235 lb (106.6 kg)    General: Patient appears comfortable at rest. HEENT: Conjunctiva and lids normal, oropharynx clear with moist mucosa. Neck: Supple, no elevated JVP or carotid bruits, no thyromegaly. Lungs: Clear to auscultation, nonlabored breathing at rest. Cardiac: Regular rate and rhythm, no S3 or significant systolic murmur, no pericardial rub. Abdomen: Soft, nontender, no hepatomegaly, bowel sounds present, no guarding or rebound. Extremities: 1-2+ leg edema, distal pulses 2+. Skin: Warm and dry. Resolving petechial rash on the right forearm. Musculoskeletal: No kyphosis. Neuropsychiatric: Alert and oriented 3, affect appropriate.   ECG: I personally reviewed the tracing from 06/01/2016 which showed sinus bradycardia with PACs.  Recent Labwork: 01/12/2016: Magnesium 2.2; TSH 1.986 03/08/2016: ALT 13; AST 21 05/21/2016: BUN 16; Creatinine, Ser 0.74; Hemoglobin 13.2; Platelets 108; Potassium 3.9; Sodium 141     Component Value Date/Time   CHOL 219 (H) 11/30/2009 1950   TRIG 69 11/30/2009 1950   HDL 56 11/30/2009 1950   CHOLHDL 3.9 Ratio 11/30/2009 1950   VLDL 14 11/30/2009 1950   LDLCALC 149 (H) 11/30/2009 1950    Other Studies Reviewed Today:  TEE 03/20/2016: Study Conclusions  - Left ventricle: Systolic function was normal. The estimated ejection fraction was in the range of 60% to 65%. Wall  motion was normal; there were no regional wall motion abnormalities. No evidence of thrombus. - Descending aorta: The descending aorta had minor luminal irregularities. - Mitral valve: There was mild regurgitation. - Left atrium: The atrium was mildly dilated. No evidence of thrombus in the atrial cavity or appendage. Prominent trabeculation visualized at tip of appendage. Emptying velocity was moderately reduced. - Right atrium: No evidence of thrombus in the atrial cavity or appendage. - Tricuspid valve: There was mild regurgitation. - Pericardium, extracardiac: There was no pericardial effusion.  Impressions:  - LVEF 60-65% without wall motion abnormalities. No left atrial appendage thrombus visualized. There is prominent trabeculation noted at the appendage tip. Emptying velocity was moderately reduced. No right atrial appendage thrombus noted. Mild tricuspid regurgitation.  Assessment and Plan:  1. Paroxysmal atrial fibrillation, currently doing well and maintaining sinus rhythm on flecainide, Lopressor, and Eliquis.  2. Resting bradycardia, reportedly heart rate to the low 50s. We will reduce Lopressor to 25 g twice  daily for now.  3. Bilateral leg edema. Provided Lasix 20 mg to be taken at least for a few days to one week to improve leg edema and use only as needed. LVEF normal range by last assessment.  4. Essential hypertension, blood pressure is normal today.  Current medicines were reviewed with the patient today.  Disposition: Follow-up in 3 months.  Signed, Satira Sark, MD, [facility] 07/11/2016 4:17 PM    [facility] at [facility] [address] [place], [place] [address] Phone: [phone]; Fax: [phone]

## 2016-07-10 NOTE — Telephone Encounter (Signed)
Pt called stating she's have leg swelling, pain and it's red, pt's worried it may be a blood clot. Please give her a call @ [phone]

## 2016-07-11 ENCOUNTER — Encounter: Payer: Self-pay | Admitting: Cardiology

## 2016-07-11 ENCOUNTER — Ambulatory Visit (INDEPENDENT_AMBULATORY_CARE_PROVIDER_SITE_OTHER): Payer: Medicare Other | Admitting: Cardiology

## 2016-07-11 VITALS — BP 118/68 | HR 73 | Ht 70.0 in | Wt 242.0 lb

## 2016-07-11 DIAGNOSIS — I480 Paroxysmal atrial fibrillation: Secondary | ICD-10-CM | POA: Diagnosis not present

## 2016-07-11 DIAGNOSIS — I10 Essential (primary) hypertension: Secondary | ICD-10-CM

## 2016-07-11 DIAGNOSIS — R600 Localized edema: Secondary | ICD-10-CM | POA: Diagnosis not present

## 2016-07-11 DIAGNOSIS — R001 Bradycardia, unspecified: Secondary | ICD-10-CM

## 2016-07-11 MED ORDER — METOPROLOL TARTRATE 25 MG PO TABS: 25.0000 mg | ORAL_TABLET | Freq: Two times a day (BID) | ORAL | 3 refills | Status: DC

## 2016-07-11 MED ORDER — FUROSEMIDE 20 MG PO TABS
20.0000 mg | ORAL_TABLET | Freq: Every day | ORAL | 3 refills | Status: DC
Start: 2016-07-11 — End: 2017-01-31

## 2016-07-11 NOTE — Patient Instructions (Signed)
Medication Instructions:  DECREASE METOPROLOL TO 25 MG - TWO TIMES DAILY START LASIX 20 MG DAILY- * TAKE 1 DAILY FOR NEXT 3 DAYS, THEN TAKE DAILY AS NEEDED FOR SWELLING Labwork: NONE  Testing/Procedures: NONE  Follow-Up: Your physician recommends that you schedule a follow-up appointment in: 3 MONTHS    Any Other Special Instructions Will Be Listed Below (If Applicable).     If you need a refill on your cardiac medications before your next appointment, please call your pharmacy.

## 2016-07-17 ENCOUNTER — Inpatient Hospital Stay: Payer: Medicare Other | Attending: Oncology

## 2016-07-17 ENCOUNTER — Ambulatory Visit: Payer: Medicare Other | Admitting: Oncology

## 2016-07-17 ENCOUNTER — Encounter
Admission: RE | Admit: 2016-07-17 | Discharge: 2016-07-17 | Disposition: A | Discharge status: Home / Self Care | Payer: Medicare Other | Source: Ambulatory Visit | Attending: Oncology | Admitting: Oncology

## 2016-07-17 DIAGNOSIS — I480 Paroxysmal atrial fibrillation: Secondary | ICD-10-CM | POA: Insufficient documentation

## 2016-07-17 DIAGNOSIS — Z7901 Long term (current) use of anticoagulants: Secondary | ICD-10-CM | POA: Diagnosis not present

## 2016-07-17 DIAGNOSIS — Z87891 Personal history of nicotine dependence: Secondary | ICD-10-CM | POA: Diagnosis not present

## 2016-07-17 DIAGNOSIS — I10 Essential (primary) hypertension: Secondary | ICD-10-CM | POA: Diagnosis not present

## 2016-07-17 DIAGNOSIS — E785 Hyperlipidemia, unspecified: Secondary | ICD-10-CM | POA: Diagnosis not present

## 2016-07-17 DIAGNOSIS — R161 Splenomegaly, not elsewhere classified: Secondary | ICD-10-CM | POA: Insufficient documentation

## 2016-07-17 DIAGNOSIS — R5383 Other fatigue: Secondary | ICD-10-CM | POA: Insufficient documentation

## 2016-07-17 DIAGNOSIS — C9110 Chronic lymphocytic leukemia of B-cell type not having achieved remission: Secondary | ICD-10-CM | POA: Insufficient documentation

## 2016-07-17 DIAGNOSIS — Z806 Family history of leukemia: Secondary | ICD-10-CM | POA: Diagnosis not present

## 2016-07-17 DIAGNOSIS — R531 Weakness: Secondary | ICD-10-CM | POA: Insufficient documentation

## 2016-07-17 DIAGNOSIS — Z8041 Family history of malignant neoplasm of ovary: Secondary | ICD-10-CM | POA: Diagnosis not present

## 2016-07-17 DIAGNOSIS — K219 Gastro-esophageal reflux disease without esophagitis: Secondary | ICD-10-CM | POA: Insufficient documentation

## 2016-07-17 DIAGNOSIS — E041 Nontoxic single thyroid nodule: Secondary | ICD-10-CM | POA: Insufficient documentation

## 2016-07-17 DIAGNOSIS — Z79899 Other long term (current) drug therapy: Secondary | ICD-10-CM | POA: Insufficient documentation

## 2016-07-17 DIAGNOSIS — Z803 Family history of malignant neoplasm of breast: Secondary | ICD-10-CM | POA: Insufficient documentation

## 2016-07-17 DIAGNOSIS — D509 Iron deficiency anemia, unspecified: Secondary | ICD-10-CM | POA: Insufficient documentation

## 2016-07-17 LAB — COMPREHENSIVE METABOLIC PANEL
ALBUMIN: 3.9 g/dL (ref 3.5–5.0)
ALT: 13 U/L — ABNORMAL LOW (ref 14–54)
ANION GAP: 6 (ref 5–15)
AST: 17 U/L (ref 15–41)
Alkaline Phosphatase: 80 U/L (ref 38–126)
BUN: 16 mg/dL (ref 6–20)
CO2: 27 mmol/L (ref 22–32)
Calcium: 9.9 mg/dL (ref 8.9–10.3)
Chloride: 107 mmol/L (ref 101–111)
Creatinine, Ser: 0.7 mg/dL (ref 0.44–1.00)
GFR calc Af Amer: >60 mL/min (ref >60)
GFR calc non Af Amer: >60 mL/min (ref >60)
GLUCOSE: 96 mg/dL (ref 65–99)
POTASSIUM: 3.5 mmol/L (ref 3.5–5.1)
SODIUM: 140 mmol/L (ref 135–145)
Total Bilirubin: 0.9 mg/dL (ref 0.3–1.2)
Total Protein: 6.4 g/dL — ABNORMAL LOW (ref 6.5–8.1)

## 2016-07-17 LAB — GLUCOSE, CAPILLARY: Glucose-Capillary: 87 mg/dL (ref 65–99)

## 2016-07-17 LAB — IRON AND TIBC
Iron: 95 ug/dL (ref 28–170)
SATURATION RATIOS: 30 % (ref 10.4–31.8)
TIBC: 314 ug/dL (ref 250–450)
UIBC: 219 ug/dL

## 2016-07-17 LAB — CBC WITH DIFFERENTIAL/PLATELET
BASOS PCT: 0 %
Basophils Absolute: 0 10*3/uL (ref 0–0.1)
EOS ABS: 0.2 10*3/uL (ref 0–0.7)
Eosinophils Relative: 4 %
HCT: 41.4 % (ref 35.0–47.0)
Hemoglobin: 13.9 g/dL (ref 12.0–16.0)
Lymphocytes Relative: 60 %
Lymphs Abs: 3.9 10*3/uL — ABNORMAL HIGH (ref 1.0–3.6)
MCH: 30.3 pg (ref 26.0–34.0)
MCHC: 33.5 g/dL (ref 32.0–36.0)
MCV: 90.3 fL (ref 80.0–100.0)
MONO ABS: 0.3 10*3/uL (ref 0.2–0.9)
MONOS PCT: 4 %
NEUTROS PCT: 32 %
Neutro Abs: 2 10*3/uL (ref 1.4–6.5)
PLATELETS: 109 10*3/uL — AB (ref 150–440)
RBC: 4.59 MIL/uL (ref 3.80–5.20)
RDW: 15.2 % — AB (ref 11.5–14.5)
WBC: 6.5 10*3/uL (ref 3.6–11.0)

## 2016-07-17 LAB — FERRITIN: FERRITIN: 27 ng/mL (ref 11–307)

## 2016-07-17 MED ORDER — FLUDEOXYGLUCOSE F - 18 (FDG) INJECTION
12.8000 | Freq: Once | INTRAVENOUS | Status: AC | PRN
  Administered 2016-07-17: 12.8 via INTRAVENOUS

## 2016-07-18 NOTE — Progress Notes (Signed)
[facility]  [phone] [phone]  ID: Daisy Black OB: 10-27-1950  MR#: [id number]  [id number]  Patient Care Team: Renee Rival, NP as PCP - General (Nurse Practitioner) Aviva Signs, MD (General Surgery) Rico Junker, RN as Registered Nurse Theodore Demark, RN as Registered Nurse Satira Sark, MD as Consulting Physician (Cardiology) Daneil Dolin, MD as Consulting Physician (Gastroenterology)  CHIEF COMPLAINT: CLL  INTERVAL HISTORY: Patient returns to clinic today for further evaluation and discussion of her imaging results. She currently feels well and is asymptomatic. She does not complain of shortness of breath or chest pain this week. Patient states her weakness and fatigue continue to improve. She no longer complains of early satiety, reflux, or nausea. She continues to be anxious. She has no fevers, chills, or night sweats. She denies any constipation or diarrhea. She has no melanoma or hematochezia. Patient offers no specific complaints today.  REVIEW OF SYSTEMS:   Review of Systems  Constitutional: Negative for diaphoresis, fever, malaise/fatigue and weight loss.  Respiratory: Negative.  Negative for cough and shortness of breath.   Cardiovascular: Negative.  Negative for chest pain and leg swelling.  Gastrointestinal: Negative.  Negative for abdominal pain, blood in stool, constipation, diarrhea, heartburn, melena, nausea and vomiting.  Genitourinary: Negative.   Musculoskeletal: Negative.   Neurological: Negative.  Negative for weakness.  Psychiatric/Behavioral: The patient is nervous/anxious.     As per HPI. Otherwise, a complete review of systems is negative.  PAST MEDICAL HISTORY: Past Medical History:  Diagnosis Date  . Anemia   . Arthritis   . CLL (chronic lymphocytic leukemia) ([place])   . Essential hypertension   . GERD (gastroesophageal reflux disease)   . History of hiatal hernia   .  Hyperlipidemia   . Paroxysmal atrial fibrillation ([facility])   . Splenomegaly     PAST SURGICAL HISTORY: Past Surgical History:  Procedure Laterality Date  . ANKLE SURGERY Right    repair fracture  . CARDIOVERSION N/A 03/20/2016   Procedure: CARDIOVERSION;  Surgeon: Satira Sark, MD;  Location: [facility];  Service: Cardiovascular;  Laterality: N/A;  . CARDIOVERSION N/A 05/22/2016   Procedure: CARDIOVERSION;  Surgeon: Satira Sark, MD;  Location: [facility];  Service: Cardiovascular;  Laterality: N/A;  . CHOLECYSTECTOMY    . HERNIA REPAIR  2011   Incisional and umbilical utilizing mesh  . ROTATOR CUFF REPAIR Left   . TEE WITHOUT CARDIOVERSION N/A 01/17/2016   Procedure: TRANSESOPHAGEAL ECHOCARDIOGRAM (TEE);  Surgeon: Herminio Commons, MD;  Location: [facility];  Service: Cardiovascular;  Laterality: N/A;  . TEE WITHOUT CARDIOVERSION N/A 02/21/2016   Procedure: TRANSESOPHAGEAL ECHOCARDIOGRAM (TEE) WITH PROPOFOL;  Surgeon: Herminio Commons, MD;  Location: [facility];  Service: Cardiovascular;  Laterality: N/A;  . TEE WITHOUT CARDIOVERSION N/A 03/20/2016   Procedure: TRANSESOPHAGEAL ECHOCARDIOGRAM (TEE) WITH PROPOFOL;  Surgeon: Satira Sark, MD;  Location: [facility];  Service: Cardiovascular;  Laterality: N/A;  . TONSILLECTOMY      FAMILY HISTORY Family History  Problem Relation Age of Onset  . Ovarian cancer Mother 31    Secondary to ovarian cancer  . Leukemia Mother   . Stroke Father 36    Brain stem infarction  . Breast cancer Paternal Aunt        ADVANCED DIRECTIVES:    HEALTH MAINTENANCE: Social History  Substance Use Topics  . Smoking status: Former Smoker    Packs/day: 0.25    Years: 2.00    Types:  Cigarettes    Quit date: 08/03/1975  . Smokeless tobacco: Never Used  . Alcohol use No     Colonoscopy:  PAP:  Bone density:  Lipid panel:  No Known Allergies  Current Outpatient Prescriptions  Medication Sig Dispense Refill  . acetaminophen (TYLENOL) 650 MG CR  tablet Take 650 mg by mouth every 8 (eight) hours as needed for pain.     Marland Kitchen apixaban (ELIQUIS) 5 MG TABS tablet Take 1 tablet (5 mg total) by mouth 2 (two) times daily. 42 tablet 0  . cetirizine (ZYRTEC) 10 MG tablet Take 10 mg by mouth daily as needed for allergies.    . Ferrous Fumarate (IRON) 18 MG TBCR Take 1 tablet by mouth daily.     . flecainide (TAMBOCOR) 50 MG tablet Take 2 tablets (100 mg total) by mouth 2 (two) times daily. 180 tablet 3  . fluticasone (FLONASE) 50 MCG/ACT nasal spray Place 1 spray into both nostrils daily as needed for allergies or rhinitis.    . furosemide (LASIX) 20 MG tablet Take 1 tablet (20 mg total) by mouth daily. As needed for swelling 30 tablet 3  . levothyroxine (SYNTHROID, LEVOTHROID) 125 MCG tablet Take 125 mcg by mouth daily before breakfast.    . metoprolol tartrate (LOPRESSOR) 25 MG tablet Take 1 tablet (25 mg total) by mouth 2 (two) times daily. (Patient taking differently: Take 12.5 mg by mouth 2 (two) times daily. ) 180 tablet 3  . pantoprazole (PROTONIX) 40 MG tablet Take 1 tablet (40 mg total) by mouth daily before breakfast. 30 tablet 5  . Probiotic Product (PROBIOTIC PO) Take 1 capsule by mouth daily.    . promethazine (PHENERGAN) 12.5 MG tablet Take 1 tablet (12.5 mg total) by mouth every 6 (six) hours as needed for nausea or vomiting. 30 tablet 0   No current facility-administered medications for this visit.    Facility-Administered Medications Ordered in Other Visits  Medication Dose Route Frequency Provider Last Rate Last Dose  . hydrocortisone cream 1 % 1 application  1 application Topical TID PRN Satira Sark, MD        OBJECTIVE: Vitals:   07/19/16 1024  BP: 125/63  Pulse: 69  Resp: 18  Temp: 97.1 F (36.2 C)     Body mass index is 34.99 kg/m.    ECOG FS:0 - Asymptomatic  General: Well-developed, well-nourished, no acute distress. Eyes: Pink conjunctiva, anicteric sclera. HEENT: Cervical lymph nodes are no longer  palpable. Lungs: Clear to auscultation bilaterally. Heart: Regular rate and rhythm. No rubs, murmurs, or gallops. Abdomen: Soft, nontender, nondistended. Splenomegaly noted, normoactive bowel sounds. Musculoskeletal: No edema, cyanosis, or clubbing. Neuro: Alert, answering all questions appropriately. Cranial nerves grossly intact. Skin: No rashes or petechiae noted. Psych: Normal affect.  LAB RESULTS:  Lab Results  Component Value Date   NA 140 07/17/2016   K 3.5 07/17/2016   CL 107 07/17/2016   CO2 27 07/17/2016   GLUCOSE 96 07/17/2016   BUN 16 07/17/2016   CREATININE 0.70 07/17/2016   CALCIUM 9.9 07/17/2016   PROT 6.4 (L) 07/17/2016   ALBUMIN 3.9 07/17/2016   AST 17 07/17/2016   ALT 13 (L) 07/17/2016   ALKPHOS 80 07/17/2016   BILITOT 0.9 07/17/2016   GFRNONAA >60 07/17/2016   GFRAA >60 07/17/2016    Lab Results  Component Value Date   WBC 6.5 07/17/2016   NEUTROABS 2.0 07/17/2016   HGB 13.9 07/17/2016   HCT 41.4 07/17/2016   MCV 90.3 07/17/2016  PLT 109 (L) 07/17/2016     STUDIES: Nm Pet Image Restag (ps) Skull Base To Thigh  Result Date: 07/17/2016 CLINICAL DATA:  Subsequent Treatment strategy for chronic lymphocytic leukemia. EXAM: NUCLEAR MEDICINE PET SKULL BASE TO THIGH TECHNIQUE: 12.8 mCi F-18 FDG was injected intravenously. Full-ring PET imaging was performed from the skull base to thigh after the radiotracer. CT data was obtained and used for attenuation correction and anatomic localization. FASTING BLOOD GLUCOSE:  Value: 87 mg/dl COMPARISON:  04/05/2016 FINDINGS: NECK Hypermetabolic nodule within the isthmus of the thyroid gland is again identified. This measures 1.6 cm and has an SUV max equal to 13.6. Left lobe of thyroid gland nodule with increased uptake is again noted measuring 1.6 cm and has an SUV max equal to 5.6. No hypermetabolic lymph nodes within the soft tissues of the neck. CHEST The index sub- carinal lymph node 8 mm and has an SUV max equal to  2.1. Previously this measured 1.7 cm and had an SUV max equal to 2.6. Index left axillary lymph node measures 8 mm and has an SUV max equal to 1.55. Previously this measured 9 mm within SUV max of 1.9. Index right axillary lymph node measure 1.2 cm and has an SUV max equal to 1.4. Previously this measured 1.5 cm within SUV max of 1.5. There is a right cardiophrenic angle node which measures 1.1 cm and has an SUV max of 1.1. Previously 1.3 cm with an SUV max of 1.87. No hypermetabolic pulmonary nodules. ABDOMEN/PELVIS No abnormal uptake within the liver. The pancreas is unremarkable. The spleen measures 15.9 cm in length. Uptake within the spleen has an SUV max of 3.6. On the previous exam the spleen measured 20 point 8 cm and had an SUV max equal 3.85. Upper abdominal adenopathy is again identified. The index portal caval node measures 1.3 cm and has an SUV max equal to 3.09. Previously 1.7 cm with an SUV max of 4.3. The index celiac axis lymph node 0.8 cm and has an SUV max equal to 1.78. On the previous exam this measured 1.5 cm and had an SUV max equal to 3.1. The index right common femoral lymph node measures 1.2 cm and has an SUV max equal to 1.98. Previously this measured 1.4 cm and had an SUV max equal to 2.8. SKELETON No focal hypermetabolic activity to suggest skeletal metastasis. IMPRESSION: 1. Interval slight regression of borderline adenopathy within the chest, abdomen and pelvis. As before these lymph nodes show very low level FDG uptake, improved from previous exam. 2. Persistent splenomegaly, also mildly improved in the interval. 3. Again noted are 2 hypermetabolic nodules within the thyroid gland. Hypermetabolic thyroid nodules on PET have up to 40-50% incidence of malignancy; recommend further evaluation with thyroid ultrasound and possible US-guided fine needle aspiration. Electronically Signed   By: Kerby Moors M.D.   On: 07/17/2016 15:48    ASSESSMENT: CLL confirmed by peripheral blood flow  cytometry, Rai stage 2.  PLAN:    1. CLL: PET scan results from July 17, 2016 reviewed independently and reported as above revealed significant improvement of disease burden including decreased size of spleen. Patient does not require biopsy at this time, but will consider one in the future if there is any additional concern of transformation. Patient's white blood cell count is now within normal limits. She does not require any further Rituxan. Patient's last infusion of Rituxan was on May 17, 2016. Patient does not wish to have a routine surveillance with  CT scans secondary to cost. Therefore, she will return to clinic in 3 months for laboratory work only and then in 6 months for laboratory work and further evaluation.  2. Early satiety, nausea, vomiting: Resolved. Splenomegaly significantly improved with treatment. 3. Atrial fibrillation: Continue monitoring and treatment per cardiology. 4. Iron deficiency anemia: Patient's hemoglobin and iron stores are within normal limits. She last received IV Feraheme in May 2017.  5. PET positive thyroid lesions: 1.2 cm right lobe nodule also seen on thyroid ultrasound April 06, 2016. Recommendation at that time was annual follow-up.  6. Shortness breath: Patient does not complain of this today.  Patient expressed understanding and was in agreement with this plan. She also understands that She can call clinic at any time with any questions, concerns, or complaints.     Lloyd Huger, MD   07/22/2016 9:03 AM

## 2016-07-19 ENCOUNTER — Inpatient Hospital Stay (HOSPITAL_BASED_OUTPATIENT_CLINIC_OR_DEPARTMENT_OTHER): Payer: Medicare Other | Admitting: Oncology

## 2016-07-19 VITALS — BP 125/63 | HR 69 | Temp 97.1°F | Resp 18 | Wt 243.8 lb

## 2016-07-19 DIAGNOSIS — E509 Vitamin A deficiency, unspecified: Secondary | ICD-10-CM

## 2016-07-19 DIAGNOSIS — Z87891 Personal history of nicotine dependence: Secondary | ICD-10-CM

## 2016-07-19 DIAGNOSIS — R531 Weakness: Secondary | ICD-10-CM

## 2016-07-19 DIAGNOSIS — C9110 Chronic lymphocytic leukemia of B-cell type not having achieved remission: Secondary | ICD-10-CM | POA: Diagnosis not present

## 2016-07-19 DIAGNOSIS — R161 Splenomegaly, not elsewhere classified: Secondary | ICD-10-CM

## 2016-07-19 DIAGNOSIS — E041 Nontoxic single thyroid nodule: Secondary | ICD-10-CM

## 2016-07-19 DIAGNOSIS — R5383 Other fatigue: Secondary | ICD-10-CM

## 2016-07-19 DIAGNOSIS — K219 Gastro-esophageal reflux disease without esophagitis: Secondary | ICD-10-CM

## 2016-07-19 DIAGNOSIS — E785 Hyperlipidemia, unspecified: Secondary | ICD-10-CM

## 2016-07-19 DIAGNOSIS — Z8041 Family history of malignant neoplasm of ovary: Secondary | ICD-10-CM

## 2016-07-19 DIAGNOSIS — I10 Essential (primary) hypertension: Secondary | ICD-10-CM

## 2016-07-19 DIAGNOSIS — Z79899 Other long term (current) drug therapy: Secondary | ICD-10-CM

## 2016-07-19 DIAGNOSIS — Z7901 Long term (current) use of anticoagulants: Secondary | ICD-10-CM

## 2016-07-19 DIAGNOSIS — I480 Paroxysmal atrial fibrillation: Secondary | ICD-10-CM

## 2016-07-19 NOTE — Progress Notes (Signed)
Offers no complaints  

## 2016-08-03 ENCOUNTER — Telehealth: Payer: Self-pay | Admitting: *Deleted

## 2016-08-03 NOTE — Telephone Encounter (Signed)
Patient informed of MD advice and verbalized understanding

## 2016-08-03 NOTE — Telephone Encounter (Signed)
Complaining of pain in the spleen area. Recent PET 12/19 showed mild improvement of her splenomegaly. Please advise

## 2016-08-03 NOTE — Telephone Encounter (Signed)
unlikely progression of disease this quickly. Recommend symptomatic treatment and can re-evaluate next week.  If pain gets worse over weekend go to ER for evaluation.

## 2016-08-10 ENCOUNTER — Ambulatory Visit: Payer: Medicare Other | Admitting: Gastroenterology

## 2016-08-30 ENCOUNTER — Other Ambulatory Visit: Payer: Self-pay | Admitting: Gastroenterology

## 2016-09-03 ENCOUNTER — Ambulatory Visit (INDEPENDENT_AMBULATORY_CARE_PROVIDER_SITE_OTHER): Payer: Medicare Other | Admitting: Gastroenterology

## 2016-09-03 ENCOUNTER — Encounter: Payer: Self-pay | Admitting: Gastroenterology

## 2016-09-03 VITALS — BP 118/71 | HR 61 | Temp 98.0°F | Ht 67.0 in | Wt 246.4 lb

## 2016-09-03 DIAGNOSIS — R1013 Epigastric pain: Secondary | ICD-10-CM | POA: Diagnosis not present

## 2016-09-03 DIAGNOSIS — K219 Gastro-esophageal reflux disease without esophagitis: Secondary | ICD-10-CM

## 2016-09-03 DIAGNOSIS — R0989 Other specified symptoms and signs involving the circulatory and respiratory systems: Secondary | ICD-10-CM | POA: Insufficient documentation

## 2016-09-03 DIAGNOSIS — F458 Other somatoform disorders: Secondary | ICD-10-CM | POA: Diagnosis not present

## 2016-09-03 MED ORDER — PANTOPRAZOLE SODIUM 40 MG PO TBEC: 40.0000 mg | DELAYED_RELEASE_TABLET | Freq: Two times a day (BID) | ORAL | 3 refills | Status: DC

## 2016-09-03 NOTE — Progress Notes (Signed)
Primary Care Physician:  Renee Rival, NP  Primary Gastroenterologist:  Garfield Cornea, MD   Chief Complaint  Patient presents with  . Gastroesophageal Reflux    Protonix helped at first but not now, added Nexium few weeks ago. "feels like something stuck in throat". Chest hurts  . Gas    HPI:  Daisy Black is a 66 y.o. female here for follow-up, consideration of upper endoscopy. Patient has history of CLL followed by Dr. Grayland Ormond in [place]. Seen last August with worsening GERD, abdominal pain, nausea, early satiety was felt was related to worsening splenomegaly. History of chronic IDA and has required iron infusion in the past. Reports remote EGD in the 90s. History of hiatal hernia. No previous colonoscopy, not interested at this time.   Since we last saw her she did undergo Rituxan. She states the pantoprazole really helped her gerd last year. Last couple of months, started having more issues. Feels like something in the throat, chest pain with meals, epig pain, a lot of gas. Regurgitate at times. Following antireflux measures. Elevated head of bed. Waits three hours to lay down after meals. Started Nexium 20mg  with her pantoprazole 40mg  every morning two weeks ago on her own. Globus better. Other issues unchanged. BM regular. No melena, brbpr. Has to go out of town for several weeks ([place]). Son needs colon surgery related to his Crohn's/diverticula.      Current Outpatient Prescriptions  Medication Sig Dispense Refill  . acetaminophen (TYLENOL) 650 MG CR tablet Take 650 mg by mouth every 8 (eight) hours as needed for pain.     Marland Kitchen apixaban (ELIQUIS) 5 MG TABS tablet Take 1 tablet (5 mg total) by mouth 2 (two) times daily. 42 tablet 0  . cetirizine (ZYRTEC) 10 MG tablet Take 10 mg by mouth daily as needed for allergies.    Marland Kitchen esomeprazole (NEXIUM) 20 MG capsule Take 20 mg by mouth daily.    . Ferrous Fumarate (IRON) 18 MG TBCR Take 1 tablet by mouth daily.     . flecainide  (TAMBOCOR) 50 MG tablet Take 2 tablets (100 mg total) by mouth 2 (two) times daily. 180 tablet 3  . fluticasone (FLONASE) 50 MCG/ACT nasal spray Place 1 spray into both nostrils daily as needed for allergies or rhinitis.    . furosemide (LASIX) 20 MG tablet Take 1 tablet (20 mg total) by mouth daily. As needed for swelling 30 tablet 3  . levothyroxine (SYNTHROID, LEVOTHROID) 125 MCG tablet Take 125 mcg by mouth daily before breakfast.    . metoprolol tartrate (LOPRESSOR) 25 MG tablet Take 1 tablet (25 mg total) by mouth 2 (two) times daily. (Patient taking differently: Take 12.5 mg by mouth 2 (two) times daily. ) 180 tablet 3  . pantoprazole (PROTONIX) 40 MG tablet TAKE 1 TABLET (40 MG TOTAL) BY MOUTH DAILY BEFORE BREAKFAST. 30 tablet 5  . promethazine (PHENERGAN) 12.5 MG tablet Take 1 tablet (12.5 mg total) by mouth every 6 (six) hours as needed for nausea or vomiting. 30 tablet 0   No current facility-administered medications for this visit.    Facility-Administered Medications Ordered in Other Visits  Medication Dose Route Frequency Provider Last Rate Last Dose  . hydrocortisone cream 1 % 1 application  1 application Topical TID PRN Satira Sark, MD        Allergies as of 09/03/2016  . (No Known Allergies)    Past Medical History:  Diagnosis Date  . Anemia   . Arthritis   .  CLL (chronic lymphocytic leukemia) ([place])   . Essential hypertension   . GERD (gastroesophageal reflux disease)   . History of hiatal hernia   . Hyperlipidemia   . Paroxysmal atrial fibrillation ([facility])   . Splenomegaly     Past Surgical History:  Procedure Laterality Date  . ANKLE SURGERY Right    repair fracture  . CARDIOVERSION N/A 03/20/2016   Procedure: CARDIOVERSION;  Surgeon: Satira Sark, MD;  Location: [facility];  Service: Cardiovascular;  Laterality: N/A;  . CARDIOVERSION N/A 05/22/2016   Procedure: CARDIOVERSION;  Surgeon: Satira Sark, MD;  Location: [facility];  Service: Cardiovascular;   Laterality: N/A;  . CHOLECYSTECTOMY    . HERNIA REPAIR  2011   Incisional and umbilical utilizing mesh  . ROTATOR CUFF REPAIR Left   . TEE WITHOUT CARDIOVERSION N/A 01/17/2016   Procedure: TRANSESOPHAGEAL ECHOCARDIOGRAM (TEE);  Surgeon: Herminio Commons, MD;  Location: [facility];  Service: Cardiovascular;  Laterality: N/A;  . TEE WITHOUT CARDIOVERSION N/A 02/21/2016   Procedure: TRANSESOPHAGEAL ECHOCARDIOGRAM (TEE) WITH PROPOFOL;  Surgeon: Herminio Commons, MD;  Location: [facility];  Service: Cardiovascular;  Laterality: N/A;  . TEE WITHOUT CARDIOVERSION N/A 03/20/2016   Procedure: TRANSESOPHAGEAL ECHOCARDIOGRAM (TEE) WITH PROPOFOL;  Surgeon: Satira Sark, MD;  Location: [facility];  Service: Cardiovascular;  Laterality: N/A;  . TONSILLECTOMY      Family History  Problem Relation Age of Onset  . Ovarian cancer Mother 54    Secondary to ovarian cancer  . Leukemia Mother   . Stroke Father 64    Brain stem infarction  . Breast cancer Paternal Aunt     Social History   Social History  . Marital status: Married    Spouse name: N/A  . Number of children: 78  . Years of education: N/A   Occupational History  . Not on file.   Social History Main Topics  . Smoking status: Former Smoker    Packs/day: 0.25    Years: 2.00    Types: Cigarettes    Quit date: 08/03/1975  . Smokeless tobacco: Never Used  . Alcohol use No  . Drug use: No  . Sexual activity: Yes    Birth control/ protection: Post-menopausal   Other Topics Concern  . Not on file   Social History Narrative  . No narrative on file      ROS:  General: Negative for anorexia, weight loss, fever, chills, fatigue, weakness.weight up 20 pounds since 04/2016 Eyes: Negative for vision changes.  ENT: Negative for hoarseness, difficulty swallowing , nasal congestion. See hpi CV: Negative for chest pain, angina, palpitations, dyspnea on exertion, peripheral edema.  Respiratory: Negative for dyspnea at rest, dyspnea on  exertion, cough, sputum, wheezing.  GI: See history of present illness. GU:  Negative for dysuria, hematuria, urinary incontinence, urinary frequency, nocturnal urination.  MS: Negative for joint pain, low back pain.  Derm: Negative for rash or itching.  Neuro: Negative for weakness, abnormal sensation, seizure, frequent headaches, memory loss, confusion.  Psych: Negative for anxiety, depression, suicidal ideation, hallucinations.  Endo: Negative for unusual weight change.  Heme: Negative for bruising or bleeding. Allergy: Negative for rash or hives.    Physical Examination:  BP 118/71   Pulse 61   Temp 98 F (36.7 C) (Oral)   Ht 5\' 7"  (1.702 m)   Wt 246 lb 6.4 oz (111.8 kg)   BMI 38.59 kg/m    General: Well-nourished, well-developed in no acute distress.  Head: Normocephalic, atraumatic.  Eyes: Conjunctiva pink, no icterus. Mouth: Oropharyngeal mucosa moist and pink , no lesions erythema or exudate. Neck: Supple without thyromegaly, masses, or lymphadenopathy.  Lungs: Clear to auscultation bilaterally.  Heart: Regular rate and rhythm, no murmurs rubs or gallops.  Abdomen: Bowel sounds are normal, nontender, nondistended, no hepatosplenomegaly or masses, no abdominal bruits or    hernia , no rebound or guarding.   Rectal: not performed Extremities: No lower extremity edema. No clubbing or deformities.  Neuro: Alert and oriented x 4 , grossly normal neurologically.  Skin: Warm and dry, no rash or jaundice.   Psych: Alert and cooperative, normal mood and affect.  Labs: Lab Results  Component Value Date   WBC 6.5 07/17/2016   HGB 13.9 07/17/2016   HCT 41.4 07/17/2016   MCV 90.3 07/17/2016   PLT 109 (L) 07/17/2016   Lab Results  Component Value Date   CREATININE 0.70 07/17/2016   BUN 16 07/17/2016   NA 140 07/17/2016   K 3.5 07/17/2016   CL 107 07/17/2016   CO2 27 07/17/2016   Lab Results  Component Value Date   IRON 95 07/17/2016   TIBC 314 07/17/2016    FERRITIN 27 07/17/2016   Lab Results  Component Value Date   ALT 13 (L) 07/17/2016   AST 17 07/17/2016   ALKPHOS 80 07/17/2016   BILITOT 0.9 07/17/2016     Imaging Studies: No results found.

## 2016-09-03 NOTE — Patient Instructions (Signed)
1. Increase pantoprazole to 40mg  twice daily, before breakfast and before your evening meal over the next couple of months.  2. Barium xray of esophagus and stomach as scheduled. We will contact you with results within 5-7 business days.

## 2016-09-03 NOTE — Progress Notes (Signed)
CC'ED TO PCP 

## 2016-09-03 NOTE — Assessment & Plan Note (Signed)
66 year old female with history of CLL, splenomegaly, upper abdominal lymphadenopathy last seen in August 2017 who presents for follow-up of upper GI complaints. Since we last saw her she underwent Rituxan therapy. She has had some regression of her splenomegaly and lymphadenopathy. She has known thyroid nodules with some activity on PET being followed by Dr. Grayland Ormond as well. Early satiety has improved. Last couple months she's had flare of her reflux, regurgitation, atypical chest pain, globus sensation, epigastric pain. 20 pound weight gain noted. Globus improved with increased PPI for past two weeks. Discussed EGD option, need to hold Eliquis but patient is apprehensive and would like to pursue BPE/UGI series. We will increase pantoprazole to BID. Stop nexium. Further recommendations to follow.   Of note, patient is not interested in colonoscopy right now.

## 2016-09-06 ENCOUNTER — Other Ambulatory Visit (HOSPITAL_COMMUNITY): Payer: Medicare Other

## 2016-09-06 ENCOUNTER — Ambulatory Visit (HOSPITAL_COMMUNITY): Payer: Medicare Other

## 2016-09-07 ENCOUNTER — Telehealth: Payer: Self-pay | Admitting: Cardiology

## 2016-09-07 ENCOUNTER — Ambulatory Visit (HOSPITAL_COMMUNITY): Admission: RE | Admit: 2016-09-07 | Payer: Medicare Other | Source: Ambulatory Visit

## 2016-09-07 ENCOUNTER — Ambulatory Visit (HOSPITAL_COMMUNITY)
Admission: RE | Admit: 2016-09-07 | Discharge: 2016-09-07 | Disposition: A | Discharge status: Home / Self Care | Payer: Medicare Other | Source: Ambulatory Visit | Attending: Gastroenterology | Admitting: Gastroenterology

## 2016-09-07 DIAGNOSIS — R1013 Epigastric pain: Secondary | ICD-10-CM | POA: Insufficient documentation

## 2016-09-07 DIAGNOSIS — K219 Gastro-esophageal reflux disease without esophagitis: Secondary | ICD-10-CM | POA: Diagnosis present

## 2016-09-07 DIAGNOSIS — F458 Other somatoform disorders: Secondary | ICD-10-CM | POA: Diagnosis not present

## 2016-09-07 DIAGNOSIS — R0989 Other specified symptoms and signs involving the circulatory and respiratory systems: Secondary | ICD-10-CM

## 2016-09-07 NOTE — Telephone Encounter (Signed)
30 day supply given,Eliquis 5 mg, lot [id number], exp 12/2018  Pt aware

## 2016-09-11 NOTE — Progress Notes (Signed)
Please let patient know that there is no evidence of esophageal stricture or esophagitis or stomach ulcer.  Recommendation as per OV, we increased her pantoprazole to BID.  Call if next few weeks if symptoms do not improve.

## 2016-09-12 ENCOUNTER — Telehealth: Payer: Self-pay | Admitting: Internal Medicine

## 2016-09-12 NOTE — Telephone Encounter (Signed)
Patient wants someone to call her about the results from her swallowing test last week, she can not pull up my chart

## 2016-09-17 NOTE — Telephone Encounter (Signed)
Pt is aware of her results.  

## 2016-10-17 ENCOUNTER — Inpatient Hospital Stay: Payer: Medicare Other

## 2016-10-23 ENCOUNTER — Ambulatory Visit: Payer: Medicare Other | Admitting: Cardiology

## 2016-10-29 ENCOUNTER — Telehealth: Payer: Self-pay

## 2016-10-29 NOTE — Telephone Encounter (Signed)
Pt of Dr Domenic Polite, c/o pulse jumping from 60 to 1, then [phone],94,85 and she is concerned. I spoke with Dr Johnsie Cancel , DOD and he said it will jump around with A-fib and to just monitor. I will inform patient and she was appeased.

## 2016-10-31 ENCOUNTER — Other Ambulatory Visit: Payer: Self-pay

## 2016-10-31 MED ORDER — METOPROLOL TARTRATE 25 MG PO TABS: 12.5000 mg | ORAL_TABLET | Freq: Two times a day (BID) | ORAL | 3 refills | Status: DC

## 2016-11-15 ENCOUNTER — Ambulatory Visit: Payer: Medicare Other | Admitting: Cardiology

## 2016-12-05 ENCOUNTER — Ambulatory Visit: Payer: Medicare Other | Admitting: Cardiology

## 2016-12-21 ENCOUNTER — Encounter: Payer: Self-pay | Admitting: Cardiology

## 2016-12-21 ENCOUNTER — Ambulatory Visit (INDEPENDENT_AMBULATORY_CARE_PROVIDER_SITE_OTHER): Payer: Medicare Other | Admitting: Cardiology

## 2016-12-21 VITALS — BP 122/62 | HR 65 | Ht 67.5 in | Wt 255.0 lb

## 2016-12-21 DIAGNOSIS — I10 Essential (primary) hypertension: Secondary | ICD-10-CM | POA: Diagnosis not present

## 2016-12-21 DIAGNOSIS — R600 Localized edema: Secondary | ICD-10-CM | POA: Diagnosis not present

## 2016-12-21 DIAGNOSIS — I480 Paroxysmal atrial fibrillation: Secondary | ICD-10-CM | POA: Diagnosis not present

## 2016-12-21 DIAGNOSIS — R001 Bradycardia, unspecified: Secondary | ICD-10-CM

## 2016-12-21 MED ORDER — APIXABAN 5 MG PO TABS: 5.0000 mg | ORAL_TABLET | Freq: Two times a day (BID) | ORAL | 0 refills | Status: DC

## 2016-12-21 NOTE — Progress Notes (Signed)
Cardiology Office Note  Date: 12/21/2016   ID: Little, Bashore [birth year], MRN [id number]  PCP: Renee Rival, NP  Primary Cardiologist: Rozann Lesches, MD   Chief Complaint  Patient presents with  . Atrial Fibrillation    History of Present Illness: CORBIN HOTT is a 66 y.o. female last seen in December 2017. She is here today with her daughter for a follow-up visit. Overall she has done very well, no obvious recurrent persistent atrial fibrillation in the last 6 months. She does feel occasional skips and heart rate irregularity but nothing prolonged. She does worry a lot about her heart rhythm however.  At the last visit we reduced Lopressor to 25 mg twice daily. She also was provided Lasix to use as needed for leg swelling. She is tolerating her medicines well. No bleeding problems on Eliquis, we are requesting her most recent lab work per PCP.  She has been undergoing left knee joint injections for pain control of arthritis.  Past Medical History:  Diagnosis Date  . Anemia   . Arthritis   . CLL (chronic lymphocytic leukemia) ([place])   . Essential hypertension   . GERD (gastroesophageal reflux disease)   . History of hiatal hernia   . Hyperlipidemia   . Paroxysmal atrial fibrillation (H[facility])   . Splenomegaly     Past Surgical History:  Procedure Laterality Date  . ANKLE SURGERY Right    repair fracture  . CARDIOVERSION N/A 03/20/2016   Procedure: CARDIOVERSION;  Surgeon: Satira Sark, MD;  Location: [facility];  Service: Cardiovascular;  Laterality: N/A;  . CARDIOVERSION N/A 05/22/2016   Procedure: CARDIOVERSION;  Surgeon: Satira Sark, MD;  Location: [facility];  Service: Cardiovascular;  Laterality: N/A;  . CHOLECYSTECTOMY    . HERNIA REPAIR  2011   Incisional and umbilical utilizing mesh  . ROTATOR CUFF REPAIR Left   . TEE WITHOUT CARDIOVERSION N/A 01/17/2016   Procedure: TRANSESOPHAGEAL ECHOCARDIOGRAM (TEE);  Surgeon: Herminio Commons, MD;   Location: [facility];  Service: Cardiovascular;  Laterality: N/A;  . TEE WITHOUT CARDIOVERSION N/A 02/21/2016   Procedure: TRANSESOPHAGEAL ECHOCARDIOGRAM (TEE) WITH PROPOFOL;  Surgeon: Herminio Commons, MD;  Location: [facility];  Service: Cardiovascular;  Laterality: N/A;  . TEE WITHOUT CARDIOVERSION N/A 03/20/2016   Procedure: TRANSESOPHAGEAL ECHOCARDIOGRAM (TEE) WITH PROPOFOL;  Surgeon: Satira Sark, MD;  Location: [facility];  Service: Cardiovascular;  Laterality: N/A;  . TONSILLECTOMY      Current Outpatient Prescriptions  Medication Sig Dispense Refill  . acetaminophen (TYLENOL) 650 MG CR tablet Take 650 mg by mouth every 8 (eight) hours as needed for pain.     Marland Kitchen apixaban (ELIQUIS) 5 MG TABS tablet Take 1 tablet (5 mg total) by mouth 2 (two) times daily. 42 tablet 0  . cetirizine (ZYRTEC) 10 MG tablet Take 10 mg by mouth daily as needed for allergies.    Marland Kitchen esomeprazole (NEXIUM) 20 MG capsule Take 20 mg by mouth daily.    . Ferrous Fumarate (IRON) 18 MG TBCR Take 1 tablet by mouth daily.     . flecainide (TAMBOCOR) 50 MG tablet Take 2 tablets (100 mg total) by mouth 2 (two) times daily. 180 tablet 3  . fluticasone (FLONASE) 50 MCG/ACT nasal spray Place 1 spray into both nostrils daily as needed for allergies or rhinitis.    Marland Kitchen levothyroxine (SYNTHROID, LEVOTHROID) 125 MCG tablet Take 125 mcg by mouth daily before breakfast.    . metoprolol tartrate (LOPRESSOR) 25 MG  tablet Take 0.5 tablets (12.5 mg total) by mouth 2 (two) times daily. 90 tablet 3  . pantoprazole (PROTONIX) 40 MG tablet Take 1 tablet (40 mg total) by mouth 2 (two) times daily before a meal. 60 tablet 3  . promethazine (PHENERGAN) 12.5 MG tablet Take 1 tablet (12.5 mg total) by mouth every 6 (six) hours as needed for nausea or vomiting. 30 tablet 0  . furosemide (LASIX) 20 MG tablet Take 1 tablet (20 mg total) by mouth daily. As needed for swelling 30 tablet 3   No current facility-administered medications for this visit.     Facility-Administered Medications Ordered in Other Visits  Medication Dose Route Frequency Provider Last Rate Last Dose  . hydrocortisone cream 1 % 1 application  1 application Topical TID PRN Satira Sark, MD       Allergies:  Patient has no known allergies.   Social History: The patient  reports that she quit smoking about 41 years ago. Her smoking use included Cigarettes. She has a 0.50 pack-year smoking history. She has never used smokeless tobacco. She reports that she does not drink alcohol or use drugs.   ROS:  Please see the history of present illness. Otherwise, complete review of systems is positive for arthritic knee pain.  All other systems are reviewed and negative.   Physical Exam: VS:  BP 122/62   Pulse 65   Ht 5' 7.5" (1.715 m)   Wt 255 lb (115.7 kg)   SpO2 97%   BMI 39.35 kg/m , BMI Body mass index is 39.35 kg/m.  Wt Readings from Last 3 Encounters:  12/21/16 255 lb (115.7 kg)  09/03/16 246 lb 6.4 oz (111.8 kg)  07/19/16 243 lb 13.3 oz (110.6 kg)    General: Patient appears comfortable at rest. HEENT: Conjunctiva and lids normal, oropharynx clear with moist mucosa. Neck: Supple, no elevated JVP or carotid bruits, no thyromegaly. Lungs: Clear to auscultation, nonlabored breathing at rest. Cardiac: Regular rate and rhythm, no S3 or significant systolic murmur, no pericardial rub. Abdomen: Soft, nontender, no hepatomegaly, bowel sounds present, no guarding or rebound. Extremities: 1+ leg edema, distal pulses 2+. Skin: Warm and dry. Resolving petechial rash on the right forearm. Musculoskeletal: No kyphosis. Neuropsychiatric: Alert and oriented 3, affect appropriate.  ECG: I personally reviewed the tracing from 06/01/2016 which showed sinus bradycardia with PACs.  Recent Labwork: 01/12/2016: Magnesium 2.2; TSH 1.986 07/17/2016: ALT 13; AST 17; BUN 16; Creatinine, Ser 0.70; Hemoglobin 13.9; Platelets 109; Potassium 3.5; Sodium 140   Other Studies  Reviewed Today:  TEE 03/20/2016: Study Conclusions  - Left ventricle: Systolic function was normal. The estimated ejection fraction was in the range of 60% to 65%. Wall motion was normal; there were no regional wall motion abnormalities. No evidence of thrombus. - Descending aorta: The descending aorta had minor luminal irregularities. - Mitral valve: There was mild regurgitation. - Left atrium: The atrium was mildly dilated. No evidence of thrombus in the atrial cavity or appendage. Prominent trabeculation visualized at tip of appendage. Emptying velocity was moderately reduced. - Right atrium: No evidence of thrombus in the atrial cavity or appendage. - Tricuspid valve: There was mild regurgitation. - Pericardium, extracardiac: There was no pericardial effusion.  Impressions:  - LVEF 60-65% without wall motion abnormalities. No left atrial appendage thrombus visualized. There is prominent trabeculation noted at the appendage tip. Emptying velocity was moderately reduced. No right atrial appendage thrombus noted. Mild tricuspid regurgitation.  Assessment and Plan:  1. Paroxysmal atrial  fibrillation, rhythm has been well controlled over the last several months on flecainide, Lopressor, and with stroke prophylaxis on Eliquis. Requesting most recent lab work for review.  2. Bradycardia, heart rate has come up into the 60s at baseline with reduction in beta blocker dose. She is tolerating this well.  3. Intermittent leg edema, uses Lasix as needed. This has been stable.  4. Essential hypertension, blood pressure well controlled today.  Current medicines were reviewed with the patient today.  Disposition: Follow-up in 6 months.  Signed, Satira Sark, MD, [facility] 12/21/2016 2:39 PM    [facility] [place], [place], [place] [address] Phone: [phone]; Fax: [phone]

## 2016-12-21 NOTE — Patient Instructions (Signed)

## 2017-01-17 ENCOUNTER — Ambulatory Visit: Payer: Medicare Other | Admitting: Oncology

## 2017-01-17 ENCOUNTER — Other Ambulatory Visit: Payer: Medicare Other

## 2017-01-31 ENCOUNTER — Other Ambulatory Visit: Payer: Self-pay | Admitting: Cardiology

## 2017-03-19 ENCOUNTER — Other Ambulatory Visit: Payer: Self-pay | Admitting: Internal Medicine

## 2017-03-19 NOTE — Telephone Encounter (Signed)
This is Dr. McDowell's pt 

## 2017-04-08 ENCOUNTER — Telehealth: Payer: Self-pay | Admitting: Cardiology

## 2017-04-08 NOTE — Telephone Encounter (Signed)
Called pt. No answer. Left message for pt to return call.  

## 2017-04-08 NOTE — Telephone Encounter (Signed)
Needs RX for Eliquis 5mg  to pick up so she can get it from mail order in [place]. Also wants to know if we have any samples she can have til then./ tg

## 2017-04-09 MED ORDER — APIXABAN 5 MG PO TABS: 5.0000 mg | ORAL_TABLET | Freq: Two times a day (BID) | ORAL | 3 refills | Status: DC

## 2017-04-09 NOTE — Telephone Encounter (Signed)
Called pt., her phone seems to be having trouble getting signal as I cannot understand what she is saying. I put her new RX and samples in front office.

## 2017-05-11 ENCOUNTER — Other Ambulatory Visit: Payer: Self-pay | Admitting: Cardiology

## 2017-05-13 NOTE — Progress Notes (Signed)
Daisy [facility]  [phone] [phone]  ID: Daisy Black OB: March 21, 1951  MR#: [id number]  CXK#:[id number]  Patient Care Team: Renee Rival, NP as PCP - General (Nurse Practitioner) Aviva Signs, MD (General Surgery) Rico Junker, RN as Registered Nurse Theodore Demark, RN as Registered Nurse Satira Sark, MD as Consulting Physician (Cardiology) Daneil Dolin, MD as Consulting Physician (Gastroenterology)  CHIEF COMPLAINT: CLL  INTERVAL HISTORY: Patient last evaluated in clinic in January 2018. She has chronic weakness and fatigue, but otherwise feels well. She does not complain of shortness of breath or chest pain. She no longer complains of early satiety, reflux, or nausea. She continues to be anxious. She has no fevers, chills, or night sweats. She denies any constipation or diarrhea. She has no melanoma or hematochezia. Patient offers no further specific complaints today.  REVIEW OF SYSTEMS:   Review of Systems  Constitutional: Positive for malaise/fatigue. Negative for diaphoresis, fever and weight loss.  Respiratory: Negative.  Negative for cough and shortness of breath.   Cardiovascular: Negative.  Negative for chest pain and leg swelling.  Gastrointestinal: Negative.  Negative for abdominal pain, blood in stool, constipation, diarrhea, heartburn, melena, nausea and vomiting.  Genitourinary: Negative.   Musculoskeletal: Negative.   Neurological: Positive for weakness.  Psychiatric/Behavioral: The patient is nervous/anxious.     As per HPI. Otherwise, a complete review of systems is negative.  PAST MEDICAL HISTORY: Past Medical History:  Diagnosis Date  . Anemia   . Arthritis   . CLL (chronic lymphocytic leukemia) ([place])   . Essential hypertension   . GERD (gastroesophageal reflux disease)   . History of hiatal hernia   . Hyperlipidemia   . Paroxysmal atrial fibrillation (HCC)   . Splenomegaly     PAST  SURGICAL HISTORY: Past Surgical History:  Procedure Laterality Date  . ANKLE SURGERY Right    repair fracture  . CARDIOVERSION N/A 03/20/2016   Procedure: CARDIOVERSION;  Surgeon: Satira Sark, MD;  Location: [facility];  Service: Cardiovascular;  Laterality: N/A;  . CARDIOVERSION N/A 05/22/2016   Procedure: CARDIOVERSION;  Surgeon: Satira Sark, MD;  Location: [facility];  Service: Cardiovascular;  Laterality: N/A;  . CHOLECYSTECTOMY    . HERNIA REPAIR  2011   Incisional and umbilical utilizing mesh  . ROTATOR CUFF REPAIR Left   . TEE WITHOUT CARDIOVERSION N/A 01/17/2016   Procedure: TRANSESOPHAGEAL ECHOCARDIOGRAM (TEE);  Surgeon: Herminio Commons, MD;  Location: [facility];  Service: Cardiovascular;  Laterality: N/A;  . TEE WITHOUT CARDIOVERSION N/A 02/21/2016   Procedure: TRANSESOPHAGEAL ECHOCARDIOGRAM (TEE) WITH PROPOFOL;  Surgeon: Herminio Commons, MD;  Location: [facility];  Service: Cardiovascular;  Laterality: N/A;  . TEE WITHOUT CARDIOVERSION N/A 03/20/2016   Procedure: TRANSESOPHAGEAL ECHOCARDIOGRAM (TEE) WITH PROPOFOL;  Surgeon: Satira Sark, MD;  Location: [facility];  Service: Cardiovascular;  Laterality: N/A;  . TONSILLECTOMY      FAMILY HISTORY Family History  Problem Relation Age of Onset  . Ovarian cancer Mother 43       Secondary to ovarian cancer  . Leukemia Mother   . Stroke Father 24       Brain stem infarction  . Breast cancer Paternal Aunt        ADVANCED DIRECTIVES:    HEALTH MAINTENANCE: Social History  Substance Use Topics  . Smoking status: Former Smoker    Packs/day: 0.25    Years: 2.00    Types: Cigarettes    Quit date: 08/03/1975  .  Smokeless tobacco: Never Used  . Alcohol use No     Colonoscopy:  PAP:  Bone density:  Lipid panel:  No Known Allergies  Current Outpatient Prescriptions  Medication Sig Dispense Refill  . acetaminophen (TYLENOL) 650 MG CR tablet Take 650 mg by mouth every 8 (eight) hours as needed for pain.     Marland Kitchen  apixaban (ELIQUIS) 5 MG TABS tablet Take 1 tablet (5 mg total) by mouth 2 (two) times daily. 90 tablet 3  . cetirizine (ZYRTEC) 10 MG tablet Take 10 mg by mouth daily as needed for allergies.    Marland Kitchen esomeprazole (NEXIUM) 20 MG capsule Take 20 mg by mouth daily.    . Ferrous Fumarate (IRON) 18 MG TBCR Take 1 tablet by mouth daily.     . flecainide (TAMBOCOR) 50 MG tablet TAKE 1 TABLET (50 MG TOTAL) BY MOUTH 2 (TWO) TIMES DAILY. 180 tablet 3  . fluticasone (FLONASE) 50 MCG/ACT nasal spray Place 1 spray into both nostrils daily as needed for allergies or rhinitis.    . furosemide (LASIX) 20 MG tablet TAKE 1 TABLET BY MOUTH DAILY AS NEEDED FOR SWELLING 30 tablet 6  . HYDROcodone-acetaminophen (NORCO/VICODIN) 5-325 MG tablet TAKE ONE TABLET BY MOUTH EVERY 8 HOURS AS NEEDED FOR SEVERE POSTPROCEDURAL PAIN  0  . levothyroxine (SYNTHROID, LEVOTHROID) 125 MCG tablet Take 125 mcg by mouth daily before breakfast.    . pantoprazole (PROTONIX) 40 MG tablet Take 1 tablet (40 mg total) by mouth 2 (two) times daily before a meal. 60 tablet 3  . metoprolol tartrate (LOPRESSOR) 25 MG tablet Take 0.5 tablets (12.5 mg total) by mouth 2 (two) times daily. 90 tablet 3  . promethazine (PHENERGAN) 12.5 MG tablet Take 1 tablet (12.5 mg total) by mouth every 6 (six) hours as needed for nausea or vomiting. (Patient not taking: Reported on 05/15/2017) 30 tablet 0   No current facility-administered medications for this visit.    Facility-Administered Medications Ordered in Other Visits  Medication Dose Route Frequency Provider Last Rate Last Dose  . hydrocortisone cream 1 % 1 application  1 application Topical TID PRN Satira Sark, MD        OBJECTIVE: Vitals:   05/15/17 1512  BP: 130/70  Pulse: 73  Resp: 18  Temp: (!) 97.2 F (36.2 C)     Body mass index is 40.51 kg/m.    ECOG FS:0 - Asymptomatic  General: Well-developed, well-nourished, no acute distress. Eyes: Pink conjunctiva, anicteric sclera. HEENT:  Minimally palpable posterior right cervical node. Lungs: Clear to auscultation bilaterally. Heart: Regular rate and rhythm. No rubs, murmurs, or gallops. Abdomen: Soft, nontender, nondistended. Splenomegaly noted, normoactive bowel sounds. Musculoskeletal: No edema, cyanosis, or clubbing. Neuro: Alert, answering all questions appropriately. Cranial nerves grossly intact. Skin: No rashes or petechiae noted. Psych: Normal affect.  LAB RESULTS:  Lab Results  Component Value Date   NA 140 07/17/2016   K 3.5 07/17/2016   CL 107 07/17/2016   CO2 27 07/17/2016   GLUCOSE 96 07/17/2016   BUN 16 07/17/2016   CREATININE 0.70 07/17/2016   CALCIUM 9.9 07/17/2016   PROT 6.4 (L) 07/17/2016   ALBUMIN 3.9 07/17/2016   AST 17 07/17/2016   ALT 13 (L) 07/17/2016   ALKPHOS 80 07/17/2016   BILITOT 0.9 07/17/2016   GFRNONAA >60 07/17/2016   GFRAA >60 07/17/2016    Lab Results  Component Value Date   WBC 24.6 (H) 05/15/2017   NEUTROABS 3.5 05/15/2017   HGB 14.0 05/15/2017  HCT 42.9 05/15/2017   MCV 94.2 05/15/2017   PLT 123 (L) 05/15/2017     STUDIES: No results found.  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  PLAN:    1. CLL: PET scan results from July 17, 2016 reviewed independently and reported as above revealed significant improvement of disease burden including decreased size of spleen. Patient's last infusion of Rituxan was on May 17, 2016. Patient has agreed to pursue repeat imaging with CT scan in the next 1-2 weeks. Her white count has also trended up and is now 24.6. Patient wishes to be called with her CT results and will follow-up if any intervention is needed. If no treatment is needed, patient will return to clinic in 6 months for repeat laboratory work and further evaluation.   2. Early satiety, nausea, vomiting: Resolved. Splenomegaly significantly improved with treatment. 3. Atrial fibrillation: Continue monitoring and treatment  per cardiology. 4. Iron deficiency anemia: Patient's hemoglobin and iron stores are within normal limits. She last received IV Feraheme in May 2017.  5. PET positive thyroid lesions: 1.2 cm right lobe nodule also seen on thyroid ultrasound April 06, 2016. Recommendation at that time was annual follow-up.  6. Shortness breath: Patient does not complain of this today.  Patient expressed understanding and was in agreement with this plan. She also understands that She can call clinic at any time with any questions, concerns, or complaints.     Lloyd Huger, MD   05/15/2017 4:38 PM

## 2017-05-15 ENCOUNTER — Other Ambulatory Visit: Payer: Self-pay

## 2017-05-15 ENCOUNTER — Inpatient Hospital Stay: Payer: Medicare Other | Attending: Oncology | Admitting: Oncology

## 2017-05-15 ENCOUNTER — Inpatient Hospital Stay: Payer: Medicare Other

## 2017-05-15 VITALS — BP 130/70 | HR 73 | Temp 97.2°F | Resp 18 | Wt 262.5 lb

## 2017-05-15 DIAGNOSIS — E785 Hyperlipidemia, unspecified: Secondary | ICD-10-CM | POA: Insufficient documentation

## 2017-05-15 DIAGNOSIS — I480 Paroxysmal atrial fibrillation: Secondary | ICD-10-CM | POA: Diagnosis not present

## 2017-05-15 DIAGNOSIS — Z87891 Personal history of nicotine dependence: Secondary | ICD-10-CM | POA: Diagnosis not present

## 2017-05-15 DIAGNOSIS — K449 Diaphragmatic hernia without obstruction or gangrene: Secondary | ICD-10-CM | POA: Insufficient documentation

## 2017-05-15 DIAGNOSIS — C9110 Chronic lymphocytic leukemia of B-cell type not having achieved remission: Secondary | ICD-10-CM

## 2017-05-15 DIAGNOSIS — Z79899 Other long term (current) drug therapy: Secondary | ICD-10-CM | POA: Diagnosis not present

## 2017-05-15 DIAGNOSIS — I10 Essential (primary) hypertension: Secondary | ICD-10-CM | POA: Diagnosis not present

## 2017-05-15 DIAGNOSIS — K219 Gastro-esophageal reflux disease without esophagitis: Secondary | ICD-10-CM | POA: Diagnosis not present

## 2017-05-15 DIAGNOSIS — R161 Splenomegaly, not elsewhere classified: Secondary | ICD-10-CM | POA: Insufficient documentation

## 2017-05-15 LAB — CBC WITH DIFFERENTIAL/PLATELET
Basophils Absolute: 0 10*3/uL (ref 0–0.1)
Basophils Relative: 0 %
EOS PCT: 1 %
Eosinophils Absolute: 0.2 10*3/uL (ref 0–0.7)
HCT: 42.9 % (ref 35.0–47.0)
Hemoglobin: 14 g/dL (ref 12.0–16.0)
Lymphocytes Relative: 83 %
Lymphs Abs: 20.3 10*3/uL — ABNORMAL HIGH (ref 1.0–3.6)
MCH: 30.8 pg (ref 26.0–34.0)
MCHC: 32.7 g/dL (ref 32.0–36.0)
MCV: 94.2 fL (ref 80.0–100.0)
MONO ABS: 0.6 10*3/uL (ref 0.2–0.9)
Monocytes Relative: 2 %
NEUTROS PCT: 14 %
Neutro Abs: 3.5 10*3/uL (ref 1.4–6.5)
PLATELETS: 123 10*3/uL — AB (ref 150–440)
RBC: 4.56 MIL/uL (ref 3.80–5.20)
RDW: 14.1 % (ref 11.5–14.5)
WBC: 24.6 10*3/uL — ABNORMAL HIGH (ref 3.6–11.0)

## 2017-05-15 NOTE — Progress Notes (Signed)
Pt in today for follow up. Expresses 'tired all the time.'

## 2017-05-23 ENCOUNTER — Ambulatory Visit
Admission: RE | Admit: 2017-05-23 | Discharge: 2017-05-23 | Disposition: A | Discharge status: Home / Self Care | Payer: Medicare Other | Source: Ambulatory Visit | Attending: Oncology | Admitting: Oncology

## 2017-05-23 DIAGNOSIS — R161 Splenomegaly, not elsewhere classified: Secondary | ICD-10-CM | POA: Insufficient documentation

## 2017-05-23 DIAGNOSIS — R591 Generalized enlarged lymph nodes: Secondary | ICD-10-CM | POA: Insufficient documentation

## 2017-05-23 DIAGNOSIS — K5730 Diverticulosis of large intestine without perforation or abscess without bleeding: Secondary | ICD-10-CM | POA: Diagnosis not present

## 2017-05-23 DIAGNOSIS — M461 Sacroiliitis, not elsewhere classified: Secondary | ICD-10-CM | POA: Insufficient documentation

## 2017-05-23 DIAGNOSIS — M5137 Other intervertebral disc degeneration, lumbosacral region: Secondary | ICD-10-CM | POA: Diagnosis not present

## 2017-05-23 DIAGNOSIS — C9110 Chronic lymphocytic leukemia of B-cell type not having achieved remission: Secondary | ICD-10-CM | POA: Insufficient documentation

## 2017-05-23 DIAGNOSIS — E041 Nontoxic single thyroid nodule: Secondary | ICD-10-CM | POA: Insufficient documentation

## 2017-05-23 DIAGNOSIS — C9190 Lymphoid leukemia, unspecified not having achieved remission: Secondary | ICD-10-CM | POA: Diagnosis present

## 2017-05-23 LAB — POCT I-STAT CREATININE: Creatinine, Ser: 0.8 mg/dL (ref 0.44–1.00)

## 2017-05-23 MED ORDER — IOPAMIDOL (ISOVUE-370) INJECTION 76%
100.0000 mL | Freq: Once | INTRAVENOUS | Status: AC | PRN
Start: 2017-05-23 — End: 2017-05-23
  Administered 2017-05-23: 100 mL via INTRAVENOUS

## 2017-05-31 ENCOUNTER — Other Ambulatory Visit: Payer: Self-pay | Admitting: *Deleted

## 2017-05-31 DIAGNOSIS — C9110 Chronic lymphocytic leukemia of B-cell type not having achieved remission: Secondary | ICD-10-CM

## 2017-06-25 ENCOUNTER — Telehealth: Payer: Self-pay | Admitting: Cardiology

## 2017-06-25 MED ORDER — APIXABAN 5 MG PO TABS: 5.0000 mg | ORAL_TABLET | Freq: Two times a day (BID) | ORAL | 3 refills | Status: DC

## 2017-06-25 NOTE — Telephone Encounter (Signed)
Got new RX for patient. Will place samples out front for pt.

## 2017-06-25 NOTE — Telephone Encounter (Signed)
Pt is needing a Rx for her apixaban (ELIQUIS) 5 MG TABS tablet [[id number]] --she's needing to send it to [place], will come and pick it up tomorrow. Also would like some samples.   She has an apt w/ SM on 07/09/17

## 2017-07-09 ENCOUNTER — Ambulatory Visit: Payer: Medicare Other | Admitting: Cardiology

## 2017-07-19 ENCOUNTER — Encounter: Payer: Self-pay | Admitting: Gastroenterology

## 2017-07-19 ENCOUNTER — Other Ambulatory Visit: Payer: Self-pay | Admitting: *Deleted

## 2017-07-19 ENCOUNTER — Encounter: Payer: Self-pay | Admitting: *Deleted

## 2017-07-19 ENCOUNTER — Ambulatory Visit (INDEPENDENT_AMBULATORY_CARE_PROVIDER_SITE_OTHER): Payer: Medicare Other | Admitting: Gastroenterology

## 2017-07-19 DIAGNOSIS — K625 Hemorrhage of anus and rectum: Secondary | ICD-10-CM | POA: Insufficient documentation

## 2017-07-19 MED ORDER — HYDROCORTISONE 2.5 % RE CREA: 1.0000 "application " | TOPICAL_CREAM | Freq: Two times a day (BID) | RECTAL | 1 refills | Status: DC

## 2017-07-19 MED ORDER — NYSTATIN 100000 UNIT/GM EX POWD: Freq: Two times a day (BID) | CUTANEOUS | 1 refills | Status: DC

## 2017-07-19 MED ORDER — NA SULFATE-K SULFATE-MG SULF 17.5-3.13-1.6 GM/177ML PO SOLN: 1.0000 | ORAL | 0 refills | Status: DC

## 2017-07-19 NOTE — Patient Instructions (Addendum)
I have sent in a cream to take twice a day. Continue to avoid constipation. We are scheduling you for a colonoscopy in the near future with Dr. Gala Romney.  Please hold the Eliquis for 48 hours prior to the procedure.  I have also sent in a powder to use on your abdomen as needed.    Hemorrhoids Hemorrhoids are swollen veins in and around the rectum or anus. There are two types of hemorrhoids:  Internal hemorrhoids. These occur in the veins that are just inside the rectum. They may poke through to the outside and become irritated and painful.  External hemorrhoids. These occur in the veins that are outside of the anus and can be felt as a painful swelling or hard lump near the anus.  Most hemorrhoids do not cause serious problems, and they can be managed with home treatments such as diet and lifestyle changes. If home treatments do not help your symptoms, procedures can be done to shrink or remove the hemorrhoids. What are the causes? This condition is caused by increased pressure in the anal area. This pressure may result from various things, including:  Constipation.  Straining to have a bowel movement.  Diarrhea.  Pregnancy.  Obesity.  Sitting for long periods of time.  Heavy lifting or other activity that causes you to strain.  Anal sex.  What are the signs or symptoms? Symptoms of this condition include:  Pain.  Anal itching or irritation.  Rectal bleeding.  Leakage of stool (feces).  Anal swelling.  One or more lumps around the anus.  How is this diagnosed? This condition can often be diagnosed through a visual exam. Other exams or tests may also be done, such as:  Examination of the rectal area with a gloved hand (digital rectal exam).  Examination of the anal canal using a small tube (anoscope).  A blood test, if you have lost a significant amount of blood.  A test to look inside the colon (sigmoidoscopy or colonoscopy).  How is this treated? This  condition can usually be treated at home. However, various procedures may be done if dietary changes, lifestyle changes, and other home treatments do not help your symptoms. These procedures can help make the hemorrhoids smaller or remove them completely. Some of these procedures involve surgery, and others do not. Common procedures include:  Rubber band ligation. Rubber bands are placed at the base of the hemorrhoids to cut off the blood supply to them.  Sclerotherapy. Medicine is injected into the hemorrhoids to shrink them.  Infrared coagulation. A type of light energy is used to get rid of the hemorrhoids.  Hemorrhoidectomy surgery. The hemorrhoids are surgically removed, and the veins that supply them are tied off.  Stapled hemorrhoidopexy surgery. A circular stapling device is used to remove the hemorrhoids and use staples to cut off the blood supply to them.  Follow these instructions at home: Eating and drinking  Eat foods that have a lot of fiber in them, such as whole grains, beans, nuts, fruits, and vegetables. Ask your health care provider about taking products that have added fiber (fiber supplements).  Drink enough fluid to keep your urine clear or pale yellow. Managing pain and swelling  Take warm sitz baths for 20 minutes, 3-4 times a day to ease pain and discomfort.  If directed, apply ice to the affected area. Using ice packs between sitz baths may be helpful. ? Put ice in a plastic bag. ? Place a towel between your skin and  the bag. ? Leave the ice on for 20 minutes, 2-3 times a day. General instructions  Take over-the-counter and prescription medicines only as told by your health care provider.  Use medicated creams or suppositories as told.  Exercise regularly.  Go to the bathroom when you have the urge to have a bowel movement. Do not wait.  Avoid straining to have bowel movements.  Keep the anal area dry and clean. Use wet toilet paper or moist towelettes  after a bowel movement.  Do not sit on the toilet for long periods of time. This increases blood pooling and pain. Contact a health care provider if:  You have increasing pain and swelling that are not controlled by treatment or medicine.  You have uncontrolled bleeding.  You have difficulty having a bowel movement, or you are unable to have a bowel movement.  You have pain or inflammation outside the area of the hemorrhoids. This information is not intended to replace advice given to you by your health care provider. Make sure you discuss any questions you have with your health care provider. Document Released: 07/13/2000 Document Revised: 12/14/2015 Document Reviewed: 03/30/2015 Elsevier Interactive Patient Education  [employer].

## 2017-07-19 NOTE — Progress Notes (Signed)
Referring Provider: Renee Rival, NP Primary Care Physician:  Renee Rival, NP  Primary GI: Dr. Gala Romney   Chief Complaint  Patient presents with  . Hemorrhoids    bleeding    HPI:   Daisy Black is a 66 y.o. female presenting today with a history of CLL, followed at [facility]. She had a flare in GERD symptoms several months ago, seen by [facility]. UGI in Feb 2018 with normal findings. PPI BID with improvement in symptoms. She has never had a colonoscopy. Presenting today with hemorrhoid issues and rectal bleeding.  No burning or itching. Walking/shopping causes bleeding and discomfort. 3 weeks' duration. No significant pain. Notes intermittent rectal bleeding after bowel movement with small amount. Used preparation H, witch hazel. Son has history of Crohn's disease. No constipation. Takes stool softener 3 every day. On Eliquis. Sometimes notes need to go but can't. Feels a pressure in rectum. She is also requesting a powder for abdomen at panniculus, which gets irritated at times. Currently without a flare.   Past Medical History:  Diagnosis Date  . Anemia   . Arthritis   . CLL (chronic lymphocytic leukemia) ([place])   . Essential hypertension   . GERD (gastroesophageal reflux disease)   . History of hiatal hernia   . Hyperlipidemia   . Paroxysmal atrial fibrillation (HCC)   . Splenomegaly     Past Surgical History:  Procedure Laterality Date  . ANKLE SURGERY Right    repair fracture  . CARDIOVERSION N/A 03/20/2016   Procedure: CARDIOVERSION;  Surgeon: Satira Sark, MD;  Location: [facility];  Service: Cardiovascular;  Laterality: N/A;  . CARDIOVERSION N/A 05/22/2016   Procedure: CARDIOVERSION;  Surgeon: Satira Sark, MD;  Location: [facility];  Service: Cardiovascular;  Laterality: N/A;  . CHOLECYSTECTOMY    . HERNIA REPAIR  2011   Incisional and umbilical utilizing mesh  . ROTATOR CUFF REPAIR Left   . TEE WITHOUT CARDIOVERSION N/A  01/17/2016   Procedure: TRANSESOPHAGEAL ECHOCARDIOGRAM (TEE);  Surgeon: Herminio Commons, MD;  Location: [facility];  Service: Cardiovascular;  Laterality: N/A;  . TEE WITHOUT CARDIOVERSION N/A 02/21/2016   Procedure: TRANSESOPHAGEAL ECHOCARDIOGRAM (TEE) WITH PROPOFOL;  Surgeon: Herminio Commons, MD;  Location: [facility];  Service: Cardiovascular;  Laterality: N/A;  . TEE WITHOUT CARDIOVERSION N/A 03/20/2016   Procedure: TRANSESOPHAGEAL ECHOCARDIOGRAM (TEE) WITH PROPOFOL;  Surgeon: Satira Sark, MD;  Location: [facility];  Service: Cardiovascular;  Laterality: N/A;  . TONSILLECTOMY      Current Outpatient Medications  Medication Sig Dispense Refill  . acetaminophen (TYLENOL) 650 MG CR tablet Take 650 mg by mouth every 8 (eight) hours as needed for pain.     Marland Kitchen apixaban (ELIQUIS) 5 MG TABS tablet Take 1 tablet (5 mg total) by mouth 2 (two) times daily. 90 tablet 3  . cetirizine (ZYRTEC) 10 MG tablet Take 10 mg by mouth daily as needed for allergies.    . Ferrous Fumarate (IRON) 18 MG TBCR Take 1 tablet by mouth daily.     . flecainide (TAMBOCOR) 50 MG tablet TAKE 1 TABLET (50 MG TOTAL) BY MOUTH 2 (TWO) TIMES DAILY. 180 tablet 3  . fluticasone (FLONASE) 50 MCG/ACT nasal spray Place 1 spray into both nostrils daily as needed for allergies or rhinitis.    . furosemide (LASIX) 20 MG tablet TAKE 1 TABLET BY MOUTH DAILY AS NEEDED FOR SWELLING 30 tablet 6  . HYDROcodone-acetaminophen (NORCO/VICODIN) 5-325 MG tablet TAKE  ONE TABLET BY MOUTH EVERY 8 HOURS AS NEEDED FOR SEVERE POSTPROCEDURAL PAIN  0  . levothyroxine (SYNTHROID, LEVOTHROID) 125 MCG tablet Take 125 mcg by mouth daily before breakfast.    . metoprolol tartrate (LOPRESSOR) 25 MG tablet Take 0.5 tablets (12.5 mg total) by mouth 2 (two) times daily. 90 tablet 3  . pantoprazole (PROTONIX) 40 MG tablet Take 1 tablet (40 mg total) by mouth 2 (two) times daily before a meal. 60 tablet 3  . promethazine (PHENERGAN) 12.5 MG tablet Take 1 tablet (12.5 mg  total) by mouth every 6 (six) hours as needed for nausea or vomiting. 30 tablet 0  . hydrocortisone (ANUSOL-HC) 2.5 % rectal cream Place 1 application rectally 2 (two) times daily. 30 g 1  . Na Sulfate-K Sulfate-Mg Sulf 17.5-3.13-1.6 GM/177ML SOLN Take 1 kit by mouth as directed. 1 Bottle 0  . nystatin (MYCOSTATIN/NYSTOP) powder Apply topically 2 (two) times daily. To affected area of abdomen 30 g 1   No current facility-administered medications for this visit.    Facility-Administered Medications Ordered in Other Visits  Medication Dose Route Frequency Provider Last Rate Last Dose  . hydrocortisone cream 1 % 1 application  1 application Topical TID PRN Satira Sark, MD        Allergies as of 07/19/2017  . (No Known Allergies)    Family History  Problem Relation Age of Onset  . Ovarian cancer Mother 90       Secondary to ovarian cancer  . Leukemia Mother   . Stroke Father 46       Brain stem infarction  . Breast cancer Paternal Aunt   . Crohn's disease Son   . Colon cancer Neg Hx     Social History   Socioeconomic History  . Marital status: Married    Spouse name: None  . Number of children: 9  . Years of education: None  . Highest education level: None  Social Needs  . Financial resource strain: None  . Food insecurity - worry: None  . Food insecurity - inability: None  . Transportation needs - medical: None  . Transportation needs - non-medical: None  Occupational History  . None  Tobacco Use  . Smoking status: Former Smoker    Packs/day: 0.25    Years: 2.00    Pack years: 0.50    Types: Cigarettes    Last attempt to quit: 08/03/1975    Years since quitting: 41.9  . Smokeless tobacco: Never Used  Substance and Sexual Activity  . Alcohol use: No    Alcohol/week: 0.0 oz  . Drug use: No  . Sexual activity: Yes    Birth control/protection: Post-menopausal  Other Topics Concern  . None  Social History Narrative  . None    Review of Systems: Gen:  Denies fever, chills, anorexia. Denies fatigue, weakness, weight loss.  CV: Denies chest pain, palpitations, syncope, peripheral edema, and claudication. Resp: Denies dyspnea at rest, cough, wheezing, coughing up blood, and pleurisy. GI: see HPI  Derm: Denies rash, itching, dry skin Psych: Denies depression, anxiety, memory loss, confusion. No homicidal or suicidal ideation.  Heme: see HPI   Physical Exam: BP 125/75   Pulse 80   Temp 97.9 F (36.6 C) (Oral)   Ht 5' 7"  (1.702 m)   Wt 259 lb 9.6 oz (117.8 kg)   BMI 40.66 kg/m  General:   Alert and oriented. No distress noted. Pleasant and cooperative.  Head:  Normocephalic and atraumatic. Eyes:  Conjuctiva  clear without scleral icterus. Mouth:  Oral mucosa pink and moist.  Cardiac: S1 S2 present without murmurs Lungs: clear bilaterally  Abdomen:  +BS, soft, non-tender and non-distended. No rebound or guarding. No HSM or masses noted. Rectal: No obvious fissure. Small external hemorrhoids, non-thrombosed. No obvious abnormalities on internal exam.  Msk:  Symmetrical without gross deformities. Normal posture. Extremities:  Without edema. Neurologic:  Alert and  oriented x4 Psych:  Alert and cooperative. Normal mood and affect.

## 2017-07-21 ENCOUNTER — Encounter: Payer: Self-pay | Admitting: Gastroenterology

## 2017-07-21 NOTE — Assessment & Plan Note (Addendum)
66 year old female with likely hemorrhoid flare as culprit for rectal bleeding. No prior colonoscopy. Agreeable to colonoscopy now for diagnostic purposes. Will treat with Anusol BID, avoid constipation, supportive measures.  Proceed with TCS with Dr. Gala Romney in near future: the risks, benefits, and alternatives have been discussed with the patient in detail. The patient states understanding and desires to proceed. Propofol due to polypharmacy Will hold Eliquis X 48 hours prior. Discussed with cardiology.

## 2017-07-22 ENCOUNTER — Telehealth: Payer: Self-pay

## 2017-07-22 NOTE — Telephone Encounter (Signed)
Tried to call pt to inform of pre-op appt 08/29/17 at 11:00am, no answer, LMOVM. Letter mailed.

## 2017-07-22 NOTE — Progress Notes (Signed)
CC'D TO PCP °

## 2017-07-25 ENCOUNTER — Telehealth: Payer: Self-pay

## 2017-07-25 NOTE — Telephone Encounter (Signed)
Dr. Domenic Polite, Pt is scheduled for a colonoscopy with Dr. Gala Romney on 09/05/17, Is it ok to hol Eliquis x 48 hrs prior to procedure?

## 2017-07-26 NOTE — Telephone Encounter (Signed)
Great!

## 2017-07-26 NOTE — Telephone Encounter (Signed)
Yes, reasonable to hold Eliquis 48 hours prior to colonoscopy, that would allow for potential biopsy if necessary.

## 2017-07-26 NOTE — Telephone Encounter (Signed)
Routing message 

## 2017-08-05 ENCOUNTER — Other Ambulatory Visit: Payer: Self-pay | Admitting: Cardiology

## 2017-08-14 ENCOUNTER — Telehealth: Payer: Self-pay | Admitting: Internal Medicine

## 2017-08-14 NOTE — Telephone Encounter (Signed)
Called Endo and spoke with Maudie Mercury. She was already aware to cancel as patient called them as well. FYI to AB.

## 2017-08-14 NOTE — Telephone Encounter (Signed)
Pt hurt her foot and needs to cancel her colonoscopy on 2-7 with [facility]. She will call back to reschedule later

## 2017-08-21 NOTE — Progress Notes (Signed)
Cardiology Office Note  Date: 08/23/2017   ID: Daisy Black, Daisy Black 1950-12-06, MRN [id number]  PCP: Renee Rival, NP  Primary Cardiologist: Rozann Lesches, MD   Chief Complaint  Patient presents with  . PAF    History of Present Illness: Daisy Black is a 67 y.o. female last seen in May 2018. She is here today with her daughter for a routine follow-up visit. She has done well overall. Does have occasional palpitations at nighttime and brief, fleeting episodes of sharp chest discomfort. These symptoms have not been progressive. She has no exertional chest pain. She has had no syncope.  I personally reviewed her ECG today which shows sinus bradycardia, QRS duration 88 ms, normal QT.  I reviewed her current medications. She continues on Eliquis, flecainide, Lopressor, and Lasix as needed. She does not report any bleeding problems.  Past Medical History:  Diagnosis Date  . Anemia   . Arthritis   . CLL (chronic lymphocytic leukemia) ([place])   . Essential hypertension   . GERD (gastroesophageal reflux disease)   . History of hiatal hernia   . Hyperlipidemia   . Paroxysmal atrial fibrillation (HCC)   . Splenomegaly     Past Surgical History:  Procedure Laterality Date  . ANKLE SURGERY Right    repair fracture  . CARDIOVERSION N/A 03/20/2016   Procedure: CARDIOVERSION;  Surgeon: Satira Sark, MD;  Location: [facility];  Service: Cardiovascular;  Laterality: N/A;  . CARDIOVERSION N/A 05/22/2016   Procedure: CARDIOVERSION;  Surgeon: Satira Sark, MD;  Location: [facility];  Service: Cardiovascular;  Laterality: N/A;  . CHOLECYSTECTOMY    . HERNIA REPAIR  2011   Incisional and umbilical utilizing mesh  . ROTATOR CUFF REPAIR Left   . TEE WITHOUT CARDIOVERSION N/A 01/17/2016   Procedure: TRANSESOPHAGEAL ECHOCARDIOGRAM (TEE);  Surgeon: Herminio Commons, MD;  Location: [facility];  Service: Cardiovascular;  Laterality: N/A;  . TEE WITHOUT CARDIOVERSION N/A 02/21/2016   Procedure: TRANSESOPHAGEAL ECHOCARDIOGRAM (TEE) WITH PROPOFOL;  Surgeon: Herminio Commons, MD;  Location: [facility];  Service: Cardiovascular;  Laterality: N/A;  . TEE WITHOUT CARDIOVERSION N/A 03/20/2016   Procedure: TRANSESOPHAGEAL ECHOCARDIOGRAM (TEE) WITH PROPOFOL;  Surgeon: Satira Sark, MD;  Location: [facility];  Service: Cardiovascular;  Laterality: N/A;  . TONSILLECTOMY      Current Outpatient Medications  Medication Sig Dispense Refill  . acetaminophen (TYLENOL) 650 MG CR tablet Take 650 mg by mouth every 8 (eight) hours as needed for pain.     Marland Kitchen apixaban (ELIQUIS) 5 MG TABS tablet Take 1 tablet (5 mg total) by mouth 2 (two) times daily. 90 tablet 3  . cetirizine (ZYRTEC) 10 MG tablet Take 10 mg by mouth daily as needed for allergies.    . Ferrous Fumarate (IRON) 18 MG TBCR Take 1 tablet by mouth daily.     . flecainide (TAMBOCOR) 50 MG tablet TAKE 1 TABLET (50 MG TOTAL) BY MOUTH 2 (TWO) TIMES DAILY. 180 tablet 3  . fluticasone (FLONASE) 50 MCG/ACT nasal spray Place 1 spray into both nostrils daily as needed for allergies or rhinitis.    . furosemide (LASIX) 20 MG tablet TAKE 1 TABLET BY MOUTH DAILY AS NEEDED FOR SWELLING 30 tablet 6  . levothyroxine (SYNTHROID, LEVOTHROID) 125 MCG tablet Take 125 mcg by mouth daily before breakfast.    . metoprolol tartrate (LOPRESSOR) 25 MG tablet TAKE 1 TABLET BY MOUTH TWO TIMES A DAY 180 tablet 3  . nystatin (MYCOSTATIN/NYSTOP) powder Apply  topically 2 (two) times daily. To affected area of abdomen 30 g 1  . pantoprazole (PROTONIX) 40 MG tablet Take 1 tablet (40 mg total) by mouth 2 (two) times daily before a meal. 60 tablet 3  . promethazine (PHENERGAN) 12.5 MG tablet Take 1 tablet (12.5 mg total) by mouth every 6 (six) hours as needed for nausea or vomiting. 30 tablet 0   No current facility-administered medications for this visit.    Facility-Administered Medications Ordered in Other Visits  Medication Dose Route Frequency Provider Last Rate  Last Dose  . hydrocortisone cream 1 % 1 application  1 application Topical TID PRN Satira Sark, MD       Allergies:  Patient has no known allergies.   Social History: The patient  reports that she quit smoking about 42 years ago. Her smoking use included cigarettes. She has a 0.50 pack-year smoking history. she has never used smokeless tobacco. She reports that she does not drink alcohol or use drugs.   ROS:  Please see the history of present illness. Otherwise, complete review of systems is positive for arthritic pains.  All other systems are reviewed and negative.   Physical Exam: VS:  BP 124/72   Pulse (!) 59   Ht 5' 7.5" (1.715 m)   Wt 261 lb (118.4 kg)   SpO2 98%   BMI 40.28 kg/m , BMI Body mass index is 40.28 kg/m.  Wt Readings from Last 3 Encounters:  08/23/17 261 lb (118.4 kg)  07/19/17 259 lb 9.6 oz (117.8 kg)  05/15/17 262 lb 8 oz (119.1 kg)    General: Patient appears comfortable at rest. HEENT: Conjunctiva and lids normal, oropharynx clear. Neck: Supple, no elevated JVP or carotid bruits, no thyromegaly. Lungs: Clear to auscultation, nonlabored breathing at rest. Cardiac: Regular rate and rhythm, no S3 or significant systolic murmur, no pericardial rub. Abdomen: Soft, nontender, bowel sounds present. Extremities: Mild ankle edema, distal pulses 2+. Skin: Warm and dry. Musculoskeletal: No kyphosis. Neuropsychiatric: Alert and oriented x3, affect grossly appropriate.  ECG: I personally reviewed the tracing from 06/01/2016 which showed sinus bradycardia with PACs.  Recent Labwork: 05/15/2017: Hemoglobin 14.0; Platelets 123 05/23/2017: Creatinine, Ser 0.80   Other Studies Reviewed Today:  TEE 03/20/2016: Study Conclusions  - Left ventricle: Systolic function was normal. The estimated ejection fraction was in the range of 60% to 65%. Wall motion was normal; there were no regional wall motion abnormalities. No evidence of thrombus. - Descending  aorta: The descending aorta had minor luminal irregularities. - Mitral valve: There was mild regurgitation. - Left atrium: The atrium was mildly dilated. No evidence of thrombus in the atrial cavity or appendage. Prominent trabeculation visualized at tip of appendage. Emptying velocity was moderately reduced. - Right atrium: No evidence of thrombus in the atrial cavity or appendage. - Tricuspid valve: There was mild regurgitation. - Pericardium, extracardiac: There was no pericardial effusion.  Impressions:  - LVEF 60-65% without wall motion abnormalities. No left atrial appendage thrombus visualized. There is prominent trabeculation noted at the appendage tip. Emptying velocity was moderately reduced. No right atrial appendage thrombus noted. Mild tricuspid regurgitation.  Assessment and Plan:  1. Paroxysmal atrial fibrillation. She has done well in terms of symptomatic control overall and is in sinus rhythm today by follow-up ECG with normal intervals. Plan to continue flecainide, Lopressor, and Eliquis. I reviewed her interval lab work from October 2018.  2. Essential hypertension, blood pressure is adequately controlled today.  3. History of bradycardia. She  is tolerating the current dose of Lopressor which we will continue.  4. Intermittent leg edema, this is been adequately controlled with as needed use of Lasix.  Current medicines were reviewed with the patient today.   Orders Placed This Encounter  Procedures  . EKG 12-Lead    Disposition: Follow-up in 6 months.  Signed, Satira Sark, MD, [facility] 08/23/2017 2:54 PM    [facility] [place], [place], [place] [address] Phone: [phone]; Fax: [phone]

## 2017-08-23 ENCOUNTER — Ambulatory Visit (INDEPENDENT_AMBULATORY_CARE_PROVIDER_SITE_OTHER): Payer: Medicare Other | Admitting: Cardiology

## 2017-08-23 ENCOUNTER — Encounter: Payer: Self-pay | Admitting: Cardiology

## 2017-08-23 VITALS — BP 124/72 | HR 59 | Ht 67.5 in | Wt 261.0 lb

## 2017-08-23 DIAGNOSIS — I10 Essential (primary) hypertension: Secondary | ICD-10-CM | POA: Diagnosis not present

## 2017-08-23 DIAGNOSIS — R600 Localized edema: Secondary | ICD-10-CM | POA: Diagnosis not present

## 2017-08-23 DIAGNOSIS — R001 Bradycardia, unspecified: Secondary | ICD-10-CM

## 2017-08-23 DIAGNOSIS — I480 Paroxysmal atrial fibrillation: Secondary | ICD-10-CM | POA: Diagnosis not present

## 2017-08-23 NOTE — Patient Instructions (Signed)

## 2017-08-29 ENCOUNTER — Other Ambulatory Visit (HOSPITAL_COMMUNITY): Payer: Medicare Other

## 2017-09-05 ENCOUNTER — Other Ambulatory Visit: Payer: Self-pay | Admitting: Gastroenterology

## 2017-09-05 ENCOUNTER — Ambulatory Visit (HOSPITAL_COMMUNITY): Admit: 2017-09-05 | Payer: Medicare Other | Admitting: Internal Medicine

## 2017-09-05 ENCOUNTER — Encounter (HOSPITAL_COMMUNITY): Payer: Self-pay

## 2017-09-05 SURGERY — COLONOSCOPY WITH PROPOFOL
Anesthesia: Monitor Anesthesia Care

## 2017-09-16 ENCOUNTER — Ambulatory Visit: Payer: Medicare Other | Admitting: Oncology

## 2017-09-17 ENCOUNTER — Encounter: Payer: Self-pay | Admitting: Obstetrics & Gynecology

## 2017-11-06 ENCOUNTER — Telehealth: Payer: Self-pay | Admitting: *Deleted

## 2017-11-06 NOTE — Telephone Encounter (Signed)
Daughter states they were told that she needs full scans every 6 months and she is only scheduled for Neck scan. Asking if that needs to be changed appointment is 4/17. Please advise

## 2017-11-06 NOTE — Telephone Encounter (Signed)
In reviewing her chart, scans are ordered for neck/chest/abdomen/pelvis. I have confirmed with scheduling that all 3 scans are scheduled, they are all linked within the order for ct neck.

## 2017-11-07 ENCOUNTER — Other Ambulatory Visit: Payer: Self-pay

## 2017-11-07 MED ORDER — METOPROLOL TARTRATE 25 MG PO TABS: 25.0000 mg | ORAL_TABLET | Freq: Two times a day (BID) | ORAL | 3 refills | Status: DC

## 2017-11-07 NOTE — Telephone Encounter (Signed)
Refilled metoprolol per fax request 25 mg BID

## 2017-11-10 NOTE — Progress Notes (Deleted)
[facility]  [phone] [phone]  ID: Daisy Black OB: [birth year]  MR#: [id number]  RWE#:[id number]  Patient Care Team: Renee Rival, NP as PCP - General (Nurse Practitioner) Aviva Signs, MD (General Surgery) Rico Junker, RN as Registered Nurse Theodore Demark, RN as Registered Nurse Satira Sark, MD as Consulting Physician (Cardiology) Daneil Dolin, MD as Consulting Physician (Gastroenterology)  CHIEF COMPLAINT: CLL  INTERVAL HISTORY: Patient last evaluated in clinic in January 2018. She has chronic weakness and fatigue, but otherwise feels well. She does not complain of shortness of breath or chest pain. She no longer complains of early satiety, reflux, or nausea. She continues to be anxious. She has no fevers, chills, or night sweats. She denies any constipation or diarrhea. She has no melanoma or hematochezia. Patient offers no further specific complaints today.  REVIEW OF SYSTEMS:   Review of Systems  Constitutional: Positive for malaise/fatigue. Negative for diaphoresis, fever and weight loss.  Respiratory: Negative.  Negative for cough and shortness of breath.   Cardiovascular: Negative.  Negative for chest pain and leg swelling.  Gastrointestinal: Negative.  Negative for abdominal pain, blood in stool, constipation, diarrhea, heartburn, melena, nausea and vomiting.  Genitourinary: Negative.   Musculoskeletal: Negative.   Neurological: Positive for weakness.  Psychiatric/Behavioral: The patient is nervous/anxious.     As per HPI. Otherwise, a complete review of systems is negative.  PAST MEDICAL HISTORY: Past Medical History:  Diagnosis Date  . Anemia   . Arthritis   . CLL (chronic lymphocytic leukemia) ([place])   . Essential hypertension   . GERD (gastroesophageal reflux disease)   . History of hiatal hernia   . Hyperlipidemia   . Paroxysmal atrial fibrillation ([facility])   . Splenomegaly     PAST  SURGICAL HISTORY: Past Surgical History:  Procedure Laterality Date  . ANKLE SURGERY Right    repair fracture  . CARDIOVERSION N/A 03/20/2016   Procedure: CARDIOVERSION;  Surgeon: Satira Sark, MD;  Location: [facility];  Service: Cardiovascular;  Laterality: N/A;  . CARDIOVERSION N/A 05/22/2016   Procedure: CARDIOVERSION;  Surgeon: Satira Sark, MD;  Location: [facility];  Service: Cardiovascular;  Laterality: N/A;  . CHOLECYSTECTOMY    . HERNIA REPAIR  2011   Incisional and umbilical utilizing mesh  . ROTATOR CUFF REPAIR Left   . TEE WITHOUT CARDIOVERSION N/A 01/17/2016   Procedure: TRANSESOPHAGEAL ECHOCARDIOGRAM (TEE);  Surgeon: Herminio Commons, MD;  Location: [facility];  Service: Cardiovascular;  Laterality: N/A;  . TEE WITHOUT CARDIOVERSION N/A 02/21/2016   Procedure: TRANSESOPHAGEAL ECHOCARDIOGRAM (TEE) WITH PROPOFOL;  Surgeon: Herminio Commons, MD;  Location: [facility];  Service: Cardiovascular;  Laterality: N/A;  . TEE WITHOUT CARDIOVERSION N/A 03/20/2016   Procedure: TRANSESOPHAGEAL ECHOCARDIOGRAM (TEE) WITH PROPOFOL;  Surgeon: Satira Sark, MD;  Location: [facility];  Service: Cardiovascular;  Laterality: N/A;  . TONSILLECTOMY      FAMILY HISTORY Family History  Problem Relation Age of Onset  . Ovarian cancer Mother 7       Secondary to ovarian cancer  . Leukemia Mother   . Stroke Father 71       Brain stem infarction  . Breast cancer Paternal Aunt   . Crohn's disease Son   . Colon cancer Neg Hx        ADVANCED DIRECTIVES:    HEALTH MAINTENANCE: Social History   Tobacco Use  . Smoking status: Former Smoker    Packs/day: 0.25  Years: 2.00    Pack years: 0.50    Types: Cigarettes    Last attempt to quit: 08/03/1975    Years since quitting: 42.3  . Smokeless tobacco: Never Used  Substance Use Topics  . Alcohol use: No    Alcohol/week: 0.0 oz  . Drug use: No     Colonoscopy:  PAP:  Bone density:  Lipid panel:  No Known Allergies  Current  Outpatient Medications  Medication Sig Dispense Refill  . acetaminophen (TYLENOL) 650 MG CR tablet Take 650 mg by mouth every 8 (eight) hours as needed for pain.     Marland Kitchen apixaban (ELIQUIS) 5 MG TABS tablet Take 1 tablet (5 mg total) by mouth 2 (two) times daily. 90 tablet 3  . cetirizine (ZYRTEC) 10 MG tablet Take 10 mg by mouth daily as needed for allergies.    . Ferrous Fumarate (IRON) 18 MG TBCR Take 1 tablet by mouth daily.     . flecainide (TAMBOCOR) 50 MG tablet TAKE 1 TABLET (50 MG TOTAL) BY MOUTH 2 (TWO) TIMES DAILY. 180 tablet 3  . fluticasone (FLONASE) 50 MCG/ACT nasal spray Place 1 spray into both nostrils daily as needed for allergies or rhinitis.    . furosemide (LASIX) 20 MG tablet TAKE 1 TABLET BY MOUTH DAILY AS NEEDED FOR SWELLING 30 tablet 6  . levothyroxine (SYNTHROID, LEVOTHROID) 125 MCG tablet Take 125 mcg by mouth daily before breakfast.    . metoprolol tartrate (LOPRESSOR) 25 MG tablet Take 1 tablet (25 mg total) by mouth 2 (two) times daily. 180 tablet 3  . nystatin (MYCOSTATIN/NYSTOP) powder Apply topically 2 (two) times daily. To affected area of abdomen 30 g 1  . pantoprazole (PROTONIX) 40 MG tablet TAKE ONE TABLET BY MOUTH TWO TIMES A DAY WITH FOOD 60 tablet 3  . promethazine (PHENERGAN) 12.5 MG tablet Take 1 tablet (12.5 mg total) by mouth every 6 (six) hours as needed for nausea or vomiting. 30 tablet 0   No current facility-administered medications for this visit.    Facility-Administered Medications Ordered in Other Visits  Medication Dose Route Frequency Provider Last Rate Last Dose  . hydrocortisone cream 1 % 1 application  1 application Topical TID PRN Satira Sark, MD        OBJECTIVE: There were no vitals filed for this visit.   There is no height or weight on file to calculate BMI.    ECOG FS:0 - Asymptomatic  General: Well-developed, well-nourished, no acute distress. Eyes: Pink conjunctiva, anicteric sclera. HEENT: Minimally palpable posterior  right cervical node. Lungs: Clear to auscultation bilaterally. Heart: Regular rate and rhythm. No rubs, murmurs, or gallops. Abdomen: Soft, nontender, nondistended. Splenomegaly noted, normoactive bowel sounds. Musculoskeletal: No edema, cyanosis, or clubbing. Neuro: Alert, answering all questions appropriately. Cranial nerves grossly intact. Skin: No rashes or petechiae noted. Psych: Normal affect.  LAB RESULTS:  Lab Results  Component Value Date   NA 140 07/17/2016   K 3.5 07/17/2016   CL 107 07/17/2016   CO2 27 07/17/2016   GLUCOSE 96 07/17/2016   BUN 16 07/17/2016   CREATININE 0.80 05/23/2017   CALCIUM 9.9 07/17/2016   PROT 6.4 (L) 07/17/2016   ALBUMIN 3.9 07/17/2016   AST 17 07/17/2016   ALT 13 (L) 07/17/2016   ALKPHOS 80 07/17/2016   BILITOT 0.9 07/17/2016   GFRNONAA >60 07/17/2016   GFRAA >60 07/17/2016    Lab Results  Component Value Date   WBC 24.6 (H) 05/15/2017   NEUTROABS 3.5  05/15/2017   HGB 14.0 05/15/2017   HCT 42.9 05/15/2017   MCV 94.2 05/15/2017   PLT 123 (L) 05/15/2017     STUDIES: No results found.  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  PLAN:    1. CLL: PET scan results from July 17, 2016 reviewed independently and reported as above revealed significant improvement of disease burden including decreased size of spleen. Patient's last infusion of Rituxan was on May 17, 2016. Patient has agreed to pursue repeat imaging with CT scan in the next 1-2 weeks. Her white count has also trended up and is now 24.6. Patient wishes to be called with her CT results and will follow-up if any intervention is needed. If no treatment is needed, patient will return to clinic in 6 months for repeat laboratory work and further evaluation.   2. Early satiety, nausea, vomiting: Resolved. Splenomegaly significantly improved with treatment. 3. Atrial fibrillation: Continue monitoring and treatment per cardiology. 4. Iron  deficiency anemia: Patient's hemoglobin and iron stores are within normal limits. She last received IV Feraheme in May 2017.  5. PET positive thyroid lesions: 1.2 cm right lobe nodule also seen on thyroid ultrasound April 06, 2016. Recommendation at that time was annual follow-up.  6. Shortness breath: Patient does not complain of this today.  Patient expressed understanding and was in agreement with this plan. She also understands that She can call clinic at any time with any questions, concerns, or complaints.     Lloyd Huger, MD   11/10/2017 11:40 PM

## 2017-11-11 ENCOUNTER — Ambulatory Visit: Admission: RE | Admit: 2017-11-11 | Payer: Medicare Other | Source: Ambulatory Visit

## 2017-11-11 ENCOUNTER — Ambulatory Visit
Admission: RE | Admit: 2017-11-11 | Discharge: 2017-11-11 | Disposition: A | Discharge status: Home / Self Care | Payer: Medicare Other | Source: Ambulatory Visit | Attending: Oncology | Admitting: Oncology

## 2017-11-11 DIAGNOSIS — I700 Atherosclerosis of aorta: Secondary | ICD-10-CM | POA: Diagnosis not present

## 2017-11-11 DIAGNOSIS — C9190 Lymphoid leukemia, unspecified not having achieved remission: Secondary | ICD-10-CM | POA: Diagnosis present

## 2017-11-11 DIAGNOSIS — C9110 Chronic lymphocytic leukemia of B-cell type not having achieved remission: Secondary | ICD-10-CM | POA: Diagnosis not present

## 2017-11-11 DIAGNOSIS — R161 Splenomegaly, not elsewhere classified: Secondary | ICD-10-CM | POA: Diagnosis not present

## 2017-11-11 DIAGNOSIS — E041 Nontoxic single thyroid nodule: Secondary | ICD-10-CM | POA: Diagnosis not present

## 2017-11-11 DIAGNOSIS — R590 Localized enlarged lymph nodes: Secondary | ICD-10-CM | POA: Diagnosis not present

## 2017-11-11 LAB — POCT I-STAT CREATININE: Creatinine, Ser: 0.8 mg/dL (ref 0.44–1.00)

## 2017-11-11 MED ORDER — IOPAMIDOL (ISOVUE-300) INJECTION 61%
125.0000 mL | Freq: Once | INTRAVENOUS | Status: AC | PRN
  Administered 2017-11-11: 125 mL via INTRAVENOUS

## 2017-11-12 ENCOUNTER — Other Ambulatory Visit: Payer: Self-pay | Admitting: *Deleted

## 2017-11-12 DIAGNOSIS — C9110 Chronic lymphocytic leukemia of B-cell type not having achieved remission: Secondary | ICD-10-CM

## 2017-11-12 NOTE — Progress Notes (Signed)
cbc

## 2017-11-13 ENCOUNTER — Inpatient Hospital Stay: Payer: Medicare Other | Admitting: Oncology

## 2017-11-13 ENCOUNTER — Inpatient Hospital Stay: Payer: Medicare Other

## 2017-11-13 ENCOUNTER — Telehealth: Payer: Self-pay | Admitting: *Deleted

## 2017-11-13 NOTE — Telephone Encounter (Signed)
Patient daughter called to report that patient is sick and is not coming in today. She can be reached at [phone].

## 2017-11-13 NOTE — Telephone Encounter (Signed)
Daughter called and stated that she was expecting medicine to be sent to pharmacy for her mother, but did not say what medicine and I returned her call and got voice mail. I left message to return my call with the medicine name she needs

## 2017-11-14 ENCOUNTER — Telehealth: Payer: Self-pay | Admitting: *Deleted

## 2017-11-14 MED ORDER — PROMETHAZINE HCL 25 MG RE SUPP: 25.0000 mg | Freq: Four times a day (QID) | RECTAL | 1 refills | Status: DC | PRN

## 2017-11-14 NOTE — Telephone Encounter (Signed)
Patient is staying nauseated and Phenergan tabs are not helping, Patient requesting a prescription for suppositories. She got sick after drinking contrast day befrore yesterday. Please advise

## 2017-11-14 NOTE — Telephone Encounter (Signed)
She can have the suppositories.

## 2017-11-14 NOTE — Telephone Encounter (Signed)
Raquel Sarna informed of prescription sent to pharmacy and to call if does not improve

## 2017-11-20 NOTE — Progress Notes (Signed)
[place]  [phone] [phone]  ID: Daisy Black OB: 03-26-1951  MR#: [id number]  PNT#:[id number]  Patient Care Team: Renee Rival, NP as PCP - General (Nurse Practitioner) Aviva Signs, MD (General Surgery) Rico Junker, RN as Registered Nurse Theodore Demark, RN as Registered Nurse Satira Sark, MD as Consulting Physician (Cardiology) Gala Romney Cristopher Estimable, MD as Consulting Physician (Gastroenterology)  CHIEF COMPLAINT: CLL  INTERVAL HISTORY: Patient returns to clinic today for further evaluation, laboratory work, and discussion of her imaging results.  She continues to have chronic weakness and fatigue, but otherwise feels well.  She has no neurologic complaints.  She denies any shortness of breath or chest pain.  She denies any early satiety, reflux, or nausea. She has no fevers, chills, or night sweats.  She has a good appetite and denies weight loss.  She denies any nausea, vomiting, constipation, or diarrhea.  Patient offers no further specific complaints today.    REVIEW OF SYSTEMS:   Review of Systems  Constitutional: Positive for malaise/fatigue. Negative for diaphoresis, fever and weight loss.  Respiratory: Negative.  Negative for cough and shortness of breath.   Cardiovascular: Negative.  Negative for chest pain and leg swelling.  Gastrointestinal: Negative.  Negative for abdominal pain, blood in stool, constipation, diarrhea, heartburn, melena, nausea and vomiting.  Genitourinary: Negative.   Musculoskeletal: Negative.   Skin: Negative.  Negative for rash.  Neurological: Positive for weakness. Negative for sensory change and focal weakness.  Psychiatric/Behavioral: The patient is nervous/anxious.     As per HPI. Otherwise, a complete review of systems is negative.  PAST MEDICAL HISTORY: Past Medical History:  Diagnosis Date  . Anemia   . Arthritis   . CLL (chronic lymphocytic leukemia) ([place])   . Essential  hypertension   . GERD (gastroesophageal reflux disease)   . History of hiatal hernia   . Hyperlipidemia   . Paroxysmal atrial fibrillation ([facility])   . Splenomegaly     PAST SURGICAL HISTORY: Past Surgical History:  Procedure Laterality Date  . ANKLE SURGERY Right    repair fracture  . CARDIOVERSION N/A 03/20/2016   Procedure: CARDIOVERSION;  Surgeon: Satira Sark, MD;  Location: [facility];  Service: Cardiovascular;  Laterality: N/A;  . CARDIOVERSION N/A 05/22/2016   Procedure: CARDIOVERSION;  Surgeon: Satira Sark, MD;  Location: [facility];  Service: Cardiovascular;  Laterality: N/A;  . CHOLECYSTECTOMY    . HERNIA REPAIR  2011   Incisional and umbilical utilizing mesh  . ROTATOR CUFF REPAIR Left   . TEE WITHOUT CARDIOVERSION N/A 01/17/2016   Procedure: TRANSESOPHAGEAL ECHOCARDIOGRAM (TEE);  Surgeon: Herminio Commons, MD;  Location: [facility];  Service: Cardiovascular;  Laterality: N/A;  . TEE WITHOUT CARDIOVERSION N/A 02/21/2016   Procedure: TRANSESOPHAGEAL ECHOCARDIOGRAM (TEE) WITH PROPOFOL;  Surgeon: Herminio Commons, MD;  Location: [facility];  Service: Cardiovascular;  Laterality: N/A;  . TEE WITHOUT CARDIOVERSION N/A 03/20/2016   Procedure: TRANSESOPHAGEAL ECHOCARDIOGRAM (TEE) WITH PROPOFOL;  Surgeon: Satira Sark, MD;  Location: [facility];  Service: Cardiovascular;  Laterality: N/A;  . TONSILLECTOMY      FAMILY HISTORY Family History  Problem Relation Age of Onset  . Ovarian cancer Mother 89       Secondary to ovarian cancer  . Leukemia Mother   . Stroke Father 33       Brain stem infarction  . Breast cancer Paternal Aunt   . Crohn's disease Son   . Colon cancer Neg Hx  ADVANCED DIRECTIVES:    HEALTH MAINTENANCE: Social History   Tobacco Use  . Smoking status: Former Smoker    Packs/day: 0.25    Years: 2.00    Pack years: 0.50    Types: Cigarettes    Last attempt to quit: 08/03/1975    Years since quitting: 42.3  . Smokeless tobacco: Never Used    Substance Use Topics  . Alcohol use: No    Alcohol/week: 0.0 oz  . Drug use: No     Colonoscopy:  PAP:  Bone density:  Lipid panel:  No Known Allergies  Current Outpatient Medications  Medication Sig Dispense Refill  . apixaban (ELIQUIS) 5 MG TABS tablet Take 1 tablet (5 mg total) by mouth 2 (two) times daily. 90 tablet 3  . cetirizine (ZYRTEC) 10 MG tablet Take 10 mg by mouth daily as needed for allergies.    . Ferrous Fumarate (IRON) 18 MG TBCR Take 1 tablet by mouth daily.     . flecainide (TAMBOCOR) 50 MG tablet TAKE 1 TABLET (50 MG TOTAL) BY MOUTH 2 (TWO) TIMES DAILY. 180 tablet 3  . fluticasone (FLONASE) 50 MCG/ACT nasal spray Place 1 spray into both nostrils daily as needed for allergies or rhinitis.    Marland Kitchen levothyroxine (SYNTHROID, LEVOTHROID) 125 MCG tablet Take 125 mcg by mouth daily before breakfast.    . metoprolol tartrate (LOPRESSOR) 25 MG tablet Take 1 tablet (25 mg total) by mouth 2 (two) times daily. 180 tablet 3  . nystatin (MYCOSTATIN/NYSTOP) powder Apply topically 2 (two) times daily. To affected area of abdomen 30 g 1  . pantoprazole (PROTONIX) 40 MG tablet TAKE ONE TABLET BY MOUTH TWO TIMES A DAY WITH FOOD 60 tablet 3  . acetaminophen (TYLENOL) 650 MG CR tablet Take 650 mg by mouth every 8 (eight) hours as needed for pain.     . furosemide (LASIX) 20 MG tablet TAKE 1 TABLET BY MOUTH DAILY AS NEEDED FOR SWELLING (Patient not taking: Reported on 11/21/2017) 30 tablet 6  . promethazine (PHENERGAN) 12.5 MG tablet Take 1 tablet (12.5 mg total) by mouth every 6 (six) hours as needed for nausea or vomiting. (Patient not taking: Reported on 11/21/2017) 30 tablet 0  . promethazine (PHENERGAN) 25 MG suppository Place 1 suppository (25 mg total) rectally every 6 (six) hours as needed for nausea or vomiting. (Patient not taking: Reported on 11/21/2017) 12 each 1   No current facility-administered medications for this visit.    Facility-Administered Medications Ordered in Other  Visits  Medication Dose Route Frequency Provider Last Rate Last Dose  . hydrocortisone cream 1 % 1 application  1 application Topical TID PRN Satira Sark, MD        OBJECTIVE: Vitals:   11/21/17 1044  BP: 116/65  Pulse: 73  Resp: 18  Temp: (!) 95.7 F (35.4 C)     Body mass index is 39.23 kg/m.    ECOG FS:0 - Asymptomatic  General: Well-developed, well-nourished, no acute distress. Eyes: Pink conjunctiva, anicteric sclera. HEENT: Normocephalic, moist mucous membranes, clear oropharnyx. Lungs: Clear to auscultation bilaterally. Heart: Regular rate and rhythm. No rubs, murmurs, or gallops. Abdomen: Soft, nontender, nondistended.  Mild splenomegaly, normoactive bowel sounds. Musculoskeletal: No edema, cyanosis, or clubbing. Neuro: Alert, answering all questions appropriately. Cranial nerves grossly intact. Skin: No rashes or petechiae noted. Psych: Normal affect. Lymphatics: No cervical, calvicular, axillary or inguinal LAD.  LAB RESULTS:  Lab Results  Component Value Date   NA 141 11/21/2017   K 4.2  11/21/2017   CL 108 11/21/2017   CO2 26 11/21/2017   GLUCOSE 90 11/21/2017   BUN 18 11/21/2017   CREATININE 0.81 11/21/2017   CALCIUM 10.2 11/21/2017   PROT 6.5 11/21/2017   ALBUMIN 4.2 11/21/2017   AST 20 11/21/2017   ALT 23 11/21/2017   ALKPHOS 92 11/21/2017   BILITOT 1.0 11/21/2017   GFRNONAA >60 11/21/2017   GFRAA >60 11/21/2017    Lab Results  Component Value Date   WBC 30.2 (H) 11/21/2017   NEUTROABS 3.5 11/21/2017   HGB 14.2 11/21/2017   HCT 42.9 11/21/2017   MCV 95.0 11/21/2017   PLT 128 (L) 11/21/2017     STUDIES: Ct Soft Tissue Neck W Contrast  Result Date: 11/11/2017 CLINICAL DATA:  67 year old female restaging CLL. Symptomatically improved since 2018. EXAM: CT NECK WITH CONTRAST TECHNIQUE: Multidetector CT imaging of the neck was performed using the standard protocol following the bolus administration of intravenous contrast. CONTRAST:   134mL ISOVUE-300 IOPAMIDOL (ISOVUE-300) INJECTION 61% in conjunction with contrast enhanced imaging of the chest, abdomen, and pelvis reported separately. COMPARISON:  Neck CT 05/23/2017, PET-CT 07/17/2016, and earlier. FINDINGS: Pharynx and larynx: Laryngeal and pharyngeal soft tissue contours are stable. A benign chronic retention cyst is suspected in the right vallecula (series 2, image 47). Negative parapharyngeal and retropharyngeal spaces. Salivary glands: The sublingual space, submandibular glands, and parotid glands are within normal limits. Parotid space lymph nodes are described below. Thyroid: The left isthmus region thyroid nodule appears stable since October 2018 measuring 16-17 millimeters diameter (series 2, image 78). The remaining thyroid parenchyma is stable and negative. Lymph nodes: Moderate generalized lymphadenopathy throughout the neck in the form of increased number of rounded lymph nodes at all nodal stations, and intermittent abnormally increased lymph node size. As before the largest nodes are at the level 2 and level 3 stations ranging from 11-14 millimeters short axis. These are stable since 05/23/2017. Sub centimeter but rounded and increased in number lymph nodes elsewhere throughout the neck, including in both parotid spaces (series 2, image 21 on the right) also appear stable. No increased, cystic, or necrotic nodes. Vascular: The major vascular structures in the neck and at the skull base remain patent. No cervical carotid atherosclerosis. Limited intracranial: Stable and negative. Visualized orbits: Negative. Mastoids and visualized paranasal sinuses: Clear. Skeleton: Stable mild for age cervical spine degeneration. No acute or suspicious osseous lesion in the neck or at the skull base. Upper chest: Reported separately today. IMPRESSION: 1. Stable appearance of CLL disease. Stable moderate generalized lymphadenopathy throughout the neck since October 2018. 2. Stable left thyroid  nodule, which corresponds to the hypermetabolic nodule described on the 07/17/2016 PET-CT. 3. No new abnormality in the neck. 4.  CT Chest, Abdomen, and Pelvis today are reported separately. Electronically Signed   By: Genevie Ann M.D.   On: 11/11/2017 10:42   Ct Chest W Contrast  Result Date: 11/11/2017 CLINICAL DATA:  Chronic lymphocytic leukemia. EXAM: CT CHEST, ABDOMEN, AND PELVIS WITH CONTRAST TECHNIQUE: Multidetector CT imaging of the chest, abdomen and pelvis was performed following the standard protocol during bolus administration of intravenous contrast. CONTRAST:  112mL ISOVUE-300 IOPAMIDOL (ISOVUE-300) INJECTION 61% COMPARISON:  05/23/2017 FINDINGS: CT CHEST FINDINGS Cardiovascular: Aortic atherosclerosis. Tortuous thoracic aorta. Normal heart size, without pericardial effusion. No central pulmonary embolism, on this non-dedicated study. Pulmonary artery enlargement, outflow tract 3.4 cm. Mediastinum/Nodes: Enlargement heterogeneity of the left lobe of the thyroid, similar. Anterior left lobe/isthmic nodule is similar, including at 1.6 cm.  Bilateral axillary adenopathy. Index right axillary node measures 1.3 cm on image 15/6 and is similar. A left axillary index node measures 1.4 cm on image 17/6 and is unchanged. 1.4 cm subcarinal node on image 27/6 is unchanged. 1.5 cm right hilar node on image 25/6 is felt to be similar (when remeasured). Lungs/Pleura: No pleural fluid.  Left base scarring. Musculoskeletal: No acute osseous abnormality. CT ABDOMEN PELVIS FINDINGS Hepatobiliary: Normal liver. Cholecystectomy, without biliary ductal dilatation. Pancreas: Normal, without mass or ductal dilatation. Spleen: Splenomegaly, 16.3 cm craniocaudal. Compare 15.9 cm on the prior exam (when remeasured). Adrenals/Urinary Tract: Normal adrenal glands. Left renal too small to characterize lesions. Normal right kidney, without hydronephrosis or hydroureter. Normal urinary bladder. Stomach/Bowel: Normal stomach, without  wall thickening. Extensive colonic diverticulosis. Normal terminal ileum and appendix. Normal small bowel. Vascular/Lymphatic: Aortic atherosclerosis. Aortocaval node measures 9 mm on image 65/6 and is unchanged. Gastrohepatic ligament nodes of up to 1.3 cm on image 53/6, similar. Index porta hepatis node is similar at 1.8 cm on image 58/6. Right common iliac node is similar at 1.4 cm on image 90/6. Left external iliac node of 1.5 cm on image 109/6, 1.3 cm on the prior. 1.5 cm left inguinal node on image 116/6, 1.5 cm on the prior. Reproductive: Normal uterus and adnexa. Other: No significant free fluid.  Mild pelvic floor laxity. Musculoskeletal: Mild degenerative sclerosis of the bilateral sacroiliac joints is not significantly changed. Lumbosacral spondylosis. IMPRESSION: 1. Similar adenopathy within the chest, abdomen, and pelvis. 2. Splenomegaly is similar to slightly increased. 3.  Aortic Atherosclerosis (ICD10-I70.0). Electronically Signed   By: Abigail Miyamoto M.D.   On: 11/11/2017 10:52   Ct Abdomen Pelvis W Contrast  Result Date: 11/11/2017 CLINICAL DATA:  Chronic lymphocytic leukemia. EXAM: CT CHEST, ABDOMEN, AND PELVIS WITH CONTRAST TECHNIQUE: Multidetector CT imaging of the chest, abdomen and pelvis was performed following the standard protocol during bolus administration of intravenous contrast. CONTRAST:  134mL ISOVUE-300 IOPAMIDOL (ISOVUE-300) INJECTION 61% COMPARISON:  05/23/2017 FINDINGS: CT CHEST FINDINGS Cardiovascular: Aortic atherosclerosis. Tortuous thoracic aorta. Normal heart size, without pericardial effusion. No central pulmonary embolism, on this non-dedicated study. Pulmonary artery enlargement, outflow tract 3.4 cm. Mediastinum/Nodes: Enlargement heterogeneity of the left lobe of the thyroid, similar. Anterior left lobe/isthmic nodule is similar, including at 1.6 cm. Bilateral axillary adenopathy. Index right axillary node measures 1.3 cm on image 15/6 and is similar. A left axillary  index node measures 1.4 cm on image 17/6 and is unchanged. 1.4 cm subcarinal node on image 27/6 is unchanged. 1.5 cm right hilar node on image 25/6 is felt to be similar (when remeasured). Lungs/Pleura: No pleural fluid.  Left base scarring. Musculoskeletal: No acute osseous abnormality. CT ABDOMEN PELVIS FINDINGS Hepatobiliary: Normal liver. Cholecystectomy, without biliary ductal dilatation. Pancreas: Normal, without mass or ductal dilatation. Spleen: Splenomegaly, 16.3 cm craniocaudal. Compare 15.9 cm on the prior exam (when remeasured). Adrenals/Urinary Tract: Normal adrenal glands. Left renal too small to characterize lesions. Normal right kidney, without hydronephrosis or hydroureter. Normal urinary bladder. Stomach/Bowel: Normal stomach, without wall thickening. Extensive colonic diverticulosis. Normal terminal ileum and appendix. Normal small bowel. Vascular/Lymphatic: Aortic atherosclerosis. Aortocaval node measures 9 mm on image 65/6 and is unchanged. Gastrohepatic ligament nodes of up to 1.3 cm on image 53/6, similar. Index porta hepatis node is similar at 1.8 cm on image 58/6. Right common iliac node is similar at 1.4 cm on image 90/6. Left external iliac node of 1.5 cm on image 109/6, 1.3 cm on the prior. 1.5  cm left inguinal node on image 116/6, 1.5 cm on the prior. Reproductive: Normal uterus and adnexa. Other: No significant free fluid.  Mild pelvic floor laxity. Musculoskeletal: Mild degenerative sclerosis of the bilateral sacroiliac joints is not significantly changed. Lumbosacral spondylosis. IMPRESSION: 1. Similar adenopathy within the chest, abdomen, and pelvis. 2. Splenomegaly is similar to slightly increased. 3.  Aortic Atherosclerosis (ICD10-I70.0). Electronically Signed   By: Abigail Miyamoto M.D.   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  PLAN:    1. CLL: CT scan results from November 11, 2017 reviewed independently and reported as  above with unchanged minimal lymphadenopathy as well as mild splenomegaly.  Her white blood cell count has only mildly increased to 30.2.  Patient's last infusion of Rituxan was on May 17, 2016.  No intervention is needed at this time.  Return to clinic in 6 months with repeat imaging, laboratory work, and further evaluation.   2. Early satiety, nausea, vomiting: Patient does not complain of this today. Splenomegaly significantly improved with treatment. 3. Atrial fibrillation: Continue monitoring and treatment per cardiology. 4. Iron deficiency anemia: Patient's hemoglobin continues to be within normal limits.  She last received IV Feraheme in May 2017.  5. PET positive thyroid lesions: 1.2 cm right lobe nodule also seen on thyroid ultrasound April 06, 2016. Recommendation at that time was annual follow-up.   Approximately 20 minutes was spent in discussion of which greater than 50% was consultation.  Patient expressed understanding and was in agreement with this plan. She also understands that She can call clinic at any time with any questions, concerns, or complaints.     Lloyd Huger, MD   11/23/2017 10:50 AM

## 2017-11-21 ENCOUNTER — Encounter: Payer: Self-pay | Admitting: Oncology

## 2017-11-21 ENCOUNTER — Inpatient Hospital Stay: Payer: Medicare Other | Attending: Oncology

## 2017-11-21 ENCOUNTER — Other Ambulatory Visit: Payer: Self-pay

## 2017-11-21 ENCOUNTER — Inpatient Hospital Stay (HOSPITAL_BASED_OUTPATIENT_CLINIC_OR_DEPARTMENT_OTHER): Payer: Medicare Other | Admitting: Oncology

## 2017-11-21 VITALS — BP 116/65 | HR 73 | Temp 95.7°F | Resp 18 | Wt 254.2 lb

## 2017-11-21 DIAGNOSIS — R161 Splenomegaly, not elsewhere classified: Secondary | ICD-10-CM | POA: Insufficient documentation

## 2017-11-21 DIAGNOSIS — Z87891 Personal history of nicotine dependence: Secondary | ICD-10-CM

## 2017-11-21 DIAGNOSIS — R450 Nervousness: Secondary | ICD-10-CM | POA: Diagnosis not present

## 2017-11-21 DIAGNOSIS — E041 Nontoxic single thyroid nodule: Secondary | ICD-10-CM | POA: Diagnosis not present

## 2017-11-21 DIAGNOSIS — R5383 Other fatigue: Secondary | ICD-10-CM

## 2017-11-21 DIAGNOSIS — I480 Paroxysmal atrial fibrillation: Secondary | ICD-10-CM | POA: Insufficient documentation

## 2017-11-21 DIAGNOSIS — C9110 Chronic lymphocytic leukemia of B-cell type not having achieved remission: Secondary | ICD-10-CM

## 2017-11-21 DIAGNOSIS — R531 Weakness: Secondary | ICD-10-CM | POA: Diagnosis not present

## 2017-11-21 LAB — CBC WITH DIFFERENTIAL/PLATELET
BASOS ABS: 0.1 10*3/uL (ref 0–0.1)
BASOS PCT: 0 %
EOS ABS: 0.2 10*3/uL (ref 0–0.7)
EOS PCT: 1 %
HCT: 42.9 % (ref 35.0–47.0)
HEMOGLOBIN: 14.2 g/dL (ref 12.0–16.0)
Lymphocytes Relative: 86 %
Lymphs Abs: 26 10*3/uL — ABNORMAL HIGH (ref 1.0–3.6)
MCH: 31.5 pg (ref 26.0–34.0)
MCHC: 33.1 g/dL (ref 32.0–36.0)
MCV: 95 fL (ref 80.0–100.0)
Monocytes Absolute: 0.4 10*3/uL (ref 0.2–0.9)
Monocytes Relative: 1 %
NEUTROS PCT: 12 %
Neutro Abs: 3.5 10*3/uL (ref 1.4–6.5)
PLATELETS: 128 10*3/uL — AB (ref 150–440)
RBC: 4.52 MIL/uL (ref 3.80–5.20)
RDW: 14.6 % — ABNORMAL HIGH (ref 11.5–14.5)
WBC: 30.2 10*3/uL — ABNORMAL HIGH (ref 3.6–11.0)

## 2017-11-21 LAB — COMPREHENSIVE METABOLIC PANEL
ALBUMIN: 4.2 g/dL (ref 3.5–5.0)
ALK PHOS: 92 U/L (ref 38–126)
ALT: 23 U/L (ref 14–54)
ANION GAP: 7 (ref 5–15)
AST: 20 U/L (ref 15–41)
BUN: 18 mg/dL (ref 6–20)
CALCIUM: 10.2 mg/dL (ref 8.9–10.3)
CO2: 26 mmol/L (ref 22–32)
CREATININE: 0.81 mg/dL (ref 0.44–1.00)
Chloride: 108 mmol/L (ref 101–111)
GFR calc non Af Amer: >60 mL/min (ref >60)
GLUCOSE: 90 mg/dL (ref 65–99)
Potassium: 4.2 mmol/L (ref 3.5–5.1)
SODIUM: 141 mmol/L (ref 135–145)
Total Bilirubin: 1 mg/dL (ref 0.3–1.2)
Total Protein: 6.5 g/dL (ref 6.5–8.1)

## 2017-11-21 NOTE — Progress Notes (Signed)
Here for follow up. Per pt feeling fatigued -ongoing c/o .

## 2017-11-26 ENCOUNTER — Telehealth: Payer: Self-pay | Admitting: Cardiology

## 2017-11-26 MED ORDER — METOPROLOL TARTRATE 25 MG PO TABS: 12.5000 mg | ORAL_TABLET | Freq: Two times a day (BID) | ORAL | 3 refills | Status: DC

## 2017-11-26 NOTE — Telephone Encounter (Signed)
Patient is requesting appointment with Dr.McDowell in regards to medication concerns. No availability until June and no APP appointments until May 17th. Patient stated she couldn't wait that long. Advised that nurse would call to discuss her concerns and relay to MD. / tg

## 2017-11-26 NOTE — Telephone Encounter (Signed)
Patient states she feels fatigued, HR at times is in the 50's, once as low as 45.While up and about is 50-70's  BP she states is good.at recent oncology visit was 116/65.She wonders if you need to change her meds

## 2017-11-26 NOTE — Telephone Encounter (Signed)
She has been on Lopressor 25 mg twice daily.  Cut the dose back to 12.5 mg twice daily.

## 2017-11-26 NOTE — Telephone Encounter (Signed)
I spoke  with pt, she will decrease lopressor to 12.5 mg BIB

## 2017-12-20 ENCOUNTER — Encounter: Payer: Self-pay | Admitting: Gastroenterology

## 2017-12-20 ENCOUNTER — Telehealth: Payer: Self-pay | Admitting: *Deleted

## 2017-12-20 ENCOUNTER — Ambulatory Visit (INDEPENDENT_AMBULATORY_CARE_PROVIDER_SITE_OTHER): Payer: Medicare Other | Admitting: Gastroenterology

## 2017-12-20 VITALS — BP 120/76 | HR 66 | Temp 97.9°F | Ht 67.0 in | Wt 249.8 lb

## 2017-12-20 DIAGNOSIS — F458 Other somatoform disorders: Secondary | ICD-10-CM | POA: Diagnosis not present

## 2017-12-20 DIAGNOSIS — K625 Hemorrhage of anus and rectum: Secondary | ICD-10-CM | POA: Diagnosis not present

## 2017-12-20 DIAGNOSIS — R1013 Epigastric pain: Secondary | ICD-10-CM

## 2017-12-20 DIAGNOSIS — R0989 Other specified symptoms and signs involving the circulatory and respiratory systems: Secondary | ICD-10-CM

## 2017-12-20 MED ORDER — LIDOCAINE VISCOUS HCL 2 % MT SOLN: 15.0000 mL | Freq: Four times a day (QID) | OROMUCOSAL | 2 refills | Status: DC | PRN

## 2017-12-20 NOTE — Assessment & Plan Note (Signed)
Recurrent epigastric pain, also noted in Feb of last year but did not want to pursue endoscopic evaluation at that time. Gallbladder absent. Notes recurrent globus sensation as well but no true esophageal dysphagia. As of note, history of CLL, with known splenomegaly. Recent scans overall stable. Will stop Protonix BID and trial Dexilant. Patient to call with update. Will pursue EGD with possible dilation in near future, as she has never had an upper endoscopy.   Proceed with upper endoscopy/dilation in the near future with Dr. Gala Romney. The risks, benefits, and alternatives have been discussed in detail with patient. They have stated understanding and desire to proceed.  Propofol due to polypharmacy Hold Eliquis 48 hours prior  Return in follow-up after procedures

## 2017-12-20 NOTE — Assessment & Plan Note (Signed)
67 year old female with history of rectal bleeding now improved, no prior colonoscopy. On Eliquis with history of afib. Likely benign anorectal source but needs further evaluation.  Proceed with TCS with Dr. Gala Romney in near future: the risks, benefits, and alternatives have been discussed with the patient in detail. The patient states understanding and desires to proceed. Propofol due to polypharmacy Hold Eliquis X 48 hours

## 2017-12-20 NOTE — Progress Notes (Signed)
Primary Care Physician:  Renee Rival, NP  Primary GI: Dr. Gala Romney   Chief Complaint  Patient presents with  . Abdominal Pain    x 2 weeks, left side. was seen at [facility] 2 weeks ago and was prescribed augmentin  . Nausea  . Diarrhea    little bit not much    HPI:   Daisy Black is a 67 y.o. female presenting today with a history of CLL, followed at [facility]. She had a flare in GERD symptoms a year ago, seen by [facility]. At that time, she did not want to pursue endoscopic measures, so an UGI was completed  in Feb 2018 with normal findings. She has never had a colonoscopy. Was seen in Dec 2018 with rectal bleeding. On Eliquis. Colonoscopy was arranged, but she had to cancel. Presents today to reschedule.   Ct abd/pelvis with contrast April 2019 with extensive diverticulosis. States she had terrible abdominal pain and went to [facility] Saturday May 18th. Was prescribed Augmentin for presumed diverticulitis. No imaging done. States she is improved from LLQ discomfort. Also notes recurrent upper abdominal discomfort. Known splenomegaly with history of CLL. No dysphagia but notes globus sensation. Some vague epigastric discomfort after eating. Has been eating soft foods. Mild loose stool but not enough to mention. No further rectal bleeding. When abdomen hurts, taking tylenol and motrin together. Protonix BID.   Past Medical History:  Diagnosis Date  . Anemia   . Arthritis   . CLL (chronic lymphocytic leukemia) ([place])   . Essential hypertension   . GERD (gastroesophageal reflux disease)   . History of hiatal hernia   . Hyperlipidemia   . Paroxysmal atrial fibrillation (HCC)   . Splenomegaly     Past Surgical History:  Procedure Laterality Date  . ANKLE SURGERY Right    repair fracture  . CARDIOVERSION N/A 03/20/2016   Procedure: CARDIOVERSION;  Surgeon: Satira Sark, MD;  Location: [facility];  Service: Cardiovascular;  Laterality: N/A;  .  CARDIOVERSION N/A 05/22/2016   Procedure: CARDIOVERSION;  Surgeon: Satira Sark, MD;  Location: [facility];  Service: Cardiovascular;  Laterality: N/A;  . CHOLECYSTECTOMY    . HERNIA REPAIR  2011   Incisional and umbilical utilizing mesh  . ROTATOR CUFF REPAIR Left   . TEE WITHOUT CARDIOVERSION N/A 01/17/2016   Procedure: TRANSESOPHAGEAL ECHOCARDIOGRAM (TEE);  Surgeon: Herminio Commons, MD;  Location: [facility];  Service: Cardiovascular;  Laterality: N/A;  . TEE WITHOUT CARDIOVERSION N/A 02/21/2016   Procedure: TRANSESOPHAGEAL ECHOCARDIOGRAM (TEE) WITH PROPOFOL;  Surgeon: Herminio Commons, MD;  Location: [facility];  Service: Cardiovascular;  Laterality: N/A;  . TEE WITHOUT CARDIOVERSION N/A 03/20/2016   Procedure: TRANSESOPHAGEAL ECHOCARDIOGRAM (TEE) WITH PROPOFOL;  Surgeon: Satira Sark, MD;  Location: [facility];  Service: Cardiovascular;  Laterality: N/A;  . TONSILLECTOMY      Current Outpatient Medications  Medication Sig Dispense Refill  . acetaminophen (TYLENOL) 650 MG CR tablet Take 650 mg by mouth every 8 (eight) hours as needed for pain.     Marland Kitchen apixaban (ELIQUIS) 5 MG TABS tablet Take 1 tablet (5 mg total) by mouth 2 (two) times daily. 90 tablet 3  . cetirizine (ZYRTEC) 10 MG tablet Take 10 mg by mouth daily as needed for allergies.    . Ferrous Fumarate (IRON) 18 MG TBCR Take 1 tablet by mouth daily.     . flecainide (TAMBOCOR) 50 MG tablet TAKE 1 TABLET (50 MG TOTAL) BY  MOUTH 2 (TWO) TIMES DAILY. 180 tablet 3  . fluticasone (FLONASE) 50 MCG/ACT nasal spray Place 1 spray into both nostrils daily as needed for allergies or rhinitis.    . furosemide (LASIX) 20 MG tablet TAKE 1 TABLET BY MOUTH DAILY AS NEEDED FOR SWELLING 30 tablet 6  . levothyroxine (SYNTHROID, LEVOTHROID) 125 MCG tablet Take 125 mcg by mouth daily before breakfast.    . metoprolol tartrate (LOPRESSOR) 25 MG tablet Take 0.5 tablets (12.5 mg total) by mouth 2 (two) times daily. 90 tablet 3  . nystatin  (MYCOSTATIN/NYSTOP) powder Apply topically 2 (two) times daily. To affected area of abdomen 30 g 1  . pantoprazole (PROTONIX) 40 MG tablet TAKE ONE TABLET BY MOUTH TWO TIMES A DAY WITH FOOD 60 tablet 3  . promethazine (PHENERGAN) 12.5 MG tablet Take 1 tablet (12.5 mg total) by mouth every 6 (six) hours as needed for nausea or vomiting. 30 tablet 0  . promethazine (PHENERGAN) 25 MG suppository Place 1 suppository (25 mg total) rectally every 6 (six) hours as needed for nausea or vomiting. 12 each 1   No current facility-administered medications for this visit.    Facility-Administered Medications Ordered in Other Visits  Medication Dose Route Frequency Provider Last Rate Last Dose  . hydrocortisone cream 1 % 1 application  1 application Topical TID PRN Satira Sark, MD        Allergies as of 12/20/2017  . (No Known Allergies)    Family History  Problem Relation Age of Onset  . Ovarian cancer Mother 30       Secondary to ovarian cancer  . Leukemia Mother   . Stroke Father 10       Brain stem infarction  . Breast cancer Paternal Aunt   . Crohn's disease Son   . Colon cancer Neg Hx     Social History   Socioeconomic History  . Marital status: Married    Spouse name: Not on file  . Number of children: 27  . Years of education: Not on file  . Highest education level: Not on file  Occupational History  . Not on file  Social Needs  . Financial resource strain: Not on file  . Food insecurity:    Worry: Not on file    Inability: Not on file  . Transportation needs:    Medical: Not on file    Non-medical: Not on file  Tobacco Use  . Smoking status: Former Smoker    Packs/day: 0.25    Years: 2.00    Pack years: 0.50    Types: Cigarettes    Last attempt to quit: 08/03/1975    Years since quitting: 42.4  . Smokeless tobacco: Never Used  Substance and Sexual Activity  . Alcohol use: No    Alcohol/week: 0.0 oz  . Drug use: No  . Sexual activity: Yes    Birth  control/protection: Post-menopausal  Lifestyle  . Physical activity:    Days per week: Not on file    Minutes per session: Not on file  . Stress: Not on file  Relationships  . Social connections:    Talks on phone: Not on file    Gets together: Not on file    Attends religious service: Not on file    Active member of club or organization: Not on file    Attends meetings of clubs or organizations: Not on file    Relationship status: Not on file  Other Topics Concern  . Not  on file  Social History Narrative  . Not on file    Review of Systems: Gen: Denies fever, chills, anorexia. Denies fatigue, weakness, weight loss.  CV: Denies chest pain, palpitations, syncope, peripheral edema, and claudication. Resp: Denies dyspnea at rest, cough, wheezing, coughing up blood, and pleurisy. GI: see HPI  Derm: Denies rash, itching, dry skin Psych: Denies depression, anxiety, memory loss, confusion. No homicidal or suicidal ideation.  Heme: Denies bruising, bleeding, and enlarged lymph nodes.  Physical Exam: BP 120/76   Pulse 66   Temp 97.9 F (36.6 C) (Oral)   Ht 5\' 7"  (1.702 m)   Wt 249 lb 12.8 oz (113.3 kg)   BMI 39.12 kg/m  General:   Alert and oriented. No distress noted. Pleasant and cooperative.  Head:  Normocephalic and atraumatic. Eyes:  Conjuctiva clear without scleral icterus. Mouth:  Oral mucosa pink and moist.  Cardiac: S1 S2 present, regular Lungs: CTAB  Abdomen:  +BS, soft, mild epigastric tenderness and non-distended. No rebound or guarding. No HSM or masses noted. Msk:  Symmetrical without gross deformities. Normal posture. Extremities:  Without edema. Neurologic:  Alert and  oriented x4 Psych:  Alert and cooperative. Normal mood and affect.

## 2017-12-20 NOTE — Patient Instructions (Signed)
Let's stop Protonix. Start Dexilant samples once each day. Let me know how this works for you. I also sent in the liquid lidocaine again to take every 4 hours as needed for abdominal discomfort. IF you do not improve, please call our office.  We have scheduled you for the colonoscopy, upper endoscopy, and possible dilation with Dr. Gala Romney in the near future. STOP ELIQUIS 2 DAYS BEFORE THE PROCEDURE.   We will see you after the procedure!  It was a pleasure to see you today. I strive to create trusting relationships with patients to provide genuine, compassionate, and quality care. I value your feedback. If you receive a survey regarding your visit,  I greatly appreciate you taking time to fill this out.   Annitta Needs, PhD, ANP-BC [facility]

## 2017-12-20 NOTE — Telephone Encounter (Signed)
LMOVM for pt to scheduled TCS/EGD/DIL W/ RMR W/ PROPOFOL

## 2017-12-24 NOTE — Progress Notes (Signed)
CC'D TO PCP °

## 2017-12-24 NOTE — Telephone Encounter (Addendum)
LMOVM. Will send letter to call

## 2018-01-06 ENCOUNTER — Telehealth: Payer: Self-pay | Admitting: Internal Medicine

## 2018-01-06 ENCOUNTER — Other Ambulatory Visit: Payer: Self-pay | Admitting: *Deleted

## 2018-01-06 ENCOUNTER — Other Ambulatory Visit: Payer: Self-pay

## 2018-01-06 DIAGNOSIS — R0989 Other specified symptoms and signs involving the circulatory and respiratory systems: Secondary | ICD-10-CM

## 2018-01-06 DIAGNOSIS — K625 Hemorrhage of anus and rectum: Secondary | ICD-10-CM

## 2018-01-06 MED ORDER — NA SULFATE-K SULFATE-MG SULF 17.5-3.13-1.6 GM/177ML PO SOLN: 1.0000 | ORAL | 0 refills | Status: DC

## 2018-01-06 MED ORDER — APIXABAN 5 MG PO TABS: 5.0000 mg | ORAL_TABLET | Freq: Two times a day (BID) | ORAL | 0 refills | Status: DC

## 2018-01-06 NOTE — Telephone Encounter (Signed)
Patient called requesting hand prescription for eliquis.

## 2018-01-06 NOTE — Telephone Encounter (Signed)
Called pt, TCS/EGD/DIL w/Propofol w/RMR scheduled for 03/27/18 at 1:00pm. Aware to hold Eliquis for 48 hours. Rx for prep sent to pharmacy. Orders entered. Will mail instructions after pre-op appt scheduled.

## 2018-01-06 NOTE — Telephone Encounter (Signed)
PATIENT CALLED TO SCHEDULE  HER ENDO AND TCS

## 2018-01-06 NOTE — Telephone Encounter (Signed)
Called and informed pt of pre-op appt 03/20/18 at 10:00am. Letter mailed with procedure instructions.

## 2018-01-06 NOTE — Telephone Encounter (Signed)
I'm sorry, thanks for the reminder. I sent a message today. We still need to keep with plans as already arranged. I will let you know when I hear back.

## 2018-01-06 NOTE — Telephone Encounter (Signed)
Routing message. AB please advise.

## 2018-01-06 NOTE — Telephone Encounter (Signed)
Noted. Pt is aware 

## 2018-01-06 NOTE — Telephone Encounter (Signed)
PATIENT SAID Daisy Black WAS SUPPOSED TO CONTACT HER ONCOLOGIST ABOUT HER CARE    PATIENT INQUIRING IF SHE EVER SPOKE TO THEM

## 2018-01-07 NOTE — Telephone Encounter (Signed)
Conferred with Oncology. Splenomegaly is chronic and these symptoms are fairly new, so it is unlikely that splenomegaly is contributing when it has been a chronic issue. Continue with plans for colonoscopy/EGD.

## 2018-01-07 NOTE — Telephone Encounter (Signed)
Noted. Pt notified and will plan for TCS/EGD.

## 2018-01-09 ENCOUNTER — Other Ambulatory Visit: Payer: Self-pay | Admitting: *Deleted

## 2018-01-09 MED ORDER — FUROSEMIDE 20 MG PO TABS: 20.0000 mg | ORAL_TABLET | Freq: Every day | ORAL | 6 refills | Status: DC | PRN

## 2018-01-22 ENCOUNTER — Other Ambulatory Visit: Payer: Self-pay | Admitting: Nurse Practitioner

## 2018-01-24 NOTE — Telephone Encounter (Signed)
Please verify she needs protonix. Per 11/2017 OV note, she was switched to [place].

## 2018-01-24 NOTE — Telephone Encounter (Signed)
Pt said samples of Dexilant were given to her, but they did not work. So she went back to the pantoprazole and it is working ok.

## 2018-01-24 NOTE — Telephone Encounter (Signed)
RX done. 

## 2018-01-31 ENCOUNTER — Encounter: Payer: Self-pay | Admitting: Cardiology

## 2018-01-31 ENCOUNTER — Ambulatory Visit (INDEPENDENT_AMBULATORY_CARE_PROVIDER_SITE_OTHER): Payer: Medicare Other | Admitting: Cardiology

## 2018-01-31 ENCOUNTER — Other Ambulatory Visit: Payer: Self-pay | Admitting: *Deleted

## 2018-01-31 VITALS — BP 124/72 | HR 72 | Ht 67.5 in | Wt 254.2 lb

## 2018-01-31 DIAGNOSIS — I10 Essential (primary) hypertension: Secondary | ICD-10-CM | POA: Diagnosis not present

## 2018-01-31 DIAGNOSIS — I480 Paroxysmal atrial fibrillation: Secondary | ICD-10-CM

## 2018-01-31 DIAGNOSIS — Z5181 Encounter for therapeutic drug level monitoring: Secondary | ICD-10-CM

## 2018-01-31 DIAGNOSIS — R001 Bradycardia, unspecified: Secondary | ICD-10-CM | POA: Diagnosis not present

## 2018-01-31 MED ORDER — APIXABAN 5 MG PO TABS: 5.0000 mg | ORAL_TABLET | Freq: Two times a day (BID) | ORAL | 3 refills | Status: DC

## 2018-01-31 MED ORDER — METOPROLOL TARTRATE 25 MG PO TABS: 25.0000 mg | ORAL_TABLET | Freq: Two times a day (BID) | ORAL | 1 refills | Status: DC

## 2018-01-31 NOTE — Telephone Encounter (Signed)
#  56 eliquis 5 mg samples given Lot [id number] Exp 02-2020

## 2018-01-31 NOTE — Patient Instructions (Addendum)
Medication Instructions:   Your physician recommends that you continue on your current medications as directed. Please refer to the Current Medication list given to you today.  Labwork:  Your physician recommends that you return for lab work in: trough flecainide level. Please have this done just prior to your dose that is due the morning that you go.   Testing/Procedures:  NONE  Follow-Up:  Your physician recommends that you schedule a follow-up appointment in: 3 months.  Any Other Special Instructions Will Be Listed Below (If Applicable).  If you need a refill on your cardiac medications before your next appointment, please call your pharmacy.

## 2018-01-31 NOTE — Progress Notes (Signed)
Cardiology Office Note  Date: 01/31/2018   ID: Black, Daisy 1951-01-29, MRN [id number]  PCP: Daisy Rival, NP  Primary Cardiologist: Daisy Lesches, MD   Chief Complaint  Patient presents with  . Atrial Fibrillation    History of Present Illness: Daisy Black is a 67 y.o. female last seen in January.  She is here with her daughter for a follow-up visit today.  She states that she has been having more frequent episodes of palpitations, likely recurring atrial fibrillation.  Longest episodes of lasted about 15 minutes.  She already increased her Lopressor back to 25 mg twice a day.  I personally reviewed her ECG today which shows sinus rhythm with prolonged PR interval and QRS duration 94 ms.  QT interval is normal.  She reports compliance with her medications, no missed doses of Eliquis.  Past Medical History:  Diagnosis Date  . Anemia   . Arthritis   . CLL (chronic lymphocytic leukemia) (Daisy Black)   . Essential hypertension   . GERD (gastroesophageal reflux disease)   . History of hiatal hernia   . Hyperlipidemia   . Paroxysmal atrial fibrillation (HCC)   . Splenomegaly     Past Surgical History:  Procedure Laterality Date  . ANKLE SURGERY Right    repair fracture  . CARDIOVERSION N/A 03/20/2016   Procedure: CARDIOVERSION;  Surgeon: Daisy Sark, MD;  Location: [facility];  Service: Cardiovascular;  Laterality: N/A;  . CARDIOVERSION N/A 05/22/2016   Procedure: CARDIOVERSION;  Surgeon: Daisy Sark, MD;  Location: [facility];  Service: Cardiovascular;  Laterality: N/A;  . CHOLECYSTECTOMY    . HERNIA REPAIR  2011   Incisional and umbilical utilizing mesh  . ROTATOR CUFF REPAIR Left   . TEE WITHOUT CARDIOVERSION N/A 01/17/2016   Procedure: TRANSESOPHAGEAL ECHOCARDIOGRAM (TEE);  Surgeon: Daisy Commons, MD;  Location: [facility];  Service: Cardiovascular;  Laterality: N/A;  . TEE WITHOUT CARDIOVERSION N/A 02/21/2016   Procedure: TRANSESOPHAGEAL  ECHOCARDIOGRAM (TEE) WITH PROPOFOL;  Surgeon: Daisy Commons, MD;  Location: [facility];  Service: Cardiovascular;  Laterality: N/A;  . TEE WITHOUT CARDIOVERSION N/A 03/20/2016   Procedure: TRANSESOPHAGEAL ECHOCARDIOGRAM (TEE) WITH PROPOFOL;  Surgeon: Daisy Sark, MD;  Location: [facility];  Service: Cardiovascular;  Laterality: N/A;  . TONSILLECTOMY      Current Outpatient Medications  Medication Sig Dispense Refill  . acetaminophen (TYLENOL) 650 MG CR tablet Take 650 mg by mouth every 8 (eight) hours as needed for pain.     Marland Kitchen apixaban (ELIQUIS) 5 MG TABS tablet Take 1 tablet (5 mg total) by mouth 2 (two) times daily. 180 tablet 0  . cetirizine (ZYRTEC) 10 MG tablet Take 10 mg by mouth daily as needed for allergies.    . Ferrous Fumarate (IRON) 18 MG TBCR Take 1 tablet by mouth daily.     . flecainide (TAMBOCOR) 50 MG tablet TAKE 1 TABLET (50 MG TOTAL) BY MOUTH 2 (TWO) TIMES DAILY. 180 tablet 3  . fluticasone (FLONASE) 50 MCG/ACT nasal spray Place 1 spray into both nostrils daily as needed for allergies or rhinitis.    . furosemide (LASIX) 20 MG tablet Take 1 tablet (20 mg total) by mouth daily as needed. 30 tablet 6  . levothyroxine (SYNTHROID, LEVOTHROID) 125 MCG tablet Take 125 mcg by mouth daily before breakfast.    . metoprolol tartrate (LOPRESSOR) 25 MG tablet Take 1 tablet (25 mg total) by mouth 2 (two) times daily. 180 tablet 1  .  Na Sulfate-K Sulfate-Mg Sulf (SUPREP BOWEL PREP KIT) 17.5-3.13-1.6 GM/177ML SOLN Take 1 kit by mouth as directed. 1 Bottle 0  . nystatin (MYCOSTATIN/NYSTOP) powder Apply topically 2 (two) times daily. To affected area of abdomen 30 g 1  . pantoprazole (PROTONIX) 40 MG tablet TAKE ONE TABLET BY MOUTH TWO TIMES A DAY WITH FOOD 60 tablet 5   No current facility-administered medications for this visit.    Facility-Administered Medications Ordered in Other Visits  Medication Dose Route Frequency Provider Last Rate Last Dose  . hydrocortisone cream 1 % 1  application  1 application Topical TID PRN Daisy Sark, MD       Allergies:  Patient has no known allergies.   Social History: The patient  reports that she quit smoking about 42 years ago. Her smoking use included cigarettes. She has a 0.50 pack-year smoking history. She has never used smokeless tobacco. She reports that she does not drink alcohol or use drugs.   ROS:  Please see the history of present illness. Otherwise, complete review of systems is positive for none.  All other systems are reviewed and negative.   Physical Exam: VS:  BP 124/72   Pulse 72   Ht 5' 7.5" (1.715 m)   Wt 254 lb 3.2 oz (115.3 kg)   SpO2 98% Comment: on room air  BMI 39.23 kg/m , BMI Body mass index is 39.23 kg/m.  Wt Readings from Last 3 Encounters:  01/31/18 254 lb 3.2 oz (115.3 kg)  12/20/17 249 lb 12.8 oz (113.3 kg)  11/21/17 254 lb 3.2 oz (115.3 kg)    General: Patient appears comfortable at rest. HEENT: Conjunctiva and lids normal, oropharynx clear. Neck: Supple, no elevated JVP or carotid bruits, no thyromegaly. Lungs: Clear to auscultation, nonlabored breathing at rest. Cardiac: Regular rate and rhythm, no S3 or significant systolic murmur. Abdomen: Soft, nontender, bowel sounds present. Extremities: Trace ankle edema, distal pulses 2+. Skin: Warm and dry. Musculoskeletal: No kyphosis. Neuropsychiatric: Alert and oriented x3, affect grossly appropriate.  ECG: I personally reviewed the tracing from 08/23/2017 which showed sinus bradycardia with QRS 88 ms and normal QT.  Recent Labwork: 11/21/2017: ALT 23; AST 20; BUN 18; Creatinine, Ser 0.81; Hemoglobin 14.2; Platelets 128; Potassium 4.2; Sodium 141   Other Studies Reviewed Today:  TEE 03/20/2016: Study Conclusions  - Left ventricle: Systolic function was normal. The estimated ejection fraction was in the range of 60% to 65%. Wall motion was normal; there were no regional wall motion abnormalities. No evidence of  thrombus. - Descending aorta: The descending aorta had minor luminal irregularities. - Mitral valve: There was mild regurgitation. - Left atrium: The atrium was mildly dilated. No evidence of thrombus in the atrial cavity or appendage. Prominent trabeculation visualized at tip of appendage. Emptying velocity was moderately reduced. - Right atrium: No evidence of thrombus in the atrial cavity or appendage. - Tricuspid valve: There was mild regurgitation. - Pericardium, extracardiac: There was no pericardial effusion.  Impressions:  - LVEF 60-65% without wall motion abnormalities. No left atrial appendage thrombus visualized. There is prominent trabeculation noted at the appendage tip. Emptying velocity was moderately reduced. No right atrial appendage thrombus noted. Mild tricuspid regurgitation.  Assessment and Plan:  1.  Paroxysmal atrial fibrillation.  Continue Eliquis for stroke prophylaxis.  Continue Lopressor at 25 mg twice daily as well as current dose of flecainide 50 mg twice daily.  We will send a flecainide trough to ensure that she is in good range and does  not require increase in dose.  For recurring episodes of palpitations, she will take an extra Lopressor dose and for more prolonged episodes could actually take 100 mg of flecainide to help convert.  If episodes persist despite these interventions, she could always present to the ER for cardioversion presuming she has been consistent with Eliquis.  2.  Essential hypertension, blood pressure is adequately controlled today.  3.  History of bradycardia, asymptomatic at this time and tolerating current dose of Lopressor.  Current medicines were reviewed with the patient today.   Orders Placed This Encounter  Procedures  . Flecainide level  . EKG 12-Lead    Disposition: Follow-up in 3 months.  Signed, Daisy Sark, MD, [facility] 01/31/2018 9:58 AM    [facility] at  [place], [place], [place] [address] Phone: [phone]; Fax: [phone]

## 2018-02-06 ENCOUNTER — Telehealth: Payer: Self-pay

## 2018-02-06 MED ORDER — NYSTATIN 100000 UNIT/GM EX CREA: 1.0000 "application " | TOPICAL_CREAM | Freq: Two times a day (BID) | CUTANEOUS | 0 refills | Status: DC

## 2018-02-06 NOTE — Telephone Encounter (Signed)
PT is aware.

## 2018-02-06 NOTE — Telephone Encounter (Signed)
LMOM for a return call.  

## 2018-02-06 NOTE — Telephone Encounter (Signed)
Please have her get this from PCP in the future. I will send in one time for now.

## 2018-02-06 NOTE — Telephone Encounter (Signed)
Request from [facility] in [place], pt is requesting RX for Nystatin cream. Was previously given the powder. Vicente Males, Please advise!

## 2018-02-10 ENCOUNTER — Telehealth: Payer: Self-pay | Admitting: *Deleted

## 2018-02-10 MED ORDER — FLECAINIDE ACETATE 100 MG PO TABS: 100.0000 mg | ORAL_TABLET | Freq: Two times a day (BID) | ORAL | 3 refills | Status: DC

## 2018-02-10 NOTE — Telephone Encounter (Signed)
-----   Message from Satira Sark, MD sent at 02/07/2018 12:05 PM EDT ----- Results reviewed.  Flecainide level is low end of normal range at 0.29.  In light of her increasing palpitations, increase flecainide to 100 mg twice daily.  She should come in for an ECG check in about 1 week. A copy of this test should be forwarded to Renee Rival, NP.

## 2018-02-10 NOTE — Telephone Encounter (Signed)
Patient informed and verbalized understanding of plan. Copy sent to PCP 

## 2018-02-11 ENCOUNTER — Telehealth: Payer: Self-pay | Admitting: Cardiology

## 2018-02-11 NOTE — Telephone Encounter (Signed)
Pt appreciative and wanted to continue flecainide 50 mg bid at this time

## 2018-02-11 NOTE — Telephone Encounter (Signed)
Pt says after increased evening dose of Flecainide 100mg  around 8pm started having chest pain and tingling in the lips and says HR went up in the 80s when normally in the 60s - says this lasted about 3 hours. Denies having SOB/palpitations. Says this morning she took 50 mg of flecainide and denies any symptoms since episode last night says HR is in the 60s this morning. Wanted to know if she should go back to 50 mg bid

## 2018-02-11 NOTE — Telephone Encounter (Signed)
LM to return call.

## 2018-02-11 NOTE — Telephone Encounter (Signed)
Patient called stating that she increased her medication last evening and then started having chest pains, lips were tingling and she had some itching.

## 2018-02-11 NOTE — Telephone Encounter (Signed)
I wonder if those symptoms could have been coincidental as that would be somewhat unusual.  If she is satisfied with her control of palpitations on flecainide 50 mg twice daily, it would be reasonable to continue that dose.

## 2018-02-18 ENCOUNTER — Ambulatory Visit (INDEPENDENT_AMBULATORY_CARE_PROVIDER_SITE_OTHER): Payer: Medicare Other | Admitting: *Deleted

## 2018-02-18 DIAGNOSIS — I480 Paroxysmal atrial fibrillation: Secondary | ICD-10-CM

## 2018-02-18 MED ORDER — FLECAINIDE ACETATE 50 MG PO TABS: 75.0000 mg | ORAL_TABLET | Freq: Two times a day (BID) | ORAL | 3 refills | Status: DC

## 2018-02-18 NOTE — Progress Notes (Signed)
ECG shows sinus arrhythmia with prolonged PR interval and PAC.  QRS duration is normal.  As far as recurring palpitations and presumed paroxysmal atrial fibrillation, I wonder if she might be able to tolerate flecainide at 75 mg twice daily (1-1/2 tablets twice daily).  She can always consider trying that if symptoms worsen.

## 2018-02-18 NOTE — Progress Notes (Signed)
Present today for EKG due to recent increase in flecainide dose. Per last phone note, patient was unable to tolerate higher dose and resumed previous dose of fleciainide 50 mg BID. Patient denies chest pain, dizziness or sob. Patient has taken all doses of medications. Medications reconciled during visit.   Patient requesting advice for treatment of periodic episodes of elevated heart rates (140-150) she has been having that was reported at last office visit. Patient did report that they have improved since last visit.

## 2018-02-18 NOTE — Addendum Note (Signed)
Addended by: Merlene Laughter on: 02/18/2018 04:09 PM   Modules accepted: Orders

## 2018-02-18 NOTE — Patient Instructions (Signed)
Advised to continue same plan and she would be contacted with response from her provider.

## 2018-02-18 NOTE — Progress Notes (Signed)
Patient informed and verbalized understanding of plan. 

## 2018-02-21 ENCOUNTER — Ambulatory Visit: Payer: Medicare Other | Admitting: Cardiology

## 2018-03-20 ENCOUNTER — Encounter (HOSPITAL_COMMUNITY): Admission: RE | Admit: 2018-03-20 | Payer: Medicare Other | Source: Ambulatory Visit

## 2018-03-20 ENCOUNTER — Encounter (HOSPITAL_COMMUNITY): Payer: Self-pay

## 2018-03-24 NOTE — Patient Instructions (Signed)
Daisy Black  03/24/2018     @PREFPERIOPPHARMACY @   Your procedure is scheduled on 03/27/2018.  Report to [facility] at 1115 A.M.  Call this number if you have problems the morning of surgery:  [phone]   Remember:  Do not eat or drink after midnight. (Follow instructions given to you by Dr Roseanne Kaufman office.  Contact office with any questions you have regarding prep)      Take these medicines the morning of surgery with A SIP OF WATER Albuterol inhaler, Synthroid, Protonix, Trulance    Do not wear jewelry, make-up or nail polish.  Do not wear lotions, powders, or perfumes, or deodorant.  Do not shave 48 hours prior to surgery.  Men may shave face and neck.  Do not bring valuables to the hospital.  [facility] is not responsible for any belongings or valuables.  Contacts, dentures or bridgework may not be worn into surgery.  Leave your suitcase in the car.  After surgery it may be brought to your room.  For patients admitted to the hospital, discharge time will be determined by your treatment team.  Patients discharged the day of surgery will not be allowed to drive home.    Please read over the following fact sheets that you were given. Anesthesia Post-op Instructions        PATIENT INSTRUCTIONS POST-ANESTHESIA  IMMEDIATELY FOLLOWING SURGERY:  Do not drive or operate machinery for the first twenty four hours after surgery.  Do not make any important decisions for twenty four hours after surgery or while taking narcotic pain medications or sedatives.  If you develop intractable nausea and vomiting or a severe headache please notify your doctor immediately.  FOLLOW-UP:  Please make an appointment with your surgeon as instructed. You do not need to follow up with anesthesia unless specifically instructed to do so.  WOUND CARE INSTRUCTIONS (if applicable):  Keep a dry clean dressing on the anesthesia/puncture wound site if there is drainage.  Once the wound has  quit draining you may leave it open to air.  Generally you should leave the bandage intact for twenty four hours unless there is drainage.  If the epidural site drains for more than 36-48 hours please call the anesthesia department.  QUESTIONS?:  Please feel free to call your physician or the hospital operator if you have any questions, and they will be happy to assist you.      Esophagogastroduodenoscopy Esophagogastroduodenoscopy (EGD) is a procedure to examine the lining of the esophagus, stomach, and first part of the small intestine (duodenum). This procedure is done to check for problems such as inflammation, bleeding, ulcers, or growths. During this procedure, a long, flexible, lighted tube with a camera attached (endoscope) is inserted down the throat. Tell a health care provider about:  Any allergies you have.  All medicines you are taking, including vitamins, herbs, eye drops, creams, and over-the-counter medicines.  Any problems you or family members have had with anesthetic medicines.  Any blood disorders you have.  Any surgeries you have had.  Any medical conditions you have.  Whether you are pregnant or may be pregnant. What are the risks? Generally, this is a safe procedure. However, problems may occur, including:  Infection.  Bleeding.  A tear (perforation) in the esophagus, stomach, or duodenum.  Trouble breathing.  Excessive sweating.  Spasms of the larynx.  A slowed heartbeat.  Low blood pressure.  What happens before the procedure?  Follow instructions from  your health care provider about eating or drinking restrictions.  Ask your health care provider about: ? Changing or stopping your regular medicines. This is especially important if you are taking diabetes medicines or blood thinners. ? Taking medicines such as aspirin and ibuprofen. These medicines can thin your blood. Do not take these medicines before your procedure if your health care  provider instructs you not to.  Plan to have someone take you home after the procedure.  If you wear dentures, be ready to remove them before the procedure. What happens during the procedure?  To reduce your risk of infection, your health care team will wash or sanitize their hands.  An IV tube will be put in a vein in your hand or arm. You will get medicines and fluids through this tube.  You will be given one or more of the following: ? A medicine to help you relax (sedative). ? A medicine to numb the area (local anesthetic). This medicine may be sprayed into your throat. It will make you feel more comfortable and keep you from gagging or coughing during the procedure. ? A medicine for pain.  A mouth guard may be placed in your mouth to protect your teeth and to keep you from biting on the endoscope.  You will be asked to lie on your left side.  The endoscope will be lowered down your throat into your esophagus, stomach, and duodenum.  Air will be put into the endoscope. This will help your health care provider see better.  The lining of your esophagus, stomach, and duodenum will be examined.  Your health care provider may: ? Take a tissue sample so it can be looked at in a lab (biopsy). ? Remove growths. ? Remove objects (foreign bodies) that are stuck. ? Treat any bleeding with medicines or other devices that stop tissue from bleeding. ? Widen (dilate) or stretch narrowed areas of your esophagus and stomach.  The endoscope will be taken out. The procedure may vary among health care providers and hospitals. What happens after the procedure?  Your blood pressure, heart rate, breathing rate, and blood oxygen level will be monitored often until the medicines you were given have worn off.  Do not eat or drink anything until the numbing medicine has worn off and your gag reflex has returned. This information is not intended to replace advice given to you by your health care  provider. Make sure you discuss any questions you have with your health care provider. Document Released: 11/16/2004 Document Revised: 12/22/2015 Document Reviewed: 06/09/2015 Elsevier Interactive Patient Education  2018 [employer]. Esophageal Dilatation Esophageal dilatation is a procedure to open a blocked or narrowed part of the esophagus. The esophagus is the long tube in your throat that carries food and liquid from your mouth to your stomach. The procedure is also called esophageal dilation. You may need this procedure if you have a buildup of scar tissue in your esophagus that makes it difficult, painful, or even impossible to swallow. This can be caused by gastroesophageal reflux disease (GERD). In rare cases, people need this procedure because they have cancer of the esophagus or a problem with the way food moves through the esophagus. Sometimes you may need to have another dilatation to enlarge the opening of the esophagus gradually. Tell a health care provider about:  Any allergies you have.  All medicines you are taking, including vitamins, herbs, eye drops, creams, and over-the-counter medicines.  Any problems you or family members have  had with anesthetic medicines.  Any blood disorders you have.  Any surgeries you have had.  Any medical conditions you have.  Any antibiotic medicines you are required to take before dental procedures. What are the risks? Generally, this is a safe procedure. However, problems can occur and include:  Bleeding from a tear in the lining of the esophagus.  A hole (perforation) in the esophagus.  What happens before the procedure?  Do not eat or drink anything after midnight on the night before the procedure or as directed by your health care provider.  Ask your health care provider about changing or stopping your regular medicines. This is especially important if you are taking diabetes medicines or blood thinners.  Plan to have someone  take you home after the procedure. What happens during the procedure?  You will be given a medicine that makes you relaxed and sleepy (sedative).  A medicine may be sprayed or gargled to numb the back of the throat.  Your health care provider can use various instruments to do an esophageal dilatation. During the procedure, the instrument used will be placed in your mouth and passed down into your esophagus. Options include: ? Simple dilators. This instrument is carefully placed in the esophagus to stretch it. ? Guided wire bougies. In this method, a flexible tube (endoscope) is used to insert a wire into the esophagus. The dilator is passed over this wire to enlarge the esophagus. Then the wire is removed. ? Balloon dilators. An endoscope with a small balloon at the end is passed down into the esophagus. Inflating the balloon gently stretches the esophagus and opens it up. What happens after the procedure?  Your blood pressure, heart rate, breathing rate, and blood oxygen level will be monitored often until the medicines you were given have worn off.  Your throat may feel slightly sore and will probably still feel numb. This will improve slowly over time.  You will not be allowed to eat or drink until the throat numbness has resolved.  If this is a same-day procedure, you may be allowed to go home once you have been able to drink, urinate, and sit on the edge of the bed without nausea or dizziness.  If this is a same-day procedure, you should have a friend or family member with you for the next 24 hours after the procedure. This information is not intended to replace advice given to you by your health care provider. Make sure you discuss any questions you have with your health care provider. Document Released: 09/06/2005 Document Revised: 12/22/2015 Document Reviewed: 11/25/2013 Elsevier Interactive Patient Education  [employer]. Colonoscopy, Adult A colonoscopy is an exam to look  at the entire large intestine. During the exam, a lubricated, bendable tube is inserted into the anus and then passed into the rectum, colon, and other parts of the large intestine. A colonoscopy is often done as a part of normal colorectal screening or in response to certain symptoms, such as anemia, persistent diarrhea, abdominal pain, and blood in the stool. The exam can help screen for and diagnose medical problems, including:  Tumors.  Polyps.  Inflammation.  Areas of bleeding.  Tell a health care provider about:  Any allergies you have.  All medicines you are taking, including vitamins, herbs, eye drops, creams, and over-the-counter medicines.  Any problems you or family members have had with anesthetic medicines.  Any blood disorders you have.  Any surgeries you have had.  Any medical conditions you  have.  Any problems you have had passing stool. What are the risks? Generally, this is a safe procedure. However, problems may occur, including:  Bleeding.  A tear in the intestine.  A reaction to medicines given during the exam.  Infection (rare).  What happens before the procedure? Eating and drinking restrictions Follow instructions from your health care provider about eating and drinking, which may include:  A few days before the procedure - follow a low-fiber diet. Avoid nuts, seeds, dried fruit, raw fruits, and vegetables.  1-3 days before the procedure - follow a clear liquid diet. Drink only clear liquids, such as clear broth or bouillon, black coffee or tea, clear juice, clear soft drinks or sports drinks, gelatin dessert, and popsicles. Avoid any liquids that contain red or purple dye.  On the day of the procedure - do not eat or drink anything during the 2 hours before the procedure, or within the time period that your health care provider recommends.  Bowel prep If you were prescribed an oral bowel prep to clean out your colon:  Take it as told by your  health care provider. Starting the day before your procedure, you will need to drink a large amount of medicated liquid. The liquid will cause you to have multiple loose stools until your stool is almost clear or light green.  If your skin or anus gets irritated from diarrhea, you may use these to relieve the irritation: ? Medicated wipes, such as adult wet wipes with aloe and vitamin E. ? A skin soothing-product like petroleum jelly.  If you vomit while drinking the bowel prep, take a break for up to 60 minutes and then begin the bowel prep again. If vomiting continues and you cannot take the bowel prep without vomiting, call your health care provider.  General instructions  Ask your health care provider about changing or stopping your regular medicines. This is especially important if you are taking diabetes medicines or blood thinners.  Plan to have someone take you home from the hospital or clinic. What happens during the procedure?  An IV tube may be inserted into one of your veins.  You will be given medicine to help you relax (sedative).  To reduce your risk of infection: ? Your health care team will wash or sanitize their hands. ? Your anal area will be washed with soap.  You will be asked to lie on your side with your knees bent.  Your health care provider will lubricate a long, thin, flexible tube. The tube will have a camera and a light on the end.  The tube will be inserted into your anus.  The tube will be gently eased through your rectum and colon.  Air will be delivered into your colon to keep it open. You may feel some pressure or cramping.  The camera will be used to take images during the procedure.  A small tissue sample may be removed from your body to be examined under a microscope (biopsy). If any potential problems are found, the tissue will be sent to a lab for testing.  If small polyps are found, your health care provider may remove them and have them  checked for cancer cells.  The tube that was inserted into your anus will be slowly removed. The procedure may vary among health care providers and hospitals. What happens after the procedure?  Your blood pressure, heart rate, breathing rate, and blood oxygen level will be monitored until the medicines you were  given have worn off.  Do not drive for 24 hours after the exam.  You may have a small amount of blood in your stool.  You may pass gas and have mild abdominal cramping or bloating due to the air that was used to inflate your colon during the exam.  It is up to you to get the results of your procedure. Ask your health care provider, or the department performing the procedure, when your results will be ready. This information is not intended to replace advice given to you by your health care provider. Make sure you discuss any questions you have with your health care provider. Document Released: 07/13/2000 Document Revised: 05/16/2016 Document Reviewed: 09/27/2015 Elsevier Interactive Patient Education  2018 [employer].

## 2018-03-25 ENCOUNTER — Encounter (HOSPITAL_COMMUNITY)
Admission: RE | Admit: 2018-03-25 | Discharge: 2018-03-25 | Disposition: A | Discharge status: Home / Self Care | Payer: Medicare Other | Source: Ambulatory Visit | Attending: Internal Medicine | Admitting: Internal Medicine

## 2018-03-27 ENCOUNTER — Ambulatory Visit (HOSPITAL_COMMUNITY): Payer: Medicare Other | Admitting: Anesthesiology

## 2018-03-27 ENCOUNTER — Ambulatory Visit (HOSPITAL_COMMUNITY)
Admission: RE | Admit: 2018-03-27 | Discharge: 2018-03-27 | Disposition: A | Discharge status: Home / Self Care | Payer: Medicare Other | Source: Ambulatory Visit | Attending: Internal Medicine | Admitting: Internal Medicine

## 2018-03-27 ENCOUNTER — Encounter (HOSPITAL_COMMUNITY)
Admission: RE | Disposition: A | Discharge status: Home / Self Care | Payer: Self-pay | Source: Ambulatory Visit | Attending: Internal Medicine

## 2018-03-27 DIAGNOSIS — R1310 Dysphagia, unspecified: Secondary | ICD-10-CM | POA: Diagnosis not present

## 2018-03-27 DIAGNOSIS — R0989 Other specified symptoms and signs involving the circulatory and respiratory systems: Secondary | ICD-10-CM

## 2018-03-27 DIAGNOSIS — R1314 Dysphagia, pharyngoesophageal phase: Secondary | ICD-10-CM | POA: Diagnosis not present

## 2018-03-27 DIAGNOSIS — R197 Diarrhea, unspecified: Secondary | ICD-10-CM | POA: Diagnosis present

## 2018-03-27 DIAGNOSIS — Z79899 Other long term (current) drug therapy: Secondary | ICD-10-CM | POA: Insufficient documentation

## 2018-03-27 DIAGNOSIS — K5730 Diverticulosis of large intestine without perforation or abscess without bleeding: Secondary | ICD-10-CM | POA: Diagnosis not present

## 2018-03-27 DIAGNOSIS — I480 Paroxysmal atrial fibrillation: Secondary | ICD-10-CM | POA: Insufficient documentation

## 2018-03-27 DIAGNOSIS — Z87891 Personal history of nicotine dependence: Secondary | ICD-10-CM | POA: Diagnosis not present

## 2018-03-27 DIAGNOSIS — D120 Benign neoplasm of cecum: Secondary | ICD-10-CM | POA: Diagnosis not present

## 2018-03-27 DIAGNOSIS — K219 Gastro-esophageal reflux disease without esophagitis: Secondary | ICD-10-CM | POA: Diagnosis not present

## 2018-03-27 DIAGNOSIS — I10 Essential (primary) hypertension: Secondary | ICD-10-CM | POA: Diagnosis not present

## 2018-03-27 DIAGNOSIS — Z7901 Long term (current) use of anticoagulants: Secondary | ICD-10-CM | POA: Diagnosis not present

## 2018-03-27 DIAGNOSIS — K449 Diaphragmatic hernia without obstruction or gangrene: Secondary | ICD-10-CM | POA: Insufficient documentation

## 2018-03-27 DIAGNOSIS — K921 Melena: Secondary | ICD-10-CM

## 2018-03-27 DIAGNOSIS — K222 Esophageal obstruction: Secondary | ICD-10-CM | POA: Diagnosis not present

## 2018-03-27 DIAGNOSIS — E785 Hyperlipidemia, unspecified: Secondary | ICD-10-CM | POA: Diagnosis not present

## 2018-03-27 DIAGNOSIS — K640 First degree hemorrhoids: Secondary | ICD-10-CM | POA: Insufficient documentation

## 2018-03-27 DIAGNOSIS — K625 Hemorrhage of anus and rectum: Secondary | ICD-10-CM

## 2018-03-27 HISTORY — PX: POLYPECTOMY: SHX5525

## 2018-03-27 HISTORY — PX: COLONOSCOPY WITH PROPOFOL: SHX5780

## 2018-03-27 HISTORY — PX: MALONEY DILATION: SHX5535

## 2018-03-27 HISTORY — PX: ESOPHAGOGASTRODUODENOSCOPY (EGD) WITH PROPOFOL: SHX5813

## 2018-03-27 SURGERY — COLONOSCOPY WITH PROPOFOL
Anesthesia: General

## 2018-03-27 MED ORDER — CHLORHEXIDINE GLUCONATE CLOTH 2 % EX PADS: 6.0000 | MEDICATED_PAD | Freq: Once | CUTANEOUS | Status: DC

## 2018-03-27 MED ORDER — LACTATED RINGERS IV SOLN
INTRAVENOUS | Status: DC
  Administered 2018-03-27: 12:00:00 via INTRAVENOUS

## 2018-03-27 MED ORDER — HYDROCODONE-ACETAMINOPHEN 7.5-325 MG PO TABS: 1.0000 | ORAL_TABLET | Freq: Once | ORAL | Status: DC | PRN

## 2018-03-27 MED ORDER — FENTANYL CITRATE (PF) 100 MCG/2ML IJ SOLN: 25.0000 ug | INTRAMUSCULAR | Status: DC | PRN

## 2018-03-27 MED ORDER — PROPOFOL 10 MG/ML IV BOLUS
INTRAVENOUS | Status: DC | PRN
  Administered 2018-03-27: 20 mg via INTRAVENOUS
  Administered 2018-03-27: 40 mg via INTRAVENOUS

## 2018-03-27 MED ORDER — PROPOFOL 500 MG/50ML IV EMUL
INTRAVENOUS | Status: DC | PRN
  Administered 2018-03-27: 100 ug/kg/min via INTRAVENOUS
  Administered 2018-03-27: 150 ug/kg/min via INTRAVENOUS
  Administered 2018-03-27: 125 ug/kg/min via INTRAVENOUS

## 2018-03-27 NOTE — Transfer of Care (Signed)
Immediate Anesthesia Transfer of Care Note  Patient: Daisy Black  Procedure(s) Performed: COLONOSCOPY WITH PROPOFOL (N/A ) ESOPHAGOGASTRODUODENOSCOPY (EGD) WITH PROPOFOL (N/A ) MALONEY DILATION (N/A ) POLYPECTOMY  Patient Location: PACU  Anesthesia Type:General  Level of Consciousness: awake and patient cooperative  Airway & Oxygen Therapy: Patient Spontanous Breathing  Post-op Assessment: Report given to RN and Post -op Vital signs reviewed and stable  Post vital signs: Reviewed and stable  Last Vitals:  Vitals Value Taken Time  BP    Temp    Pulse 66 03/27/2018  2:30 PM  Resp    SpO2 80 % 03/27/2018  2:30 PM  Vitals shown include unvalidated device data.  Last Pain:  Vitals:   03/27/18 1148  TempSrc: Oral  PainSc: 0-No pain      Patients Stated Pain Goal: 5 (16/10/96 0454)  Complications: No apparent anesthesia complications

## 2018-03-27 NOTE — Discharge Instructions (Signed)
EGD Discharge instructions Please read the instructions outlined below and refer to this sheet in the next few weeks. These discharge instructions provide you with general information on caring for yourself after you leave the hospital. Your doctor may also give you specific instructions. While your treatment has been planned according to the most current medical practices available, unavoidable complications occasionally occur. If you have any problems or questions after discharge, please call your doctor. ACTIVITY  You may resume your regular activity but move at a slower pace for the next 24 hours.   Take frequent rest periods for the next 24 hours.   Walking will help expel (get rid of) the air and reduce the bloated feeling in your abdomen.   No driving for 24 hours (because of the anesthesia (medicine) used during the test).   You may shower.   Do not sign any important legal documents or operate any machinery for 24 hours (because of the anesthesia used during the test).  NUTRITION  Drink plenty of fluids.   You may resume your normal diet.   Begin with a light meal and progress to your normal diet.   Avoid alcoholic beverages for 24 hours or as instructed by your caregiver.  MEDICATIONS  You may resume your normal medications unless your caregiver tells you otherwise.  WHAT YOU CAN EXPECT TODAY  You may experience abdominal discomfort such as a feeling of fullness or gas pains.  FOLLOW-UP  Your doctor will discuss the results of your test with you.  SEEK IMMEDIATE MEDICAL ATTENTION IF ANY OF THE FOLLOWING OCCUR:  Excessive nausea (feeling sick to your stomach) and/or vomiting.   Severe abdominal pain and distention (swelling).   Trouble swallowing.   Temperature over 101 F (37.8 C).   Rectal bleeding or vomiting of blood.    Colonoscopy Discharge Instructions  Read the instructions outlined below and refer to this sheet in the next few weeks. These  discharge instructions provide you with general information on caring for yourself after you leave the hospital. Your doctor may also give you specific instructions. While your treatment has been planned according to the most current medical practices available, unavoidable complications occasionally occur. If you have any problems or questions after discharge, call Dr. Gala Romney at [phone]. ACTIVITY  You may resume your regular activity, but move at a slower pace for the next 24 hours.   Take frequent rest periods for the next 24 hours.   Walking will help get rid of the air and reduce the bloated feeling in your belly (abdomen).   No driving for 24 hours (because of the medicine (anesthesia) used during the test).    Do not sign any important legal documents or operate any machinery for 24 hours (because of the anesthesia used during the test).  NUTRITION  Drink plenty of fluids.   You may resume your normal diet as instructed by your doctor.   Begin with a light meal and progress to your normal diet. Heavy or fried foods are harder to digest and may make you feel sick to your stomach (nauseated).   Avoid alcoholic beverages for 24 hours or as instructed.  MEDICATIONS  You may resume your normal medications unless your doctor tells you otherwise.  WHAT YOU CAN EXPECT TODAY  Some feelings of bloating in the abdomen.   Passage of more gas than usual.   Spotting of blood in your stool or on the toilet paper.  IF YOU HAD POLYPS REMOVED DURING THE  COLONOSCOPY:  No aspirin products for 7 days or as instructed.   No alcohol for 7 days or as instructed.   Eat a soft diet for the next 24 hours.  FINDING OUT THE RESULTS OF YOUR TEST Not all test results are available during your visit. If your test results are not back during the visit, make an appointment with your caregiver to find out the results. Do not assume everything is normal if you have not heard from your caregiver or the  medical facility. It is important for you to follow up on all of your test results.  SEEK IMMEDIATE MEDICAL ATTENTION IF:  You have more than a spotting of blood in your stool.   Your belly is swollen (abdominal distention).   You are nauseated or vomiting.   You have a temperature over 101.   You have abdominal pain or discomfort that is severe or gets worse throughout the day.    Hemorrhoids, GERD diverticulosis and colon polyp information provided  Begin Benefiber 1 tablespoon twice daily  Further recommendations to follow pending review of pathology report  Resume Eliquis tomorrow   PATIENT INSTRUCTIONS POST-ANESTHESIA  IMMEDIATELY FOLLOWING SURGERY:  Do not drive or operate machinery for the first twenty four hours after surgery.  Do not make any important decisions for twenty four hours after surgery or while taking narcotic pain medications or sedatives.  If you develop intractable nausea and vomiting or a severe headache please notify your doctor immediately.  FOLLOW-UP:  Please make an appointment with your surgeon as instructed. You do not need to follow up with anesthesia unless specifically instructed to do so.  WOUND CARE INSTRUCTIONS (if applicable):  Keep a dry clean dressing on the anesthesia/puncture wound site if there is drainage.  Once the wound has quit draining you may leave it open to air.  Generally you should leave the bandage intact for twenty four hours unless there is drainage.  If the epidural site drains for more than 36-48 hours please call the anesthesia department.  QUESTIONS?:  Please feel free to call your physician or the hospital operator if you have any questions, and they will be happy to assist you.       Hemorrhoids Hemorrhoids are swollen veins in and around the rectum or anus. Hemorrhoids can cause pain, itching, or bleeding. Most of the time, they do not cause serious problems. They usually get better with diet changes, lifestyle  changes, and other home treatments. Follow these instructions at home: Eating and drinking  Eat foods that have fiber, such as whole grains, beans, nuts, fruits, and vegetables. Ask your doctor about taking products that have added fiber (fibersupplements).  Drink enough fluid to keep your pee (urine) clear or pale yellow. For Pain and Swelling  Take a warm-water bath (sitz bath) for 20 minutes to ease pain. Do this 3-4 times a day.  If directed, put ice on the painful area. It may be helpful to use ice between your warm baths. ? Put ice in a plastic bag. ? Place a towel between your skin and the bag. ? Leave the ice on for 20 minutes, 2-3 times a day. General instructions  Take over-the-counter and prescription medicines only as told by your doctor. ? Medicated creams and medicines that are inserted into the anus (suppositories) may be used or applied as told.  Exercise often.  Go to the bathroom when you have the urge to poop (to have a bowel movement). Do not wait.  Avoid pushing too hard (straining) when you poop.  Keep the butt area dry and clean. Use wet toilet paper or moist paper towels.  Do not sit on the toilet for a long time. Contact a doctor if:  You have any of these: ? Pain and swelling that do not get better with treatment or medicine. ? Bleeding that will not stop. ? Trouble pooping or you cannot poop. ? Pain or swelling outside the area of the hemorrhoids. This information is not intended to replace advice given to you by your health care provider. Make sure you discuss any questions you have with your health care provider. Document Released: 04/24/2008 Document Revised: 12/22/2015 Document Reviewed: 03/30/2015 Elsevier Interactive Patient Education  2018 [employer].   Gastroesophageal Reflux Disease, Adult Normally, food travels down the esophagus and stays in the stomach to be digested. If a person has gastroesophageal reflux disease (GERD), food and  stomach acid move back up into the esophagus. When this happens, the esophagus becomes sore and swollen (inflamed). Over time, GERD can make small holes (ulcers) in the lining of the esophagus. Follow these instructions at home: Diet  Follow a diet as told by your doctor. You may need to avoid foods and drinks such as: ? Coffee and tea (with or without caffeine). ? Drinks that contain alcohol. ? Energy drinks and sports drinks. ? Carbonated drinks or sodas. ? Chocolate and cocoa. ? Peppermint and mint flavorings. ? Garlic and onions. ? Horseradish. ? Spicy and acidic foods, such as peppers, chili powder, curry powder, vinegar, hot sauces, and BBQ sauce. ? Citrus fruit juices and citrus fruits, such as oranges, lemons, and limes. ? Tomato-based foods, such as red sauce, chili, salsa, and pizza with red sauce. ? Fried and fatty foods, such as donuts, french fries, potato chips, and high-fat dressings. ? High-fat meats, such as hot dogs, rib eye steak, sausage, ham, and bacon. ? High-fat dairy items, such as whole milk, butter, and cream cheese.  Eat small meals often. Avoid eating large meals.  Avoid drinking large amounts of liquid with your meals.  Avoid eating meals during the 2-3 hours before bedtime.  Avoid lying down right after you eat.  Do not exercise right after you eat. General instructions  Pay attention to any changes in your symptoms.  Take over-the-counter and prescription medicines only as told by your doctor. Do not take aspirin, ibuprofen, or other NSAIDs unless your doctor says it is okay.  Do not use any tobacco products, including cigarettes, chewing tobacco, and e-cigarettes. If you need help quitting, ask your doctor.  Wear loose clothes. Do not wear anything tight around your waist.  Raise (elevate) the head of your bed about 6 inches (15 cm).  Try to lower your stress. If you need help doing this, ask your doctor.  If you are overweight, lose an  amount of weight that is healthy for you. Ask your doctor about a safe weight loss goal.  Keep all follow-up visits as told by your doctor. This is important. Contact a doctor if:  You have new symptoms.  You lose weight and you do not know why it is happening.  You have trouble swallowing, or it hurts to swallow.  You have wheezing or a cough that keeps happening.  Your symptoms do not get better with treatment.  You have a hoarse voice. Get help right away if:  You have pain in your arms, neck, jaw, teeth, or back.  You feel sweaty,  dizzy, or light-headed.  You have chest pain or shortness of breath.  You throw up (vomit) and your throw up looks like blood or coffee grounds.  You pass out (faint).  Your poop (stool) is bloody or black.  You cannot swallow, drink, or eat. This information is not intended to replace advice given to you by your health care provider. Make sure you discuss any questions you have with your health care provider. Document Released: 01/02/2008 Document Revised: 12/22/2015 Document Reviewed: 11/10/2014 Elsevier Interactive Patient Education  2018 [employer].    Colon Polyps Polyps are tissue growths inside the body. Polyps can grow in many places, including the large intestine (colon). A polyp may be a round bump or a mushroom-shaped growth. You could have one polyp or several. Most colon polyps are noncancerous (benign). However, some colon polyps can become cancerous over time. What are the causes? The exact cause of colon polyps is not known. What increases the risk? This condition is more likely to develop in people who:  Have a family history of colon cancer or colon polyps.  Are older than 50 or older than 45 if they are African American.  Have inflammatory bowel disease, such as ulcerative colitis or Crohn disease.  Are overweight.  Smoke cigarettes.  Do not get enough exercise.  Drink too much alcohol.  Eat a diet that  is: ? High in fat and red meat. ? Low in fiber.  Had childhood cancer that was treated with abdominal radiation.  What are the signs or symptoms? Most polyps do not cause symptoms. If you have symptoms, they may include:  Blood coming from your rectum when having a bowel movement.  Blood in your stool.The stool may look dark red or black.  A change in bowel habits, such as constipation or diarrhea.  How is this diagnosed? This condition is diagnosed with a colonoscopy. This is a procedure that uses a lighted, flexible scope to look at the inside of your colon. How is this treated? Treatment for this condition involves removing any polyps that are found. Those polyps will then be tested for cancer. If cancer is found, your health care provider will talk to you about options for colon cancer treatment. Follow these instructions at home: Diet  Eat plenty of fiber, such as fruits, vegetables, and whole grains.  Eat foods that are high in calcium and vitamin D, such as milk, cheese, yogurt, eggs, liver, fish, and broccoli.  Limit foods high in fat, red meats, and processed meats, such as hot dogs, sausage, bacon, and lunch meats.  Maintain a healthy weight, or lose weight if recommended by your health care provider. General instructions  Do not smoke cigarettes.  Do not drink alcohol excessively.  Keep all follow-up visits as told by your health care provider. This is important. This includes keeping regularly scheduled colonoscopies. Talk to your health care provider about when you need a colonoscopy.  Exercise every day or as told by your health care provider. Contact a health care provider if:  You have new or worsening bleeding during a bowel movement.  You have new or increased blood in your stool.  You have a change in bowel habits.  You unexpectedly lose weight. This information is not intended to replace advice given to you by your health care provider. Make sure  you discuss any questions you have with your health care provider. Document Released: 04/11/2004 Document Revised: 12/22/2015 Document Reviewed: 06/06/2015 Elsevier Interactive Patient Education  2018  [employer]    Diverticulosis Diverticulosis is a condition that develops when small pouches (diverticula) form in the wall of the large intestine (colon). The colon is where water is absorbed and stool is formed. The pouches form when the inside layer of the colon pushes through weak spots in the outer layers of the colon. You may have a few pouches or many of them. What are the causes? The cause of this condition is not known. What increases the risk? The following factors may make you more likely to develop this condition:  Being older than age 15. Your risk for this condition increases with age. Diverticulosis is rare among people younger than age 76. By age 82, many people have it.  Eating a low-fiber diet.  Having frequent constipation.  Being overweight.  Not getting enough exercise.  Smoking.  Taking over-the-counter pain medicines, like aspirin and ibuprofen.  Having a family history of diverticulosis.  What are the signs or symptoms? In most people, there are no symptoms of this condition. If you do have symptoms, they may include:  Bloating.  Cramps in the abdomen.  Constipation or diarrhea.  Pain in the lower left side of the abdomen.  How is this diagnosed? This condition is most often diagnosed during an exam for other colon problems. Because diverticulosis usually has no symptoms, it often cannot be diagnosed independently. This condition may be diagnosed by:  Using a flexible scope to examine the colon (colonoscopy).  Taking an X-ray of the colon after dye has been put into the colon (barium enema).  Doing a CT scan.  How is this treated? You may not need treatment for this condition if you have never developed an infection related to diverticulosis.  If you have had an infection before, treatment may include:  Eating a high-fiber diet. This may include eating more fruits, vegetables, and grains.  Taking a fiber supplement.  Taking a live bacteria supplement (probiotic).  Taking medicine to relax your colon.  Taking antibiotic medicines.  Follow these instructions at home:  Drink 6-8 glasses of water or more each day to prevent constipation.  Try not to strain when you have a bowel movement.  If you have had an infection before: ? Eat more fiber as directed by your health care provider or your diet and nutrition specialist (dietitian). ? Take a fiber supplement or probiotic, if your health care provider approves.  Take over-the-counter and prescription medicines only as told by your health care provider.  If you were prescribed an antibiotic, take it as told by your health care provider. Do not stop taking the antibiotic even if you start to feel better.  Keep all follow-up visits as told by your health care provider. This is important. Contact a health care provider if:  You have pain in your abdomen.  You have bloating.  You have cramps.  You have not had a bowel movement in 3 days. Get help right away if:  Your pain gets worse.  Your bloating becomes very bad.  You have a fever or chills, and your symptoms suddenly get worse.  You vomit.  You have bowel movements that are bloody or black.  You have bleeding from your rectum. Summary  Diverticulosis is a condition that develops when small pouches (diverticula) form in the wall of the large intestine (colon).  You may have a few pouches or many of them.  This condition is most often diagnosed during an exam for other colon problems.  If you have had an infection related to diverticulosis, treatment may include increasing the fiber in your diet, taking supplements, or taking medicines. This information is not intended to replace advice given to you by your  health care provider. Make sure you discuss any questions you have with your health care provider. Document Released: 04/12/2004 Document Revised: 06/04/2016 Document Reviewed: 06/04/2016 Elsevier Interactive Patient Education  2017 [employer].

## 2018-03-27 NOTE — Anesthesia Procedure Notes (Signed)
Procedure Name: MAC Date/Time: 03/27/2018 1:45 PM Performed by: Vista Deck, CRNA Pre-anesthesia Checklist: Patient identified, Emergency Drugs available, Suction available, Timeout performed and Patient being monitored Patient Re-evaluated:Patient Re-evaluated prior to induction Oxygen Delivery Method: Non-rebreather mask

## 2018-03-27 NOTE — Anesthesia Postprocedure Evaluation (Signed)
Anesthesia Post Note  Patient: Daisy Black  Procedure(s) Performed: COLONOSCOPY WITH PROPOFOL (N/A ) ESOPHAGOGASTRODUODENOSCOPY (EGD) WITH PROPOFOL (N/A ) MALONEY DILATION (N/A ) POLYPECTOMY  Patient location during evaluation: PACU Anesthesia Type: General Level of consciousness: awake and alert and patient cooperative Pain management: satisfactory to patient Vital Signs Assessment: post-procedure vital signs reviewed and stable Respiratory status: spontaneous breathing Cardiovascular status: stable Postop Assessment: no apparent nausea or vomiting Anesthetic complications: no     Last Vitals:  Vitals:   03/27/18 1430 03/27/18 1445  BP: 105/82 (!) 108/49  Pulse: 66 65  Resp: 14 18  Temp: 36.6 C   SpO2: 93% 92%    Last Pain:  Vitals:   03/27/18 1445  TempSrc:   PainSc: (P) 0-No pain                 Rhealyn Cullen

## 2018-03-27 NOTE — H&P (Signed)
@LOGO @   Primary Care Physician:  Renee Rival, NP Primary Gastroenterologist:  Dr. Gala Romney  Pre-Procedure History & Physical: HPI:  Daisy Black is a 67 y.o. female here for further evaluation of GERD/epigastric pain and vague esophageal dysphagia. Also, intermittent rectal bleeding. Here for colonoscopy. Eliquis was held x 2 days ago. No prior EGD or colonoscopy.  Past Medical History:  Diagnosis Date  . Anemia   . Arthritis   . CLL (chronic lymphocytic leukemia) ([place])   . Essential hypertension   . GERD (gastroesophageal reflux disease)   . History of hiatal hernia   . Hyperlipidemia   . Paroxysmal atrial fibrillation ([facility])   . Splenomegaly     Past Surgical History:  Procedure Laterality Date  . ANKLE SURGERY Right    repair fracture  . CARDIOVERSION N/A 03/20/2016   Procedure: CARDIOVERSION;  Surgeon: Satira Sark, MD;  Location: [facility];  Service: Cardiovascular;  Laterality: N/A;  . CARDIOVERSION N/A 05/22/2016   Procedure: CARDIOVERSION;  Surgeon: Satira Sark, MD;  Location: [facility];  Service: Cardiovascular;  Laterality: N/A;  . CHOLECYSTECTOMY    . HERNIA REPAIR  2011   Incisional and umbilical utilizing mesh  . ROTATOR CUFF REPAIR Left   . TEE WITHOUT CARDIOVERSION N/A 01/17/2016   Procedure: TRANSESOPHAGEAL ECHOCARDIOGRAM (TEE);  Surgeon: Herminio Commons, MD;  Location: [facility];  Service: Cardiovascular;  Laterality: N/A;  . TEE WITHOUT CARDIOVERSION N/A 02/21/2016   Procedure: TRANSESOPHAGEAL ECHOCARDIOGRAM (TEE) WITH PROPOFOL;  Surgeon: Herminio Commons, MD;  Location: [facility];  Service: Cardiovascular;  Laterality: N/A;  . TEE WITHOUT CARDIOVERSION N/A 03/20/2016   Procedure: TRANSESOPHAGEAL ECHOCARDIOGRAM (TEE) WITH PROPOFOL;  Surgeon: Satira Sark, MD;  Location: [facility];  Service: Cardiovascular;  Laterality: N/A;  . TONSILLECTOMY      Prior to Admission medications   Medication Sig Start Date End Date Taking? Authorizing Provider   acetaminophen (TYLENOL) 650 MG CR tablet Take 650-1,300 mg by mouth daily as needed for pain.    Yes [provider]  apixaban (ELIQUIS) 5 MG TABS tablet Take 1 tablet (5 mg total) by mouth 2 (two) times daily. 01/31/18  Yes Satira Sark, MD  cetirizine (ZYRTEC) 10 MG tablet Take 10 mg by mouth daily as needed for allergies.   Yes [provider]  Ferrous Gluconate-C-Folic Acid (IRON-C PO) Take 1 tablet by mouth daily.   Yes [provider]  flecainide (TAMBOCOR) 50 MG tablet Take 1.5 tablets (75 mg total) by mouth 2 (two) times daily. Patient taking differently: Take 50 mg by mouth 2 (two) times daily.  02/18/18  Yes Satira Sark, MD  fluticasone [facility]) 50 MCG/ACT nasal spray Place 1 spray into both nostrils daily as needed for allergies or rhinitis.   Yes [provider]  furosemide (LASIX) 20 MG tablet Take 1 tablet (20 mg total) by mouth daily as needed. Patient taking differently: Take 20 mg by mouth daily as needed for fluid.  01/09/18  Yes Satira Sark, MD  hydrocortisone (ANUSOL-HC) 2.5 % rectal cream Apply 1 application topically daily as needed for itching. 03/13/18  Yes [provider]  levothyroxine (SYNTHROID, LEVOTHROID) 125 MCG tablet Take 125 mcg by mouth daily before breakfast.   Yes [provider]  metoprolol tartrate (LOPRESSOR) 25 MG tablet Take 1 tablet (25 mg total) by mouth 2 (two) times daily. 01/31/18  Yes Satira Sark, MD  Multiple Vitamin (MULTIVITAMIN WITH MINERALS) TABS tablet Take 1 tablet  by mouth daily.   Yes [provider]  nystatin cream (MYCOSTATIN) Apply 1 application topically 2 (two) times daily. Patient taking differently: Apply 1 application topically daily as needed (rash).  02/06/18  Yes Annitta Needs, NP  pantoprazole (PROTONIX) 40 MG tablet TAKE ONE TABLET BY MOUTH TWO TIMES A DAY WITH FOOD Patient taking differently: Take 40 mg by mouth 2 (two) times daily before a meal.   01/24/18  Yes Mahala Menghini, PA-C    Allergies as of 01/06/2018  . (No Known Allergies)    Family History  Problem Relation Age of Onset  . Ovarian cancer Mother 64       Secondary to ovarian cancer  . Leukemia Mother   . Stroke Father 56       Brain stem infarction  . Breast cancer Paternal Aunt   . Crohn's disease Son   . Colon cancer Neg Hx     Social History   Socioeconomic History  . Marital status: Married    Spouse name: Not on file  . Number of children: 59  . Years of education: Not on file  . Highest education level: Not on file  Occupational History  . Not on file  Social Needs  . Financial resource strain: Not on file  . Food insecurity:    Worry: Not on file    Inability: Not on file  . Transportation needs:    Medical: Not on file    Non-medical: Not on file  Tobacco Use  . Smoking status: Former Smoker    Packs/day: 0.25    Years: 2.00    Pack years: 0.50    Types: Cigarettes    Last attempt to quit: 08/03/1975    Years since quitting: 42.6  . Smokeless tobacco: Never Used  Substance and Sexual Activity  . Alcohol use: No    Alcohol/week: 0.0 standard drinks  . Drug use: No  . Sexual activity: Yes    Birth control/protection: Post-menopausal  Lifestyle  . Physical activity:    Days per week: Not on file    Minutes per session: Not on file  . Stress: Not on file  Relationships  . Social connections:    Talks on phone: Not on file    Gets together: Not on file    Attends religious service: Not on file    Active member of club or organization: Not on file    Attends meetings of clubs or organizations: Not on file    Relationship status: Not on file  . Intimate partner violence:    Fear of current or ex partner: Not on file    Emotionally abused: Not on file    Physically abused: Not on file    Forced sexual activity: Not on file  Other Topics Concern  . Not on file  Social History Narrative  . Not on file    Review of Systems: See  HPI, otherwise negative ROS  Physical Exam: BP 130/72   Pulse 77   Temp 98.3 F (36.8 C) (Oral)   Resp 18   SpO2 92%  Neck:  Supple; no masses or thyromegaly. No significant cervical adenopathy. Lungs:  Clear throughout to auscultation.   No wheezes, crackles, or rhonchi. No acute distress. Heart:  Regular rate and rhythm; no murmurs, clicks, rubs,  or gallops. Abdomen: Non-distended, normal bowel sounds.  Soft and nontender without appreciable mass or hepatosplenomegaly.  Pulses:  Normal pulses noted. Extremities:  Without clubbing or edema.  Impression/Plan:  67 year old lady with GERD/epigastric pain/vague esophageal dysphagia and rectal bleeding.  Plan for EGD with possibe esophageal dilation and colonoscopy per plan.  The risks, benefits, limitations, imponderables and alternatives regarding both EGD and colonoscopy have been reviewed with the patient. Questions have been answered. All parties agreeable.     Notice: This dictation was prepared with Dragon dictation along with smaller phrase technology. Any transcriptional errors that result from this process are unintentional and may not be corrected upon review.

## 2018-03-27 NOTE — Op Note (Signed)
[facility] Patient Name: Daisy Black Procedure Date: 03/27/2018 1:19 PM MRN: [id number] Date of Birth: 1951-01-28 Attending MD: Norvel Richards , MD CSN: [id number] Age: 67 Admit Type: Outpatient Procedure:                Upper GI endoscopy Indications:              Dysphagia Providers:                Norvel Richards, MD, Jeanann Lewandowsky. Sharon Seller, RN,                            Aram Candela Referring MD:              Medicines:                Monitored Anesthesia Care Complications:            No immediate complications. Estimated Blood Loss:     Estimated blood loss was minimal. Procedure:                Pre-Anesthesia Assessment:                           - Prior to the procedure, a History and Physical                            was performed, and patient medications and                            allergies were reviewed. The patient's tolerance of                            previous anesthesia was also reviewed. The risks                            and benefits of the procedure and the sedation                            options and risks were discussed with the patient.                            All questions were answered, and informed consent                            was obtained. Prior Anticoagulants: The patient has                            taken no previous anticoagulant or antiplatelet                            agents. ASA Grade Assessment: II - A patient with                            mild systemic disease. After reviewing the risks  and benefits, the patient was deemed in                            satisfactory condition to undergo the procedure.                           After obtaining informed consent, the endoscope was                            passed under direct vision. Throughout the                            procedure, the patient's blood pressure, pulse, and                            oxygen saturations were  monitored continuously. The                            [id number]-[id number] ([id number]) scope was introduced through the                            and advanced to the second part of duodenum. Scope In: 1:45:49 PM Scope Out: 2:01:54 PM Total Procedure Duration: 0 hours 16 minutes 5 seconds  Findings:      A moderate Schatzki ring was found at the gastroesophageal junction. The       scope was withdrawn. Dilation was performed with a Maloney dilator with       mild resistance at 48 Fr. The dilation site was examined following       endoscope reinsertion and showed mild mucosal disruption. Estimated       blood loss was minimal. I attempted to pass a 56 but could not get it       beyond the hypopharynx. Subsequently, I reintroduced the scope and took       4 qwuadrant bites of the ring to disrupt it. this was done without       difficulty or pericolic complication.      A small hiatal hernia was present.      The exam was otherwise without abnormality.      The duodenal bulb and second portion of the duodenum were normal. Impression:               - Moderate Schatzki ring. Dilated/ disrupted as                            described above..                           - Small hiatal hernia.                           - The examination was otherwise normal.                           - Normal duodenal bulb and second portion of the  duodenum.                           - No specimens collected. Moderate Sedation:      Moderate (conscious) sedation was personally administered by an       anesthesia professional. The following parameters were monitored: oxygen       saturation, heart rate, blood pressure, respiratory rate, EKG, adequacy       of pulmonary ventilation, and response to care. Total physician       intraservice time was 22 minutes. Recommendation:           - Patient has a contact number available for                            emergencies. The signs and symptoms of  potential                            delayed complications were discussed with the                            patient. Return to normal activities tomorrow.                            Written discharge instructions were provided to the                            patient.                           - Resume previous diet.                           - Continue present medications.                           - No repeat upper endoscopy.                           - Return to GI office (date not yet determined).                            See colonoscopy report Procedure Code(s):        --- Professional ---                           [phone], Esophagogastroduodenoscopy, flexible,                            transoral; diagnostic, including collection of                            specimen(s) by brushing or washing, when performed                            (separate procedure)  43450, Dilation of esophagus, by unguided sound or                            bougie, single or multiple passes Diagnosis Code(s):        --- Professional ---                           K22.2, Esophageal obstruction                           K44.9, Diaphragmatic hernia without obstruction or                            gangrene                           R13.10, Dysphagia, unspecified CPT copyright 2017 American Medical Association. All rights reserved. The codes documented in this report are preliminary and upon coder review may  be revised to meet current compliance requirements. Daisy Black. Daisy Leisner, MD Norvel Richards, MD 03/27/2018 2:26:01 PM This report has been signed electronically. Number of Addenda: 0

## 2018-03-27 NOTE — Anesthesia Preprocedure Evaluation (Signed)
Anesthesia Evaluation  Patient identified by MRN, date of birth, ID band Patient awake    Reviewed: Allergy & Precautions, NPO status , Patient's Chart, lab work & pertinent test results, reviewed documented beta blocker date and time   Airway Mallampati: II  TM Distance: >3 FB Neck ROM: Full    Dental no notable dental hx.    Pulmonary neg pulmonary ROS, former smoker,    Pulmonary exam normal breath sounds clear to auscultation       Cardiovascular Exercise Tolerance: Good hypertension, Pt. on medications and Pt. on home beta blockers negative cardio ROS Normal cardiovascular exam+ dysrhythmias Atrial Fibrillation I Rhythm:Regular Rate:Normal  H/o Afib - last CV in 2017  Stays on eliquis, flips in and out of afib  Denies recent CP or MI   Neuro/Psych negative neurological ROS  negative psych ROS   GI/Hepatic negative GI ROS, Neg liver ROS, hiatal hernia, GERD  Medicated and Controlled,  Endo/Other  negative endocrine ROS  Renal/GU negative Renal ROS  negative genitourinary   Musculoskeletal negative musculoskeletal ROS (+) Arthritis , Osteoarthritis,    Abdominal   Peds negative pediatric ROS (+)  Hematology negative hematology ROS (+) anemia ,   Anesthesia Other Findings   Reproductive/Obstetrics negative OB ROS                             Anesthesia Physical Anesthesia Plan  ASA: III  Anesthesia Plan: General   Post-op Pain Management:    Induction: Intravenous  PONV Risk Score and Plan:   Airway Management Planned: Simple Face Mask and Nasal Cannula  Additional Equipment:   Intra-op Plan:   Post-operative Plan:   Informed Consent: I have reviewed the patients History and Physical, chart, labs and discussed the procedure including the risks, benefits and alternatives for the proposed anesthesia with the patient or authorized representative who has indicated his/her  understanding and acceptance.   Dental advisory given  Plan Discussed with: CRNA  Anesthesia Plan Comments:         Anesthesia Quick Evaluation

## 2018-03-27 NOTE — Op Note (Signed)
[facility] Patient Name: Daisy Black Procedure Date: 03/27/2018 2:07 PM MRN: [id number] Date of Birth: 09-25-1950 Attending MD: Norvel Richards , MD CSN: [id number] Age: 67 Admit Type: Outpatient Procedure:                Colonoscopy Indications:              Hematochezia Providers:                Norvel Richards, MD, Jeanann Lewandowsky. Sharon Seller, RN,                            Nelma Rothman, Technician Referring MD:              Medicines:                Propofol per Anesthesia Complications:            No immediate complications. Estimated Blood Loss:     Estimated blood loss was minimal. Procedure:                Pre-Anesthesia Assessment:                           - Prior to the procedure, a History and Physical                            was performed, and patient medications and                            allergies were reviewed. The patient's tolerance of                            previous anesthesia was also reviewed. The risks                            and benefits of the procedure and the sedation                            options and risks were discussed with the patient.                            All questions were answered, and informed consent                            was obtained. Prior Anticoagulants: The patient has                            taken no previous anticoagulant or antiplatelet                            agents. ASA Grade Assessment: II - A patient with                            mild systemic disease. After reviewing the risks  and benefits, the patient was deemed in                            satisfactory condition to undergo the procedure.                           After obtaining informed consent, the colonoscope                            was passed under direct vision. Throughout the                            procedure, the patient's blood pressure, pulse, and                            oxygen saturations were  monitored continuously. The                            [id number]-[id number] ([id number]) scope was introduced through                            the anus and advanced to the the cecum, identified                            by appendiceal orifice and ileocecal valve. Scope In: 2:08:19 PM Scope Out: 2:22:19 PM Scope Withdrawal Time: 0 hours 11 minutes 36 seconds  Total Procedure Duration: 0 hours 14 minutes 0 seconds  Findings:      The perianal and digital rectal examinations were normal.      Internal hemorrhoids were found during retroflexion. The hemorrhoids       were Grade I (internal hemorrhoids that do not prolapse).      Scattered small and large-mouthed diverticula were found in the sigmoid       colon and descending colon.      Three sessile polyps were found in the ileocecal valve. The polyps were       4 to 6 mm in size. These polyps were removed with a cold snare.       Resection and retrieval were complete. Estimated blood loss was minimal.      The exam was otherwise without abnormality on direct and retroflexion       views. Impression:               - Internal hemorrhoids.                           - Diverticulosis in the sigmoid colon and in the                            descending colon.                           - Three 4 to 6 mm polyps at the ileocecal valve,                            removed with a cold snare. Resected and retrieved.                           -  The examination was otherwise normal on direct                            and retroflexion views. Moderate Sedation:      Moderate (conscious) sedation was personally administered by an       anesthesia professional. The following parameters were monitored: oxygen       saturation, heart rate, blood pressure, respiratory rate, EKG, adequacy       of pulmonary ventilation, and response to care. Total physician       intraservice time was 43 minutes. Recommendation:           - Patient has a contact number available for                             emergencies. The signs and symptoms of potential                            delayed complications were discussed with the                            patient. Return to normal activities tomorrow.                            Written discharge instructions were provided to the                            patient.                           - Resume previous diet.                           - Continue present medications.                           - Repeat colonoscopy date to be determined after                            pending pathology results are reviewed for                            surveillance based on pathology results.                           - Return to GI office in 3 months. Hemorrhoid                            information provided. Begin Benefiber 1 tablespoon                            twice daily. See EGD report. Procedure Code(s):        --- Professional ---                           [phone], Colonoscopy, flexible; with removal of  tumor(s), polyp(s), or other lesion(s) by snare                            technique Diagnosis Code(s):        --- Professional ---                           K64.0, First degree hemorrhoids                           D12.0, Benign neoplasm of cecum                           K92.1, Melena (includes Hematochezia)                           K57.30, Diverticulosis of large intestine without                            perforation or abscess without bleeding CPT copyright 2017 American Medical Association. All rights reserved. The codes documented in this report are preliminary and upon coder review may  be revised to meet current compliance requirements. Cristopher Estimable. Rourk, MD Norvel Richards, MD 03/27/2018 2:29:38 PM This report has been signed electronically. Number of Addenda: 0

## 2018-03-27 NOTE — Anesthesia Procedure Notes (Signed)
Procedure Name: MAC Date/Time: 03/27/2018 1:36 PM Performed by: Vista Deck, CRNA Pre-anesthesia Checklist: Patient identified, Emergency Drugs available, Suction available, Timeout performed and Patient being monitored Patient Re-evaluated:Patient Re-evaluated prior to induction Oxygen Delivery Method: Nasal Cannula

## 2018-04-02 ENCOUNTER — Encounter: Payer: Self-pay | Admitting: Internal Medicine

## 2018-04-03 ENCOUNTER — Encounter (HOSPITAL_COMMUNITY): Payer: Self-pay | Admitting: Internal Medicine

## 2018-04-23 ENCOUNTER — Ambulatory Visit (INDEPENDENT_AMBULATORY_CARE_PROVIDER_SITE_OTHER): Payer: Medicare Other | Admitting: Gastroenterology

## 2018-04-23 ENCOUNTER — Encounter: Payer: Self-pay | Admitting: Gastroenterology

## 2018-04-23 VITALS — BP 136/70 | HR 86 | Temp 97.0°F | Ht 67.0 in | Wt 258.4 lb

## 2018-04-23 DIAGNOSIS — K625 Hemorrhage of anus and rectum: Secondary | ICD-10-CM | POA: Diagnosis not present

## 2018-04-23 MED ORDER — HYDROCORTISONE 2.5 % RE CREA: 1.0000 "application " | TOPICAL_CREAM | Freq: Two times a day (BID) | RECTAL | 1 refills | Status: DC

## 2018-04-23 NOTE — Patient Instructions (Addendum)
Decrease Protonix to once a day. If you have worsening reflux, you can increase to twice a day.   I sent in the suppositories to [facility]. They will call you when it is ready.  We will see you in 6 months!  I enjoyed seeing you again today! As you know, I value our relationship and want to provide genuine, compassionate, and quality care. I welcome your feedback. If you receive a survey regarding your visit,  I greatly appreciate you taking time to fill this out. See you next time!  Annitta Needs, PhD, ANP-BC [facility]

## 2018-04-23 NOTE — Progress Notes (Signed)
Referring Provider: Renee Rival, NP Primary Care Physician:  Renee Rival, NP Primary GI: Dr. Gala Romney   Chief Complaint  Patient presents with  . Hemorrhoids    bleeding    HPI:   Daisy Black is a 67 y.o. female presenting today with a history of CLL, followed by [facility]. She is on Eliquis with history of afib. Recent colonoscopy completed with internal hemorrhoids; she will need surveillance colonoscopy in 5 years. EGD recently completed with moderate Schatzki's ring s/p dilation.   No constipation. BM every day. Sometimes may go more than once. Blood with wiping. Sometimes has rectal discomfort with walking but not often. Some days not as bad as other day. Has Tucks wipes she uses. Anusol cream helps. She would like suppositories, as she does not like to waste the remnants in the tip after each insertion. Willing to pay out of pocket.      Past Medical History:  Diagnosis Date  . Anemia   . Arthritis   . CLL (chronic lymphocytic leukemia) ([place])   . Essential hypertension   . GERD (gastroesophageal reflux disease)   . History of hiatal hernia   . Hyperlipidemia   . Paroxysmal atrial fibrillation (HCC)   . Splenomegaly     Past Surgical History:  Procedure Laterality Date  . ANKLE SURGERY Right    repair fracture  . CARDIOVERSION N/A 03/20/2016   Procedure: CARDIOVERSION;  Surgeon: Satira Sark, MD;  Location: [facility];  Service: Cardiovascular;  Laterality: N/A;  . CARDIOVERSION N/A 05/22/2016   Procedure: CARDIOVERSION;  Surgeon: Satira Sark, MD;  Location: [facility];  Service: Cardiovascular;  Laterality: N/A;  . CHOLECYSTECTOMY    . COLONOSCOPY WITH PROPOFOL N/A 03/27/2018   Internal hemorrhoids, diverticulosis in sigmoid and descending colon, three 4-6 mm polyps at IC valve. tubular adenomas 5 year surveillance  . ESOPHAGOGASTRODUODENOSCOPY (EGD) WITH PROPOFOL N/A 03/27/2018   Moderate Schatzki's ring s/p dilation,  small hiatal hernia  . HERNIA REPAIR  2011   Incisional and umbilical utilizing mesh  . MALONEY DILATION N/A 03/27/2018   Procedure: Venia Minks DILATION;  Surgeon: Daneil Dolin, MD;  [facility]: [facility];  Service: Endoscopy;  Laterality: N/A;  . POLYPECTOMY  03/27/2018   Procedure: POLYPECTOMY;  Surgeon: Daneil Dolin, MD;  [facility];  Service: Endoscopy;;  colon  . ROTATOR CUFF REPAIR Left   . TEE WITHOUT CARDIOVERSION N/A 01/17/2016   Procedure: TRANSESOPHAGEAL ECHOCARDIOGRAM (TEE);  Surgeon: Herminio Commons, MD;  Location: [facility];  Service: Cardiovascular;  Laterality: N/A;  . TEE WITHOUT CARDIOVERSION N/A 02/21/2016   Procedure: TRANSESOPHAGEAL ECHOCARDIOGRAM (TEE) WITH PROPOFOL;  Surgeon: Herminio Commons, MD;  Location: [facility];  Service: Cardiovascular;  Laterality: N/A;  . TEE WITHOUT CARDIOVERSION N/A 03/20/2016   Procedure: TRANSESOPHAGEAL ECHOCARDIOGRAM (TEE) WITH PROPOFOL;  Surgeon: Satira Sark, MD;  Location: [facility];  Service: Cardiovascular;  Laterality: N/A;  . TONSILLECTOMY      Current Outpatient Medications  Medication Sig Dispense Refill  . acetaminophen (TYLENOL) 650 MG CR tablet Take 650-1,300 mg by mouth daily as needed for pain.     Marland Kitchen apixaban (ELIQUIS) 5 MG TABS tablet Take 1 tablet (5 mg total) by mouth 2 (two) times daily. 180 tablet 3  . cetirizine (ZYRTEC) 10 MG tablet Take 10 mg by mouth daily as needed for allergies.    . Ferrous Gluconate-C-Folic Acid (IRON-C PO) Take 1 tablet by mouth  daily.    . flecainide (TAMBOCOR) 50 MG tablet Take 1.5 tablets (75 mg total) by mouth 2 (two) times daily. (Patient taking differently: Take 50 mg by mouth 2 (two) times daily. ) 270 tablet 3  . fluticasone (FLONASE) 50 MCG/ACT nasal spray Place 1 spray into both nostrils daily as needed for allergies or rhinitis.    . furosemide (LASIX) 20 MG tablet Take 1 tablet (20 mg total) by mouth daily as needed. (Patient taking differently: Take 20 mg by mouth  daily as needed for fluid. ) 30 tablet 6  . hydrocortisone (ANUSOL-HC) 2.5 % rectal cream Apply 1 application topically daily as needed for itching.  1  . levothyroxine (SYNTHROID, LEVOTHROID) 125 MCG tablet Take 125 mcg by mouth daily before breakfast.    . metoprolol tartrate (LOPRESSOR) 25 MG tablet Take 1 tablet (25 mg total) by mouth 2 (two) times daily. 180 tablet 1  . Multiple Vitamin (MULTIVITAMIN WITH MINERALS) TABS tablet Take 1 tablet by mouth daily.    Marland Kitchen nystatin cream (MYCOSTATIN) Apply 1 application topically 2 (two) times daily. (Patient taking differently: Apply 1 application topically daily as needed (rash). ) 30 g 0  . pantoprazole (PROTONIX) 40 MG tablet TAKE ONE TABLET BY MOUTH TWO TIMES A DAY WITH FOOD (Patient taking differently: Take 40 mg by mouth 2 (two) times daily before a meal. ) 60 tablet 5   No current facility-administered medications for this visit.    Facility-Administered Medications Ordered in Other Visits  Medication Dose Route Frequency Provider Last Rate Last Dose  . hydrocortisone cream 1 % 1 application  1 application Topical TID PRN Satira Sark, MD        Allergies as of 04/23/2018  . (No Known Allergies)    Family History  Problem Relation Age of Onset  . Ovarian cancer Mother 54       Secondary to ovarian cancer  . Leukemia Mother   . Stroke Father 91       Brain stem infarction  . Breast cancer Paternal Aunt   . Crohn's disease Son   . Colon cancer Neg Hx     Social History   Socioeconomic History  . Marital status: Married    Spouse name: Not on file  . Number of children: 54  . Years of education: Not on file  . Highest education level: Not on file  Occupational History  . Not on file  Social Needs  . Financial resource strain: Not on file  . Food insecurity:    Worry: Not on file    Inability: Not on file  . Transportation needs:    Medical: Not on file    Non-medical: Not on file  Tobacco Use  . Smoking status:  Former Smoker    Packs/day: 0.25    Years: 2.00    Pack years: 0.50    Types: Cigarettes    Last attempt to quit: 08/03/1975    Years since quitting: 42.7  . Smokeless tobacco: Never Used  Substance and Sexual Activity  . Alcohol use: No    Alcohol/week: 0.0 standard drinks  . Drug use: No  . Sexual activity: Yes    Birth control/protection: Post-menopausal  Lifestyle  . Physical activity:    Days per week: Not on file    Minutes per session: Not on file  . Stress: Not on file  Relationships  . Social connections:    Talks on phone: Not on file    Gets  together: Not on file    Attends religious service: Not on file    Active member of club or organization: Not on file    Attends meetings of clubs or organizations: Not on file    Relationship status: Not on file  Other Topics Concern  . Not on file  Social History Narrative  . Not on file    Review of Systems: Gen: Denies fever, chills, anorexia. Denies fatigue, weakness, weight loss.  CV: Denies chest pain, palpitations, syncope, peripheral edema, and claudication. Resp: Denies dyspnea at rest, cough, wheezing, coughing up blood, and pleurisy. GI: see HPI  Derm: Denies rash, itching, dry skin Psych: Denies depression, anxiety, memory loss, confusion. No homicidal or suicidal ideation.  Heme: Denies bruising, bleeding, and enlarged lymph nodes.  Physical Exam: BP 136/70   Pulse 86   Temp (!) 97 F (36.1 C) (Oral)   Ht 5\' 7"  (1.702 m)   Wt 258 lb 6.4 oz (117.2 kg)   BMI 40.47 kg/m  General:   Alert and oriented. No distress noted. Pleasant and cooperative.  Head:  Normocephalic and atraumatic. Eyes:  Conjuctiva clear without scleral icterus. Mouth:  Oral mucosa pink and moist.  Abdomen:  +BS, soft, TTP LUQ and non-distended. Larger AP diameter, difficult to appreciate HSM due to AP diameter Msk:  Symmetrical without gross deformities. Normal posture. Extremities:  Without edema. Neurologic:  Alert and  oriented  x4 Psych:  Alert and cooperative. Normal mood and affect.

## 2018-04-23 NOTE — Assessment & Plan Note (Signed)
Due to internal hemorrhoids. Intermittent, low-volume. Not a candidate for banding as she would need to be off Eliquis a considerable amount of time. Occasional rectal discomfort. I have called in the [place] hemorrhoid cream with lidocaine and having it made into suppositories, as she prefers this. 30 suppositories for approximately 60$. Patient aware and desires to do this route. Return in 6 months or sooner if needed.

## 2018-04-24 NOTE — Progress Notes (Signed)
cc'd to pcp 

## 2018-05-11 NOTE — Progress Notes (Deleted)
[place]  [phone] [phone]  ID: Daisy Black OB: Sep 26, 1950  MR#: [id number]  ZWC#:[id number]  Patient Care Team: Renee Rival, NP as PCP - General (Nurse Practitioner) Satira Sark, MD as PCP - Cardiology (Cardiology) Aviva Signs, MD (General Surgery) Rico Junker, RN as Registered Nurse Theodore Demark, RN as Registered Nurse Satira Sark, MD as Consulting Physician (Cardiology) Gala Romney Cristopher Estimable, MD as Consulting Physician (Gastroenterology)  CHIEF COMPLAINT: CLL  INTERVAL HISTORY: Patient returns to clinic today for further evaluation, laboratory work, and discussion of her imaging results.  She continues to have chronic weakness and fatigue, but otherwise feels well.  She has no neurologic complaints.  She denies any shortness of breath or chest pain.  She denies any early satiety, reflux, or nausea. She has no fevers, chills, or night sweats.  She has a good appetite and denies weight loss.  She denies any nausea, vomiting, constipation, or diarrhea.  Patient offers no further specific complaints today.    REVIEW OF SYSTEMS:   Review of Systems  Constitutional: Positive for malaise/fatigue. Negative for diaphoresis, fever and weight loss.  Respiratory: Negative.  Negative for cough and shortness of breath.   Cardiovascular: Negative.  Negative for chest pain and leg swelling.  Gastrointestinal: Negative.  Negative for abdominal pain, blood in stool, constipation, diarrhea, heartburn, melena, nausea and vomiting.  Genitourinary: Negative.   Musculoskeletal: Negative.   Skin: Negative.  Negative for rash.  Neurological: Positive for weakness. Negative for sensory change and focal weakness.  Psychiatric/Behavioral: The patient is nervous/anxious.     As per HPI. Otherwise, a complete review of systems is negative.  PAST MEDICAL HISTORY: Past Medical History:  Diagnosis Date  . Anemia   . Arthritis   .  CLL (chronic lymphocytic leukemia) ([place])   . Essential hypertension   . GERD (gastroesophageal reflux disease)   . History of hiatal hernia   . Hyperlipidemia   . Paroxysmal atrial fibrillation ([facility])   . Splenomegaly     PAST SURGICAL HISTORY: Past Surgical History:  Procedure Laterality Date  . ANKLE SURGERY Right    repair fracture  . CARDIOVERSION N/A 03/20/2016   Procedure: CARDIOVERSION;  Surgeon: Satira Sark, MD;  Location: [facility];  Service: Cardiovascular;  Laterality: N/A;  . CARDIOVERSION N/A 05/22/2016   Procedure: CARDIOVERSION;  Surgeon: Satira Sark, MD;  Location: [facility];  Service: Cardiovascular;  Laterality: N/A;  . CHOLECYSTECTOMY    . COLONOSCOPY WITH PROPOFOL N/A 03/27/2018   Internal hemorrhoids, diverticulosis in sigmoid and descending colon, three 4-6 mm polyps at IC valve. tubular adenomas 5 year surveillance  . ESOPHAGOGASTRODUODENOSCOPY (EGD) WITH PROPOFOL N/A 03/27/2018   Moderate Schatzki's ring s/p dilation, small hiatal hernia  . HERNIA REPAIR  2011   Incisional and umbilical utilizing mesh  . MALONEY DILATION N/A 03/27/2018   Procedure: Venia Minks DILATION;  Surgeon: Daneil Dolin, MD;  [facility];  Service: Endoscopy;  Laterality: N/A;  . POLYPECTOMY  03/27/2018   Procedure: POLYPECTOMY;  Surgeon: Daneil Dolin, MD;  [facility];  Service: Endoscopy;;  colon  . ROTATOR CUFF REPAIR Left   . TEE WITHOUT CARDIOVERSION N/A 01/17/2016   Procedure: TRANSESOPHAGEAL ECHOCARDIOGRAM (TEE);  Surgeon: Herminio Commons, MD;  Location: [facility];  Service: Cardiovascular;  Laterality: N/A;  . TEE WITHOUT CARDIOVERSION N/A 02/21/2016   Procedure: TRANSESOPHAGEAL ECHOCARDIOGRAM (TEE) WITH PROPOFOL;  Surgeon: Herminio Commons, MD;  Location: [facility];  Service: Cardiovascular;  Laterality: N/A;  . TEE WITHOUT CARDIOVERSION N/A 03/20/2016   Procedure: TRANSESOPHAGEAL ECHOCARDIOGRAM (TEE) WITH PROPOFOL;  Surgeon: Satira Sark, MD;   Location: [facility];  Service: Cardiovascular;  Laterality: N/A;  . TONSILLECTOMY      FAMILY HISTORY Family History  Problem Relation Age of Onset  . Ovarian cancer Mother 78       Secondary to ovarian cancer  . Leukemia Mother   . Stroke Father 7       Brain stem infarction  . Breast cancer Paternal Aunt   . Crohn's disease Son   . Colon cancer Neg Hx        ADVANCED DIRECTIVES:    HEALTH MAINTENANCE: Social History   Tobacco Use  . Smoking status: Former Smoker    Packs/day: 0.25    Years: 2.00    Pack years: 0.50    Types: Cigarettes    Last attempt to quit: 08/03/1975    Years since quitting: 42.8  . Smokeless tobacco: Never Used  Substance Use Topics  . Alcohol use: No    Alcohol/week: 0.0 standard drinks  . Drug use: No     Colonoscopy:  PAP:  Bone density:  Lipid panel:  No Known Allergies  Current Outpatient Medications  Medication Sig Dispense Refill  . acetaminophen (TYLENOL) 650 MG CR tablet Take 650-1,300 mg by mouth daily as needed for pain.     Marland Kitchen apixaban (ELIQUIS) 5 MG TABS tablet Take 1 tablet (5 mg total) by mouth 2 (two) times daily. 180 tablet 3  . cetirizine (ZYRTEC) 10 MG tablet Take 10 mg by mouth daily as needed for allergies.    . Ferrous Gluconate-C-Folic Acid (IRON-C PO) Take 1 tablet by mouth daily.    . flecainide (TAMBOCOR) 50 MG tablet Take 1.5 tablets (75 mg total) by mouth 2 (two) times daily. (Patient taking differently: Take 50 mg by mouth 2 (two) times daily. ) 270 tablet 3  . fluticasone (FLONASE) 50 MCG/ACT nasal spray Place 1 spray into both nostrils daily as needed for allergies or rhinitis.    . furosemide (LASIX) 20 MG tablet Take 1 tablet (20 mg total) by mouth daily as needed. (Patient taking differently: Take 20 mg by mouth daily as needed for fluid. ) 30 tablet 6  . hydrocortisone (ANUSOL-HC) 2.5 % rectal cream Apply 1 application topically daily as needed for itching.  1  . hydrocortisone (ANUSOL-HC) 2.5 % rectal cream  Place 1 application rectally 2 (two) times daily. 30 g 1  . levothyroxine (SYNTHROID, LEVOTHROID) 125 MCG tablet Take 125 mcg by mouth daily before breakfast.    . metoprolol tartrate (LOPRESSOR) 25 MG tablet Take 1 tablet (25 mg total) by mouth 2 (two) times daily. 180 tablet 1  . Multiple Vitamin (MULTIVITAMIN WITH MINERALS) TABS tablet Take 1 tablet by mouth daily.    Marland Kitchen nystatin cream (MYCOSTATIN) Apply 1 application topically 2 (two) times daily. (Patient taking differently: Apply 1 application topically daily as needed (rash). ) 30 g 0  . pantoprazole (PROTONIX) 40 MG tablet TAKE ONE TABLET BY MOUTH TWO TIMES A DAY WITH FOOD (Patient taking differently: Take 40 mg by mouth 2 (two) times daily before a meal. ) 60 tablet 5   No current facility-administered medications for this visit.    Facility-Administered Medications Ordered in Other Visits  Medication Dose Route Frequency Provider Last Rate Last Dose  . hydrocortisone cream 1 % 1 application  1 application Topical TID PRN Domenic Polite,  Aloha Gell, MD        OBJECTIVE: There were no vitals filed for this visit.   There is no height or weight on file to calculate BMI.    ECOG FS:0 - Asymptomatic  General: Well-developed, well-nourished, no acute distress. Eyes: Pink conjunctiva, anicteric sclera. HEENT: Normocephalic, moist mucous membranes, clear oropharnyx. Lungs: Clear to auscultation bilaterally. Heart: Regular rate and rhythm. No rubs, murmurs, or gallops. Abdomen: Soft, nontender, nondistended.  Mild splenomegaly, normoactive bowel sounds. Musculoskeletal: No edema, cyanosis, or clubbing. Neuro: Alert, answering all questions appropriately. Cranial nerves grossly intact. Skin: No rashes or petechiae noted. Psych: Normal affect. Lymphatics: No cervical, calvicular, axillary or inguinal LAD.  LAB RESULTS:  Lab Results  Component Value Date   NA 141 11/21/2017   K 4.2 11/21/2017   CL 108 11/21/2017   CO2 26 11/21/2017    GLUCOSE 90 11/21/2017   BUN 18 11/21/2017   CREATININE 0.81 11/21/2017   CALCIUM 10.2 11/21/2017   PROT 6.5 11/21/2017   ALBUMIN 4.2 11/21/2017   AST 20 11/21/2017   ALT 23 11/21/2017   ALKPHOS 92 11/21/2017   BILITOT 1.0 11/21/2017   GFRNONAA >60 11/21/2017   GFRAA >60 11/21/2017    Lab Results  Component Value Date   WBC 30.2 (H) 11/21/2017   NEUTROABS 3.5 11/21/2017   HGB 14.2 11/21/2017   HCT 42.9 11/21/2017   MCV 95.0 11/21/2017   PLT 128 (L) 11/21/2017     STUDIES: No results found.  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  PLAN:    1. CLL: CT scan results from November 11, 2017 reviewed independently and reported as above with unchanged minimal lymphadenopathy as well as mild splenomegaly.  Her white blood cell count has only mildly increased to 30.2.  Patient's last infusion of Rituxan was on May 17, 2016.  No intervention is needed at this time.  Return to clinic in 6 months with repeat imaging, laboratory work, and further evaluation.   2. Early satiety, nausea, vomiting: Patient does not complain of this today. Splenomegaly significantly improved with treatment. 3. Atrial fibrillation: Continue monitoring and treatment per cardiology. 4. Iron deficiency anemia: Patient's hemoglobin continues to be within normal limits.  She last received IV Feraheme in May 2017.  5. PET positive thyroid lesions: 1.2 cm right lobe nodule also seen on thyroid ultrasound April 06, 2016. Recommendation at that time was annual follow-up.   Approximately 20 minutes was spent in discussion of which greater than 50% was consultation.  Patient expressed understanding and was in agreement with this plan. She also understands that She can call clinic at any time with any questions, concerns, or complaints.     Lloyd Huger, MD   05/11/2018 7:48 PM

## 2018-05-12 ENCOUNTER — Inpatient Hospital Stay: Payer: Medicare Other

## 2018-05-12 ENCOUNTER — Inpatient Hospital Stay: Payer: Medicare Other | Admitting: Oncology

## 2018-05-13 ENCOUNTER — Ambulatory Visit: Payer: Medicare Other | Admitting: Cardiology

## 2018-05-19 ENCOUNTER — Ambulatory Visit: Payer: Medicare Other | Admitting: Cardiology

## 2018-05-22 ENCOUNTER — Other Ambulatory Visit: Payer: Medicare Other

## 2018-05-22 ENCOUNTER — Ambulatory Visit: Payer: Medicare Other | Admitting: Oncology

## 2018-05-23 IMAGING — DX DG CHEST 2V
2 series · 2 of 2 positions shown · non-contrast
Comparison: 08/03/2011

CLINICAL DATA: Chest pain with rapid heart rate for several hours
this morning.

EXAM:
CHEST  2 VIEW

[chest pa]
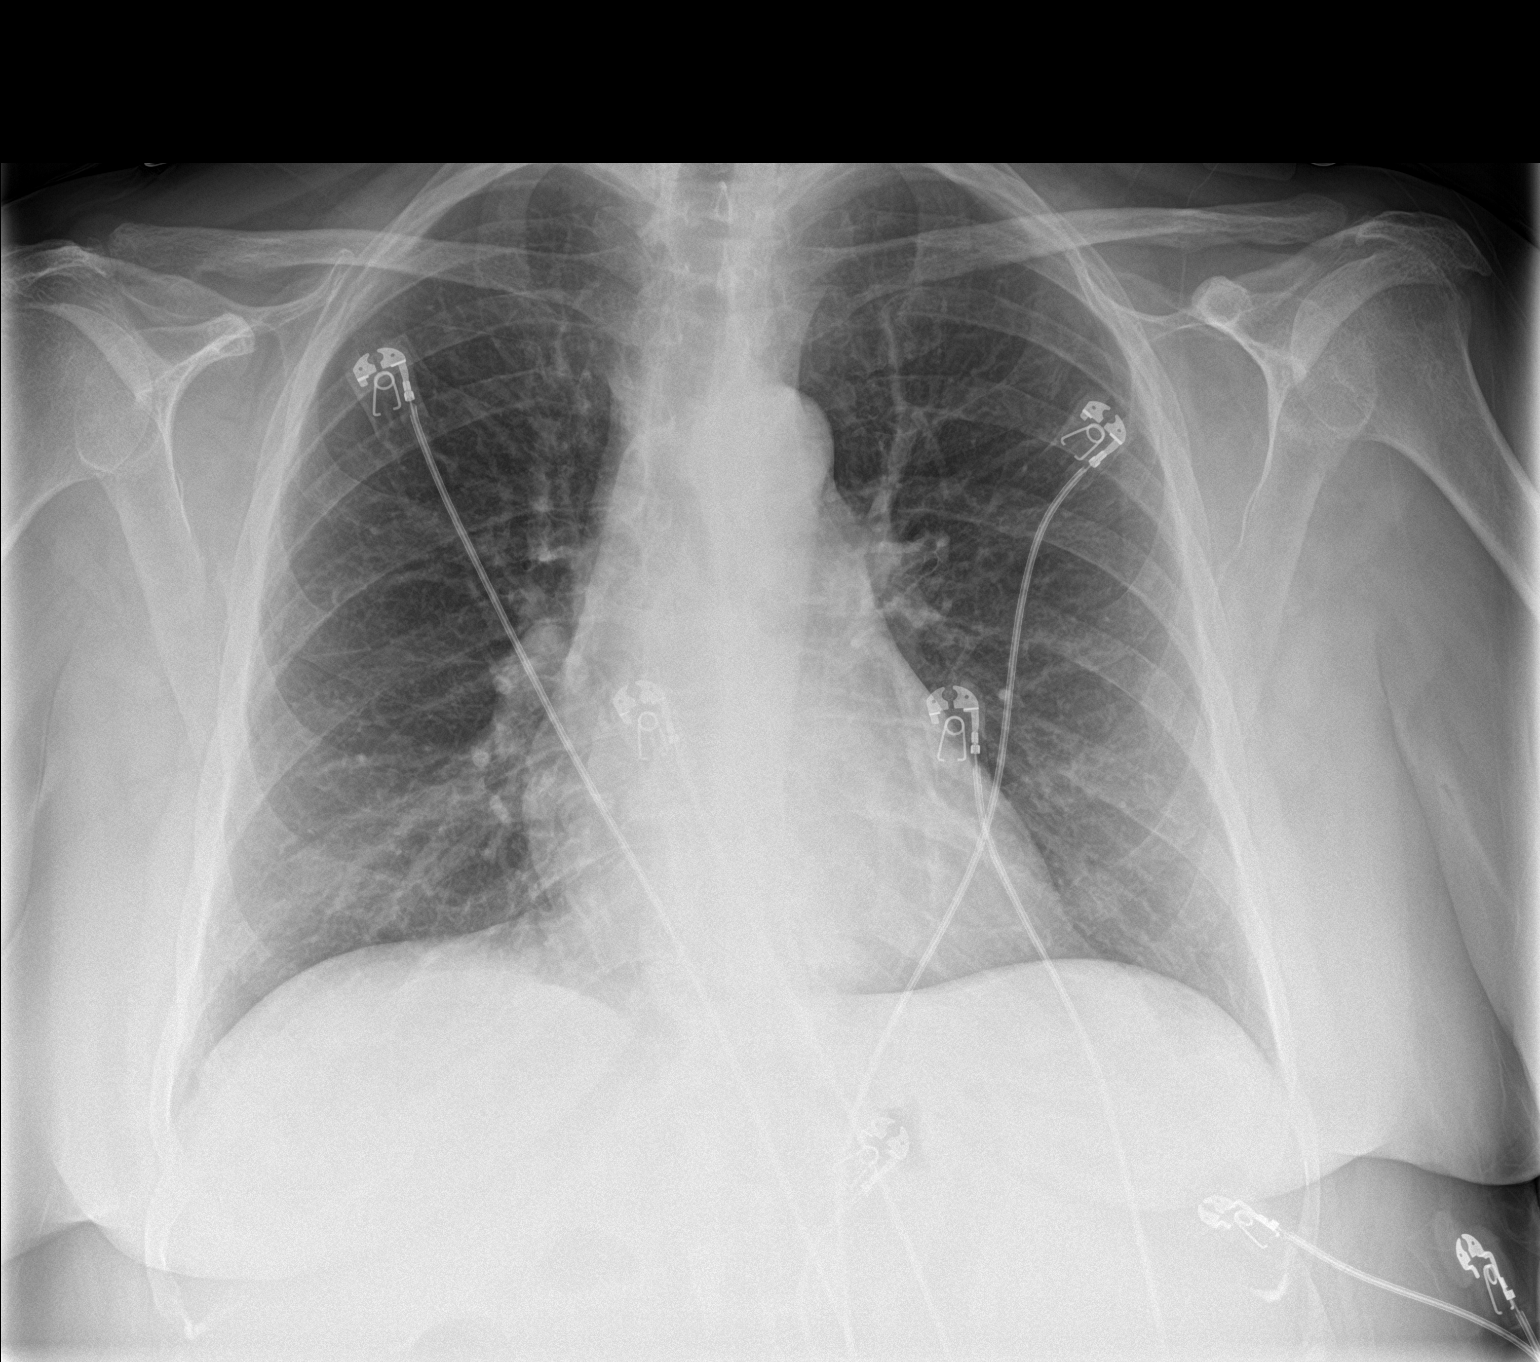

[chest lat]
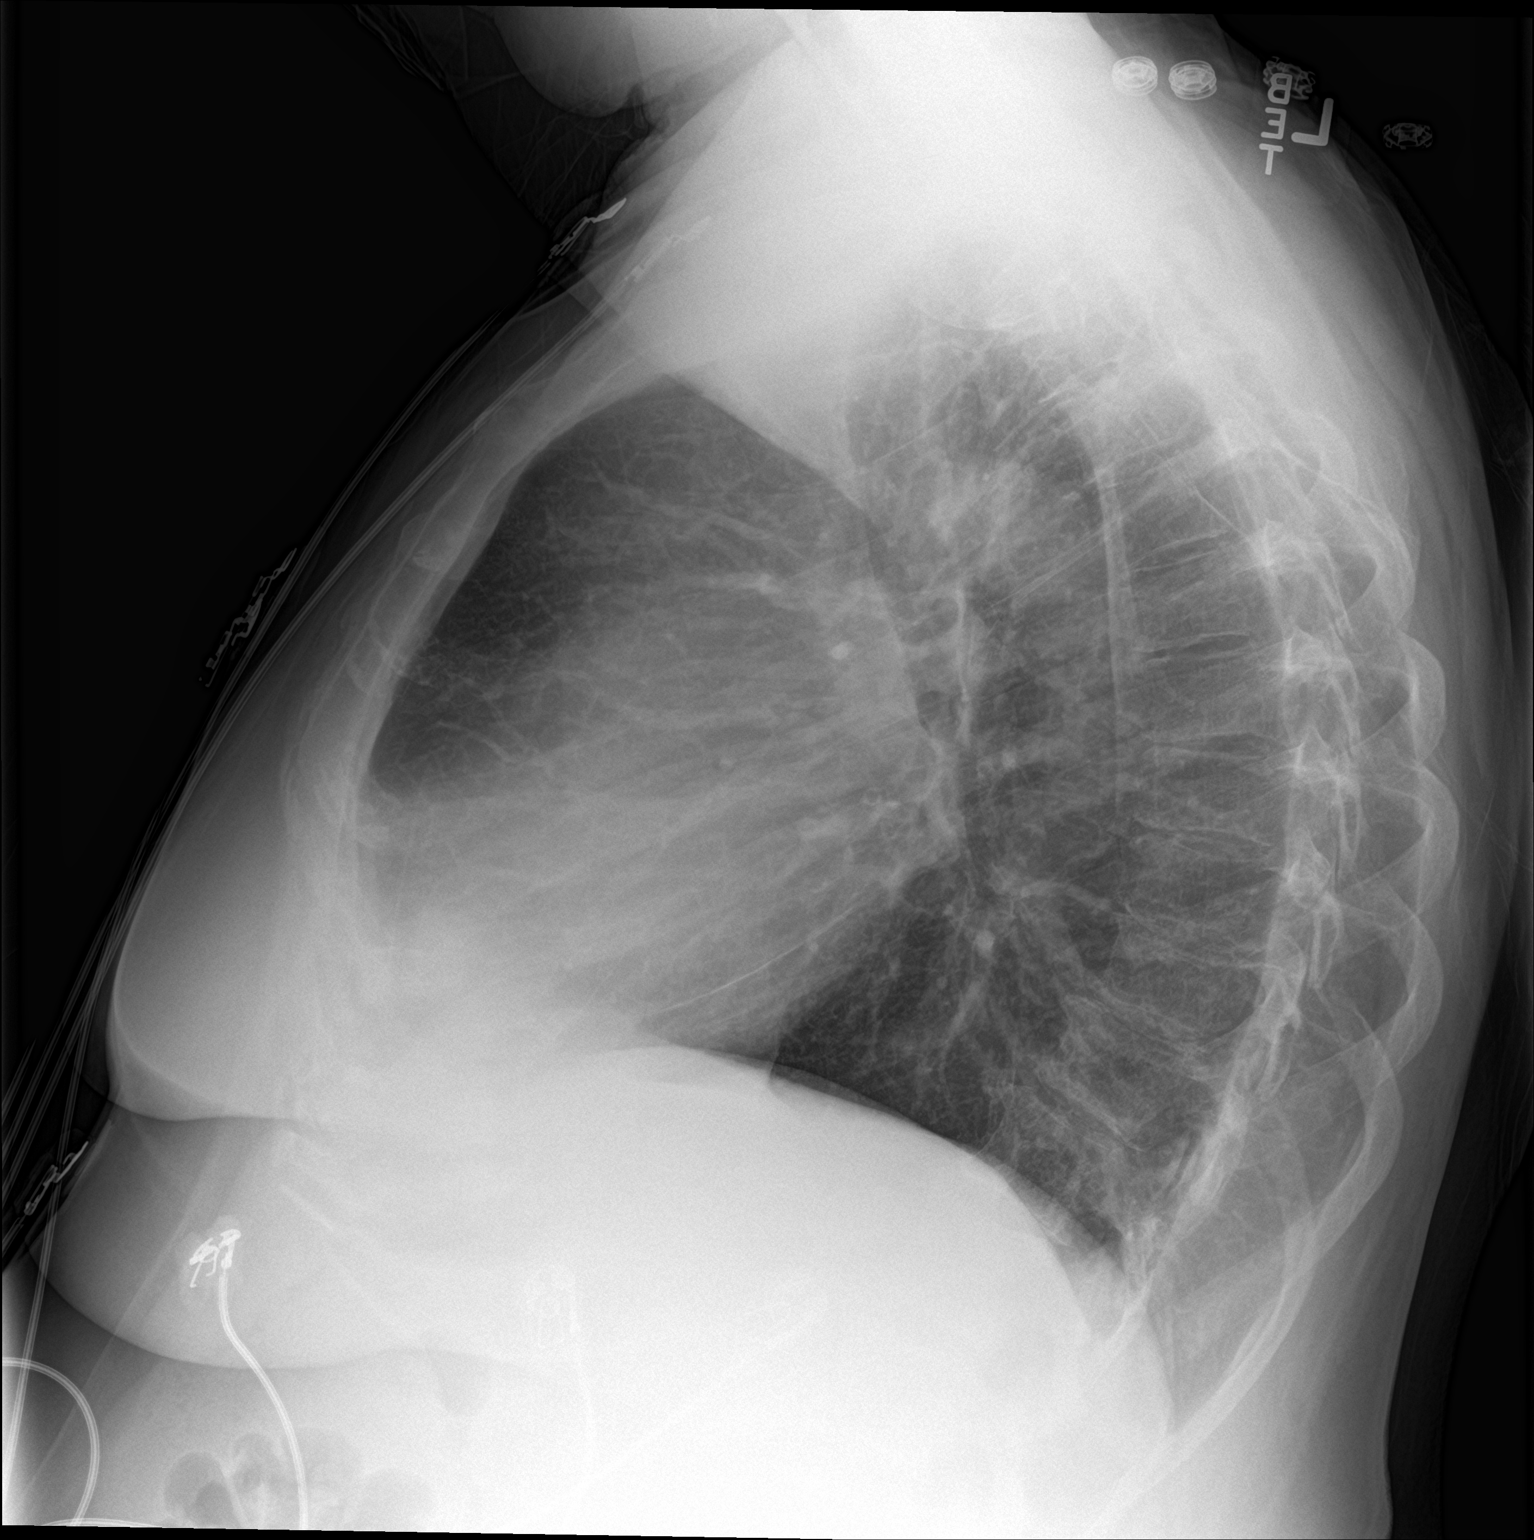

[2 of 2 positions shown; findings below may reference images not displayed]

FINDINGS: Emphysematous changes and scattered fibrosis in the lungs. Normal
heart size and pulmonary vascularity. No focal airspace disease or
consolidation in the lungs. No blunting of costophrenic angles. No
pneumothorax. Mediastinal contours appear intact. Degenerative
changes in the spine.
IMPRESSION: No active cardiopulmonary disease.

## 2018-05-26 ENCOUNTER — Other Ambulatory Visit: Payer: Self-pay | Admitting: Cardiology

## 2018-05-26 MED ORDER — APIXABAN 5 MG PO TABS: 5.0000 mg | ORAL_TABLET | Freq: Two times a day (BID) | ORAL | 3 refills | Status: DC

## 2018-05-26 NOTE — Telephone Encounter (Signed)
Lm for pt MD not in office, pick up tomorrow when Dr.McDowell is back in office

## 2018-05-26 NOTE — Telephone Encounter (Signed)
patient would like a Rx for Eliquis and she will pick it up She gets filled in [place]

## 2018-05-28 ENCOUNTER — Ambulatory Visit: Payer: Medicare Other

## 2018-06-04 ENCOUNTER — Ambulatory Visit: Payer: Medicare Other

## 2018-06-04 ENCOUNTER — Ambulatory Visit: Admission: RE | Admit: 2018-06-04 | Payer: Medicare Other | Source: Ambulatory Visit

## 2018-06-11 ENCOUNTER — Ambulatory Visit: Admission: RE | Admit: 2018-06-11 | Payer: Medicare Other | Source: Ambulatory Visit

## 2018-06-23 ENCOUNTER — Encounter: Payer: Self-pay | Admitting: Cardiology

## 2018-06-23 ENCOUNTER — Ambulatory Visit (INDEPENDENT_AMBULATORY_CARE_PROVIDER_SITE_OTHER): Payer: Medicare Other | Admitting: Cardiology

## 2018-06-23 VITALS — BP 118/74 | HR 82 | Ht 68.0 in | Wt 262.8 lb

## 2018-06-23 DIAGNOSIS — I480 Paroxysmal atrial fibrillation: Secondary | ICD-10-CM

## 2018-06-23 DIAGNOSIS — I10 Essential (primary) hypertension: Secondary | ICD-10-CM | POA: Diagnosis not present

## 2018-06-23 DIAGNOSIS — R001 Bradycardia, unspecified: Secondary | ICD-10-CM | POA: Diagnosis not present

## 2018-06-23 MED ORDER — APIXABAN 5 MG PO TABS: 5.0000 mg | ORAL_TABLET | Freq: Two times a day (BID) | ORAL | 0 refills | Status: DC

## 2018-06-23 NOTE — Progress Notes (Signed)
Cardiology Office Note  Date: 06/23/2018   ID: Daisy Black, Daisy Black 1950-10-28, MRN [id number]  PCP: Renee Rival, NP  Primary Cardiologist: Rozann Lesches, MD   Chief Complaint  Patient presents with  . Atrial Fibrillation    History of Present Illness: Daisy Black is a 67 y.o. female last seen in July.  She is here today for a routine visit.  In terms of palpitations, she states that she has been doing well.  She has an app on her phone and can record heart rate, we went over these today, she did have some elevations back in October, but none in November.  Heart rate gets up into the 140s when she is in atrial fibrillation. These episodes are generally brief, lasting only a few minutes.  She did not tolerate higher dose of flecainide, states that it gave her a headache and chest pain.  She is doing well on flecainide 50 mg twice daily along with Lopressor and Eliquis.  I reviewed her last ECG and lab work.  She will be having follow-up lab work in the next few weeks.  She does not report any bleeding episodes.  Past Medical History:  Diagnosis Date  . Anemia   . Arthritis   . CLL (chronic lymphocytic leukemia) ([place])   . Essential hypertension   . GERD (gastroesophageal reflux disease)   . History of hiatal hernia   . Hyperlipidemia   . Paroxysmal atrial fibrillation ([facility])   . Splenomegaly     Past Surgical History:  Procedure Laterality Date  . ANKLE SURGERY Right    repair fracture  . CARDIOVERSION N/A 03/20/2016   Procedure: CARDIOVERSION;  Surgeon: Satira Sark, MD;  Location: [facility];  Service: Cardiovascular;  Laterality: N/A;  . CARDIOVERSION N/A 05/22/2016   Procedure: CARDIOVERSION;  Surgeon: Satira Sark, MD;  Location: [facility];  Service: Cardiovascular;  Laterality: N/A;  . CHOLECYSTECTOMY    . COLONOSCOPY WITH PROPOFOL N/A 03/27/2018   Internal hemorrhoids, diverticulosis in sigmoid and descending colon, three 4-6 mm polyps at IC valve.  tubular adenomas 5 year surveillance  . ESOPHAGOGASTRODUODENOSCOPY (EGD) WITH PROPOFOL N/A 03/27/2018   Moderate Schatzki's ring s/p dilation, small hiatal hernia  . HERNIA REPAIR  2011   Incisional and umbilical utilizing mesh  . MALONEY DILATION N/A 03/27/2018   Procedure: Venia Minks DILATION;  Surgeon: Daneil Dolin, MD;  Location: [facility];  Service: Endoscopy;  Laterality: N/A;  . POLYPECTOMY  03/27/2018   Procedure: POLYPECTOMY;  Surgeon: Daneil Dolin, MD;  Location: [facility];  Service: Endoscopy;;  colon  . ROTATOR CUFF REPAIR Left   . TEE WITHOUT CARDIOVERSION N/A 01/17/2016   Procedure: TRANSESOPHAGEAL ECHOCARDIOGRAM (TEE);  Surgeon: Herminio Commons, MD;  Location: [facility];  Service: Cardiovascular;  Laterality: N/A;  . TEE WITHOUT CARDIOVERSION N/A 02/21/2016   Procedure: TRANSESOPHAGEAL ECHOCARDIOGRAM (TEE) WITH PROPOFOL;  Surgeon: Herminio Commons, MD;  Location: [facility];  Service: Cardiovascular;  Laterality: N/A;  . TEE WITHOUT CARDIOVERSION N/A 03/20/2016   Procedure: TRANSESOPHAGEAL ECHOCARDIOGRAM (TEE) WITH PROPOFOL;  Surgeon: Satira Sark, MD;  Location: [facility];  Service: Cardiovascular;  Laterality: N/A;  . TONSILLECTOMY      Current Outpatient Medications  Medication Sig Dispense Refill  . acetaminophen (TYLENOL) 650 MG CR tablet Take 650-1,300 mg by mouth daily as needed for pain.     Marland Kitchen apixaban (ELIQUIS) 5 MG TABS tablet Take 1 tablet (5 mg total) by mouth 2 (  two) times daily. 28 tablet 0  . cetirizine (ZYRTEC) 10 MG tablet Take 10 mg by mouth daily as needed for allergies.    . Ferrous Gluconate-C-Folic Acid (IRON-C PO) Take 1 tablet by mouth daily.    . flecainide (TAMBOCOR) 50 MG tablet Take 50 mg by mouth 2 (two) times daily.    . fluticasone (FLONASE) 50 MCG/ACT nasal spray Place 1 spray into both nostrils daily as needed for allergies or rhinitis.    . furosemide (LASIX) 20 MG tablet Take 1 tablet (20 mg total) by mouth daily as needed. (Patient  taking differently: Take 20 mg by mouth daily as needed for fluid. ) 30 tablet 6  . levothyroxine (SYNTHROID, LEVOTHROID) 125 MCG tablet Take 125 mcg by mouth daily before breakfast.    . metoprolol tartrate (LOPRESSOR) 25 MG tablet Take 1 tablet (25 mg total) by mouth 2 (two) times daily. 180 tablet 1  . Multiple Vitamin (MULTIVITAMIN WITH MINERALS) TABS tablet Take 1 tablet by mouth daily.    Marland Kitchen nystatin cream (MYCOSTATIN) Apply 1 application topically 2 (two) times daily. (Patient taking differently: Apply 1 application topically daily as needed (rash). ) 30 g 0  . pantoprazole (PROTONIX) 40 MG tablet TAKE ONE TABLET BY MOUTH TWO TIMES A DAY WITH FOOD (Patient taking differently: Take 40 mg by mouth 2 (two) times daily before a meal. ) 60 tablet 5   No current facility-administered medications for this visit.    Facility-Administered Medications Ordered in Other Visits  Medication Dose Route Frequency Provider Last Rate Last Dose  . hydrocortisone cream 1 % 1 application  1 application Topical TID PRN Satira Sark, MD       Allergies:  Patient has no known allergies.   Social History: The patient  reports that she quit smoking about 42 years ago. Her smoking use included cigarettes. She has a 0.50 pack-year smoking history. She has never used smokeless tobacco. She reports that she does not drink alcohol or use drugs.   ROS:  Please see the history of present illness. Otherwise, complete review of systems is positive for none.  All other systems are reviewed and negative.   Physical Exam: VS:  BP 118/74   Pulse 82   Ht 5\' 8"  (1.727 m)   Wt 262 lb 12.8 oz (119.2 kg)   SpO2 98%   BMI 39.96 kg/m , BMI Body mass index is 39.96 kg/m.  Wt Readings from Last 3 Encounters:  06/23/18 262 lb 12.8 oz (119.2 kg)  04/23/18 258 lb 6.4 oz (117.2 kg)  01/31/18 254 lb 3.2 oz (115.3 kg)    General: Patient appears comfortable at rest. HEENT: Conjunctiva and lids normal, oropharynx  clear. Neck: Supple, no elevated JVP or carotid bruits, no thyromegaly. Lungs: Clear to auscultation, nonlabored breathing at rest. Cardiac: Regular rate and rhythm, no S3 or significant systolic murmur. Abdomen: Soft, nontender, bowel sounds present. Extremities: Trace ankle edema, distal pulses 2+. Skin: Warm and dry. Musculoskeletal: No kyphosis. Neuropsychiatric: Alert and oriented x3, affect grossly appropriate.  ECG: I personally reviewed the tracing from 01/31/2018 which showed sinus rhythm with prolonged PR interval, QRS duration 94 ms, normal QT interval.  Recent Labwork: 11/21/2017: ALT 23; AST 20; BUN 18; Creatinine, Ser 0.81; Hemoglobin 14.2; Platelets 128; Potassium 4.2; Sodium 141  July 2019: Flecainide trough 0.29  Other Studies Reviewed Today:  TEE 03/20/2016: Study Conclusions  - Left ventricle: Systolic function was normal. The estimated ejection fraction was in the range of  60% to 65%. Wall motion was normal; there were no regional wall motion abnormalities. No evidence of thrombus. - Descending aorta: The descending aorta had minor luminal irregularities. - Mitral valve: There was mild regurgitation. - Left atrium: The atrium was mildly dilated. No evidence of thrombus in the atrial cavity or appendage. Prominent trabeculation visualized at tip of appendage. Emptying velocity was moderately reduced. - Right atrium: No evidence of thrombus in the atrial cavity or appendage. - Tricuspid valve: There was mild regurgitation. - Pericardium, extracardiac: There was no pericardial effusion.  Impressions:  - LVEF 60-65% without wall motion abnormalities. No left atrial appendage thrombus visualized. There is prominent trabeculation noted at the appendage tip. Emptying velocity was moderately reduced. No right atrial appendage thrombus noted. Mild tricuspid regurgitation.  Assessment and Plan:  1.  Paroxysmal atrial fibrillation.  She  reports good symptom control at this time on present regimen including flecainide, Lopressor, and Eliquis.  She will be having follow-up lab work in the next few weeks.  She does not report any bleeding episodes.  2.  Essential hypertension, blood pressure is well controlled today.  Keep follow-up with PCP.  3.  History of symptomatic bradycardia, tolerating present dose of Lopressor with heart rate in the 70s to 80s.  Current medicines were reviewed with the patient today.  Disposition: Follow-up in 6 months.  Signed, Satira Sark, MD, [facility] 06/23/2018 1:47 PM    [facility] [address], [place], [place] [address] Phone: [phone]; Fax: [phone]

## 2018-06-23 NOTE — Patient Instructions (Addendum)

## 2018-06-30 ENCOUNTER — Inpatient Hospital Stay: Admission: RE | Admit: 2018-06-30 | Payer: Medicare Other | Source: Ambulatory Visit

## 2018-07-02 ENCOUNTER — Ambulatory Visit
Admission: RE | Admit: 2018-07-02 | Discharge: 2018-07-02 | Disposition: A | Discharge status: Home / Self Care | Payer: Medicare Other | Source: Ambulatory Visit | Attending: Oncology | Admitting: Oncology

## 2018-07-02 DIAGNOSIS — C9110 Chronic lymphocytic leukemia of B-cell type not having achieved remission: Secondary | ICD-10-CM | POA: Diagnosis not present

## 2018-07-02 LAB — POCT I-STAT CREATININE: Creatinine, Ser: 0.8 mg/dL (ref 0.44–1.00)

## 2018-07-02 MED ORDER — IOPAMIDOL (ISOVUE-300) INJECTION 61%
100.0000 mL | Freq: Once | INTRAVENOUS | Status: AC | PRN
  Administered 2018-07-02: 100 mL via INTRAVENOUS

## 2018-07-06 IMAGING — US US SOFT TISSUE HEAD/NECK
1 series · 13 of 25 positions shown · non-contrast
Comparison: None.

CLINICAL DATA: Abnormal PET-CT. Hyper metabolic isthmus nodule has
enlarged. Hyper metabolic eft lobe nodule is new. A

EXAM:
THYROID ULTRASOUND
TECHNIQUE: Ultrasound examination of the thyroid gland and adjacent soft
tissues was performed.

[Series 1: us soft tissue head/neck · 0.07mm/px · 13 of 86 slices shown]
[im 1/86]
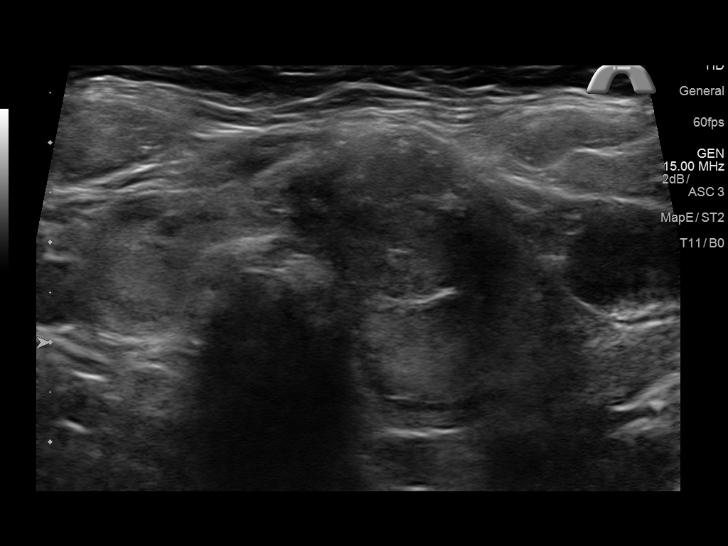
[im 8/86]
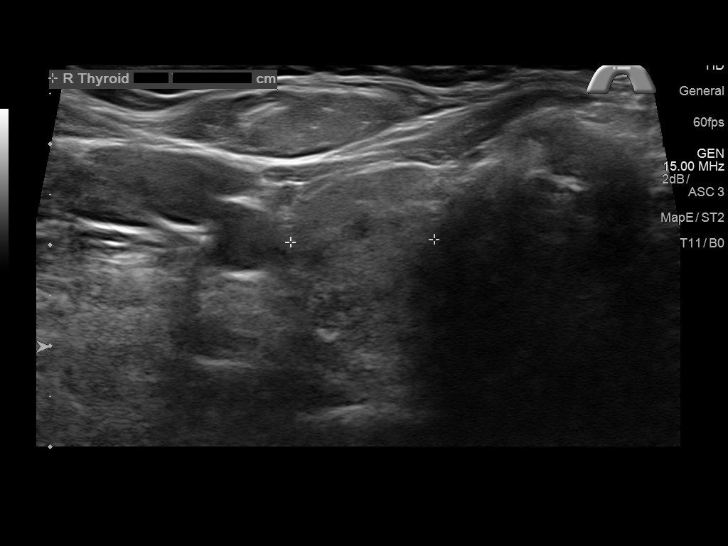
[im 15/86]
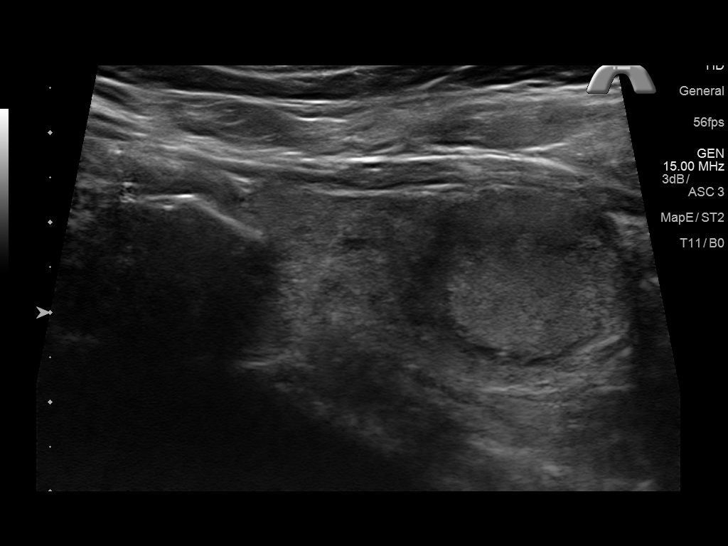
[im 22/86]
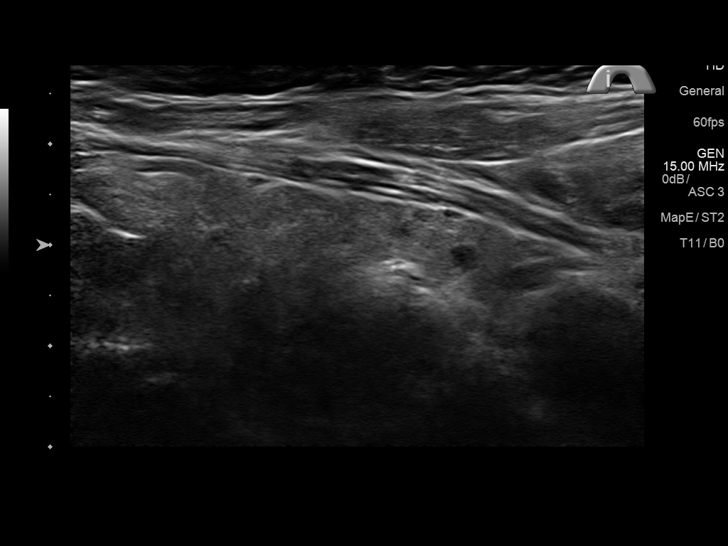
[im 29/86]
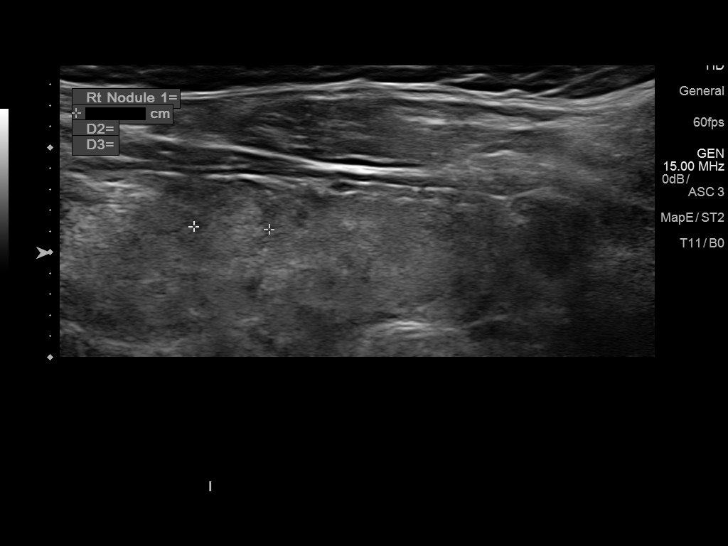
[im 36/86]
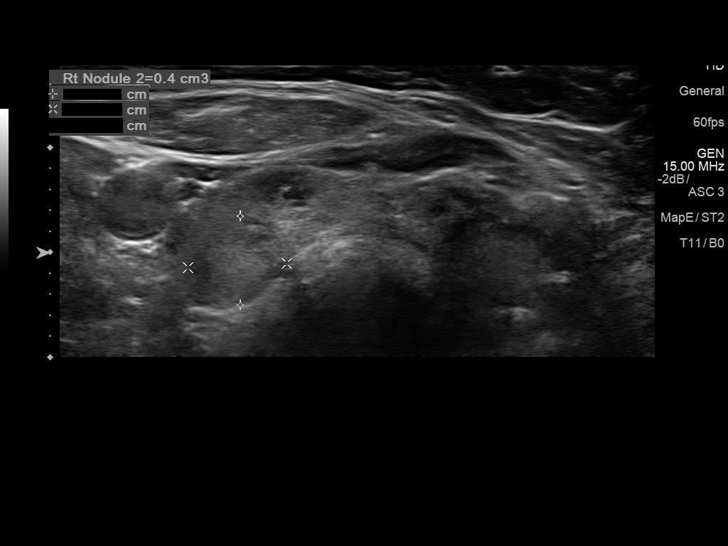
[im 43/86]
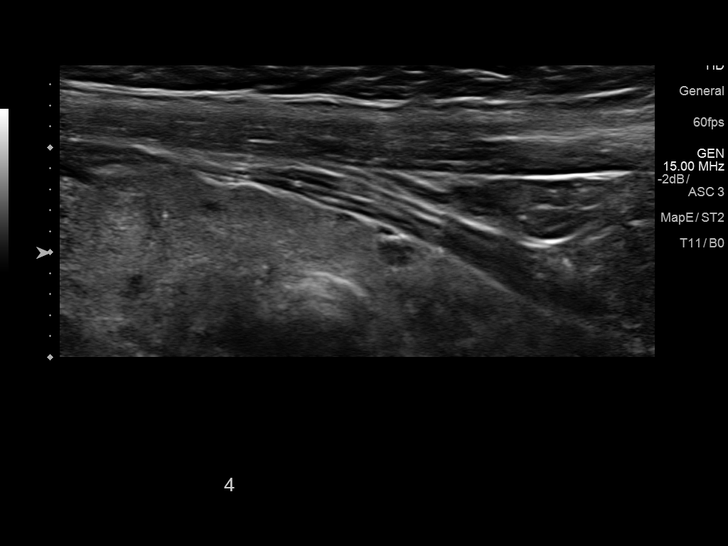
[im 50/86]
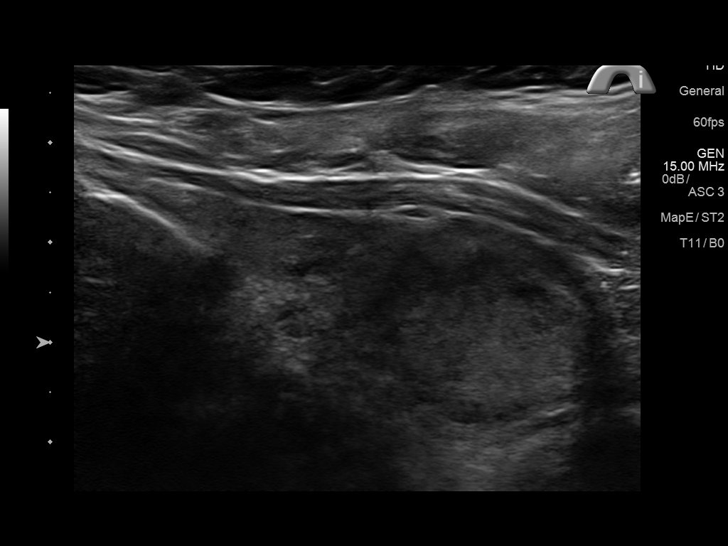
[im 57/86]
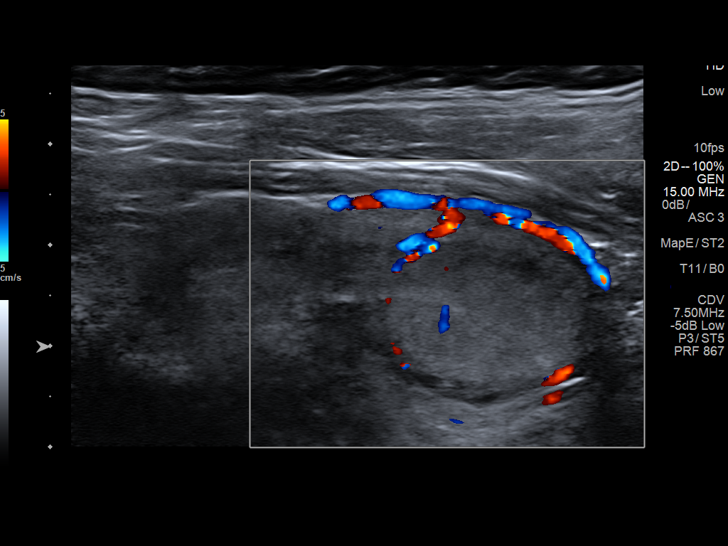
[im 64/86]
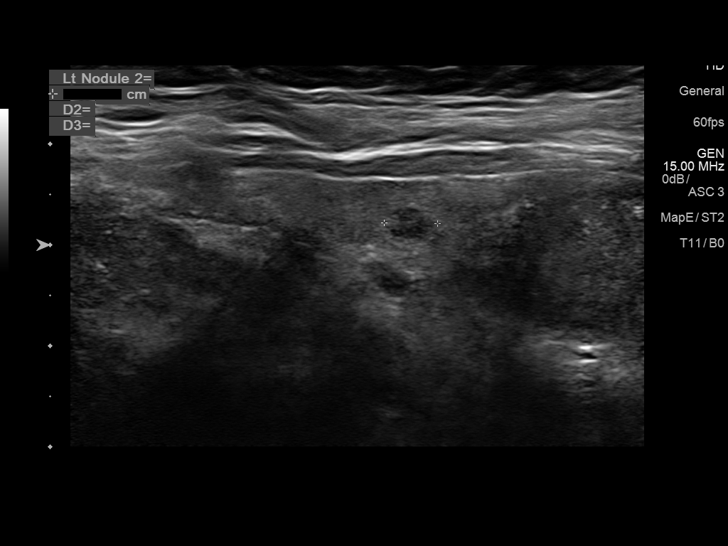
[im 71/86]
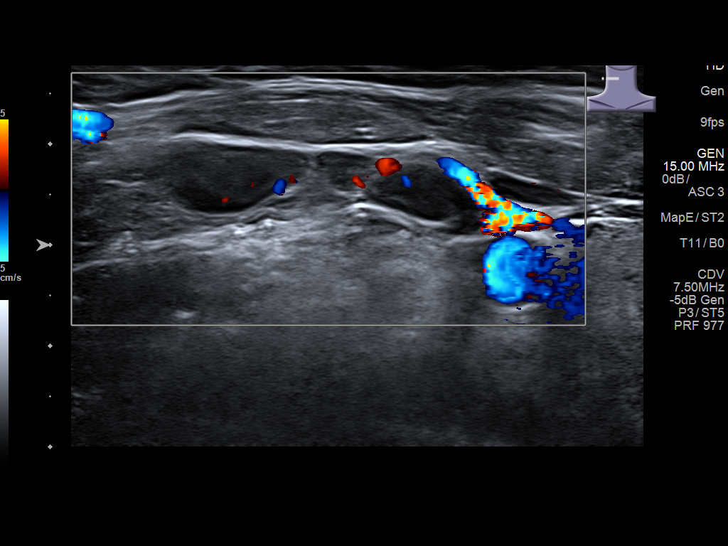
[im 78/86]
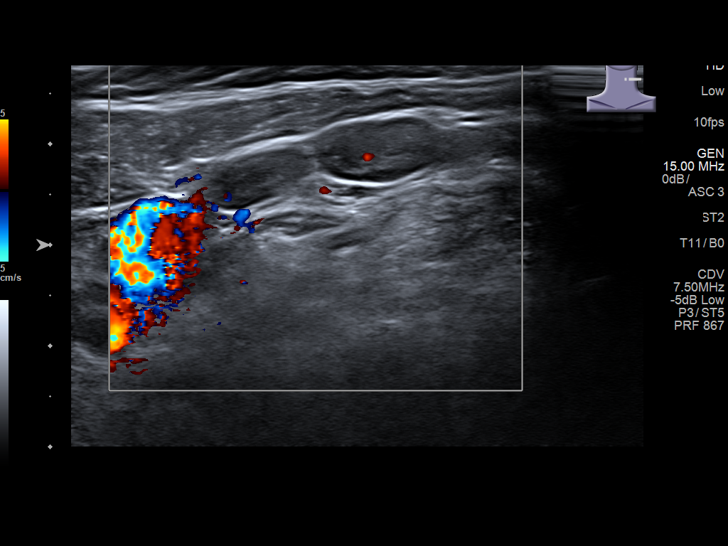
[im 86/86]
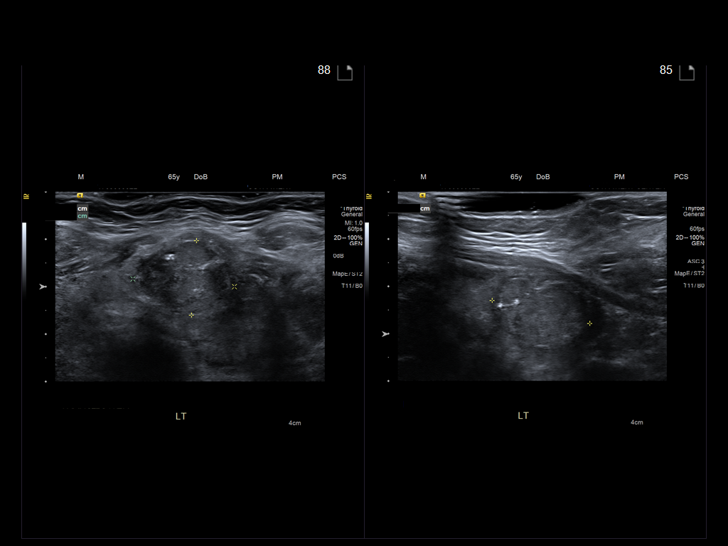

[13 of 25 positions shown; findings below may reference images not displayed]

FINDINGS: Parenchymal Echotexture: Markedly heterogenous

Estimated total number of nodules >/= 1 cm: 4

Number of spongiform nodules > 2 cm not described below (TR1): 0

Number of mixed cystic and solid nodules > 1.5 cm not described
below (TR2): 0

_________________________________________________________

Isthmus: 0.5 cm

Nodule # 1:

Location: Right; Mid

Size: 2.1 x 1.6 x 2.1 cm

Composition: solid/almost completely solid (2)

Echogenicity: isoechoic (1)

Shape: not taller-than-wide (0)

Margins: smooth (0)

Echogenic foci: punctate echogenic foci (3)

ACR TI-RADS total points: 6.

ACR TI-RADS risk category: TR4 (4-6 points).

ACR TI-RADS recommendations:

**Given size (>1.5 cm) and appearance, fine needle aspiration of
this moderately suspicious nodule should be considered based on
TI-RADS criteria.

_________________________________________________________

Right lobe: 4.7 x 2.0 x 1.4 cm

Nodule # 2:

Location: Right; Mid

Size: 1.2 x 0.8 x 0.4 cm

Composition: solid/almost completely solid (2)

Echogenicity: hypoechoic (2)

Shape: not taller-than-wide (0)

Margins: smooth (0)

Echogenic foci: none (0)

ACR TI-RADS total points: 4.

ACR TI-RADS risk category: TR4 (4-6 points).

ACR TI-RADS recommendations:

*Given size (1 - 1.5 cm) and appearance, a follow-up ultrasound in 1
year should be considered based on TI-RADS criteria.

Other smaller nodules are noted.

_________________________________________________________

Left lobe: 4.8 x 2.2 x 2.5 cm

Nodule # 3:

Location: Left; Inferior

Size: 2.4 x 2.3 x 2.2 cm

Composition: solid/almost completely solid (2)

Echogenicity: isoechoic (1)

Shape: taller-than-wide (3)

Margins: smooth (0)

Echogenic foci: none (0)

ACR TI-RADS total points: 6.

ACR TI-RADS risk category: TR4 (4-6 points).

ACR TI-RADS recommendations:

**Given size (>1.5 cm) and appearance, fine needle aspiration of
this moderately suspicious nodule should be considered based on
TI-RADS criteria.

Tiny lower pole nodule.
IMPRESSION: The isthmic and left lobe nodules meet criteria for fine needle
aspiration biopsy. These were noted to be hypermetabolic on PET and
correspond to the abnormalities.

A 1.2 cm right lobe nodule requires annual follow-up.

The above is in keeping with the ACR TI-RADS recommendations - [HOSPITAL] 7543;[DATE].

## 2018-07-07 NOTE — Progress Notes (Signed)
[facility]  [phone] [phone]  ID: Daisy Black OB: Jan 19, 1951  MR#: [id number]  LPF#:[id number]  Patient Care Team: Renee Rival, NP as PCP - General (Nurse Practitioner) Satira Sark, MD as PCP - Cardiology (Cardiology) Aviva Signs, MD (General Surgery) Rico Junker, RN as Registered Nurse Theodore Demark, RN as Registered Nurse Satira Sark, MD as Consulting Physician (Cardiology) Gala Romney Cristopher Estimable, MD as Consulting Physician (Gastroenterology)  CHIEF COMPLAINT: CLL  INTERVAL HISTORY: Patient returns to clinic today for further evaluation, laboratory work, and discussion of her imaging results.  She has multiple medical complaints including increased weakness and fatigue, early satiety, and intermittent back and abdominal pain.  She also has occasional nausea and indigestion. She has no neurologic complaints.  She denies any shortness of breath or chest pain.  She has no fevers, chills, or night sweats.  She has a poor appetite admits to weight loss.  She has no constipation or diarrhea.  She has no urinary complaints.  Patient offers no further specific complaints today.     REVIEW OF SYSTEMS:   Review of Systems  Constitutional: Positive for malaise/fatigue and weight loss. Negative for diaphoresis and fever.  Respiratory: Negative.  Negative for cough and shortness of breath.   Cardiovascular: Negative.  Negative for chest pain and leg swelling.  Gastrointestinal: Positive for abdominal pain and heartburn. Negative for blood in stool, constipation, diarrhea, melena, nausea and vomiting.  Genitourinary: Negative.   Musculoskeletal: Positive for back pain.  Skin: Negative.  Negative for rash.  Neurological: Positive for weakness. Negative for sensory change and focal weakness.  Psychiatric/Behavioral: The patient is nervous/anxious.     As per HPI. Otherwise, a complete review of systems is negative.  PAST  MEDICAL HISTORY: Past Medical History:  Diagnosis Date  . Anemia   . Arthritis   . CLL (chronic lymphocytic leukemia) ([facility])   . Essential hypertension   . GERD (gastroesophageal reflux disease)   . History of hiatal hernia   . Hyperlipidemia   . Paroxysmal atrial fibrillation ([facility])   . Splenomegaly     PAST SURGICAL HISTORY: Past Surgical History:  Procedure Laterality Date  . ANKLE SURGERY Right    repair fracture  . CARDIOVERSION N/A 03/20/2016   Procedure: CARDIOVERSION;  Surgeon: Satira Sark, MD;  Location: [facility];  Service: Cardiovascular;  Laterality: N/A;  . CARDIOVERSION N/A 05/22/2016   Procedure: CARDIOVERSION;  Surgeon: Satira Sark, MD;  Location: [facility];  Service: Cardiovascular;  Laterality: N/A;  . CHOLECYSTECTOMY    . COLONOSCOPY WITH PROPOFOL N/A 03/27/2018   Internal hemorrhoids, diverticulosis in sigmoid and descending colon, three 4-6 mm polyps at IC valve. tubular adenomas 5 year surveillance  . ESOPHAGOGASTRODUODENOSCOPY (EGD) WITH PROPOFOL N/A 03/27/2018   Moderate Schatzki's ring s/p dilation, small hiatal hernia  . HERNIA REPAIR  2011   Incisional and umbilical utilizing mesh  . MALONEY DILATION N/A 03/27/2018   Procedure: Venia Minks DILATION;  Surgeon: Daneil Dolin, MD;  Location: [facility];  Service: Endoscopy;  Laterality: N/A;  . POLYPECTOMY  03/27/2018   Procedure: POLYPECTOMY;  Surgeon: Daneil Dolin, MD;  Location: [facility];  Service: Endoscopy;;  colon  . ROTATOR CUFF REPAIR Left   . TEE WITHOUT CARDIOVERSION N/A 01/17/2016   Procedure: TRANSESOPHAGEAL ECHOCARDIOGRAM (TEE);  Surgeon: Herminio Commons, MD;  Location: [facility];  Service: Cardiovascular;  Laterality: N/A;  . TEE WITHOUT CARDIOVERSION N/A 02/21/2016   Procedure: TRANSESOPHAGEAL  ECHOCARDIOGRAM (TEE) WITH PROPOFOL;  Surgeon: Herminio Commons, MD;  Location: [facility];  Service: Cardiovascular;  Laterality: N/A;  . TEE WITHOUT CARDIOVERSION N/A 03/20/2016   Procedure:  TRANSESOPHAGEAL ECHOCARDIOGRAM (TEE) WITH PROPOFOL;  Surgeon: Satira Sark, MD;  Location: [facility];  Service: Cardiovascular;  Laterality: N/A;  . TONSILLECTOMY      FAMILY HISTORY Family History  Problem Relation Age of Onset  . Ovarian cancer Mother 73       Secondary to ovarian cancer  . Leukemia Mother   . Stroke Father 69       Brain stem infarction  . Breast cancer Paternal Aunt   . Crohn's disease Son   . Colon cancer Neg Hx        ADVANCED DIRECTIVES:    HEALTH MAINTENANCE: Social History   Tobacco Use  . Smoking status: Former Smoker    Packs/day: 0.25    Years: 2.00    Pack years: 0.50    Types: Cigarettes    Last attempt to quit: 08/03/1975    Years since quitting: 42.9  . Smokeless tobacco: Never Used  Substance Use Topics  . Alcohol use: No    Alcohol/week: 0.0 standard drinks  . Drug use: No     Colonoscopy:  PAP:  Bone density:  Lipid panel:  No Known Allergies  Current Outpatient Medications  Medication Sig Dispense Refill  . acetaminophen (TYLENOL) 650 MG CR tablet Take 650-1,300 mg by mouth daily as needed for pain.     Marland Kitchen apixaban (ELIQUIS) 5 MG TABS tablet Take 1 tablet (5 mg total) by mouth 2 (two) times daily. 28 tablet 0  . cetirizine (ZYRTEC) 10 MG tablet Take 10 mg by mouth daily as needed for allergies.    . Ferrous Gluconate-C-Folic Acid (IRON-C PO) Take 1 tablet by mouth daily.    . flecainide (TAMBOCOR) 50 MG tablet Take 50 mg by mouth 2 (two) times daily.    . fluticasone (FLONASE) 50 MCG/ACT nasal spray Place 1 spray into both nostrils daily as needed for allergies or rhinitis.    . furosemide (LASIX) 20 MG tablet Take 1 tablet (20 mg total) by mouth daily as needed. (Patient taking differently: Take 20 mg by mouth daily as needed for fluid. ) 30 tablet 6  . levothyroxine (SYNTHROID, LEVOTHROID) 125 MCG tablet Take 125 mcg by mouth daily before breakfast.    . Melatonin 5 MG SUBL Place 1 tablet under the tongue as needed (for  sleep).    . metoprolol tartrate (LOPRESSOR) 25 MG tablet Take 1 tablet (25 mg total) by mouth 2 (two) times daily. 180 tablet 1  . Multiple Vitamin (MULTIVITAMIN WITH MINERALS) TABS tablet Take 1 tablet by mouth daily.    Marland Kitchen nystatin cream (MYCOSTATIN) Apply 1 application topically 2 (two) times daily. (Patient taking differently: Apply 1 application topically daily as needed (rash). ) 30 g 0  . pantoprazole (PROTONIX) 40 MG tablet TAKE ONE TABLET BY MOUTH TWO TIMES A DAY WITH FOOD (Patient taking differently: Take 40 mg by mouth 2 (two) times daily before a meal. ) 60 tablet 5  . HYDROcodone-acetaminophen (NORCO/VICODIN) 5-325 MG tablet Take 1 tablet by mouth every 6 (six) hours as needed for moderate pain. 60 tablet 0  . ondansetron (ZOFRAN-ODT) 4 MG disintegrating tablet Take 1 tablet (4 mg total) by mouth every 8 (eight) hours as needed for nausea or vomiting. 30 tablet 2   No current facility-administered medications for this visit.  Facility-Administered Medications Ordered in Other Visits  Medication Dose Route Frequency Provider Last Rate Last Dose  . hydrocortisone cream 1 % 1 application  1 application Topical TID PRN Satira Sark, MD        OBJECTIVE: Vitals:   07/10/18 1442  BP: (!) 149/82  Pulse: 83  Temp: (!) 97.3 F (36.3 C)     Body mass index is 39.43 kg/m.    ECOG FS:0 - Asymptomatic  General: Well-developed, well-nourished, no acute distress. Eyes: Pink conjunctiva, anicteric sclera. HEENT: Normocephalic, moist mucous membranes, clear oropharnyx. Lungs: Clear to auscultation bilaterally. Heart: Regular rate and rhythm. No rubs, murmurs, or gallops. Abdomen: Soft, nontender, nondistended. Splenomegaly. Musculoskeletal: No edema, cyanosis, or clubbing. Neuro: Alert, answering all questions appropriately. Cranial nerves grossly intact. Skin: No rashes or petechiae noted. Psych: Normal affect.  LAB RESULTS:  Lab Results  Component Value Date   NA 141  11/21/2017   K 4.2 11/21/2017   CL 108 11/21/2017   CO2 26 11/21/2017   GLUCOSE 90 11/21/2017   BUN 18 11/21/2017   CREATININE 0.80 07/02/2018   CALCIUM 10.2 11/21/2017   PROT 6.5 11/21/2017   ALBUMIN 4.2 11/21/2017   AST 20 11/21/2017   ALT 23 11/21/2017   ALKPHOS 92 11/21/2017   BILITOT 1.0 11/21/2017   GFRNONAA >60 11/21/2017   GFRAA >60 11/21/2017    Lab Results  Component Value Date   WBC 46.6 (H) 07/10/2018   NEUTROABS 3.3 07/10/2018   HGB 13.6 07/10/2018   HCT 43.8 07/10/2018   MCV 97.3 07/10/2018   PLT 107 (L) 07/10/2018     STUDIES: Ct Soft Tissue Neck W Contrast  Result Date: 07/02/2018 CLINICAL DATA:  67 y/o  F; six-month follow-up of CLL. EXAM: CT NECK WITH CONTRAST TECHNIQUE: Multidetector CT imaging of the neck was performed using the standard protocol following the bolus administration of intravenous contrast. CONTRAST:  134mL ISOVUE-300 IOPAMIDOL (ISOVUE-300) INJECTION 61% COMPARISON:  11/11/2017 CT neck. 07/17/2016 PET-CT. 04/05/2016 thyroid ultrasound. FINDINGS: Pharynx and larynx: Normal. No mass or swelling. Salivary glands: No inflammation, mass, or stone. Thyroid: Multiple thyroid nodules with the largest nodule in the left lobe measuring 17 mm AP, stable from prior studies are measured in a similar fashion (series 4, image 81). The nodule appears mildly increased in comparison with the prior CT of the neck. Lymph nodes: Diffuse lymphadenopathy in the neck is increased in comparison with the prior CT of the neck. For example, a right parotid space node measures 10 mm, previously 8 mm (series 4, image 27) and left 5A node measures 11 mm, previously 9 mm (series 4, image 57). The right level 2A index node measures 17 x 23 mm, previously 14 x 20 mm measured in a similar fashion (series 4, image 42), Vascular: Negative. Limited intracranial: Negative. Visualized orbits: Negative. Mastoids and visualized paranasal sinuses: Clear. Skeleton: No acute or aggressive  process. Mild stable cervical spondylosis with disc and facet degenerative changes greatest at the C5-C7 levels. Upper chest: Negative. Other: None. IMPRESSION: 1. Increased diffuse cervical lymphadenopathy from prior CT neck. 2. Stable thyroid nodule in the left lobe measuring up to 17 mm. Follow-up as per 2017 thyroid ultrasound. Electronically Signed   By: Kristine Garbe M.D.   On: 07/02/2018 22:45   Ct Chest W Contrast  Result Date: 07/03/2018 CLINICAL DATA:  Chronic lymphocytic leukemia. EXAM: CT CHEST, ABDOMEN, AND PELVIS WITH CONTRAST TECHNIQUE: Multidetector CT imaging of the chest, abdomen and pelvis was performed following the standard  protocol during bolus administration of intravenous contrast. CONTRAST:  15mL ISOVUE-300 IOPAMIDOL (ISOVUE-300) INJECTION 61% COMPARISON:  11/11/2017 FINDINGS: CT CHEST FINDINGS Cardiovascular: The heart size is normal. No substantial pericardial effusion. Mediastinum/Nodes: Stable nodularity and enlargement of the left thyroid gland. Mild mediastinal and right hilar lymphadenopathy is similar. The most prominent lymphadenopathy in the chest is again noted to be in the axillary regions with increased number of lymph nodes noted in the supraclavicular regions and lower neck bilaterally. Index lymph nodes are compared as follows: *15 mm right hilar node (29/4) is stable. *14 mm short axis subcarinal lymph node (31/4) is unchanged. *14 mm right axillary node (21/4) was 13 mm previously. *14 mm left axillary lymph node (23/4) unchanged from 14 mm previously. No left hilar lymphadenopathy. The esophagus has normal imaging features. Lungs/Pleura: The central tracheobronchial airways are patent. No suspicious pulmonary nodule or mass. Musculoskeletal: No worrisome lytic or sclerotic osseous abnormality. CT ABDOMEN PELVIS FINDINGS Hepatobiliary: No focal abnormality within the liver parenchyma. Status post cholecystectomy. No intrahepatic or extrahepatic biliary  dilation. Pancreas: No focal mass lesion. No dilatation of the main duct. No intraparenchymal cyst. No peripancreatic edema. Spleen: Spleen is enlarged measuring 18.5 cm craniocaudal length. This is progressive in the interval as spleen measures 16.3 cm craniocaudal length previously Adrenals/Urinary Tract: No adrenal nodule or mass. Kidneys unremarkable. No evidence for hydroureter. The urinary bladder appears normal for the degree of distention. Stomach/Bowel: Stomach is nondistended. No gastric wall thickening. No evidence of outlet obstruction. Duodenum is normally positioned as is the ligament of Treitz. No small bowel wall thickening. No small bowel dilatation. The terminal ileum is normal. The appendix is normal. No gross colonic mass. No colonic wall thickening. Diverticular changes are noted in the left colon without evidence of diverticulitis. Vascular/Lymphatic: No abdominal aortic aneurysm. No abdominal aortic atherosclerotic calcification. Abdominal lymphadenopathy again noted in the gastrohepatic ligament, hepato duodenal ligament, retroperitoneal space, and pelvic sidewalls. Groin lymphadenopathy also evident bilaterally. Previously described index lymph nodes are measured today as follows: *10 mm aortocaval node (68/4) increased from 9 mm previously. *13 mm gastrohepatic ligament lymph node (57/4) is stable since prior. *19 mm porta hepatis lymph node (62/4) was 18 mm previously. 16 mm right common iliac node (93/4) was 14 mm previously. *15 mm left external iliac lymph node (113/4) was measured at 15 mm previously. *17 mm left inguinal lymph node (119/4) reported at 15 mm previously. Reproductive: Uterus unremarkable.  There is no adnexal mass. Other: No intraperitoneal free fluid. Musculoskeletal: No worrisome lytic or sclerotic osseous abnormality. IMPRESSION: 1. Lymphadenopathy again noted in the chest, abdomen, and pelvis. Comparison of index nodes between the 2 study shows most of these lymph  nodes stable in size although several show minimal interval increase. 2. Slight increase in splenomegaly with 18.5 cm craniocaudal length on today's exam. Electronically Signed   By: Misty Stanley M.D.   On: 07/03/2018 10:06   Ct Abdomen Pelvis W Contrast  Result Date: 07/03/2018 CLINICAL DATA:  Chronic lymphocytic leukemia. EXAM: CT CHEST, ABDOMEN, AND PELVIS WITH CONTRAST TECHNIQUE: Multidetector CT imaging of the chest, abdomen and pelvis was performed following the standard protocol during bolus administration of intravenous contrast. CONTRAST:  141mL ISOVUE-300 IOPAMIDOL (ISOVUE-300) INJECTION 61% COMPARISON:  11/11/2017 FINDINGS: CT CHEST FINDINGS Cardiovascular: The heart size is normal. No substantial pericardial effusion. Mediastinum/Nodes: Stable nodularity and enlargement of the left thyroid gland. Mild mediastinal and right hilar lymphadenopathy is similar. The most prominent lymphadenopathy in the chest is again noted to  be in the axillary regions with increased number of lymph nodes noted in the supraclavicular regions and lower neck bilaterally. Index lymph nodes are compared as follows: *15 mm right hilar node (29/4) is stable. *14 mm short axis subcarinal lymph node (31/4) is unchanged. *14 mm right axillary node (21/4) was 13 mm previously. *14 mm left axillary lymph node (23/4) unchanged from 14 mm previously. No left hilar lymphadenopathy. The esophagus has normal imaging features. Lungs/Pleura: The central tracheobronchial airways are patent. No suspicious pulmonary nodule or mass. Musculoskeletal: No worrisome lytic or sclerotic osseous abnormality. CT ABDOMEN PELVIS FINDINGS Hepatobiliary: No focal abnormality within the liver parenchyma. Status post cholecystectomy. No intrahepatic or extrahepatic biliary dilation. Pancreas: No focal mass lesion. No dilatation of the main duct. No intraparenchymal cyst. No peripancreatic edema. Spleen: Spleen is enlarged measuring 18.5 cm craniocaudal  length. This is progressive in the interval as spleen measures 16.3 cm craniocaudal length previously Adrenals/Urinary Tract: No adrenal nodule or mass. Kidneys unremarkable. No evidence for hydroureter. The urinary bladder appears normal for the degree of distention. Stomach/Bowel: Stomach is nondistended. No gastric wall thickening. No evidence of outlet obstruction. Duodenum is normally positioned as is the ligament of Treitz. No small bowel wall thickening. No small bowel dilatation. The terminal ileum is normal. The appendix is normal. No gross colonic mass. No colonic wall thickening. Diverticular changes are noted in the left colon without evidence of diverticulitis. Vascular/Lymphatic: No abdominal aortic aneurysm. No abdominal aortic atherosclerotic calcification. Abdominal lymphadenopathy again noted in the gastrohepatic ligament, hepato duodenal ligament, retroperitoneal space, and pelvic sidewalls. Groin lymphadenopathy also evident bilaterally. Previously described index lymph nodes are measured today as follows: *10 mm aortocaval node (68/4) increased from 9 mm previously. *13 mm gastrohepatic ligament lymph node (57/4) is stable since prior. *19 mm porta hepatis lymph node (62/4) was 18 mm previously. 16 mm right common iliac node (93/4) was 14 mm previously. *15 mm left external iliac lymph node (113/4) was measured at 15 mm previously. *17 mm left inguinal lymph node (119/4) reported at 15 mm previously. Reproductive: Uterus unremarkable.  There is no adnexal mass. Other: No intraperitoneal free fluid. Musculoskeletal: No worrisome lytic or sclerotic osseous abnormality. IMPRESSION: 1. Lymphadenopathy again noted in the chest, abdomen, and pelvis. Comparison of index nodes between the 2 study shows most of these lymph nodes stable in size although several show minimal interval increase. 2. Slight increase in splenomegaly with 18.5 cm craniocaudal length on today's exam. Electronically Signed   By:  Misty Stanley M.D.   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  PLAN:    1. CLL: CT scan results from July 02, 2018 reviewed independently and report as above with essentially stable lymphadenopathy, although patient's spleen has increased to 18.5 cm today.  Given her symptoms, we discussed the possibility of reinitiating treatment with Rituxan, but patient has declined.  Her white blood cell count has trended up to 46.6.  Patient expressed understanding that she likely will require treatment in the near future.  Patient last received single agent Rituxan on May 17, 2016.  No intervention is needed at this time.  Return to clinic in 3 months with repeat laboratory work and further evaluation.  Plan to reimage in June 2020. 2.  Early satiety, indigestion: Possibly secondary to increasing spleen size.  Patient declined intervention at this time. 3. Atrial fibrillation: Continue monitoring and treatment per cardiology. 4. Iron deficiency anemia: Patient's hemoglobin  continues to be within normal limits.  She last received IV Feraheme in May 2017.  5. PET positive thyroid lesions: 1.2 cm right lobe nodule also seen on thyroid ultrasound April 06, 2016.  Continue follow-up with ENT as indicated. 6.  Pain: Patient was given a prescription for hydrocodone today. 7.  Nausea: Continue ondansetron as prescribed.  Patient expressed understanding and was in agreement with this plan. She also understands that She can call clinic at any time with any questions, concerns, or complaints.     Lloyd Huger, MD   07/12/2018 7:38 AM

## 2018-07-09 ENCOUNTER — Other Ambulatory Visit: Payer: Self-pay | Admitting: Oncology

## 2018-07-09 NOTE — Progress Notes (Signed)
DISCONTINUE OFF PATHWAY REGIMEN - Lymphoma and CLL   [id number]:Rituximab (Weekly):   Administer weekly:     Rituximab        Dose Mod: None  **Always confirm dose/schedule in your pharmacy ordering system**  REASON: Other Reason PRIOR TREATMENT: Off Pathway: Rituximab (Weekly) TREATMENT RESPONSE: Complete Response (CR)  START OFF PATHWAY REGIMEN - Lymphoma and CLL   [id number]:Rituximab (Weekly):   Administer weekly:     Rituximab   **Always confirm dose/schedule in your pharmacy ordering system**  Patient Characteristics: Chronic Lymphocytic Leukemia (CLL), First Line, Treatment Indicated, Not a Candidate for BTK Inhibitor, 17p del (-) or Unknown, Fit Patient, Age ? 65 Disease Type: Chronic Lymphocytic Leukemia (CLL) Disease Type: Not Applicable Disease Type: Not Applicable Line of Therapy: First Line RAI Stage: II Treatment Indicated<= Treatment Indicated 17p Deletion Status: Negative Fit or Frail Patient<= Fit Patient Patient Age: Age ? 15 Intent of Therapy: Non-Curative / Palliative Intent, Discussed with Patient

## 2018-07-09 NOTE — Progress Notes (Signed)
OFF PATHWAY REGIMEN - Lymphoma and CLL  No Change  Continue With Treatment as Ordered.   [id number]:Rituximab (Weekly):   Administer weekly:     Rituximab        Dose Mod: None  **Always confirm dose/schedule in your pharmacy ordering system**  Patient Characteristics: Chronic Lymphocytic Leukemia (CLL), First Line, Treatment Indicated, 17p del (-), Fit Patient, Age ? 14 Disease Type: Chronic Lymphocytic Leukemia (CLL) Disease Type: Not Applicable Line of therapy: First Line RAI Stage: II Treatment Indicated<= Treatment Indicated* 17p Deletion Status: Negative Fit or Frail* Patient<= Fit Patient Patient Age: Age ? 7 Intent of Therapy: Non-Curative / Palliative Intent, Discussed with Patient

## 2018-07-10 ENCOUNTER — Inpatient Hospital Stay: Payer: Medicare Other

## 2018-07-10 ENCOUNTER — Inpatient Hospital Stay: Payer: Medicare Other | Attending: Oncology | Admitting: Oncology

## 2018-07-10 ENCOUNTER — Other Ambulatory Visit: Payer: Self-pay

## 2018-07-10 VITALS — BP 149/82 | HR 83 | Temp 97.3°F | Wt 259.3 lb

## 2018-07-10 DIAGNOSIS — I4891 Unspecified atrial fibrillation: Secondary | ICD-10-CM | POA: Diagnosis not present

## 2018-07-10 DIAGNOSIS — C9110 Chronic lymphocytic leukemia of B-cell type not having achieved remission: Secondary | ICD-10-CM

## 2018-07-10 DIAGNOSIS — R5383 Other fatigue: Secondary | ICD-10-CM | POA: Diagnosis not present

## 2018-07-10 DIAGNOSIS — R6881 Early satiety: Secondary | ICD-10-CM | POA: Insufficient documentation

## 2018-07-10 DIAGNOSIS — M549 Dorsalgia, unspecified: Secondary | ICD-10-CM | POA: Insufficient documentation

## 2018-07-10 DIAGNOSIS — R531 Weakness: Secondary | ICD-10-CM | POA: Diagnosis not present

## 2018-07-10 DIAGNOSIS — R110 Nausea: Secondary | ICD-10-CM | POA: Insufficient documentation

## 2018-07-10 DIAGNOSIS — R109 Unspecified abdominal pain: Secondary | ICD-10-CM | POA: Diagnosis not present

## 2018-07-10 DIAGNOSIS — E042 Nontoxic multinodular goiter: Secondary | ICD-10-CM | POA: Diagnosis not present

## 2018-07-10 DIAGNOSIS — Z87891 Personal history of nicotine dependence: Secondary | ICD-10-CM | POA: Diagnosis not present

## 2018-07-10 DIAGNOSIS — K30 Functional dyspepsia: Secondary | ICD-10-CM | POA: Diagnosis not present

## 2018-07-10 LAB — CBC WITH DIFFERENTIAL/PLATELET
Abs Immature Granulocytes: 0.05 10*3/uL (ref 0.00–0.07)
BASOS ABS: 0.2 10*3/uL — AB (ref 0.0–0.1)
BASOS PCT: 0 %
Eosinophils Absolute: 0.1 10*3/uL (ref 0.0–0.5)
Eosinophils Relative: 0 %
HEMATOCRIT: 43.8 % (ref 36.0–46.0)
Hemoglobin: 13.6 g/dL (ref 12.0–15.0)
IMMATURE GRANULOCYTES: 0 %
LYMPHS ABS: 40.9 10*3/uL — AB (ref 0.7–4.0)
Lymphocytes Relative: 89 %
MCH: 30.2 pg (ref 26.0–34.0)
MCHC: 31.1 g/dL (ref 30.0–36.0)
MCV: 97.3 fL (ref 80.0–100.0)
Monocytes Absolute: 2.1 10*3/uL — ABNORMAL HIGH (ref 0.1–1.0)
Monocytes Relative: 4 %
NEUTROS ABS: 3.3 10*3/uL (ref 1.7–7.7)
Neutrophils Relative %: 7 %
Platelets: 107 10*3/uL — ABNORMAL LOW (ref 150–400)
RBC: 4.5 MIL/uL (ref 3.87–5.11)
RDW: 14.3 % (ref 11.5–15.5)
WBC: 46.6 10*3/uL — AB (ref 4.0–10.5)
nRBC: 0.1 % (ref 0.0–0.2)

## 2018-07-10 MED ORDER — ONDANSETRON 4 MG PO TBDP: 4.0000 mg | ORAL_TABLET | Freq: Three times a day (TID) | ORAL | 2 refills | Status: AC | PRN

## 2018-07-10 MED ORDER — HYDROCODONE-ACETAMINOPHEN 5-325 MG PO TABS: 1.0000 | ORAL_TABLET | Freq: Four times a day (QID) | ORAL | 0 refills | Status: DC | PRN

## 2018-07-11 ENCOUNTER — Other Ambulatory Visit: Payer: Self-pay | Admitting: *Deleted

## 2018-07-11 ENCOUNTER — Telehealth: Payer: Self-pay

## 2018-07-11 MED ORDER — HYDROCODONE-ACETAMINOPHEN 5-325 MG PO TABS: 1.0000 | ORAL_TABLET | Freq: Four times a day (QID) | ORAL | 0 refills | Status: DC | PRN

## 2018-07-11 NOTE — Telephone Encounter (Signed)
Hydrocodone did not get sent to pharmacy, it printed.

## 2018-07-11 NOTE — Telephone Encounter (Signed)
Called patient per Dr. Grayland Ormond to let her know that her labs were good so she is to come back in 3 months instead of next week. This appointment had been scheduled and given to patient yesterday at check out. I also told patient that her prescription-Norco, was sent to her pharmacy for pick-up.

## 2018-08-01 IMAGING — CT NM PET TUM IMG RESTAG (PS) SKULL BASE T - THIGH
9 of 11 series · 17 of 25 positions shown · non-contrast
Comparison: Abdomen and pelvis CT from 03/08/2016. Chest abdomen
and pelvis CT from 10/16/2013.

CLINICAL DATA: Subsequent Treatment strategy for CLL. Recent CT
scan revealed likely progression of disease with worsening
lymphadenopathy and splenomegaly.

EXAM:
NUCLEAR MEDICINE PET SKULL BASE TO THIGH
TECHNIQUE: 12.4 mCi F-18 FDG was injected intravenously. Full-ring PET imaging
was performed from the skull base to thigh after the radiotracer. CT
data was obtained and used for attenuation correction and anatomic
localization.
FASTING BLOOD GLUCOSE:  Value: 86 mg/dl

[Series 3: ct wb 5.0 b30f · axial · 5.0mm · 0.98mm/px · z∈[-1462,-594]mm · 2 of 290 slices shown]
[im 1/290]
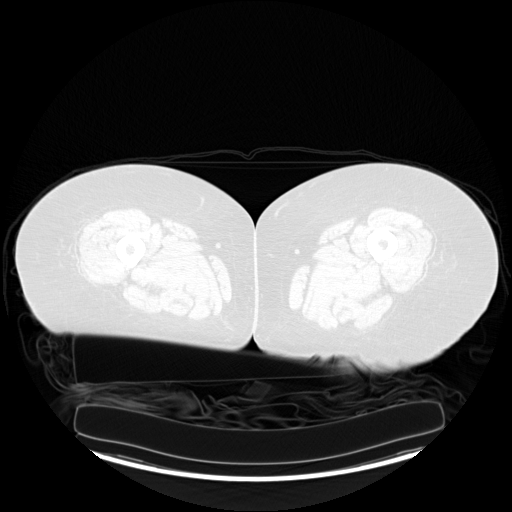
[im 290/290  brain]
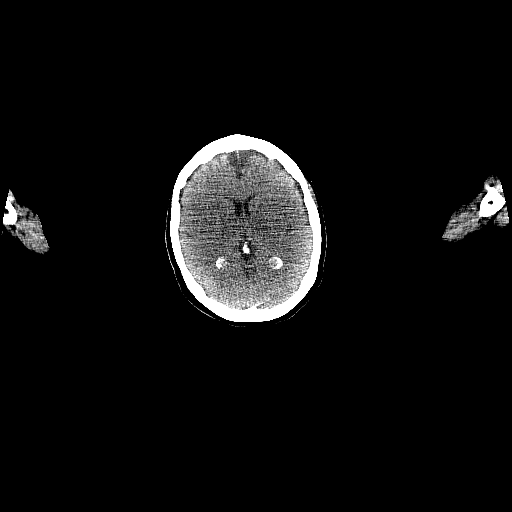

[Series 4: pet wb (ac) · axial · 5.0mm · 4.07mm/px · z∈[-1462,-594]mm · 2 of 290 slices shown]
[im 1/290]
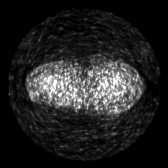
[im 290/290]
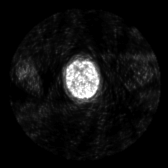

[Series 5: pet wb uncorrected (nac) · axial · 5.0mm · 4.07mm/px · z∈[-1462,-594]mm · 2 of 290 slices shown]
[im 1/290]
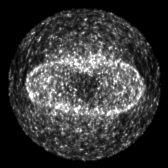
[im 290/290]
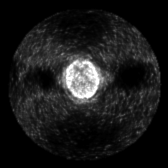

[Series 603: fused axial · 3 of 288 slices shown]
[im 1/288]
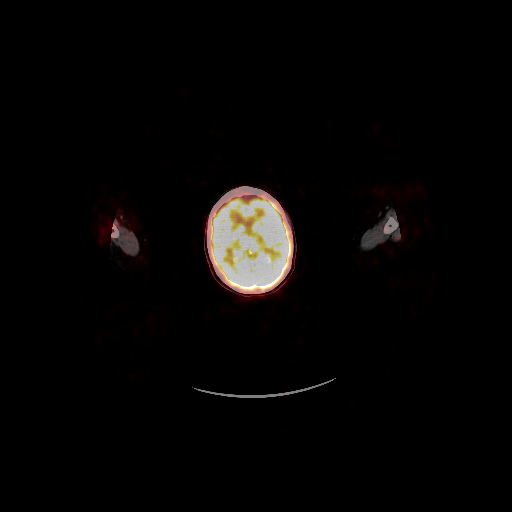
[im 192/288]
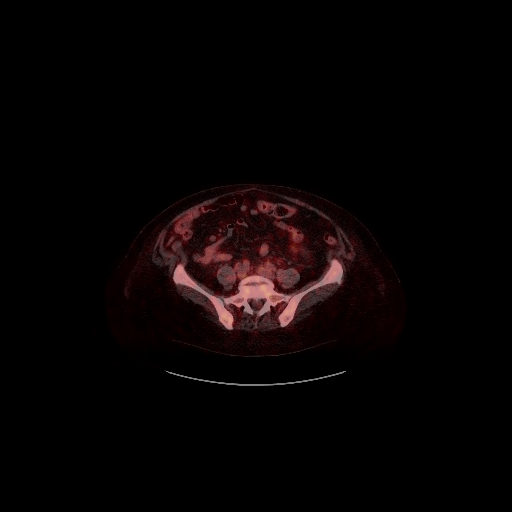
[im 288/288]
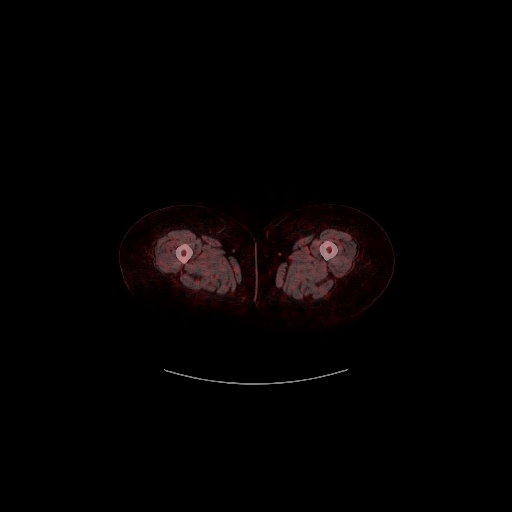

[Series 604: fused coronal · 1 of 96 slices shown]
[im 1/96]
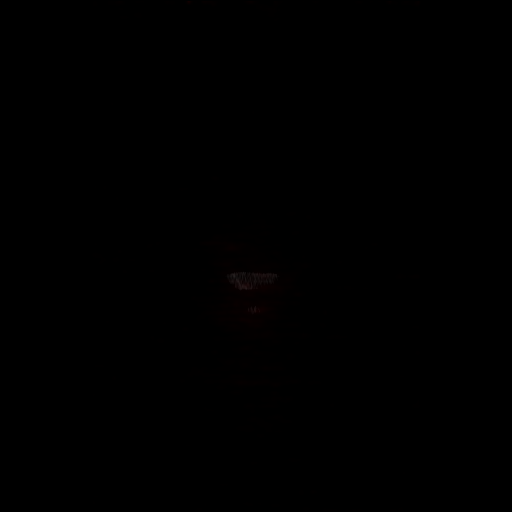

[Series 605: fused sagittal · 1 of 132 slices shown]
[im 132/132]
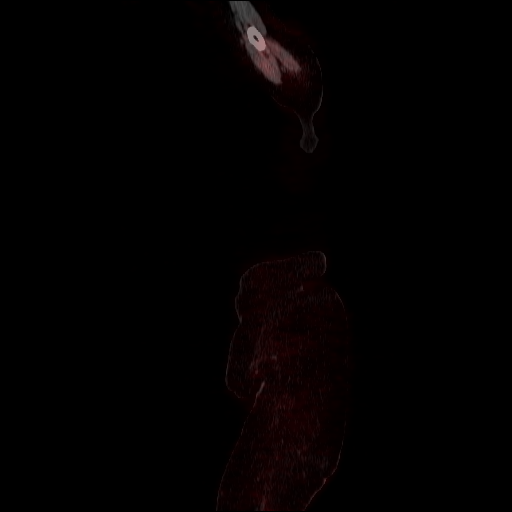

[Series 606: pet axial · 3 of 288 slices shown]
[im 1/288]
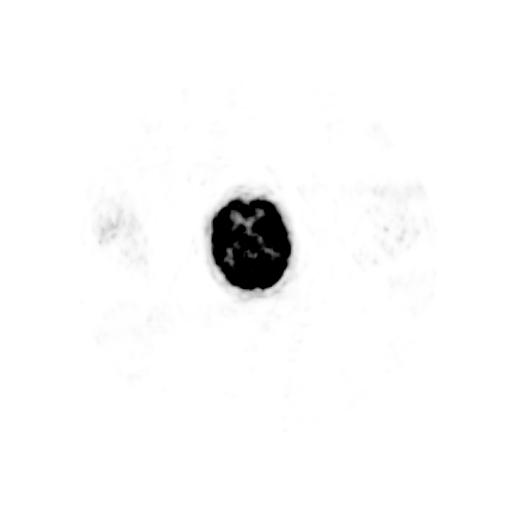
[im 192/288]
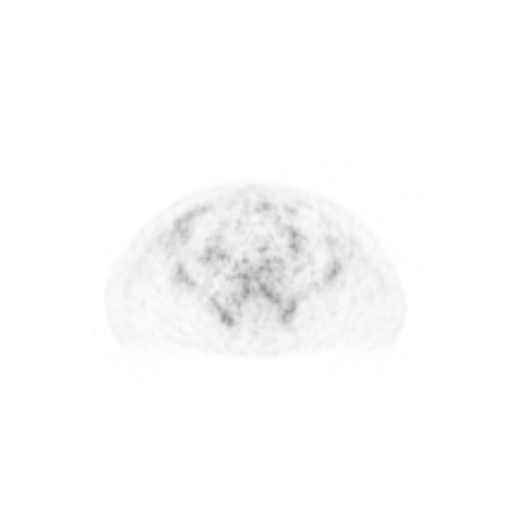
[im 288/288]
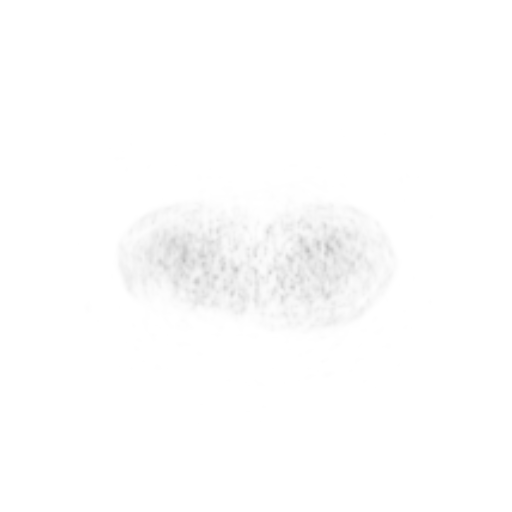

[Series 608: pet sagittal · 2 of 145 slices shown]
[im 1/145]
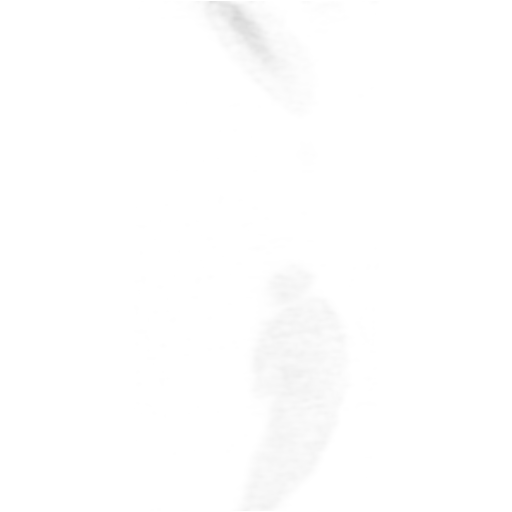
[im 145/145]
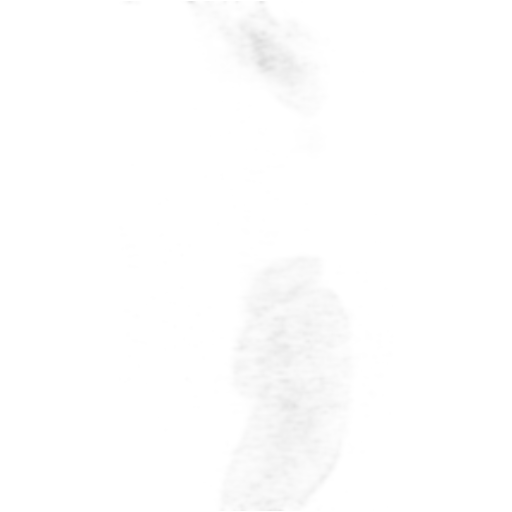

[Series 1112: results mm oncology reading · 0.47mm/px · 1 of 3 slices shown]
[im 1/3]
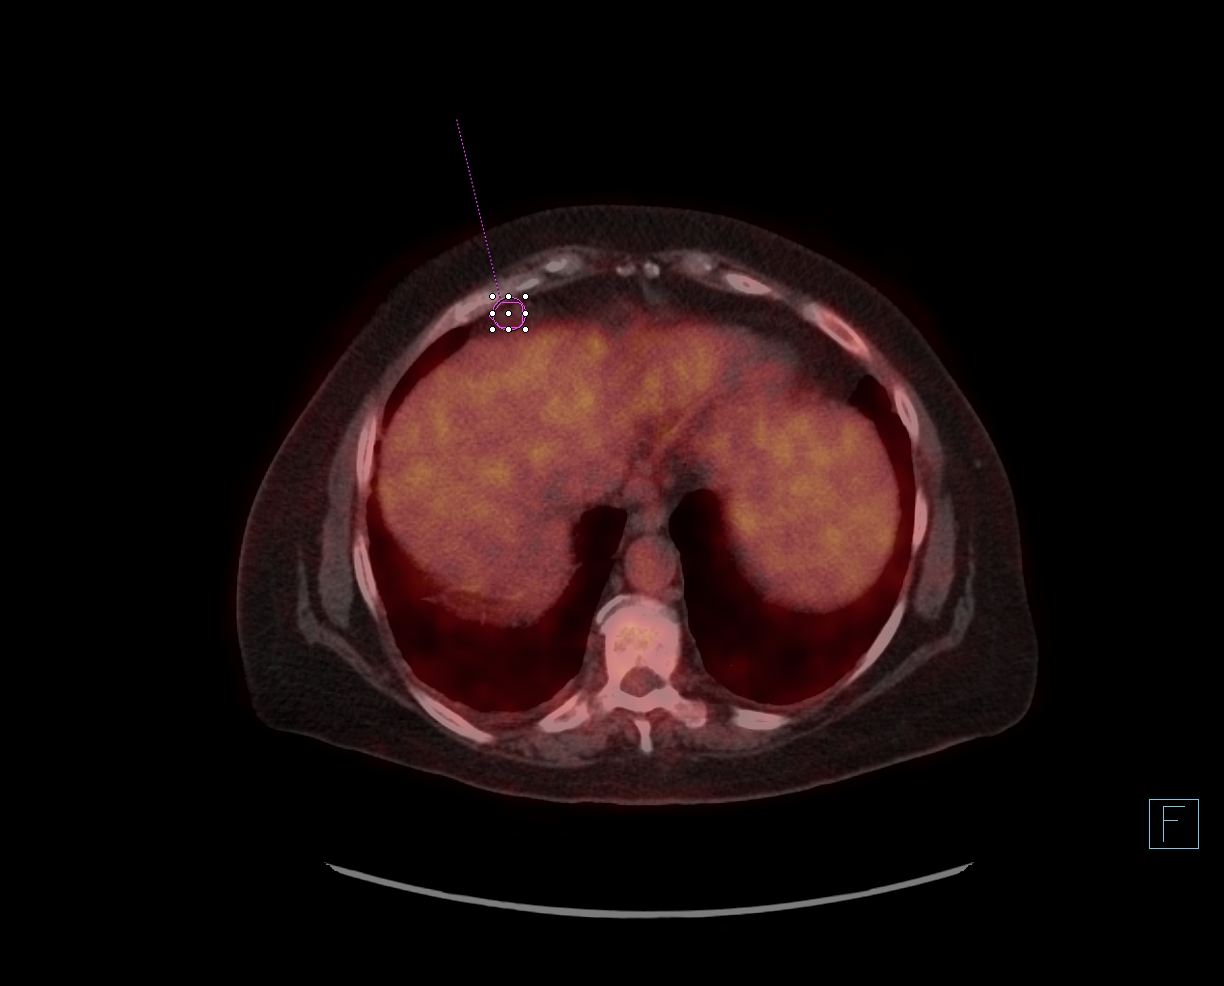

[17 of 25 positions shown; findings below may reference images not displayed]

FINDINGS: NECK

No hypermetabolic lymph nodes in the neck.

Two hypermetabolic nodules are identified in the thyroid. One of
these is in the inferior isthmus measuring about 17 mm. This is
slightly progressed compared to chest CT from 10/16/2013. Inferior
left thyroid nodule is hypermetabolic and appears to be new since
the previous chest CT in 2872.

CHEST

Bilateral borderline enlarged axillary lymph nodes are evident and
are stable since 10/16/2013.

[DATE] mm short axis left axillary lymph node was 8 mm on 10/16/2013. FDG
uptake in these lymph nodes is at background soft tissue levels with
SUV max = 1.9.

15 mm short axis inferior right axillary lymph node was 15 mm on
10/16/2013. This shows low level background uptake with SUV max =
1.5.

17 mm short axis subcarinal lymph node (image 95 series 3) has
increased in size from 6 mm short axis on chest CT of 10/16/2013.
This lesion shows low level FDG uptake, essentially at background
mediastinal levels with SUV max = 2.6.

Anterior right juxta diaphragmatic lymph node (image 118 series 3)
measures 1.3 cm in short axis. This node was 1.1 cm on 03/08/2016
and a 0.5 cm on 10/16/2013. Uptake is at background soft tissue
levels.

1.0 cm Left juxta diaphragmatic lymph node seen on image 126 was
cm on 03/08/2016 and 0.5 cm on 10/16/2013. Uptake in this lymph node
is at background soft tissue levels.

Numerous scattered small and borderline enlarged lymph nodes are
seen elsewhere through the mediastinum.

No suspicious pulmonary nodule or mass.

ABDOMEN/PELVIS

No abnormal hypermetabolic activity within the liver, pancreas, or
adrenal glands.

Multiple enlarged lymph nodes are seen in the upper abdomen, as on
the recent study from 03/08/2016. Hepatoduodenal ligament nodes are
more difficult to measure given the lack of intravenous contrast on
today's study, but portal caval lymph node measured today 17 mm
short axis was 17 mm on 03/08/2016 and 9 mm on 10/16/2013. SUV max =
4.3 today.

15 mm short axis celiac axis lymph node demonstrates SUV max =
with activity below hepatic levels and essentially at background
soft tissue. This lymph node was 15 mm on 03/08/2016 and 8 mm on
10/16/2013.

14 mm short axis right common femoral lymph node is hypermetabolic
at background soft tissue levels ( SUV max = 2.8) and measured 14 mm
on 03/08/2016 and 8 mm on 10/16/2013.

The spleen is enlarged as before and shows diffuse uptake slightly
higher than hepatic parenchyma.

SKELETON

No focal hypermetabolic activity to suggest skeletal metastasis.
IMPRESSION: 1. Interval progression of lymphadenopathy in the mediastinum,
abdomen, and pelvis since 10/16/2013. These lymph nodes show only
very low level FDG accumulation, essentially at background soft
tissue levels.
2. Splenomegaly with diffuse mildly increased FDG accumulation
compared to liver parenchyma.
3. Two hypermetabolic nodules in the thyroid, 1 of which appears new
since 10/16/2013. Thyroid ultrasound recommended to further
evaluate.

## 2018-08-07 ENCOUNTER — Telehealth: Payer: Self-pay | Admitting: Cardiology

## 2018-08-07 ENCOUNTER — Ambulatory Visit (INDEPENDENT_AMBULATORY_CARE_PROVIDER_SITE_OTHER): Payer: Medicare Other | Admitting: *Deleted

## 2018-08-07 VITALS — BP 141/77 | HR 68 | Ht 68.0 in | Wt 256.6 lb

## 2018-08-07 DIAGNOSIS — I4819 Other persistent atrial fibrillation: Secondary | ICD-10-CM | POA: Diagnosis not present

## 2018-08-07 MED ORDER — APIXABAN 5 MG PO TABS: 5.0000 mg | ORAL_TABLET | Freq: Two times a day (BID) | ORAL | 0 refills | Status: DC

## 2018-08-07 MED ORDER — METOPROLOL TARTRATE 25 MG PO TABS: 25.0000 mg | ORAL_TABLET | Freq: Two times a day (BID) | ORAL | 1 refills | Status: DC

## 2018-08-07 NOTE — Progress Notes (Signed)
Presents to office for EKG and vitals. C/O intermittent chest pain rated 1/10. C/O that her heart rate sometimes is in the 140's but says it does come back down in the 60's. Denies dizziness or sob. Patient has taken all doses of medications as prescribed. Denies side effects. Admits to being under a lot of stress related to described as "normal living". Medications reconciled. Sent to provider for review.  Per Dr. Domenic Polite, normal sinus with PVC's. May take an extra 12.5-25 mg metoprolol tartrate daily as needed for palpitations.

## 2018-08-07 NOTE — Telephone Encounter (Signed)
I cld pt regarding symptoms she called in and spoke to [place] about.  Pt stated that she does not think it is afib but is not sure.  Her BP is 118/72 and her HRs are ranging 60s to 70s with no SOB no chest pain.  Pt states that she has not had any of these symptoms in a few days.  Pt would feel much better just having someone listen to her HR.  Roderic Palau' schedule is full so I cld Alma Friendly back to ask for pt to be seen for EKG and BP check.  She will contact patient

## 2018-08-07 NOTE — Patient Instructions (Signed)
Medication Instructions:   Your physician has recommended you make the following change in your medication:   You may take an extra 12.5 mg-25 mg lopressor daily as needed for palpitations.  Continue all other medications the same.   Follow-Up:  As scheduled previously.  Any Other Special Instructions Will Be Listed Below (If Applicable).  If you need a refill on your cardiac medications before your next appointment, please call your pharmacy.

## 2018-08-07 NOTE — Telephone Encounter (Signed)
A-fib clinic contacted for appointment. Per Estill Bamberg, patient will be contacted for triage and appointment arrangement.

## 2018-08-07 NOTE — Telephone Encounter (Signed)
Patient c/o Palpitations:  High priority if patient c/o lightheadedness, shortness of breath, or chest pain  How long have you had palpitations/irregular HR/ Afib? Are you having the symptoms now? Started approximately 1 week ago.    1) Are you currently experiencing lightheadedness, SOB or CP? Fatigue -CP the other night.   2) Do you have a history of afib (atrial fibrillation) or irregular heart rhythm? Yes   3) Have you checked your BP or HR? (document readings if available):   4) Are you experiencing any other symptoms?

## 2018-08-07 NOTE — Telephone Encounter (Signed)
Spoke with patient and she is coming to the office today at 1:00 pm for an EKG.

## 2018-08-07 NOTE — Progress Notes (Signed)
Reviewed with Daisy Black.  Patient's ECG showed sinus rhythm with PACs to clarify.  She was not in atrial fibrillation.  Agree with as needed use of additional metoprolol for palpitations at this time.  As noted previously, she did not tolerate an attempted increase in flecainide.

## 2018-08-12 ENCOUNTER — Other Ambulatory Visit: Payer: Self-pay | Admitting: Gastroenterology

## 2018-08-18 DIAGNOSIS — G5602 Carpal tunnel syndrome, left upper limb: Secondary | ICD-10-CM | POA: Insufficient documentation

## 2018-09-22 ENCOUNTER — Other Ambulatory Visit: Payer: Self-pay

## 2018-10-05 NOTE — Progress Notes (Signed)
[facility]  [phone] [phone]  ID: Daisy Black OB: [birth year]  MR#: [id number]  UDJ#:[id number]  Patient Care Team: Renee Rival, NP as PCP - General (Nurse Practitioner) Satira Sark, MD as PCP - Cardiology (Cardiology) Aviva Signs, MD (General Surgery) Rico Junker, RN as Registered Nurse Theodore Demark, RN as Registered Nurse Satira Sark, MD as Consulting Physician (Cardiology) Gala Romney Cristopher Estimable, MD as Consulting Physician (Gastroenterology)  CHIEF COMPLAINT: CLL  INTERVAL HISTORY: Patient returns to clinic today for further evaluation and repeat laboratory work.  She continues to have chronic weakness and fatigue, but otherwise feels well.  She continues to have intermittent nausea as well as early satiety.  She has no neurologic complaints.  She denies any shortness of breath or chest pain.  She has no fevers, chills, or night sweats.  She denies any vomiting, constipation, or diarrhea.  She has no urinary complaints.  Patient offers no further specific complaints today.  REVIEW OF SYSTEMS:   Review of Systems  Constitutional: Positive for malaise/fatigue. Negative for diaphoresis, fever and weight loss.  Respiratory: Negative.  Negative for cough and shortness of breath.   Cardiovascular: Negative.  Negative for chest pain and leg swelling.  Gastrointestinal: Positive for heartburn and nausea. Negative for abdominal pain, blood in stool, constipation, diarrhea, melena and vomiting.  Genitourinary: Negative.   Musculoskeletal: Positive for back pain.  Skin: Negative.  Negative for rash.  Neurological: Positive for weakness. Negative for sensory change and focal weakness.  Psychiatric/Behavioral: The patient is nervous/anxious.     As per HPI. Otherwise, a complete review of systems is negative.  PAST MEDICAL HISTORY: Past Medical History:  Diagnosis Date  . Anemia   . Arthritis   . CLL (chronic  lymphocytic leukemia) ([place])   . Essential hypertension   . GERD (gastroesophageal reflux disease)   . History of hiatal hernia   . Hyperlipidemia   . Paroxysmal atrial fibrillation ([facility])   . Splenomegaly     PAST SURGICAL HISTORY: Past Surgical History:  Procedure Laterality Date  . ANKLE SURGERY Right    repair fracture  . CARDIOVERSION N/A 03/20/2016   Procedure: CARDIOVERSION;  Surgeon: Satira Sark, MD;  Location: [facility];  Service: Cardiovascular;  Laterality: N/A;  . CARDIOVERSION N/A 05/22/2016   Procedure: CARDIOVERSION;  Surgeon: Satira Sark, MD;  Location: [facility];  Service: Cardiovascular;  Laterality: N/A;  . CHOLECYSTECTOMY    . COLONOSCOPY WITH PROPOFOL N/A 03/27/2018   Internal hemorrhoids, diverticulosis in sigmoid and descending colon, three 4-6 mm polyps at IC valve. tubular adenomas 5 year surveillance  . ESOPHAGOGASTRODUODENOSCOPY (EGD) WITH PROPOFOL N/A 03/27/2018   Moderate Schatzki's ring s/p dilation, small hiatal hernia  . HERNIA REPAIR  2011   Incisional and umbilical utilizing mesh  . MALONEY DILATION N/A 03/27/2018   Procedure: Venia Minks DILATION;  Surgeon: Daneil Dolin, MD;  Location: [facility];  Service: Endoscopy;  Laterality: N/A;  . POLYPECTOMY  03/27/2018   Procedure: POLYPECTOMY;  Surgeon: Daneil Dolin, MD;  Location: [facility];  Service: Endoscopy;;  colon  . ROTATOR CUFF REPAIR Left   . TEE WITHOUT CARDIOVERSION N/A 01/17/2016   Procedure: TRANSESOPHAGEAL ECHOCARDIOGRAM (TEE);  Surgeon: Herminio Commons, MD;  Location: [facility];  Service: Cardiovascular;  Laterality: N/A;  . TEE WITHOUT CARDIOVERSION N/A 02/21/2016   Procedure: TRANSESOPHAGEAL ECHOCARDIOGRAM (TEE) WITH PROPOFOL;  Surgeon: Herminio Commons, MD;  Location: [facility];  Service: Cardiovascular;  Laterality: N/A;  . TEE WITHOUT CARDIOVERSION N/A 03/20/2016   Procedure: TRANSESOPHAGEAL ECHOCARDIOGRAM (TEE) WITH PROPOFOL;  Surgeon: Satira Sark, MD;  Location: [facility];  Service: Cardiovascular;  Laterality: N/A;  . TONSILLECTOMY      FAMILY HISTORY Family History  Problem Relation Age of Onset  . Ovarian cancer Mother 25       Secondary to ovarian cancer  . Leukemia Mother   . Stroke Father 20       Brain stem infarction  . Breast cancer Paternal Aunt   . Crohn's disease Son   . Colon cancer Neg Hx        ADVANCED DIRECTIVES:    HEALTH MAINTENANCE: Social History   Tobacco Use  . Smoking status: Former Smoker    Packs/day: 0.25    Years: 2.00    Pack years: 0.50    Types: Cigarettes    Last attempt to quit: 08/03/1975    Years since quitting: 43.2  . Smokeless tobacco: Never Used  Substance Use Topics  . Alcohol use: No    Alcohol/week: 0.0 standard drinks  . Drug use: No     Colonoscopy:  PAP:  Bone density:  Lipid panel:  No Known Allergies  Current Outpatient Medications  Medication Sig Dispense Refill  . acetaminophen (TYLENOL) 650 MG CR tablet Take 650-1,300 mg by mouth daily as needed for pain.     Marland Kitchen apixaban (ELIQUIS) 5 MG TABS tablet Take 1 tablet (5 mg total) by mouth 2 (two) times daily. 56 tablet 0  . cetirizine (ZYRTEC) 10 MG tablet Take 10 mg by mouth daily as needed for allergies.    . Ferrous Gluconate-C-Folic Acid (IRON-C PO) Take 1 tablet by mouth daily.    . flecainide (TAMBOCOR) 50 MG tablet Take 50 mg by mouth 2 (two) times daily.    . fluticasone (FLONASE) 50 MCG/ACT nasal spray Place 1 spray into both nostrils daily as needed for allergies or rhinitis.    . furosemide (LASIX) 20 MG tablet Take 1 tablet (20 mg total) by mouth daily as needed. (Patient taking differently: Take 20 mg by mouth daily as needed for fluid. ) 30 tablet 6  . HYDROcodone-acetaminophen (NORCO/VICODIN) 5-325 MG tablet Take 1 tablet by mouth every 6 (six) hours as needed for moderate pain. 60 tablet 0  . levothyroxine (SYNTHROID, LEVOTHROID) 125 MCG tablet Take 125 mcg by mouth daily before breakfast.    . metoprolol tartrate  (LOPRESSOR) 25 MG tablet Take 1 tablet (25 mg total) by mouth 2 (two) times daily. May take an extra 12.5-25 mg lopressor daily as needed for palpitations 180 tablet 1  . Multiple Vitamin (MULTIVITAMIN WITH MINERALS) TABS tablet Take 1 tablet by mouth daily.    . pantoprazole (PROTONIX) 40 MG tablet Take 1 tablet (40 mg total) by mouth 2 (two) times daily before a meal. 60 tablet 5  . Melatonin 5 MG SUBL Place 1 tablet under the tongue as needed (for sleep).     No current facility-administered medications for this visit.    Facility-Administered Medications Ordered in Other Visits  Medication Dose Route Frequency Provider Last Rate Last Dose  . hydrocortisone cream 1 % 1 application  1 application Topical TID PRN Satira Sark, MD        OBJECTIVE: Vitals:   10/07/18 1439  BP: (!) 142/80  Pulse: 72  Resp: 18  Temp: (!) 96.9 F (36.1 C)     Body mass index is 40.19 kg/m.  ECOG FS:0 - Asymptomatic  General: Well-developed, well-nourished, no acute distress. Eyes: Pink conjunctiva, anicteric sclera. HEENT: Normocephalic, moist mucous membranes. Lungs: Clear to auscultation bilaterally. Heart: Regular rate and rhythm. No rubs, murmurs, or gallops. Abdomen: Soft, nontender, mild splenomegaly. Musculoskeletal: No edema, cyanosis, or clubbing. Neuro: Alert, answering all questions appropriately. Cranial nerves grossly intact. Skin: No rashes or petechiae noted. Psych: Normal affect. Lymphatics: No cervical, calvicular, axillary or inguinal LAD.  LAB RESULTS:  Lab Results  Component Value Date   NA 141 11/21/2017   K 4.2 11/21/2017   CL 108 11/21/2017   CO2 26 11/21/2017   GLUCOSE 90 11/21/2017   BUN 18 11/21/2017   CREATININE 0.80 07/02/2018   CALCIUM 10.2 11/21/2017   PROT 6.5 11/21/2017   ALBUMIN 4.2 11/21/2017   AST 20 11/21/2017   ALT 23 11/21/2017   ALKPHOS 92 11/21/2017   BILITOT 1.0 11/21/2017   GFRNONAA >60 11/21/2017   GFRAA >60 11/21/2017    Lab  Results  Component Value Date   WBC 55.6 (HH) 10/07/2018   NEUTROABS 3.7 10/07/2018   HGB 13.6 10/07/2018   HCT 42.8 10/07/2018   MCV 97.5 10/07/2018   PLT 116 (L) 10/07/2018     STUDIES: No results found.  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  PLAN:    1. CLL: CT scan results from July 02, 2018 reviewed independently with essentially stable lymphadenopathy, although patient's spleen has increased to 18.5 cm.  Patient white blood cell count continues to slowly trend up and is now 55.6.  Her doubling time is still greater than 6 months, but patient agreed that she likely will require treatment in the near future.  Patient last received single agent Rituxan on May 17, 2016.  No intervention is needed at this time.  Return to clinic in 3 months with repeat laboratory work and further evaluation.  Will reimage if the decision is made to reinitiate treatment. 2.  Early satiety, indigestion: Chronic and unchanged.  Likely secondary to splenomegaly.  3. Atrial fibrillation: Continue monitoring and treatment per cardiology. 4. Iron deficiency anemia: Patient's hemoglobin continues to be within normal limits.  She last received IV Feraheme in May 2017.  5. PET positive thyroid lesions: 1.2 cm right lobe nodule also seen on thyroid ultrasound April 06, 2016.  Continue follow-up with ENT as indicated. 6.  Pain: Patient does not complain of this today. 7.  Nausea: Patient states ondansetron does not work and was given a prescription for Compazine.  Patient expressed understanding and was in agreement with this plan. She also understands that She can call clinic at any time with any questions, concerns, or complaints.     Lloyd Huger, MD   10/10/2018 9:45 AM

## 2018-10-07 ENCOUNTER — Inpatient Hospital Stay (HOSPITAL_BASED_OUTPATIENT_CLINIC_OR_DEPARTMENT_OTHER): Payer: Medicare Other | Admitting: Oncology

## 2018-10-07 ENCOUNTER — Other Ambulatory Visit: Payer: Self-pay

## 2018-10-07 ENCOUNTER — Inpatient Hospital Stay: Payer: Medicare Other | Attending: Oncology | Admitting: *Deleted

## 2018-10-07 VITALS — BP 142/80 | HR 72 | Temp 96.9°F | Resp 18 | Wt 264.3 lb

## 2018-10-07 DIAGNOSIS — R5383 Other fatigue: Secondary | ICD-10-CM | POA: Insufficient documentation

## 2018-10-07 DIAGNOSIS — R6881 Early satiety: Secondary | ICD-10-CM | POA: Diagnosis not present

## 2018-10-07 DIAGNOSIS — Z807 Family history of other malignant neoplasms of lymphoid, hematopoietic and related tissues: Secondary | ICD-10-CM | POA: Insufficient documentation

## 2018-10-07 DIAGNOSIS — Z803 Family history of malignant neoplasm of breast: Secondary | ICD-10-CM | POA: Diagnosis not present

## 2018-10-07 DIAGNOSIS — Z87891 Personal history of nicotine dependence: Secondary | ICD-10-CM | POA: Diagnosis not present

## 2018-10-07 DIAGNOSIS — R5382 Chronic fatigue, unspecified: Secondary | ICD-10-CM | POA: Diagnosis not present

## 2018-10-07 DIAGNOSIS — Z8041 Family history of malignant neoplasm of ovary: Secondary | ICD-10-CM | POA: Insufficient documentation

## 2018-10-07 DIAGNOSIS — C9110 Chronic lymphocytic leukemia of B-cell type not having achieved remission: Secondary | ICD-10-CM | POA: Insufficient documentation

## 2018-10-07 DIAGNOSIS — I4891 Unspecified atrial fibrillation: Secondary | ICD-10-CM | POA: Diagnosis not present

## 2018-10-07 DIAGNOSIS — R110 Nausea: Secondary | ICD-10-CM | POA: Insufficient documentation

## 2018-10-07 LAB — CBC WITH DIFFERENTIAL/PLATELET
Abs Immature Granulocytes: 0.06 10*3/uL (ref 0.00–0.07)
BASOS ABS: 0.2 10*3/uL — AB (ref 0.0–0.1)
Basophils Relative: 0 %
EOS ABS: 0.1 10*3/uL (ref 0.0–0.5)
EOS PCT: 0 %
HEMATOCRIT: 42.8 % (ref 36.0–46.0)
HEMOGLOBIN: 13.6 g/dL (ref 12.0–15.0)
Immature Granulocytes: 0 %
Lymphocytes Relative: 87 %
Lymphs Abs: 48.5 10*3/uL — ABNORMAL HIGH (ref 0.7–4.0)
MCH: 31 pg (ref 26.0–34.0)
MCHC: 31.8 g/dL (ref 30.0–36.0)
MCV: 97.5 fL (ref 80.0–100.0)
MONO ABS: 3.1 10*3/uL — AB (ref 0.1–1.0)
Monocytes Relative: 6 %
NRBC: 0 % (ref 0.0–0.2)
Neutro Abs: 3.7 10*3/uL (ref 1.7–7.7)
Neutrophils Relative %: 7 %
Platelets: 116 10*3/uL — ABNORMAL LOW (ref 150–400)
RBC Morphology: NORMAL
RBC: 4.39 MIL/uL (ref 3.87–5.11)
RDW: 14.3 % (ref 11.5–15.5)
Smear Review: NORMAL
WBC: 55.6 10*3/uL — AB (ref 4.0–10.5)

## 2018-10-07 NOTE — Progress Notes (Signed)
Patient states she was prescribed ondansetron the last time she was here.  She states it does not work well for her  She is requesting promethazine instead.

## 2018-10-10 ENCOUNTER — Telehealth: Payer: Self-pay | Admitting: *Deleted

## 2018-10-10 MED ORDER — PROCHLORPERAZINE MALEATE 10 MG PO TABS: 10.0000 mg | ORAL_TABLET | Freq: Four times a day (QID) | ORAL | 2 refills | Status: DC | PRN

## 2018-10-10 MED ORDER — PROMETHAZINE HCL 25 MG PO TABS: 25.0000 mg | ORAL_TABLET | Freq: Four times a day (QID) | ORAL | 0 refills | Status: DC | PRN

## 2018-10-10 NOTE — Telephone Encounter (Signed)
Patient requests Phenergan in place of COmpazine

## 2018-10-22 ENCOUNTER — Ambulatory Visit: Payer: Medicare Other | Admitting: Gastroenterology

## 2018-10-29 ENCOUNTER — Other Ambulatory Visit: Payer: Self-pay

## 2018-10-29 MED ORDER — FUROSEMIDE 20 MG PO TABS: 20.0000 mg | ORAL_TABLET | Freq: Every day | ORAL | 6 refills | Status: DC | PRN

## 2018-10-29 NOTE — Telephone Encounter (Signed)
Refilled lasix per fax request 

## 2019-01-01 ENCOUNTER — Encounter: Payer: Self-pay | Admitting: Cardiology

## 2019-01-01 ENCOUNTER — Telehealth (INDEPENDENT_AMBULATORY_CARE_PROVIDER_SITE_OTHER): Payer: Medicare Other | Admitting: Cardiology

## 2019-01-01 VITALS — BP 117/69 | Ht 68.0 in | Wt 255.0 lb

## 2019-01-01 DIAGNOSIS — Z7189 Other specified counseling: Secondary | ICD-10-CM | POA: Diagnosis not present

## 2019-01-01 DIAGNOSIS — I10 Essential (primary) hypertension: Secondary | ICD-10-CM

## 2019-01-01 DIAGNOSIS — I4819 Other persistent atrial fibrillation: Secondary | ICD-10-CM

## 2019-01-01 NOTE — Progress Notes (Signed)
Virtual Visit via Video Note   This visit type was conducted due to national recommendations for restrictions regarding the COVID-19 Pandemic (e.g. social distancing) in an effort to limit this patient's exposure and mitigate transmission in our community.  Due to her co-morbid illnesses, this patient is at least at moderate risk for complications without adequate follow up.  This format is felt to be most appropriate for this patient at this time.  All issues noted in this document were discussed and addressed.  A limited physical exam was performed with this format.  Please refer to the patient's chart for her consent to telehealth for [facility].   Date:  01/01/2019   ID:  Daisy Black, DOB May 12, 1951, MRN [id number]  Patient Location: Home Provider Location: Office  PCP:  Renee Rival, NP  Cardiologist:  Rozann Lesches, MD Electrophysiologist:  None   Evaluation Performed:  Follow-Up Visit  Chief Complaint:   Cardiac follow-up  History of Present Illness:    Daisy Black is a 68 y.o. female last seen in November 2019.  We communicated via video conferencing today.  She states that she has had only a few brief episodes of palpitations for which she has taken an extra metoprolol.  Fortunately, no prolonged symptoms or increasing shortness of breath.  I reviewed her current medical regimen which is listed below.  She reports no bleeding problems on Eliquis.  She reports compliance with Tambocor and Lopressor.  The patient does not have symptoms concerning for COVID-19 infection (fever, chills, cough, or new shortness of breath).    Past Medical History:  Diagnosis Date  . Anemia   . Arthritis   . CLL (chronic lymphocytic leukemia) ([place])   . Essential hypertension   . GERD (gastroesophageal reflux disease)   . History of hiatal hernia   . Hyperlipidemia   . Paroxysmal atrial fibrillation (HCC)   . Splenomegaly    Past Surgical History:  Procedure  Laterality Date  . ANKLE SURGERY Right    repair fracture  . CARDIOVERSION N/A 03/20/2016   Procedure: CARDIOVERSION;  Surgeon: Satira Sark, MD;  Location: [facility];  Service: Cardiovascular;  Laterality: N/A;  . CARDIOVERSION N/A 05/22/2016   Procedure: CARDIOVERSION;  Surgeon: Satira Sark, MD;  Location: [facility];  Service: Cardiovascular;  Laterality: N/A;  . CHOLECYSTECTOMY    . COLONOSCOPY WITH PROPOFOL N/A 03/27/2018   Internal hemorrhoids, diverticulosis in sigmoid and descending colon, three 4-6 mm polyps at IC valve. tubular adenomas 5 year surveillance  . ESOPHAGOGASTRODUODENOSCOPY (EGD) WITH PROPOFOL N/A 03/27/2018   Moderate Schatzki's ring s/p dilation, small hiatal hernia  . HERNIA REPAIR  2011   Incisional and umbilical utilizing mesh  . MALONEY DILATION N/A 03/27/2018   Procedure: Venia Minks DILATION;  Surgeon: Daneil Dolin, MD;  [facility];  Service: Endoscopy;  Laterality: N/A;  . POLYPECTOMY  03/27/2018   Procedure: POLYPECTOMY;  Surgeon: Daneil Dolin, MD;  [facility];  Service: Endoscopy;;  colon  . ROTATOR CUFF REPAIR Left   . TEE WITHOUT CARDIOVERSION N/A 01/17/2016   Procedure: TRANSESOPHAGEAL ECHOCARDIOGRAM (TEE);  Surgeon: Herminio Commons, MD;  Location: [facility];  Service: Cardiovascular;  Laterality: N/A;  . TEE WITHOUT CARDIOVERSION N/A 02/21/2016   Procedure: TRANSESOPHAGEAL ECHOCARDIOGRAM (TEE) WITH PROPOFOL;  Surgeon: Herminio Commons, MD;  Location: [facility];  Service: Cardiovascular;  Laterality: N/A;  . TEE WITHOUT CARDIOVERSION N/A 03/20/2016   Procedure: TRANSESOPHAGEAL ECHOCARDIOGRAM (TEE) WITH PROPOFOL;  Surgeon: Satira Sark, MD;  Location: [facility];  Service: Cardiovascular;  Laterality: N/A;  . TONSILLECTOMY       Current Meds  Medication Sig  . acetaminophen (TYLENOL) 650 MG CR tablet Take 650-1,300 mg by mouth daily as needed for pain.   Marland Kitchen apixaban (ELIQUIS) 5 MG TABS tablet Take 1 tablet (5 mg total) by  mouth 2 (two) times daily.  . cetirizine (ZYRTEC) 10 MG tablet Take 10 mg by mouth daily as needed for allergies.  . Ferrous Gluconate-C-Folic Acid (IRON-C PO) Take 1 tablet by mouth daily.  . flecainide (TAMBOCOR) 50 MG tablet Take 50 mg by mouth 2 (two) times daily.  . fluticasone (FLONASE) 50 MCG/ACT nasal spray Place 1 spray into both nostrils daily as needed for allergies or rhinitis.  . furosemide (LASIX) 20 MG tablet Take 1 tablet (20 mg total) by mouth daily as needed.  Marland Kitchen HYDROcodone-acetaminophen (NORCO/VICODIN) 5-325 MG tablet Take 1 tablet by mouth every 6 (six) hours as needed for moderate pain.  Marland Kitchen levothyroxine (SYNTHROID, LEVOTHROID) 125 MCG tablet Take 125 mcg by mouth daily before breakfast.  . Melatonin 5 MG SUBL Place 1 tablet under the tongue as needed (for sleep).  . metoprolol tartrate (LOPRESSOR) 25 MG tablet Take 1 tablet (25 mg total) by mouth 2 (two) times daily. May take an extra 12.5-25 mg lopressor daily as needed for palpitations  . Multiple Vitamin (MULTIVITAMIN WITH MINERALS) TABS tablet Take 1 tablet by mouth daily.  . pantoprazole (PROTONIX) 40 MG tablet Take 1 tablet (40 mg total) by mouth 2 (two) times daily before a meal. (Patient taking differently: Take 40 mg by mouth daily. )  . prochlorperazine (COMPAZINE) 10 MG tablet Take 1 tablet (10 mg total) by mouth every 6 (six) hours as needed for nausea or vomiting.  . promethazine (PHENERGAN) 25 MG tablet Take 1 tablet (25 mg total) by mouth every 6 (six) hours as needed for nausea or vomiting.     Allergies:   Patient has no known allergies.   Social History   Tobacco Use  . Smoking status: Former Smoker    Packs/day: 0.25    Years: 2.00    Pack years: 0.50    Types: Cigarettes    Last attempt to quit: 08/03/1975    Years since quitting: 43.4  . Smokeless tobacco: Never Used  Substance Use Topics  . Alcohol use: No    Alcohol/week: 0.0 standard drinks  . Drug use: No     Family Hx: The patient's  family history includes Breast cancer in her paternal aunt; Crohn's disease in her son; Leukemia in her mother; Ovarian cancer (age of onset: 67) in her mother; Stroke (age of onset: 34) in her father. There is no history of Colon cancer.  ROS:   Please see the history of present illness. All other systems reviewed and are negative.   Prior CV studies:   The following studies were reviewed today:  TEE 03/20/2016: Study Conclusions  - Left ventricle: Systolic function was normal. The estimated ejection fraction was in the range of 60% to 65%. Wall motion was normal; there were no regional wall motion abnormalities. No evidence of thrombus. - Descending aorta: The descending aorta had minor luminal irregularities. - Mitral valve: There was mild regurgitation. - Left atrium: The atrium was mildly dilated. No evidence of thrombus in the atrial cavity or appendage. Prominent trabeculation visualized at tip of appendage. Emptying velocity was moderately reduced. - Right atrium: No evidence of  thrombus in the atrial cavity or appendage. - Tricuspid valve: There was mild regurgitation. - Pericardium, extracardiac: There was no pericardial effusion.  Impressions:  - LVEF 60-65% without wall motion abnormalities. No left atrial appendage thrombus visualized. There is prominent trabeculation noted at the appendage tip. Emptying velocity was moderately reduced. No right atrial appendage thrombus noted. Mild tricuspid regurgitation.  Labs/Other Tests and Data Reviewed:    EKG:  An ECG dated 08/07/2018 was personally reviewed today and demonstrated:  Sinus rhythm with PACs.  Recent Labs: 07/02/2018: Creatinine, Ser 0.80 10/07/2018: Hemoglobin 13.6; Platelets 116    Wt Readings from Last 3 Encounters:  01/01/19 255 lb (115.7 kg)  10/07/18 264 lb 5 oz (119.9 kg)  08/07/18 256 lb 9.6 oz (116.4 kg)     Objective:    Vital Signs:  BP 117/69   Ht 5\' 8"  (1.727  m)   Wt 255 lb (115.7 kg)   BMI 38.77 kg/m      ASSESSMENT & PLAN:    1.  Paroxysmal to persistent atrial fibrillation.  She is doing well, has maintained sinus rhythm on combination of flecainide, Lopressor, and Eliquis.  No changes to current regimen.  2.  Essential hypertension, blood pressure is well controlled based on today's measurement.  3.  History of symptomatic bradycardia, tolerating present regimen without difficulties.  COVID-19 Education: The signs and symptoms of COVID-19 were discussed with the patient and how to seek care for testing (follow up with PCP or arrange E-visit).  The importance of social distancing was discussed today.  Time:   Today, I have spent 6 minutes with the patient with telehealth technology discussing the above problems.     Medication Adjustments/Labs and Tests Ordered: Current medicines are reviewed at length with the patient today.  Concerns regarding medicines are outlined above.   Tests Ordered: No orders of the defined types were placed in this encounter.   Medication Changes: No orders of the defined types were placed in this encounter.   Disposition:  Follow up 6 months in the [place] office.  Signed, Rozann Lesches, MD  01/01/2019 2:24 PM    [place]

## 2019-01-01 NOTE — Patient Instructions (Addendum)

## 2019-01-08 ENCOUNTER — Ambulatory Visit: Payer: Medicare Other | Admitting: Oncology

## 2019-01-08 ENCOUNTER — Other Ambulatory Visit: Payer: Medicare Other

## 2019-01-11 NOTE — Progress Notes (Deleted)
Daisy Black  [phone] [phone]  ID: [name] OB: [birth year]  MR#: [id number]  NIO#:[id number]  Patient Care Team: Renee Rival, NP as PCP - General (Nurse Practitioner) Satira Sark, MD as PCP - Cardiology (Cardiology) Aviva Signs, MD (General Surgery) Rico Junker, RN as Registered Nurse Theodore Demark, RN as Registered Nurse Satira Sark, MD as Consulting Physician (Cardiology) Gala Romney Cristopher Estimable, MD as Consulting Physician (Gastroenterology)  CHIEF COMPLAINT: CLL  INTERVAL HISTORY: Patient returns to clinic today for further evaluation and repeat laboratory work.  She continues to have chronic weakness and fatigue, but otherwise feels well.  She continues to have intermittent nausea as well as early satiety.  She has no neurologic complaints.  She denies any shortness of breath or chest pain.  She has no fevers, chills, or night sweats.  She denies any vomiting, constipation, or diarrhea.  She has no urinary complaints.  Patient offers no further specific complaints today.  REVIEW OF SYSTEMS:   Review of Systems  Constitutional: Positive for malaise/fatigue. Negative for diaphoresis, fever and weight loss.  Respiratory: Negative.  Negative for cough and shortness of breath.   Cardiovascular: Negative.  Negative for chest pain and leg swelling.  Gastrointestinal: Positive for heartburn and nausea. Negative for abdominal pain, blood in stool, constipation, diarrhea, melena and vomiting.  Genitourinary: Negative.   Musculoskeletal: Positive for back pain.  Skin: Negative.  Negative for rash.  Neurological: Positive for weakness. Negative for sensory change and focal weakness.  Psychiatric/Behavioral: The patient is nervous/anxious.     As per HPI. Otherwise, a complete review of systems is negative.  PAST MEDICAL HISTORY: Past Medical History:  Diagnosis Date  . Anemia   . Arthritis   . CLL (chronic  lymphocytic leukemia) ([place])   . Essential hypertension   . GERD (gastroesophageal reflux disease)   . History of hiatal hernia   . Hyperlipidemia   . Paroxysmal atrial fibrillation (H[facility])   . Splenomegaly     PAST SURGICAL HISTORY: Past Surgical History:  Procedure Laterality Date  . ANKLE SURGERY Right    repair fracture  . CARDIOVERSION N/A 03/20/2016   Procedure: CARDIOVERSION;  Surgeon: Satira Sark, MD;  [facility];  Service: Cardiovascular;  Laterality: N/A;  . CARDIOVERSION N/A 05/22/2016   Procedure: CARDIOVERSION;  Surgeon: Satira Sark, MD;  Location: [facility];  Service: Cardiovascular;  Laterality: N/A;  . CHOLECYSTECTOMY    . COLONOSCOPY WITH PROPOFOL N/A 03/27/2018   Internal hemorrhoids, diverticulosis in sigmoid and descending colon, three 4-6 mm polyps at IC valve. tubular adenomas 5 year surveillance  . ESOPHAGOGASTRODUODENOSCOPY (EGD) WITH PROPOFOL N/A 03/27/2018   Moderate Schatzki's ring s/p dilation, small hiatal hernia  . HERNIA REPAIR  2011   Incisional and umbilical utilizing mesh  . MALONEY DILATION N/A 03/27/2018   Procedure: Venia Minks DILATION;  Surgeon: Daneil Dolin, MD;  [facility];  Service: Endoscopy;  Laterality: N/A;  . POLYPECTOMY  03/27/2018   Procedure: POLYPECTOMY;  Surgeon: Daneil Dolin, MD;  [facility];  Service: Endoscopy;;  colon  . ROTATOR CUFF REPAIR Left   . TEE WITHOUT CARDIOVERSION N/A 01/17/2016   Procedure: TRANSESOPHAGEAL ECHOCARDIOGRAM (TEE);  Surgeon: Herminio Commons, MD;  [facility]: [facility];  Service: Cardiovascular;  Laterality: N/A;  . TEE WITHOUT CARDIOVERSION N/A 02/21/2016   Procedure: TRANSESOPHAGEAL ECHOCARDIOGRAM (TEE) WITH PROPOFOL;  Surgeon: Herminio Commons, MD;  [facility]: [facility];  Service: Cardiovascular;  Laterality: N/A;  . TEE WITHOUT CARDIOVERSION N/A 03/20/2016   Procedure: TRANSESOPHAGEAL ECHOCARDIOGRAM (TEE) WITH PROPOFOL;  Surgeon: Satira Sark, MD;  Location: [facility];  Service: Cardiovascular;  Laterality: N/A;  . TONSILLECTOMY      FAMILY HISTORY Family History  Problem Relation Age of Onset  . Ovarian cancer Mother 43       Secondary to ovarian cancer  . Leukemia Mother   . Stroke Father 77       Brain stem infarction  . Breast cancer Paternal Aunt   . Crohn's disease Son   . Colon cancer Neg Hx        ADVANCED DIRECTIVES:    HEALTH MAINTENANCE: Social History   Tobacco Use  . Smoking status: Former Smoker    Packs/day: 0.25    Years: 2.00    Pack years: 0.50    Types: Cigarettes    Quit date: 08/03/1975    Years since quitting: 43.4  . Smokeless tobacco: Never Used  Substance Use Topics  . Alcohol use: No    Alcohol/week: 0.0 standard drinks  . Drug use: No     Colonoscopy:  PAP:  Bone density:  Lipid panel:  No Known Allergies  Current Outpatient Medications  Medication Sig Dispense Refill  . acetaminophen (TYLENOL) 650 MG CR tablet Take 650-1,300 mg by mouth daily as needed for pain.     Marland Kitchen apixaban (ELIQUIS) 5 MG TABS tablet Take 1 tablet (5 mg total) by mouth 2 (two) times daily. 56 tablet 0  . cetirizine (ZYRTEC) 10 MG tablet Take 10 mg by mouth daily as needed for allergies.    . Ferrous Gluconate-C-Folic Acid (IRON-C PO) Take 1 tablet by mouth daily.    . flecainide (TAMBOCOR) 50 MG tablet Take 50 mg by mouth 2 (two) times daily.    . fluticasone (FLONASE) 50 MCG/ACT nasal spray Place 1 spray into both nostrils daily as needed for allergies or rhinitis.    . furosemide (LASIX) 20 MG tablet Take 1 tablet (20 mg total) by mouth daily as needed. 30 tablet 6  . HYDROcodone-acetaminophen (NORCO/VICODIN) 5-325 MG tablet Take 1 tablet by mouth every 6 (six) hours as needed for moderate pain. 60 tablet 0  . levothyroxine (SYNTHROID, LEVOTHROID) 125 MCG tablet Take 125 mcg by mouth daily before breakfast.    . Melatonin 5 MG SUBL Place 1 tablet under the tongue as needed (for sleep).    . metoprolol tartrate (LOPRESSOR)  25 MG tablet Take 1 tablet (25 mg total) by mouth 2 (two) times daily. May take an extra 12.5-25 mg lopressor daily as needed for palpitations 180 tablet 1  . Multiple Vitamin (MULTIVITAMIN WITH MINERALS) TABS tablet Take 1 tablet by mouth daily.    . pantoprazole (PROTONIX) 40 MG tablet Take 1 tablet (40 mg total) by mouth 2 (two) times daily before a meal. (Patient taking differently: Take 40 mg by mouth daily. ) 60 tablet 5  . prochlorperazine (COMPAZINE) 10 MG tablet Take 1 tablet (10 mg total) by mouth every 6 (six) hours as needed for nausea or vomiting. 60 tablet 2  . promethazine (PHENERGAN) 25 MG tablet Take 1 tablet (25 mg total) by mouth every 6 (six) hours as needed for nausea or vomiting. 60 tablet 0   No current facility-administered medications for this visit.    Facility-Administered Medications Ordered in Other Visits  Medication Dose Route Frequency Provider Last Rate Last Dose  . hydrocortisone cream 1 % 1 application  1 application Topical TID PRN Satira Sark, MD        OBJECTIVE: There were no vitals filed for this visit.   There is no height or weight on file to calculate BMI.    ECOG FS:0 - Asymptomatic  General: Well-developed, well-nourished, no acute distress. Eyes: Pink conjunctiva, anicteric sclera. HEENT: Normocephalic, moist mucous membranes. Lungs: Clear to auscultation bilaterally. Heart: Regular rate and rhythm. No rubs, murmurs, or gallops. Abdomen: Soft, nontender, mild splenomegaly. Musculoskeletal: No edema, cyanosis, or clubbing. Neuro: Alert, answering all questions appropriately. Cranial nerves grossly intact. Skin: No rashes or petechiae noted. Psych: Normal affect. Lymphatics: No cervical, calvicular, axillary or inguinal LAD.  LAB RESULTS:  Lab Results  Component Value Date   NA 141 11/21/2017   K 4.2 11/21/2017   CL 108 11/21/2017   CO2 26 11/21/2017   GLUCOSE 90 11/21/2017   BUN 18 11/21/2017   CREATININE 0.80 07/02/2018    CALCIUM 10.2 11/21/2017   PROT 6.5 11/21/2017   ALBUMIN 4.2 11/21/2017   AST 20 11/21/2017   ALT 23 11/21/2017   ALKPHOS 92 11/21/2017   BILITOT 1.0 11/21/2017   GFRNONAA >60 11/21/2017   GFRAA >60 11/21/2017    Lab Results  Component Value Date   WBC 55.6 (HH) 10/07/2018   NEUTROABS 3.7 10/07/2018   HGB 13.6 10/07/2018   HCT 42.8 10/07/2018   MCV 97.5 10/07/2018   PLT 116 (L) 10/07/2018     STUDIES: No results found.  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  PLAN:    1. CLL: CT scan results from July 02, 2018 reviewed independently with essentially stable lymphadenopathy, although patient's spleen has increased to 18.5 cm.  Patient white blood cell count continues to slowly trend up and is now 55.6.  Her doubling time is still greater than 6 months, but patient agreed that she likely will require treatment in the near future.  Patient last received single agent Rituxan on May 17, 2016.  No intervention is needed at this time.  Return to clinic in 3 months with repeat laboratory work and further evaluation.  Will reimage if the decision is made to reinitiate treatment. 2.  Early satiety, indigestion: Chronic and unchanged.  Likely secondary to splenomegaly.  3. Atrial fibrillation: Continue monitoring and treatment per cardiology. 4. Iron deficiency anemia: Patient's hemoglobin continues to be within normal limits.  She last received IV Feraheme in May 2017.  5. PET positive thyroid lesions: 1.2 cm right lobe nodule also seen on thyroid ultrasound April 06, 2016.  Continue follow-up with ENT as indicated. 6.  Pain: Patient does not complain of this today. 7.  Nausea: Patient states ondansetron does not work and was given a prescription for Compazine.  Patient expressed understanding and was in agreement with this plan. She also understands that She can call clinic at any time with any questions, concerns, or complaints.     Lloyd Huger, MD   01/11/2019 11:16 AM

## 2019-01-14 ENCOUNTER — Other Ambulatory Visit: Payer: Self-pay

## 2019-01-15 ENCOUNTER — Inpatient Hospital Stay: Payer: Medicare Other | Admitting: Oncology

## 2019-01-15 ENCOUNTER — Inpatient Hospital Stay: Payer: Medicare Other

## 2019-01-31 NOTE — Progress Notes (Signed)
[place]  [phone] [phone]) [phone]  ID: Daisy Black OB: 06/30/1951  MR#: [id number]  QAS#:[id number]  Patient Care Team: Renee Rival, NP as PCP - General (Nurse Practitioner) Satira Sark, MD as PCP - Cardiology (Cardiology) Aviva Signs, MD (General Surgery) Rico Junker, RN as Registered Nurse Theodore Demark, RN as Registered Nurse Satira Sark, MD as Consulting Physician (Cardiology) Gala Romney Cristopher Estimable, MD as Consulting Physician (Gastroenterology)  CHIEF COMPLAINT: CLL  INTERVAL HISTORY: Patient returns to clinic today for repeat laboratory work and further evaluation.  She has worsening weakness and fatigue.  She continues to have early satiety and occasional left flank pain secondary to her splenomegaly.  She has no neurologic complaints.  She denies any fevers, chills, or night sweats.  She has no chest pain, shortness of breath, cough, or hemoptysis. She denies any vomiting, constipation, or diarrhea.  She has no urinary complaints.  Patient offers no further specific complaints today.  REVIEW OF SYSTEMS:   Review of Systems  Constitutional: Positive for malaise/fatigue. Negative for diaphoresis, fever and weight loss.  Respiratory: Negative.  Negative for cough and shortness of breath.   Cardiovascular: Negative.  Negative for chest pain and leg swelling.  Gastrointestinal: Positive for heartburn and nausea. Negative for abdominal pain, blood in stool, constipation, diarrhea, melena and vomiting.  Genitourinary: Positive for flank pain.  Musculoskeletal: Negative for back pain.  Skin: Negative.  Negative for rash.  Neurological: Positive for weakness. Negative for dizziness, sensory change, focal weakness and headaches.  Psychiatric/Behavioral: Negative.  The patient is not nervous/anxious.     As per HPI. Otherwise, a complete review of systems is negative.  PAST MEDICAL HISTORY: Past Medical History:   Diagnosis Date  . Anemia   . Arthritis   . CLL (chronic lymphocytic leukemia) ([place])   . Essential hypertension   . GERD (gastroesophageal reflux disease)   . History of hiatal hernia   . Hyperlipidemia   . Paroxysmal atrial fibrillation ([facility])   . Splenomegaly     PAST SURGICAL HISTORY: Past Surgical History:  Procedure Laterality Date  . ANKLE SURGERY Right    repair fracture  . CARDIOVERSION N/A 03/20/2016   Procedure: CARDIOVERSION;  Surgeon: Satira Sark, MD;  Location: [facility];  Service: Cardiovascular;  Laterality: N/A;  . CARDIOVERSION N/A 05/22/2016   Procedure: CARDIOVERSION;  Surgeon: Satira Sark, MD;  Location: [facility];  Service: Cardiovascular;  Laterality: N/A;  . CHOLECYSTECTOMY    . COLONOSCOPY WITH PROPOFOL N/A 03/27/2018   Internal hemorrhoids, diverticulosis in sigmoid and descending colon, three 4-6 mm polyps at IC valve. tubular adenomas 5 year surveillance  . ESOPHAGOGASTRODUODENOSCOPY (EGD) WITH PROPOFOL N/A 03/27/2018   Moderate Schatzki's ring s/p dilation, small hiatal hernia  . HERNIA REPAIR  2011   Incisional and umbilical utilizing mesh  . MALONEY DILATION N/A 03/27/2018   Procedure: Venia Minks DILATION;  Surgeon: Daneil Dolin, MD;  [facility];  Service: Endoscopy;  Laterality: N/A;  . POLYPECTOMY  03/27/2018   Procedure: POLYPECTOMY;  Surgeon: Daneil Dolin, MD;  [facility];  Service: Endoscopy;;  colon  . ROTATOR CUFF REPAIR Left   . TEE WITHOUT CARDIOVERSION N/A 01/17/2016   Procedure: TRANSESOPHAGEAL ECHOCARDIOGRAM (TEE);  Surgeon: Herminio Commons, MD;  Location: [facility];  Service: Cardiovascular;  Laterality: N/A;  . TEE WITHOUT CARDIOVERSION N/A 02/21/2016   Procedure: TRANSESOPHAGEAL ECHOCARDIOGRAM (TEE) WITH PROPOFOL;  Surgeon: Herminio Commons, MD;  [facility];  Service: Cardiovascular;  Laterality: N/A;  . TEE WITHOUT CARDIOVERSION N/A 03/20/2016   Procedure: TRANSESOPHAGEAL ECHOCARDIOGRAM (TEE) WITH  PROPOFOL;  Surgeon: Satira Sark, MD;  Location: [facility];  Service: Cardiovascular;  Laterality: N/A;  . TONSILLECTOMY      FAMILY HISTORY Family History  Problem Relation Age of Onset  . Ovarian cancer Mother 14       Secondary to ovarian cancer  . Leukemia Mother   . Stroke Father 61       Brain stem infarction  . Breast cancer Paternal Aunt   . Crohn's disease Son   . Colon cancer Neg Hx        ADVANCED DIRECTIVES:    HEALTH MAINTENANCE: Social History   Tobacco Use  . Smoking status: Former Smoker    Packs/day: 0.25    Years: 2.00    Pack years: 0.50    Types: Cigarettes    Quit date: 08/03/1975    Years since quitting: 43.5  . Smokeless tobacco: Never Used  Substance Use Topics  . Alcohol use: No    Alcohol/week: 0.0 standard drinks  . Drug use: No     Colonoscopy:  PAP:  Bone density:  Lipid panel:  No Known Allergies  Current Outpatient Medications  Medication Sig Dispense Refill  . acetaminophen (TYLENOL) 650 MG CR tablet Take 650-1,300 mg by mouth daily as needed for pain.     Marland Kitchen apixaban (ELIQUIS) 5 MG TABS tablet Take 1 tablet (5 mg total) by mouth 2 (two) times daily. 56 tablet 0  . cetirizine (ZYRTEC) 10 MG tablet Take 10 mg by mouth daily as needed for allergies.    . Ferrous Gluconate-C-Folic Acid (IRON-C PO) Take 1 tablet by mouth daily.    . flecainide (TAMBOCOR) 50 MG tablet Take 50 mg by mouth 2 (two) times daily.    . fluticasone (FLONASE) 50 MCG/ACT nasal spray Place 1 spray into both nostrils daily as needed for allergies or rhinitis.    . furosemide (LASIX) 20 MG tablet Take 1 tablet (20 mg total) by mouth daily as needed. 30 tablet 6  . HYDROcodone-acetaminophen (NORCO/VICODIN) 5-325 MG tablet Take 1 tablet by mouth every 6 (six) hours as needed for moderate pain. 60 tablet 0  . levothyroxine (SYNTHROID, LEVOTHROID) 125 MCG tablet Take 125 mcg by mouth daily before breakfast.    . Melatonin 5 MG SUBL Place 1 tablet under the tongue as  needed (for sleep).    . metoprolol tartrate (LOPRESSOR) 25 MG tablet Take 1 tablet (25 mg total) by mouth 2 (two) times daily. May take an extra 12.5-25 mg lopressor daily as needed for palpitations 180 tablet 1  . Multiple Vitamin (MULTIVITAMIN WITH MINERALS) TABS tablet Take 1 tablet by mouth daily.    . pantoprazole (PROTONIX) 40 MG tablet Take 1 tablet (40 mg total) by mouth 2 (two) times daily before a meal. (Patient taking differently: Take 40 mg by mouth daily. ) 60 tablet 5  . prochlorperazine (COMPAZINE) 10 MG tablet Take 1 tablet (10 mg total) by mouth every 6 (six) hours as needed for nausea or vomiting. 60 tablet 2  . promethazine (PHENERGAN) 25 MG tablet Take 1 tablet (25 mg total) by mouth every 6 (six) hours as needed for nausea or vomiting. 60 tablet 0   No current facility-administered medications for this visit.    Facility-Administered Medications Ordered in Other Visits  Medication Dose Route Frequency Provider Last Rate Last Dose  . hydrocortisone  cream 1 % 1 application  1 application Topical TID PRN Satira Sark, MD        OBJECTIVE: Vitals:   02/06/19 1122  BP: 135/88  Pulse: 76  Temp: (!) 96.7 F (35.9 C)     Body mass index is 36.19 kg/m.    ECOG FS:1 - Symptomatic but completely ambulatory  General: Well-developed, well-nourished, no acute distress. Eyes: Pink conjunctiva, anicteric sclera. HEENT: Normocephalic, moist mucous membranes, clear oropharnyx. Lungs: Clear to auscultation bilaterally. Heart: Regular rate and rhythm. No rubs, murmurs, or gallops. Abdomen: Soft, nontender, nondistended. No organomegaly noted, normoactive bowel sounds. Musculoskeletal: No edema, cyanosis, or clubbing. Neuro: Alert, answering all questions appropriately. Cranial nerves grossly intact. Skin: No rashes or petechiae noted. Psych: Normal affect. Lymphatics: No cervical, calvicular, axillary or inguinal LAD.  LAB RESULTS:  Lab Results  Component Value Date    NA 141 11/21/2017   K 4.2 11/21/2017   CL 108 11/21/2017   CO2 26 11/21/2017   GLUCOSE 90 11/21/2017   BUN 18 11/21/2017   CREATININE 0.80 07/02/2018   CALCIUM 10.2 11/21/2017   PROT 6.5 11/21/2017   ALBUMIN 4.2 11/21/2017   AST 20 11/21/2017   ALT 23 11/21/2017   ALKPHOS 92 11/21/2017   BILITOT 1.0 11/21/2017   GFRNONAA >60 11/21/2017   GFRAA >60 11/21/2017    Lab Results  Component Value Date   WBC 77.3 (HH) 02/06/2019   NEUTROABS 6.1 02/06/2019   HGB 13.6 02/06/2019   HCT 44.3 02/06/2019   MCV 99.8 02/06/2019   PLT 98 (L) 02/06/2019     STUDIES: No results found.  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  PLAN:    1. CLL: CT scan results from July 02, 2018 reviewed independently with essentially stable lymphadenopathy, although patient's spleen has increased to 18.5 cm.  Patient's white blood cell count continues to trend up and is now 77.3.  She also has progressive thrombocytopenia of 98.  She has a decreased performance status and increasing symptoms, therefore recommended reinitiating Rituxan.  Patient declined treatment at this time, but did acknowledge that she will have to initiate treatment in the near future.  She last received single agent Rituxan on May 17, 2016.  Return to clinic in 6 weeks with repeat laboratory work and further evaluation.  Will reimage if the decision is made to reinitiate treatment. 2.  Early satiety, indigestion: Chronic and unchanged.  Likely secondary to splenomegaly.  3. Atrial fibrillation: Continue monitoring and treatment per cardiology. 4. Iron deficiency anemia: Patient's hemoglobin continues to be within normal limits.  She last received IV Feraheme in May 2017.  5. PET positive thyroid lesions: 1.2 cm right lobe nodule also seen on thyroid ultrasound April 06, 2016.  Continue follow-up with ENT as indicated. 6.  Pain: Patient does not complain of this today. 7.  Nausea: Continue  Compazine as needed.  Patient expressed understanding and was in agreement with this plan. She also understands that She can call clinic at any time with any questions, concerns, or complaints.     Lloyd Huger, MD   02/08/2019 9:27 AM

## 2019-02-06 ENCOUNTER — Encounter: Payer: Self-pay | Admitting: Oncology

## 2019-02-06 ENCOUNTER — Inpatient Hospital Stay (HOSPITAL_BASED_OUTPATIENT_CLINIC_OR_DEPARTMENT_OTHER): Payer: Medicare Other | Admitting: Oncology

## 2019-02-06 ENCOUNTER — Inpatient Hospital Stay: Payer: Medicare Other | Attending: Oncology

## 2019-02-06 ENCOUNTER — Other Ambulatory Visit: Payer: Self-pay

## 2019-02-06 VITALS — BP 135/88 | HR 76 | Temp 96.7°F | Wt 238.0 lb

## 2019-02-06 DIAGNOSIS — I4891 Unspecified atrial fibrillation: Secondary | ICD-10-CM | POA: Diagnosis not present

## 2019-02-06 DIAGNOSIS — C9110 Chronic lymphocytic leukemia of B-cell type not having achieved remission: Secondary | ICD-10-CM | POA: Insufficient documentation

## 2019-02-06 DIAGNOSIS — I10 Essential (primary) hypertension: Secondary | ICD-10-CM | POA: Diagnosis not present

## 2019-02-06 DIAGNOSIS — R5383 Other fatigue: Secondary | ICD-10-CM | POA: Diagnosis not present

## 2019-02-06 DIAGNOSIS — R110 Nausea: Secondary | ICD-10-CM | POA: Insufficient documentation

## 2019-02-06 DIAGNOSIS — Z87891 Personal history of nicotine dependence: Secondary | ICD-10-CM | POA: Diagnosis not present

## 2019-02-06 LAB — CBC WITH DIFFERENTIAL/PLATELET
Abs Immature Granulocytes: 0.14 10*3/uL — ABNORMAL HIGH (ref 0.00–0.07)
Basophils Absolute: 0.4 10*3/uL — ABNORMAL HIGH (ref 0.0–0.1)
Basophils Relative: 1 %
Eosinophils Absolute: 0.1 10*3/uL (ref 0.0–0.5)
Eosinophils Relative: 0 %
HCT: 44.3 % (ref 36.0–46.0)
Hemoglobin: 13.6 g/dL (ref 12.0–15.0)
Immature Granulocytes: 0 %
Lymphocytes Relative: 88 %
Lymphs Abs: 68.5 10*3/uL — ABNORMAL HIGH (ref 0.7–4.0)
MCH: 30.6 pg (ref 26.0–34.0)
MCHC: 30.7 g/dL (ref 30.0–36.0)
MCV: 99.8 fL (ref 80.0–100.0)
Monocytes Absolute: 2.1 10*3/uL — ABNORMAL HIGH (ref 0.1–1.0)
Monocytes Relative: 3 %
Neutro Abs: 6.1 10*3/uL (ref 1.7–7.7)
Neutrophils Relative %: 8 %
Platelets: 98 10*3/uL — ABNORMAL LOW (ref 150–400)
RBC: 4.44 MIL/uL (ref 3.87–5.11)
RDW: 14.4 % (ref 11.5–15.5)
Smear Review: NORMAL
WBC Morphology: ABNORMAL
WBC: 77.3 10*3/uL (ref 4.0–10.5)
nRBC: 0 % (ref 0.0–0.2)

## 2019-02-06 MED ORDER — PROCHLORPERAZINE MALEATE 10 MG PO TABS: 10.0000 mg | ORAL_TABLET | Freq: Four times a day (QID) | ORAL | 2 refills | Status: DC | PRN

## 2019-02-06 MED ORDER — HYDROCODONE-ACETAMINOPHEN 5-325 MG PO TABS: 1.0000 | ORAL_TABLET | Freq: Four times a day (QID) | ORAL | 0 refills | Status: DC | PRN

## 2019-02-06 NOTE — Progress Notes (Signed)
Patient stated that she has had some abdominal pain due to her splenomegaly and she also feels tired and fatigued.

## 2019-03-13 NOTE — Progress Notes (Deleted)
[place]  [phone]) [phone] [phone]) [phone]  ID: Daisy Black OB: May 10, 1951  MR#: [id number]  OXB#:[id number]  Patient Care Team: Renee Rival, NP as PCP - General (Nurse Practitioner) Satira Sark, MD as PCP - Cardiology (Cardiology) Aviva Signs, MD (General Surgery) Rico Junker, RN as Registered Nurse Theodore Demark, RN as Registered Nurse Satira Sark, MD as Consulting Physician (Cardiology) Gala Romney Cristopher Estimable, MD as Consulting Physician (Gastroenterology)  CHIEF COMPLAINT: CLL  INTERVAL HISTORY: Patient returns to clinic today for repeat laboratory work and further evaluation.  She has worsening weakness and fatigue.  She continues to have early satiety and occasional left flank pain secondary to her splenomegaly.  She has no neurologic complaints.  She denies any fevers, chills, or night sweats.  She has no chest pain, shortness of breath, cough, or hemoptysis. She denies any vomiting, constipation, or diarrhea.  She has no urinary complaints.  Patient offers no further specific complaints today.  REVIEW OF SYSTEMS:   Review of Systems  Constitutional: Positive for malaise/fatigue. Negative for diaphoresis, fever and weight loss.  Respiratory: Negative.  Negative for cough and shortness of breath.   Cardiovascular: Negative.  Negative for chest pain and leg swelling.  Gastrointestinal: Positive for heartburn and nausea. Negative for abdominal pain, blood in stool, constipation, diarrhea, melena and vomiting.  Genitourinary: Positive for flank pain.  Musculoskeletal: Negative for back pain.  Skin: Negative.  Negative for rash.  Neurological: Positive for weakness. Negative for dizziness, sensory change, focal weakness and headaches.  Psychiatric/Behavioral: Negative.  The patient is not nervous/anxious.     As per HPI. Otherwise, a complete review of systems is negative.  PAST MEDICAL HISTORY: Past Medical History:   Diagnosis Date  . Anemia   . Arthritis   . CLL (chronic lymphocytic leukemia) ([place])   . Essential hypertension   . GERD (gastroesophageal reflux disease)   . History of hiatal hernia   . Hyperlipidemia   . Paroxysmal atrial fibrillation ([facility])   . Splenomegaly     PAST SURGICAL HISTORY: Past Surgical History:  Procedure Laterality Date  . ANKLE SURGERY Right    repair fracture  . CARDIOVERSION N/A 03/20/2016   Procedure: CARDIOVERSION;  Surgeon: Satira Sark, MD;  Location: [facility];  Service: Cardiovascular;  Laterality: N/A;  . CARDIOVERSION N/A 05/22/2016   Procedure: CARDIOVERSION;  Surgeon: Satira Sark, MD;  Location: [facility];  Service: Cardiovascular;  Laterality: N/A;  . CHOLECYSTECTOMY    . COLONOSCOPY WITH PROPOFOL N/A 03/27/2018   Internal hemorrhoids, diverticulosis in sigmoid and descending colon, three 4-6 mm polyps at IC valve. tubular adenomas 5 year surveillance  . ESOPHAGOGASTRODUODENOSCOPY (EGD) WITH PROPOFOL N/A 03/27/2018   Moderate Schatzki's ring s/p dilation, small hiatal hernia  . HERNIA REPAIR  2011   Incisional and umbilical utilizing mesh  . MALONEY DILATION N/A 03/27/2018   Procedure: Venia Minks DILATION;  Surgeon: Daneil Dolin, MD;  [facility];  Service: Endoscopy;  Laterality: N/A;  . POLYPECTOMY  03/27/2018   Procedure: POLYPECTOMY;  Surgeon: Daneil Dolin, MD;  [facility];  Service: Endoscopy;;  colon  . ROTATOR CUFF REPAIR Left   . TEE WITHOUT CARDIOVERSION N/A 01/17/2016   Procedure: TRANSESOPHAGEAL ECHOCARDIOGRAM (TEE);  Surgeon: Herminio Commons, MD;  Location: [facility];  Service: Cardiovascular;  Laterality: N/A;  . TEE WITHOUT CARDIOVERSION N/A 02/21/2016   Procedure: TRANSESOPHAGEAL ECHOCARDIOGRAM (TEE) WITH PROPOFOL;  Surgeon: Herminio Commons, MD;  [facility];  Service: Cardiovascular;  Laterality: N/A;  . TEE WITHOUT CARDIOVERSION N/A 03/20/2016   Procedure: TRANSESOPHAGEAL ECHOCARDIOGRAM (TEE) WITH  PROPOFOL;  Surgeon: Satira Sark, MD;  Location: [facility];  Service: Cardiovascular;  Laterality: N/A;  . TONSILLECTOMY      FAMILY HISTORY Family History  Problem Relation Age of Onset  . Ovarian cancer Mother 75       Secondary to ovarian cancer  . Leukemia Mother   . Stroke Father 58       Brain stem infarction  . Breast cancer Paternal Aunt   . Crohn's disease Son   . Colon cancer Neg Hx        ADVANCED DIRECTIVES:    HEALTH MAINTENANCE: Social History   Tobacco Use  . Smoking status: Former Smoker    Packs/day: 0.25    Years: 2.00    Pack years: 0.50    Types: Cigarettes    Quit date: 08/03/1975    Years since quitting: 43.6  . Smokeless tobacco: Never Used  Substance Use Topics  . Alcohol use: No    Alcohol/week: 0.0 standard drinks  . Drug use: No     Colonoscopy:  PAP:  Bone density:  Lipid panel:  No Known Allergies  Current Outpatient Medications  Medication Sig Dispense Refill  . acetaminophen (TYLENOL) 650 MG CR tablet Take 650-1,300 mg by mouth daily as needed for pain.     Marland Kitchen apixaban (ELIQUIS) 5 MG TABS tablet Take 1 tablet (5 mg total) by mouth 2 (two) times daily. 56 tablet 0  . cetirizine (ZYRTEC) 10 MG tablet Take 10 mg by mouth daily as needed for allergies.    . Ferrous Gluconate-C-Folic Acid (IRON-C PO) Take 1 tablet by mouth daily.    . flecainide (TAMBOCOR) 50 MG tablet Take 50 mg by mouth 2 (two) times daily.    . fluticasone (FLONASE) 50 MCG/ACT nasal spray Place 1 spray into both nostrils daily as needed for allergies or rhinitis.    . furosemide (LASIX) 20 MG tablet Take 1 tablet (20 mg total) by mouth daily as needed. 30 tablet 6  . HYDROcodone-acetaminophen (NORCO/VICODIN) 5-325 MG tablet Take 1 tablet by mouth every 6 (six) hours as needed for moderate pain. 60 tablet 0  . levothyroxine (SYNTHROID, LEVOTHROID) 125 MCG tablet Take 125 mcg by mouth daily before breakfast.    . Melatonin 5 MG SUBL Place 1 tablet under the tongue as  needed (for sleep).    . metoprolol tartrate (LOPRESSOR) 25 MG tablet Take 1 tablet (25 mg total) by mouth 2 (two) times daily. May take an extra 12.5-25 mg lopressor daily as needed for palpitations 180 tablet 1  . Multiple Vitamin (MULTIVITAMIN WITH MINERALS) TABS tablet Take 1 tablet by mouth daily.    . pantoprazole (PROTONIX) 40 MG tablet Take 1 tablet (40 mg total) by mouth 2 (two) times daily before a meal. (Patient taking differently: Take 40 mg by mouth daily. ) 60 tablet 5  . prochlorperazine (COMPAZINE) 10 MG tablet Take 1 tablet (10 mg total) by mouth every 6 (six) hours as needed for nausea or vomiting. 60 tablet 2  . promethazine (PHENERGAN) 25 MG tablet Take 1 tablet (25 mg total) by mouth every 6 (six) hours as needed for nausea or vomiting. 60 tablet 0   No current facility-administered medications for this visit.    Facility-Administered Medications Ordered in Other Visits  Medication Dose Route Frequency Provider Last Rate Last Dose  . hydrocortisone  cream 1 % 1 application  1 application Topical TID PRN Satira Sark, MD        OBJECTIVE: There were no vitals filed for this visit.   There is no height or weight on file to calculate BMI.    ECOG FS:1 - Symptomatic but completely ambulatory  General: Well-developed, well-nourished, no acute distress. Eyes: Pink conjunctiva, anicteric sclera. HEENT: Normocephalic, moist mucous membranes, clear oropharnyx. Lungs: Clear to auscultation bilaterally. Heart: Regular rate and rhythm. No rubs, murmurs, or gallops. Abdomen: Soft, nontender, nondistended. No organomegaly noted, normoactive bowel sounds. Musculoskeletal: No edema, cyanosis, or clubbing. Neuro: Alert, answering all questions appropriately. Cranial nerves grossly intact. Skin: No rashes or petechiae noted. Psych: Normal affect. Lymphatics: No cervical, calvicular, axillary or inguinal LAD.  LAB RESULTS:  Lab Results  Component Value Date   NA 141 11/21/2017    K 4.2 11/21/2017   CL 108 11/21/2017   CO2 26 11/21/2017   GLUCOSE 90 11/21/2017   BUN 18 11/21/2017   CREATININE 0.80 07/02/2018   CALCIUM 10.2 11/21/2017   PROT 6.5 11/21/2017   ALBUMIN 4.2 11/21/2017   AST 20 11/21/2017   ALT 23 11/21/2017   ALKPHOS 92 11/21/2017   BILITOT 1.0 11/21/2017   GFRNONAA >60 11/21/2017   GFRAA >60 11/21/2017    Lab Results  Component Value Date   WBC 77.3 (HH) 02/06/2019   NEUTROABS 6.1 02/06/2019   HGB 13.6 02/06/2019   HCT 44.3 02/06/2019   MCV 99.8 02/06/2019   PLT 98 (L) 02/06/2019     STUDIES: No results found.  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  PLAN:    1. CLL: CT scan results from July 02, 2018 reviewed independently with essentially stable lymphadenopathy, although patient's spleen has increased to 18.5 cm.  Patient's white blood cell count continues to trend up and is now 77.3.  She also has progressive thrombocytopenia of 98.  She has a decreased performance status and increasing symptoms, therefore recommended reinitiating Rituxan.  Patient declined treatment at this time, but did acknowledge that she will have to initiate treatment in the near future.  She last received single agent Rituxan on May 17, 2016.  Return to clinic in 6 weeks with repeat laboratory work and further evaluation.  Will reimage if the decision is made to reinitiate treatment. 2.  Early satiety, indigestion: Chronic and unchanged.  Likely secondary to splenomegaly.  3. Atrial fibrillation: Continue monitoring and treatment per cardiology. 4. Iron deficiency anemia: Patient's hemoglobin continues to be within normal limits.  She last received IV Feraheme in May 2017.  5. PET positive thyroid lesions: 1.2 cm right lobe nodule also seen on thyroid ultrasound April 06, 2016.  Continue follow-up with ENT as indicated. 6.  Pain: Patient does not complain of this today. 7.  Nausea: Continue Compazine as needed.   Patient expressed understanding and was in agreement with this plan. She also understands that She can call clinic at any time with any questions, concerns, or complaints.     Lloyd Huger, MD   03/13/2019 4:23 PM

## 2019-03-20 ENCOUNTER — Inpatient Hospital Stay: Payer: Medicare Other

## 2019-03-20 ENCOUNTER — Encounter: Payer: Self-pay | Admitting: Oncology

## 2019-03-20 ENCOUNTER — Inpatient Hospital Stay: Payer: Medicare Other | Admitting: Oncology

## 2019-03-28 NOTE — Progress Notes (Signed)
Daisy Black  [phone] [phone]) [phone]  ID: Daisy Black OB: September 19, 1950  MR#: [id number]  [id number]  Patient Care Team: Renee Rival, NP as PCP - General (Nurse Practitioner) Satira Sark, MD as PCP - Cardiology (Cardiology) Aviva Signs, MD (General Surgery) Rico Junker, RN as Registered Nurse Theodore Demark, RN as Registered Nurse Satira Sark, MD as Consulting Physician (Cardiology) Gala Romney Cristopher Estimable, MD as Consulting Physician (Gastroenterology)  CHIEF COMPLAINT: CLL  INTERVAL HISTORY: Patient returns to clinic today for repeat laboratory can further evaluation.  She continues to have persistent weakness and fatigue, early satiety and mild left flank pain.  She has no neurologic complaints.  She denies any fevers, chills, or night sweats.  She has no chest pain, shortness of breath, cough, or hemoptysis. She denies any vomiting, constipation, or diarrhea.  She has no urinary complaints.  Patient offers no further specific complaints today.  REVIEW OF SYSTEMS:   Review of Systems  Constitutional: Positive for malaise/fatigue. Negative for diaphoresis, fever and weight loss.  Respiratory: Negative.  Negative for cough and shortness of breath.   Cardiovascular: Negative.  Negative for chest pain and leg swelling.  Gastrointestinal: Positive for heartburn and nausea. Negative for abdominal pain, blood in stool, constipation, diarrhea, melena and vomiting.  Genitourinary: Positive for flank pain.  Musculoskeletal: Negative for back pain.  Skin: Negative.  Negative for rash.  Neurological: Positive for weakness. Negative for dizziness, sensory change, focal weakness and headaches.  Psychiatric/Behavioral: Negative.  The patient is not nervous/anxious.     As per HPI. Otherwise, a complete review of systems is negative.  PAST MEDICAL HISTORY: Past Medical History:  Diagnosis Date  . Anemia   . Arthritis   . CLL  (chronic lymphocytic leukemia) ([place])   . Essential hypertension   . GERD (gastroesophageal reflux disease)   . History of hiatal hernia   . Hyperlipidemia   . Paroxysmal atrial fibrillation ([facility])   . Splenomegaly     PAST SURGICAL HISTORY: Past Surgical History:  Procedure Laterality Date  . ANKLE SURGERY Right    repair fracture  . CARDIOVERSION N/A 03/20/2016   Procedure: CARDIOVERSION;  Surgeon: Satira Sark, MD;  Location: [facility];  Service: Cardiovascular;  Laterality: N/A;  . CARDIOVERSION N/A 05/22/2016   Procedure: CARDIOVERSION;  Surgeon: Satira Sark, MD;  Location: [facility];  Service: Cardiovascular;  Laterality: N/A;  . CHOLECYSTECTOMY    . COLONOSCOPY WITH PROPOFOL N/A 03/27/2018   Internal hemorrhoids, diverticulosis in sigmoid and descending colon, three 4-6 mm polyps at IC valve. tubular adenomas 5 year surveillance  . ESOPHAGOGASTRODUODENOSCOPY (EGD) WITH PROPOFOL N/A 03/27/2018   Moderate Schatzki's ring s/p dilation, small hiatal hernia  . HERNIA REPAIR  2011   Incisional and umbilical utilizing mesh  . MALONEY DILATION N/A 03/27/2018   Procedure: Venia Minks DILATION;  Surgeon: Daneil Dolin, MD;  [facility];  Service: Endoscopy;  Laterality: N/A;  . POLYPECTOMY  03/27/2018   Procedure: POLYPECTOMY;  Surgeon: Daneil Dolin, MD;  [facility];  Service: Endoscopy;;  colon  . ROTATOR CUFF REPAIR Left   . TEE WITHOUT CARDIOVERSION N/A 01/17/2016   Procedure: TRANSESOPHAGEAL ECHOCARDIOGRAM (TEE);  Surgeon: Herminio Commons, MD;  Location: [facility];  Service: Cardiovascular;  Laterality: N/A;  . TEE WITHOUT CARDIOVERSION N/A 02/21/2016   Procedure: TRANSESOPHAGEAL ECHOCARDIOGRAM (TEE) WITH PROPOFOL;  Surgeon: Herminio Commons, MD;  Location: [facility];  Service: Cardiovascular;  Laterality: N/A;  .  TEE WITHOUT CARDIOVERSION N/A 03/20/2016   Procedure: TRANSESOPHAGEAL ECHOCARDIOGRAM (TEE) WITH PROPOFOL;  Surgeon: Satira Sark, MD;   Location: [facility];  Service: Cardiovascular;  Laterality: N/A;  . TONSILLECTOMY      FAMILY HISTORY Family History  Problem Relation Age of Onset  . Ovarian cancer Mother 35       Secondary to ovarian cancer  . Leukemia Mother   . Stroke Father 73       Brain stem infarction  . Breast cancer Paternal Aunt   . Crohn's disease Son   . Colon cancer Neg Hx        ADVANCED DIRECTIVES:    HEALTH MAINTENANCE: Social History   Tobacco Use  . Smoking status: Former Smoker    Packs/day: 0.25    Years: 2.00    Pack years: 0.50    Types: Cigarettes    Quit date: 08/03/1975    Years since quitting: 43.6  . Smokeless tobacco: Never Used  Substance Use Topics  . Alcohol use: No    Alcohol/week: 0.0 standard drinks  . Drug use: No     Colonoscopy:  PAP:  Bone density:  Lipid panel:  No Known Allergies  Current Outpatient Medications  Medication Sig Dispense Refill  . acetaminophen (TYLENOL) 650 MG CR tablet Take 650-1,300 mg by mouth daily as needed for pain.     Marland Kitchen apixaban (ELIQUIS) 5 MG TABS tablet Take 1 tablet (5 mg total) by mouth 2 (two) times daily. 56 tablet 0  . cetirizine (ZYRTEC) 10 MG tablet Take 10 mg by mouth daily as needed for allergies.    . Ferrous Gluconate-C-Folic Acid (IRON-C PO) Take 1 tablet by mouth daily.    . flecainide (TAMBOCOR) 50 MG tablet Take 50 mg by mouth 2 (two) times daily.    . fluticasone (FLONASE) 50 MCG/ACT nasal spray Place 1 spray into both nostrils daily as needed for allergies or rhinitis.    . furosemide (LASIX) 20 MG tablet Take 1 tablet (20 mg total) by mouth daily as needed. 30 tablet 6  . HYDROcodone-acetaminophen (NORCO/VICODIN) 5-325 MG tablet Take 1 tablet by mouth every 6 (six) hours as needed for moderate pain. 60 tablet 0  . levothyroxine (SYNTHROID, LEVOTHROID) 125 MCG tablet Take 125 mcg by mouth daily before breakfast.    . Melatonin 5 MG SUBL Place 1 tablet under the tongue as needed (for sleep).    . metoprolol  tartrate (LOPRESSOR) 25 MG tablet Take 1 tablet (25 mg total) by mouth 2 (two) times daily. May take an extra 12.5-25 mg lopressor daily as needed for palpitations 180 tablet 1  . Multiple Vitamin (MULTIVITAMIN WITH MINERALS) TABS tablet Take 1 tablet by mouth daily.    . pantoprazole (PROTONIX) 40 MG tablet Take 1 tablet (40 mg total) by mouth 2 (two) times daily before a meal. (Patient taking differently: Take 40 mg by mouth daily. ) 60 tablet 5  . prochlorperazine (COMPAZINE) 10 MG tablet Take 1 tablet (10 mg total) by mouth every 6 (six) hours as needed for nausea or vomiting. 60 tablet 2  . promethazine (PHENERGAN) 25 MG tablet Take 1 tablet (25 mg total) by mouth every 6 (six) hours as needed for nausea or vomiting. 60 tablet 0   No current facility-administered medications for this visit.    Facility-Administered Medications Ordered in Other Visits  Medication Dose Route Frequency Provider Last Rate Last Dose  . hydrocortisone cream 1 % 1 application  1 application Topical TID  PRN Satira Sark, MD        OBJECTIVE: Vitals:   04/01/19 1013  BP: 115/85  Pulse: (!) 55  Temp: 98.3 F (36.8 C)     Body mass index is 40.57 kg/m.    ECOG FS:1 - Symptomatic but completely ambulatory  General: Well-developed, well-nourished, no acute distress. Eyes: Pink conjunctiva, anicteric sclera. HEENT: Normocephalic, moist mucous membranes, clear oropharnyx.  Minimally palpable lymphadenopathy. Lungs: Clear to auscultation bilaterally. Heart: Regular rate and rhythm. No rubs, murmurs, or gallops. Abdomen: Soft, nontender, nondistended. No organomegaly noted, normoactive bowel sounds. Musculoskeletal: No edema, cyanosis, or clubbing. Neuro: Alert, answering all questions appropriately. Cranial nerves grossly intact. Skin: No rashes or petechiae noted. Psych: Normal affect.  LAB RESULTS:  Lab Results  Component Value Date   NA 141 11/21/2017   K 4.2 11/21/2017   CL 108 11/21/2017    CO2 26 11/21/2017   GLUCOSE 90 11/21/2017   BUN 18 11/21/2017   CREATININE 0.80 07/02/2018   CALCIUM 10.2 11/21/2017   PROT 6.5 11/21/2017   ALBUMIN 4.2 11/21/2017   AST 20 11/21/2017   ALT 23 11/21/2017   ALKPHOS 92 11/21/2017   BILITOT 1.0 11/21/2017   GFRNONAA >60 11/21/2017   GFRAA >60 11/21/2017    Lab Results  Component Value Date   WBC 83.4 (HH) 04/01/2019   NEUTROABS 4.9 04/01/2019   HGB 13.8 04/01/2019   HCT 44.7 04/01/2019   MCV 99.1 04/01/2019   PLT 129 (L) 04/01/2019     STUDIES: No results found.  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  PLAN:    1. CLL: CT scan results from July 02, 2018 reviewed independently with essentially stable lymphadenopathy, although patient's spleen has increased to 18.5 cm.  Patient's white blood cell count continues to trend up and is now 83.4.  Thrombocytopenia has mildly improved to 129. She has a decreased performance status and increasing symptoms.  We once again discussed initiating Rituxan, but patient declined at this time.  Patient expressed understanding that she will have to reinitiate treatment at some point. She last received single agent Rituxan on May 17, 2016.  Return to clinic in 3 months with repeat laboratory work and further evaluation.  Will repeat imaging prior to initiating treatment. 2.  Early satiety, indigestion: Chronic and unchanged.  Likely secondary to splenomegaly.  3. Atrial fibrillation: Continue monitoring and treatment per cardiology. 4. Iron deficiency anemia: Patient's hemoglobin continues to be within normal limits.  She last received IV Feraheme in May 2017.  5. PET positive thyroid lesions: 1.2 cm right lobe nodule also seen on thyroid ultrasound April 06, 2016.  Continue follow-up with ENT as indicated. 6.  Pain: Patient does not complain of this today. 7.  Nausea: Continue Compazine as needed.  I spent a total of 30 minutes face-to-face with the  patient of which greater than 50% of the visit was spent in counseling and coordination of care as detailed above.   Patient expressed understanding and was in agreement with this plan. She also understands that She can call clinic at any time with any questions, concerns, or complaints.     Lloyd Huger, MD   04/02/2019 6:11 AM

## 2019-04-01 ENCOUNTER — Inpatient Hospital Stay: Payer: Medicare Other | Attending: Oncology

## 2019-04-01 ENCOUNTER — Other Ambulatory Visit: Payer: Self-pay

## 2019-04-01 ENCOUNTER — Inpatient Hospital Stay (HOSPITAL_BASED_OUTPATIENT_CLINIC_OR_DEPARTMENT_OTHER): Payer: Medicare Other | Admitting: Oncology

## 2019-04-01 VITALS — BP 115/85 | HR 55 | Temp 98.3°F | Ht 67.0 in | Wt 259.0 lb

## 2019-04-01 DIAGNOSIS — D696 Thrombocytopenia, unspecified: Secondary | ICD-10-CM | POA: Diagnosis not present

## 2019-04-01 DIAGNOSIS — R531 Weakness: Secondary | ICD-10-CM | POA: Diagnosis not present

## 2019-04-01 DIAGNOSIS — R6881 Early satiety: Secondary | ICD-10-CM | POA: Diagnosis not present

## 2019-04-01 DIAGNOSIS — Z87891 Personal history of nicotine dependence: Secondary | ICD-10-CM | POA: Insufficient documentation

## 2019-04-01 DIAGNOSIS — Z7901 Long term (current) use of anticoagulants: Secondary | ICD-10-CM | POA: Insufficient documentation

## 2019-04-01 DIAGNOSIS — I10 Essential (primary) hypertension: Secondary | ICD-10-CM | POA: Diagnosis not present

## 2019-04-01 DIAGNOSIS — R110 Nausea: Secondary | ICD-10-CM | POA: Diagnosis not present

## 2019-04-01 DIAGNOSIS — I480 Paroxysmal atrial fibrillation: Secondary | ICD-10-CM | POA: Insufficient documentation

## 2019-04-01 DIAGNOSIS — C9110 Chronic lymphocytic leukemia of B-cell type not having achieved remission: Secondary | ICD-10-CM | POA: Diagnosis not present

## 2019-04-01 LAB — CBC WITH DIFFERENTIAL/PLATELET
Abs Immature Granulocytes: 0.14 10*3/uL — ABNORMAL HIGH (ref 0.00–0.07)
Basophils Absolute: 0.2 10*3/uL — ABNORMAL HIGH (ref 0.0–0.1)
Basophils Relative: 0 %
Eosinophils Absolute: 0.2 10*3/uL (ref 0.0–0.5)
Eosinophils Relative: 0 %
HCT: 44.7 % (ref 36.0–46.0)
Hemoglobin: 13.8 g/dL (ref 12.0–15.0)
Immature Granulocytes: 0 %
Lymphocytes Relative: 92 %
Lymphs Abs: 76.4 10*3/uL — ABNORMAL HIGH (ref 0.7–4.0)
MCH: 30.6 pg (ref 26.0–34.0)
MCHC: 30.9 g/dL (ref 30.0–36.0)
MCV: 99.1 fL (ref 80.0–100.0)
Monocytes Absolute: 1.6 10*3/uL — ABNORMAL HIGH (ref 0.1–1.0)
Monocytes Relative: 2 %
Neutro Abs: 4.9 10*3/uL (ref 1.7–7.7)
Neutrophils Relative %: 6 %
Platelets: 129 10*3/uL — ABNORMAL LOW (ref 150–400)
RBC: 4.51 MIL/uL (ref 3.87–5.11)
RDW: 14.5 % (ref 11.5–15.5)
Smear Review: NORMAL
WBC: 83.4 10*3/uL (ref 4.0–10.5)
nRBC: 0 % (ref 0.0–0.2)

## 2019-04-01 MED ORDER — HYDROCODONE-ACETAMINOPHEN 5-325 MG PO TABS: 1.0000 | ORAL_TABLET | Freq: Four times a day (QID) | ORAL | 0 refills | Status: DC | PRN

## 2019-04-01 MED ORDER — PROMETHAZINE HCL 25 MG PO TABS: 25.0000 mg | ORAL_TABLET | Freq: Four times a day (QID) | ORAL | 0 refills | Status: DC | PRN

## 2019-04-01 NOTE — Progress Notes (Signed)
Patient stated that she's been doing well. Patient would like a refill on her hydrocodone-acetaminophen and

## 2019-04-14 ENCOUNTER — Other Ambulatory Visit: Payer: Self-pay | Admitting: Cardiology

## 2019-05-01 ENCOUNTER — Other Ambulatory Visit: Payer: Self-pay

## 2019-05-01 ENCOUNTER — Ambulatory Visit (INDEPENDENT_AMBULATORY_CARE_PROVIDER_SITE_OTHER): Payer: Medicare Other | Admitting: Gastroenterology

## 2019-05-01 ENCOUNTER — Encounter: Payer: Self-pay | Admitting: Gastroenterology

## 2019-05-01 DIAGNOSIS — R109 Unspecified abdominal pain: Secondary | ICD-10-CM | POA: Diagnosis not present

## 2019-05-01 MED ORDER — AMOXICILLIN-POT CLAVULANATE 875-125 MG PO TABS: 1.0000 | ORAL_TABLET | Freq: Three times a day (TID) | ORAL | 0 refills | Status: DC

## 2019-05-01 NOTE — Progress Notes (Signed)
Referring Provider: Renee Rival, NP Primary Care Physician:  Renee Rival, NP  Primary GI: Dr. Gala Romney   Chief Complaint  Patient presents with  . Abdominal Pain    LUQ x 1 week  . Nausea    no vomiting    HPI:   Daisy Black is a 68 y.o. female presenting today with a history of CLL, followed by [facility]. Colonoscopy and EGD on file. Colonoscopy surveillance due in 2024. History of chronic GERD.   Nausea for about a week. Worsened postprandially. Pain in LUQ mainly but also left-sided/LLQ. No fever/chills. Pain started last week. Pain underlying but worsened postprandially. No constipation. No loose stool. No rectal bleeding. Early satiety in setting of splenomegaly. GERD: Protonix once daily.  Feels tired and weak. Unable to take Flagyl due to severe nausea. Recently saw Dr. Grayland Ormond, oncologist. Last imaging Dec 2019 with splenomegaly: measuring 18.5 cm. Recommended initiating Rituxan, but she declined at that time. She will be seeing Oncology again in Dec 2020 and CT around that time.     Past Medical History:  Diagnosis Date  . Anemia   . Arthritis   . CLL (chronic lymphocytic leukemia) ([place])   . Essential hypertension   . GERD (gastroesophageal reflux disease)   . History of hiatal hernia   . Hyperlipidemia   . Paroxysmal atrial fibrillation ([facility])   . Splenomegaly     Past Surgical History:  Procedure Laterality Date  . ANKLE SURGERY Right    repair fracture  . CARDIOVERSION N/A 03/20/2016   Procedure: CARDIOVERSION;  Surgeon: Satira Sark, MD;  Location: [facility];  Service: Cardiovascular;  Laterality: N/A;  . CARDIOVERSION N/A 05/22/2016   Procedure: CARDIOVERSION;  Surgeon: Satira Sark, MD;  Location: [facility];  Service: Cardiovascular;  Laterality: N/A;  . CHOLECYSTECTOMY    . COLONOSCOPY WITH PROPOFOL N/A 03/27/2018   Internal hemorrhoids, diverticulosis in sigmoid and descending colon, three 4-6 mm polyps at IC valve.  tubular adenomas 5 year surveillance  . ESOPHAGOGASTRODUODENOSCOPY (EGD) WITH PROPOFOL N/A 03/27/2018   Moderate Schatzki's ring s/p dilation, small hiatal hernia  . HERNIA REPAIR  2011   Incisional and umbilical utilizing mesh  . MALONEY DILATION N/A 03/27/2018   Procedure: Venia Minks DILATION;  Surgeon: Daneil Dolin, MD;  [facility];  Service: Endoscopy;  Laterality: N/A;  . POLYPECTOMY  03/27/2018   Procedure: POLYPECTOMY;  Surgeon: Daneil Dolin, MD;  [facility];  Service: Endoscopy;;  colon  . ROTATOR CUFF REPAIR Left   . TEE WITHOUT CARDIOVERSION N/A 01/17/2016   Procedure: TRANSESOPHAGEAL ECHOCARDIOGRAM (TEE);  Surgeon: Herminio Commons, MD;  Location: [facility];  Service: Cardiovascular;  Laterality: N/A;  . TEE WITHOUT CARDIOVERSION N/A 02/21/2016   Procedure: TRANSESOPHAGEAL ECHOCARDIOGRAM (TEE) WITH PROPOFOL;  Surgeon: Herminio Commons, MD;  Location: [facility];  Service: Cardiovascular;  Laterality: N/A;  . TEE WITHOUT CARDIOVERSION N/A 03/20/2016   Procedure: TRANSESOPHAGEAL ECHOCARDIOGRAM (TEE) WITH PROPOFOL;  Surgeon: Satira Sark, MD;  Location: [facility];  Service: Cardiovascular;  Laterality: N/A;  . TONSILLECTOMY      Current Outpatient Medications  Medication Sig Dispense Refill  . acetaminophen (TYLENOL) 650 MG CR tablet Take 650-1,300 mg by mouth daily as needed for pain.     Marland Kitchen apixaban (ELIQUIS) 5 MG TABS tablet Take 1 tablet (5 mg total) by mouth 2 (two) times daily. 56 tablet 0  . cetirizine (ZYRTEC) 10 MG tablet Take 10 mg  by mouth daily as needed for allergies.    . Ferrous Gluconate-C-Folic Acid (IRON-C PO) Take 1 tablet by mouth daily.    . flecainide (TAMBOCOR) 50 MG tablet Take 50 mg by mouth 2 (two) times daily.    . fluticasone (FLONASE) 50 MCG/ACT nasal spray Place 1 spray into both nostrils daily as needed for allergies or rhinitis.    . furosemide (LASIX) 20 MG tablet Take 1 tablet (20 mg total) by mouth daily as needed. 30 tablet  6  . HYDROcodone-acetaminophen (NORCO/VICODIN) 5-325 MG tablet Take 1 tablet by mouth every 6 (six) hours as needed for moderate pain. 60 tablet 0  . levothyroxine (SYNTHROID, LEVOTHROID) 125 MCG tablet Take 125 mcg by mouth daily before breakfast.    . Melatonin 5 MG SUBL Place 1 tablet under the tongue as needed (for sleep).    . metoprolol tartrate (LOPRESSOR) 25 MG tablet TAKE ONE TABLET BY MOUTH TWO TIMES A DAY 180 tablet 1  . Multiple Vitamin (MULTIVITAMIN WITH MINERALS) TABS tablet Take 1 tablet by mouth daily.    . pantoprazole (PROTONIX) 40 MG tablet Take 40 mg by mouth daily.    . prochlorperazine (COMPAZINE) 10 MG tablet Take 1 tablet (10 mg total) by mouth every 6 (six) hours as needed for nausea or vomiting. 60 tablet 2  . promethazine (PHENERGAN) 25 MG tablet Take 1 tablet (25 mg total) by mouth every 6 (six) hours as needed for nausea or vomiting. 60 tablet 0  . amoxicillin-clavulanate (AUGMENTIN) 875-125 MG tablet Take 1 tablet by mouth 3 (three) times daily. With food 21 tablet 0   No current facility-administered medications for this visit.    Facility-Administered Medications Ordered in Other Visits  Medication Dose Route Frequency Provider Last Rate Last Dose  . hydrocortisone cream 1 % 1 application  1 application Topical TID PRN Satira Sark, MD        Allergies as of 05/01/2019 - Review Complete 05/01/2019  Allergen Reaction Noted  . Flagyl [metronidazole]  05/01/2019    Family History  Problem Relation Age of Onset  . Ovarian cancer Mother 4       Secondary to ovarian cancer  . Leukemia Mother   . Stroke Father 58       Brain stem infarction  . Breast cancer Paternal Aunt   . Crohn's disease Son   . Colon cancer Neg Hx     Social History   Socioeconomic History  . Marital status: Married    Spouse name: Not on file  . Number of children: 29  . Years of education: Not on file  . Highest education level: Not on file  Occupational History  . Not  on file  Social Needs  . Financial resource strain: Not on file  . Food insecurity    Worry: Not on file    Inability: Not on file  . Transportation needs    Medical: Not on file    Non-medical: Not on file  Tobacco Use  . Smoking status: Former Smoker    Packs/day: 0.25    Years: 2.00    Pack years: 0.50    Types: Cigarettes    Quit date: 08/03/1975    Years since quitting: 43.7  . Smokeless tobacco: Never Used  Substance and Sexual Activity  . Alcohol use: No    Alcohol/week: 0.0 standard drinks  . Drug use: No  . Sexual activity: Yes    Birth control/protection: Post-menopausal  Lifestyle  . Physical  activity    Days per week: Not on file    Minutes per session: Not on file  . Stress: Not on file  Relationships  . Social Herbalist on phone: Not on file    Gets together: Not on file    Attends religious service: Not on file    Active member of club or organization: Not on file    Attends meetings of clubs or organizations: Not on file    Relationship status: Not on file  Other Topics Concern  . Not on file  Social History Narrative  . Not on file    Review of Systems: Gen: see HPI CV: Denies chest pain, palpitations, syncope, peripheral edema, and claudication. Resp: Denies dyspnea at rest, cough, wheezing, coughing up blood, and pleurisy. GI: see HPI Derm: Denies rash, itching, dry skin Psych: Denies depression, anxiety, memory loss, confusion. No homicidal or suicidal ideation.  Heme: Denies bruising, bleeding, and enlarged lymph nodes.  Physical Exam: BP 123/68   Pulse 80   Temp 97.6 F (36.4 C) (Oral)   Ht 5\' 7"  (1.702 m)   Wt 261 lb (118.4 kg)   BMI 40.88 kg/m  General:   Alert and oriented. No distress noted. Pleasant and cooperative.  Head:  Normocephalic and atraumatic. Eyes:  Conjuctiva clear without scleral icterus. Abdomen:  +BS, soft, +TTP LUQ/left-sided, minimal LLQ and non-distended. No rebound or guarding. No HSM or masses  noted. Msk:  Symmetrical without gross deformities. Normal posture. Extremities:  Without edema. Neurologic:  Alert and  oriented x4 Psych:  Alert and cooperative. Normal mood and affect.

## 2019-05-01 NOTE — Assessment & Plan Note (Signed)
68 year old female with history of known diverticulosis, presenting with recurrent onset LUQ/left-sided abdominal pain but without fever or chills. Immunocompromised with history of CLL. Known splenomegaly could certainly be playing a role with left-sided discomfort. However, she states this feels worsened from baseline and similar to prior diverticular episode. Will empirically start Augmentin for brief course. If she has no improvement in next 24-48 hours, will order CT scan. CT scan already planned for Dec 2020 prior to follow-up with Oncology. Soft diet for now. Call with update. Continue PPI and return in 6 months regardless.

## 2019-05-01 NOTE — Patient Instructions (Addendum)
I have sent in Augmentin to take three times a day WITH food for the next 7 days.   Please call if you have any worsening pain.   Follow a low fiber diet for the next week, then increase to high fiber.   We will see you in 6 months!  I enjoyed seeing you again today! As you know, I value our relationship and want to provide genuine, compassionate, and quality care. I welcome your feedback. If you receive a survey regarding your visit,  I greatly appreciate you taking time to fill this out. See you next time!  Daisy Needs, PhD, ANP-BC [facility]   Low-Fiber Eating Plan Fiber is found in fruits, vegetables, whole grains, and beans. Eating a diet low in fiber helps to reduce how often you have bowel movements and how much you produce during a bowel movement. A low-fiber eating plan may help your digestive system heal if:  You have certain conditions, such as Crohn's disease or diverticulitis.  You recently had radiation therapy on your pelvis or bowel.  You recently had intestinal surgery.  You have a new surgical opening in your abdomen (colostomy or ileostomy).  Your intestine is narrowed (stricture). Your health care provider will determine how long you need to stay on this diet. Your health care provider may recommend that you work with a diet and nutrition specialist (dietitian). What are tips for following this plan? General guidelines  Follow recommendations from your dietitian about how much fiber you should have each day.  Most people on this eating plan should try to eat less than 10 grams (g) of fiber each day. Your daily fiber goal is _________________ g.  Take vitamin and mineral supplements as told by your health care provider or dietitian. Chewable or liquid forms are best when on this eating plan. Reading food labels  Check food labels for the amount of dietary fiber.  Choose foods that have less than 2 grams of fiber in one  serving. Cooking  Use white flour and other allowed grains for baking and cooking.  Cook meat using methods that keep it tender, such as braising or poaching.  Cook eggs until the yolk is completely solid.  Cook with healthy oils, such as olive oil or canola oil. Meal planning   Eat 5-6 small meals throughout the day instead of 3 large meals.  If you are lactose intolerant: ? Choose low-lactose dairy foods. ? Do not eat dairy foods, if told by your dietitian.  Limit fat and oils to less than 8 teaspoons a day.  Eat small portions of desserts. What foods are allowed? The items listed below may not be a complete list. Talk with your dietitian about what dietary choices are best for you. Grains All bread and crackers made with white flour. Waffles, pancakes, and [place] toast. Bagels. Pretzels. Melba toast, zwieback, and matzoh. Cooked and dried cereals that do not contain whole grains, added fiber, seeds, or dried fruit. CornmealDomenick Gong. Hot and cold cereals made with refined corn, wheat, rice, or oats. Plain pasta and noodles. White rice. Vegetables Well-cooked or canned vegetables without skin, seeds, or stems. Cooked potatoes without skins. Vegetable juice. Fruits Soft-cooked or canned fruits without skin and seeds. Peeled ripe banana. Applesauce. Fruit juice without pulp. Meats and other protein foods Ground meat. Tender cuts of meat or poultry. Eggs. Fish, seafood, and shellfish. Smooth nut butters. Tofu. Dairy All milk products and drinks. Lactose-free milks, including rice, soy, and almond milks.  Yogurt without fruit, nuts, chocolate, or granola mix-ins. Sour cream. Cottage cheese. Cheese. Beverages Decaf coffee. Fruit and vegetable juices or smoothies (in small amounts, with no pulp or skins, and with fruits from allowed list). Sports drinks. Herbal tea. Fats and oils Olive oil, canola oil, sunflower oil, flaxseed oil, and grapeseed oil. Mayonnaise. Cream cheese. Margarine.  Butter. Sweets and desserts Plain cakes and cookies. Cream pies and pies made with allowed fruits. Pudding. Custard. Fruit gelatin. Sherbet. Popsicles. Ice cream without nuts. Plain hard candy. Honey. Jelly. Molasses. Syrups, including chocolate syrup. Chocolate. Marshmallows. Gumdrops. Seasoning and other foods Bouillon. Broth. Cream soups made from allowed foods. Strained soup. Casseroles made with allowed foods. Ketchup. Mild mustard. Mild salad dressings. Plain gravies. Vinegar. Spices in moderation. Salt. Sugar. What foods are not allowed? The items listed below may not be a complete list. Talk with your dietitian about what dietary choices are best for you. Grains Whole wheat and whole grain breads and crackers. Multigrain breads and crackers. Rye bread. Whole grain or multigrain cereals. Cereals with nuts, raisins, or coconut. Bran. Coarse wheat cereals. Granola. High-fiber cereals. Cornmeal or corn bread. Whole grain pasta. Wild or brown rice. Quinoa. Popcorn. Buckwheat. Wheat germ. Vegetables Potato skins. Raw or undercooked vegetables. All beans and bean sprouts. Cooked greens. Corn. Peas. Cabbage. Beets. Broccoli. Brussels sprouts. Cauliflower. Mushrooms. Onions. Peppers. Parsnips. Okra. Sauerkraut. Fruit Raw or dried fruit. Berries. Fruit juice with pulp. Prune juice. Meats and other protein foods Tough, fibrous meats with gristle. Fatty meat. Poultry with skin. Fried meat, Sales executive, or fish. Deli or lunch meats. Sausage, bacon, and hot dogs. Nuts and chunky nut butter. Dried peas, beans, and lentils. Dairy Yogurt with fruit, nuts, chocolate, or granola mix-ins. Beverages Caffeinated coffee and teas. Fats and oils Avocado. Coconut. Sweets and desserts Desserts, cookies, or candies that contain nuts or coconut. Dried fruit. Jams and preserves with seeds. Marmalade. Any dessert made with fruits or grains that are not allowed. Seasoning and other foods Corn tortilla chips. Soups made  with vegetables or grains that are not allowed. Relish. Horseradish. Angie Fava. Olives. Summary  Most people on a low-fiber eating plan should eat less than 10 grams of fiber a day. Follow recommendations from your dietitian about how much fiber you should have each day.  Always check food labels to see the dietary fiber content of packaged foods. In general, a low-fiber food will have fewer than 2 grams of fiber per serving.  In general, try to avoid whole grains, raw fruits and vegetables, dried fruit, tough cuts of meat, nuts, and seeds.  Take a vitamin and mineral supplement as told by your health care provider or dietitian. This information is not intended to replace advice given to you by your health care provider. Make sure you discuss any questions you have with your health care provider. Document Released: 01/05/2002 Document Revised: 11/07/2018 Document Reviewed: 09/18/2016 Elsevier Patient Education  2020 [employer].

## 2019-05-04 ENCOUNTER — Telehealth: Payer: Self-pay | Admitting: Internal Medicine

## 2019-05-04 MED ORDER — FLUCONAZOLE 150 MG PO TABS: 150.0000 mg | ORAL_TABLET | Freq: Once | ORAL | 0 refills | Status: AC

## 2019-05-04 NOTE — Telephone Encounter (Signed)
[phone] meds Vicente Males put patient on have given her a terrible yeast infection, can we call in some medication for that.  Last time it was in pill form

## 2019-05-04 NOTE — Telephone Encounter (Signed)
I sent in diflucan to take once now by mouth. May repeat in 72 hours for 1 dose if needed.

## 2019-05-04 NOTE — Telephone Encounter (Signed)
Spoke with pt. She is taking the Augmentin as directed. Pt is having c/o itching and burning. Pt states Fluconazole tabs work better that ov topical cream.

## 2019-05-04 NOTE — Telephone Encounter (Signed)
Noted. Pt notified of medication being sent to her pharmacy and pt is aware of how to take the medication.

## 2019-05-04 NOTE — Addendum Note (Signed)
Addended by: Annitta Needs on: 05/04/2019 03:05 PM   Modules accepted: Orders

## 2019-05-18 ENCOUNTER — Other Ambulatory Visit: Payer: Self-pay | Admitting: Gastroenterology

## 2019-05-18 ENCOUNTER — Other Ambulatory Visit: Payer: Self-pay | Admitting: Cardiology

## 2019-06-08 ENCOUNTER — Telehealth: Payer: Self-pay | Admitting: *Deleted

## 2019-06-08 ENCOUNTER — Encounter: Payer: Self-pay | Admitting: Cardiology

## 2019-06-08 ENCOUNTER — Other Ambulatory Visit: Payer: Self-pay

## 2019-06-08 ENCOUNTER — Ambulatory Visit (INDEPENDENT_AMBULATORY_CARE_PROVIDER_SITE_OTHER): Payer: Medicare Other | Admitting: Cardiology

## 2019-06-08 VITALS — BP 111/69 | HR 80 | Ht 67.0 in | Wt 262.4 lb

## 2019-06-08 DIAGNOSIS — M7989 Other specified soft tissue disorders: Secondary | ICD-10-CM | POA: Diagnosis not present

## 2019-06-08 DIAGNOSIS — I480 Paroxysmal atrial fibrillation: Secondary | ICD-10-CM

## 2019-06-08 MED ORDER — APIXABAN 5 MG PO TABS: 5.0000 mg | ORAL_TABLET | Freq: Two times a day (BID) | ORAL | 0 refills | Status: DC

## 2019-06-08 MED ORDER — FUROSEMIDE 20 MG PO TABS: 40.0000 mg | ORAL_TABLET | Freq: Every day | ORAL | 6 refills | Status: DC

## 2019-06-08 NOTE — Telephone Encounter (Signed)
Called office requesting an appointment to be seen for LE swelling that has not improved after taking furosemide 20 mg daily for several days. Advised patient that we could adjust her fluid pill to help with swelling and patient says she prefer to come to the office to discuss this. Appointment available today @3 :40 pm and given to patient.       COVID-19 Pre-Screening Questions:  . In the past 7 to 10 days have you had a cough,  shortness of breath, headache, congestion, fever (100 or greater) body aches, chills, sore throat, or sudden loss of taste or sense of smell? No . Have you been around anyone with known Covid 19. No . Have you been around anyone who is awaiting Covid 19 test results in the past 7 to 10 days? No . Have you been around anyone who has been exposed to Covid 19, or has mentioned symptoms of Covid 19 within the past 7 to 10 days?No  If you have any concerns/questions about symptoms patients report during screening (either on the phone or at threshold). Contact the provider seeing the patient or DOD for further guidance.  If neither are available contact a member of the leadership team.

## 2019-06-08 NOTE — Patient Instructions (Addendum)
Medication Instructions:   Your physician has recommended you make the following change in your medication:   Increase furosemide to 40 mg daily  Continue all other medications the same   Labwork:   Your physician recommends that you return for non-fasting lab work in: 10 days to check your BMET.   Testing/Procedures: Your physician has requested that you have an echocardiogram. Echocardiography is a painless test that uses sound waves to create images of your heart. It provides your doctor with information about the size and shape of your heart and how well your heart's chambers and valves are working. This procedure takes approximately one hour. There are no restrictions for this procedure.  Follow-Up:  Your physician recommends that you schedule a follow-up appointment in: pending.  Any Other Special Instructions Will Be Listed Below (If Applicable).  If you need a refill on your cardiac medications before your next appointment, please call your pharmacy.

## 2019-06-08 NOTE — Progress Notes (Signed)
Cardiology Office Note  Date: 06/08/2019   ID: Daisy Black, Daisy Black 02/20/1951, MRN [id number]  PCP:  Renee Rival, NP  Cardiologist:  Rozann Lesches, MD Electrophysiologist:  None   Chief Complaint  Patient presents with   Cardiac follow-up    History of Present Illness: Daisy Black is a 68 y.o. female last assessed via telehealth encounter in June.  She was added on to the schedule today reporting leg swelling.  She states that over the last month she has had more notable leg swelling, left greater than right, but also in the setting of progressive left arthritic knee pain and suspected need for knee replacement next year.  She has been using her Lasix 20 mg daily without much improvement over the last few weeks.  Otherwise, she has had no discoloration of her legs, no focal pain, no increasing palpitations or chest pain.  I reviewed her medications which are stable from a cardiac perspective. Heart rate is regular today on examination.  Last assessment of LVEF was in 2017 as outlined below.  Past Medical History:  Diagnosis Date   Anemia    Arthritis    CLL (chronic lymphocytic leukemia) ([place])    Essential hypertension    GERD (gastroesophageal reflux disease)    History of hiatal hernia    Hyperlipidemia    Paroxysmal atrial fibrillation (HCC)    Splenomegaly     Past Surgical History:  Procedure Laterality Date   ANKLE SURGERY Right    repair fracture   CARDIOVERSION N/A 03/20/2016   Procedure: CARDIOVERSION;  Surgeon: Satira Sark, MD;  Location: [facility];  Service: Cardiovascular;  Laterality: N/A;   CARDIOVERSION N/A 05/22/2016   Procedure: CARDIOVERSION;  Surgeon: Satira Sark, MD;  Location: [facility];  Service: Cardiovascular;  Laterality: N/A;   CHOLECYSTECTOMY     COLONOSCOPY WITH PROPOFOL N/A 03/27/2018   Internal hemorrhoids, diverticulosis in sigmoid and descending colon, three 4-6 mm polyps at IC valve. tubular adenomas 5  year surveillance   ESOPHAGOGASTRODUODENOSCOPY (EGD) WITH PROPOFOL N/A 03/27/2018   Moderate Schatzki's ring s/p dilation, small hiatal hernia   HERNIA REPAIR  2011   Incisional and umbilical utilizing mesh   MALONEY DILATION N/A 03/27/2018   Procedure: Venia Minks DILATION;  Surgeon: Daneil Dolin, MD;  Location: [facility];  Service: Endoscopy;  Laterality: N/A;   POLYPECTOMY  03/27/2018   Procedure: POLYPECTOMY;  Surgeon: Daneil Dolin, MD;  Location: [facility];  Service: Endoscopy;;  colon   ROTATOR CUFF REPAIR Left    TEE WITHOUT CARDIOVERSION N/A 01/17/2016   Procedure: TRANSESOPHAGEAL ECHOCARDIOGRAM (TEE);  Surgeon: Herminio Commons, MD;  Location: [facility];  Service: Cardiovascular;  Laterality: N/A;   TEE WITHOUT CARDIOVERSION N/A 02/21/2016   Procedure: TRANSESOPHAGEAL ECHOCARDIOGRAM (TEE) WITH PROPOFOL;  Surgeon: Herminio Commons, MD;  Location: [facility];  Service: Cardiovascular;  Laterality: N/A;   TEE WITHOUT CARDIOVERSION N/A 03/20/2016   Procedure: TRANSESOPHAGEAL ECHOCARDIOGRAM (TEE) WITH PROPOFOL;  Surgeon: Satira Sark, MD;  Location: [facility];  Service: Cardiovascular;  Laterality: N/A;   TONSILLECTOMY      Current Outpatient Medications  Medication Sig Dispense Refill   acetaminophen (TYLENOL) 650 MG CR tablet Take 650-1,300 mg by mouth daily as needed for pain.      apixaban (ELIQUIS) 5 MG TABS tablet Take 1 tablet (5 mg total) by mouth 2 (two) times daily. 28 tablet 0   cetirizine (ZYRTEC) 10 MG tablet Take 10 mg by  mouth daily as needed for allergies.     Ferrous Gluconate-C-Folic Acid (IRON-C PO) Take 1 tablet by mouth daily.     flecainide (TAMBOCOR) 50 MG tablet TAKE 1.5 TABLETS BY MOUTH TWO TIMES A DAY 270 tablet 3   fluticasone (FLONASE) 50 MCG/ACT nasal spray Place 1 spray into both nostrils daily as needed for allergies or rhinitis.     furosemide (LASIX) 20 MG tablet Take 2 tablets (40 mg total) by mouth daily. 60 tablet 6    HYDROcodone-acetaminophen (NORCO/VICODIN) 5-325 MG tablet Take 1 tablet by mouth every 6 (six) hours as needed for moderate pain. 60 tablet 0   levothyroxine (SYNTHROID, LEVOTHROID) 125 MCG tablet Take 125 mcg by mouth daily before breakfast.     Melatonin 5 MG SUBL Place 1 tablet under the tongue as needed (for sleep).     metoprolol tartrate (LOPRESSOR) 25 MG tablet TAKE ONE TABLET BY MOUTH TWO TIMES A DAY 180 tablet 1   Multiple Vitamin (MULTIVITAMIN WITH MINERALS) TABS tablet Take 1 tablet by mouth daily.     pantoprazole (PROTONIX) 40 MG tablet Take 40 mg by mouth daily.     prochlorperazine (COMPAZINE) 10 MG tablet Take 1 tablet (10 mg total) by mouth every 6 (six) hours as needed for nausea or vomiting. 60 tablet 2   promethazine (PHENERGAN) 25 MG tablet Take 1 tablet (25 mg total) by mouth every 6 (six) hours as needed for nausea or vomiting. 60 tablet 0   No current facility-administered medications for this visit.    Facility-Administered Medications Ordered in Other Visits  Medication Dose Route Frequency Provider Last Rate Last Dose   hydrocortisone cream 1 % 1 application  1 application Topical TID PRN Satira Sark, MD       Allergies:  Flagyl [metronidazole]   Social History: The patient  reports that she quit smoking about 43 years ago. Her smoking use included cigarettes. She has a 0.50 pack-year smoking history. She has never used smokeless tobacco. She reports that she does not drink alcohol or use drugs.   ROS:  Please see the history of present illness. Otherwise, complete review of systems is positive for none.  All other systems are reviewed and negative.   Physical Exam: VS:  BP 111/69    Pulse 80    Ht 5\' 7"  (1.702 m)    Wt 262 lb 6.4 oz (119 kg)    SpO2 97%    BMI 41.10 kg/m , BMI Body mass index is 41.1 kg/m.  Wt Readings from Last 3 Encounters:  06/08/19 262 lb 6.4 oz (119 kg)  05/01/19 261 lb (118.4 kg)  04/01/19 259 lb (117.5 kg)    General:  Patient appears comfortable at rest. HEENT: Conjunctiva and lids normal, wearing a mask. Neck: Supple, no elevated JVP or carotid bruits, no thyromegaly. Lungs: Clear to auscultation, nonlabored breathing at rest. Cardiac: Regular rate and rhythm, no S3 or significant systolic murmur. Abdomen: Soft, nontender, bowel sounds present. Extremities: 1-2+ lower leg edema, left greater than right, distal pulses 2+. Skin: Warm and dry. Musculoskeletal: No kyphosis. Neuropsychiatric: Alert and oriented x3, affect grossly appropriate.  ECG:  An ECG dated 08/07/2018 was personally reviewed today and demonstrated:  Sinus rhythm with PACs.  Recent Labwork: 07/02/2018: Creatinine, Ser 0.80 04/01/2019: Hemoglobin 13.8; Platelets 129     Component Value Date/Time   CHOL 219 (H) 11/30/2009 1950   TRIG 69 11/30/2009 1950   HDL 56 11/30/2009 1950   CHOLHDL 3.9 Ratio  11/30/2009 1950   VLDL 14 11/30/2009 1950   LDLCALC 149 (H) 11/30/2009 1950    Other Studies Reviewed Today:  TEE 03/20/2016: Study Conclusions  - Left ventricle: Systolic function was normal. The estimated ejection fraction was in the range of 60% to 65%. Wall motion was normal; there were no regional wall motion abnormalities. No evidence of thrombus. - Descending aorta: The descending aorta had minor luminal irregularities. - Mitral valve: There was mild regurgitation. - Left atrium: The atrium was mildly dilated. No evidence of thrombus in the atrial cavity or appendage. Prominent trabeculation visualized at tip of appendage. Emptying velocity was moderately reduced. - Right atrium: No evidence of thrombus in the atrial cavity or appendage. - Tricuspid valve: There was mild regurgitation. - Pericardium, extracardiac: There was no pericardial effusion.  Impressions:  - LVEF 60-65% without wall motion abnormalities. No left atrial appendage thrombus visualized. There is prominent trabeculation noted at  the appendage tip. Emptying velocity was moderately reduced. No right atrial appendage thrombus noted. Mild tricuspid regurgitation.  Assessment and Plan:  1.  Leg swelling, bilateral but left greater than right in the setting of progressive left knee arthritis.  Heart rate is regular today, not apparent that she has had worsening atrial fibrillation.  Last assessment of LVEF was 60 to 65% as of 2017.  Follow-up echocardiogram be obtained.  Increase Lasix to 40 mg daily with follow-up BMET in 10 days.  Unlikely to represent DVT since she is on Eliquis.  2.  Paroxysmal atrial fibrillation.  Continue Lopressor, flecainide, and Eliquis.  Heart rate is regular today.  3.  History of symptomatic bradycardia, currently tolerating present dose of Lopressor with heart rate in the 80s.  Medication Adjustments/Labs and Tests Ordered: Current medicines are reviewed at length with the patient today.  Concerns regarding medicines are outlined above.   Tests Ordered: Orders Placed This Encounter  Procedures   Basic metabolic panel   ECHOCARDIOGRAM COMPLETE    Medication Changes: Meds ordered this encounter  Medications   apixaban (ELIQUIS) 5 MG TABS tablet    Sig: Take 1 tablet (5 mg total) by mouth 2 (two) times daily.    Dispense:  28 tablet    Refill:  0    Lot# [id number] Exp- 06/2021   furosemide (LASIX) 20 MG tablet    Sig: Take 2 tablets (40 mg total) by mouth daily.    Dispense:  60 tablet    Refill:  6    06/08/2019 dose increase    Disposition:  Follow up test results and determine disposition.  Signed, Satira Sark, MD, [facility] 06/08/2019 4:07 PM    [facility] [place], [place], [place] [address] Phone: [phone]; Fax: [phone]

## 2019-06-10 ENCOUNTER — Ambulatory Visit (INDEPENDENT_AMBULATORY_CARE_PROVIDER_SITE_OTHER): Payer: Medicare Other

## 2019-06-10 ENCOUNTER — Other Ambulatory Visit: Payer: Self-pay

## 2019-06-10 DIAGNOSIS — I480 Paroxysmal atrial fibrillation: Secondary | ICD-10-CM

## 2019-06-11 ENCOUNTER — Telehealth: Payer: Self-pay | Admitting: *Deleted

## 2019-06-11 NOTE — Telephone Encounter (Signed)
Patient informed. Copy sent to PCP °

## 2019-06-11 NOTE — Telephone Encounter (Signed)
-----   Message from Satira Sark, MD sent at 06/11/2019  8:00 AM EST ----- Results reviewed.  LVEF remains normal at 55 to 60% with mild diastolic dysfunction.  We recently adjusted Lasix.  Continue with current plan.

## 2019-06-16 ENCOUNTER — Telehealth: Payer: Self-pay | Admitting: *Deleted

## 2019-06-16 NOTE — Telephone Encounter (Signed)
Left message on patient voice mail re doctor response

## 2019-06-16 NOTE — Telephone Encounter (Signed)
Given Thanksgiving, unlikely.  Recommend she see her PCP.

## 2019-06-16 NOTE — Telephone Encounter (Signed)
Patient called reporting that she is having bilateral swelling of lower legs and is asking if she can be seen before her 12/2 appointment. Please advise

## 2019-06-22 ENCOUNTER — Encounter: Payer: Self-pay | Admitting: *Deleted

## 2019-06-24 NOTE — Progress Notes (Signed)
Daisy Black  [phone] [phone]  ID: [name] OB: 08-Apr-1951  MR#: [id number]  [id number]  Patient Care Team: Renee Rival, NP as PCP - General (Nurse Practitioner) Satira Sark, MD as PCP - Cardiology (Cardiology) Aviva Signs, MD (General Surgery) Rico Junker, RN as Registered Nurse Theodore Demark, RN as Registered Nurse Satira Sark, MD as Consulting Physician (Cardiology) Gala Romney Cristopher Estimable, MD as Consulting Physician (Gastroenterology)  CHIEF COMPLAINT: CLL  INTERVAL HISTORY: Patient turns to clinic today for repeat laboratory work and further evaluation.  She continues to have chronic weakness and fatigue, but states this is mildly improved.  Her left flank pain and early satiety are unchanged.  She has no neurologic complaints.  She denies any fevers, chills, or night sweats.  She has no chest pain, shortness of breath, cough, or hemoptysis. She denies any vomiting, constipation, or diarrhea.  She has no urinary complaints.  Patient offers no further specific complaints today.  REVIEW OF SYSTEMS:   Review of Systems  Constitutional: Positive for malaise/fatigue. Negative for diaphoresis, fever and weight loss.  Respiratory: Negative.  Negative for cough and shortness of breath.   Cardiovascular: Negative.  Negative for chest pain and leg swelling.  Gastrointestinal: Positive for heartburn. Negative for abdominal pain, blood in stool, constipation, diarrhea, melena, nausea and vomiting.  Genitourinary: Positive for flank pain.  Musculoskeletal: Negative for back pain.  Skin: Negative.  Negative for rash.  Neurological: Positive for weakness. Negative for dizziness, sensory change, focal weakness and headaches.  Psychiatric/Behavioral: Negative.  The patient is not nervous/anxious.     As per HPI. Otherwise, a complete review of systems is negative.  PAST MEDICAL HISTORY: Past Medical History:   Diagnosis Date  . Anemia   . Arthritis   . CLL (chronic lymphocytic leukemia) ([facility])   . Essential hypertension   . GERD (gastroesophageal reflux disease)   . History of hiatal hernia   . Hyperlipidemia   . Paroxysmal atrial fibrillation ([facility])   . Splenomegaly     PAST SURGICAL HISTORY: Past Surgical History:  Procedure Laterality Date  . ANKLE SURGERY Right    repair fracture  . CARDIOVERSION N/A 03/20/2016   Procedure: CARDIOVERSION;  Surgeon: Satira Sark, MD;  Location: [facility];  Service: Cardiovascular;  Laterality: N/A;  . CARDIOVERSION N/A 05/22/2016   Procedure: CARDIOVERSION;  Surgeon: Satira Sark, MD;  Location: [facility];  Service: Cardiovascular;  Laterality: N/A;  . CHOLECYSTECTOMY    . COLONOSCOPY WITH PROPOFOL N/A 03/27/2018   Internal hemorrhoids, diverticulosis in sigmoid and descending colon, three 4-6 mm polyps at IC valve. tubular adenomas 5 year surveillance  . ESOPHAGOGASTRODUODENOSCOPY (EGD) WITH PROPOFOL N/A 03/27/2018   Moderate Schatzki's ring s/p dilation, small hiatal hernia  . HERNIA REPAIR  2011   Incisional and umbilical utilizing mesh  . MALONEY DILATION N/A 03/27/2018   Procedure: Venia Minks DILATION;  Surgeon: Daneil Dolin, MD;  Location: [facility];  Service: Endoscopy;  Laterality: N/A;  . POLYPECTOMY  03/27/2018   Procedure: POLYPECTOMY;  Surgeon: Daneil Dolin, MD;  Location: [facility];  Service: Endoscopy;;  colon  . ROTATOR CUFF REPAIR Left   . TEE WITHOUT CARDIOVERSION N/A 01/17/2016   Procedure: TRANSESOPHAGEAL ECHOCARDIOGRAM (TEE);  Surgeon: Herminio Commons, MD;  Location: [facility];  Service: Cardiovascular;  Laterality: N/A;  . TEE WITHOUT CARDIOVERSION N/A 02/21/2016   Procedure: TRANSESOPHAGEAL ECHOCARDIOGRAM (TEE) WITH PROPOFOL;  Surgeon: Herminio Commons, MD;  Location: [facility];  Service: Cardiovascular;  Laterality: N/A;  . TEE WITHOUT CARDIOVERSION N/A 03/20/2016   Procedure: TRANSESOPHAGEAL ECHOCARDIOGRAM (TEE) WITH  PROPOFOL;  Surgeon: Satira Sark, MD;  Location: [facility];  Service: Cardiovascular;  Laterality: N/A;  . TONSILLECTOMY      FAMILY HISTORY Family History  Problem Relation Age of Onset  . Ovarian cancer Mother 68       Secondary to ovarian cancer  . Leukemia Mother   . Stroke Father 71       Brain stem infarction  . Breast cancer Paternal Aunt   . Crohn's disease Son   . Colon cancer Neg Hx        ADVANCED DIRECTIVES:    HEALTH MAINTENANCE: Social History   Tobacco Use  . Smoking status: Former Smoker    Packs/day: 0.25    Years: 2.00    Pack years: 0.50    Types: Cigarettes    Quit date: 08/03/1975    Years since quitting: 43.9  . Smokeless tobacco: Never Used  Substance Use Topics  . Alcohol use: No    Alcohol/week: 0.0 standard drinks  . Drug use: No     Colonoscopy:  PAP:  Bone density:  Lipid panel:  Allergies  Allergen Reactions  . Flagyl [Metronidazole]     Classic side effect of severe nausea, unable to tolerate.     Current Outpatient Medications  Medication Sig Dispense Refill  . acetaminophen (TYLENOL) 650 MG CR tablet Take 650-1,300 mg by mouth daily as needed for pain.     Marland Kitchen apixaban (ELIQUIS) 5 MG TABS tablet Take 1 tablet (5 mg total) by mouth 2 (two) times daily. 28 tablet 0  . cetirizine (ZYRTEC) 10 MG tablet Take 10 mg by mouth daily as needed for allergies.    . Ferrous Gluconate-C-Folic Acid (IRON-C PO) Take 1 tablet by mouth daily.    . flecainide (TAMBOCOR) 50 MG tablet TAKE 1.5 TABLETS BY MOUTH TWO TIMES A DAY 270 tablet 3  . fluticasone (FLONASE) 50 MCG/ACT nasal spray Place 1 spray into both nostrils daily as needed for allergies or rhinitis.    . furosemide (LASIX) 20 MG tablet Take 2 tablets (40 mg total) by mouth daily. 60 tablet 6  . HYDROcodone-acetaminophen (NORCO/VICODIN) 5-325 MG tablet Take 1 tablet by mouth every 6 (six) hours as needed for moderate pain. 60 tablet 0  . levothyroxine (SYNTHROID, LEVOTHROID) 125 MCG  tablet Take 125 mcg by mouth daily before breakfast.    . Melatonin 5 MG SUBL Place 1 tablet under the tongue as needed (for sleep).    . metoprolol tartrate (LOPRESSOR) 25 MG tablet TAKE ONE TABLET BY MOUTH TWO TIMES A DAY 180 tablet 1  . Multiple Vitamin (MULTIVITAMIN WITH MINERALS) TABS tablet Take 1 tablet by mouth daily.    . pantoprazole (PROTONIX) 40 MG tablet Take 40 mg by mouth daily.    . prochlorperazine (COMPAZINE) 10 MG tablet Take 1 tablet (10 mg total) by mouth every 6 (six) hours as needed for nausea or vomiting. 60 tablet 2  . promethazine (PHENERGAN) 25 MG tablet Take 1 tablet (25 mg total) by mouth every 6 (six) hours as needed for nausea or vomiting. 60 tablet 0   No current facility-administered medications for this visit.    Facility-Administered Medications Ordered in Other Visits  Medication Dose Route Frequency Provider Last Rate Last Dose  . hydrocortisone cream 1 % 1 application  1 application Topical TID PRN Satira Sark,  MD        OBJECTIVE: Vitals:   07/01/19 1037  BP: 127/65  Pulse: 76  Temp: 98 F (36.7 C)     Body mass index is 40.83 kg/m.    ECOG FS:1 - Symptomatic but completely ambulatory  General: Well-developed, well-nourished, no acute distress. Eyes: Pink conjunctiva, anicteric sclera. HEENT: Normocephalic, moist mucous membranes, minimally palpable lymphadenopathy. Lungs: Clear to auscultation bilaterally. Heart: Regular rate and rhythm. No rubs, murmurs, or gallops. Abdomen: Splenomegaly, nontender. Musculoskeletal: No edema, cyanosis, or clubbing. Neuro: Alert, answering all questions appropriately. Cranial nerves grossly intact. Skin: No rashes or petechiae noted. Psych: Normal affect.  LAB RESULTS:  Lab Results  Component Value Date   NA 141 11/21/2017   K 4.2 11/21/2017   CL 108 11/21/2017   CO2 26 11/21/2017   GLUCOSE 90 11/21/2017   BUN 18 11/21/2017   CREATININE 0.80 07/02/2018   CALCIUM 10.2 11/21/2017   PROT 6.5  11/21/2017   ALBUMIN 4.2 11/21/2017   AST 20 11/21/2017   ALT 23 11/21/2017   ALKPHOS 92 11/21/2017   BILITOT 1.0 11/21/2017   GFRNONAA >60 11/21/2017   GFRAA >60 11/21/2017    Lab Results  Component Value Date   WBC 78.9 (HH) 07/01/2019   NEUTROABS 4.0 07/01/2019   HGB 12.9 07/01/2019   HCT 43.9 07/01/2019   MCV 101.9 (H) 07/01/2019   PLT 101 (L) 07/01/2019     STUDIES: No results found.  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  PLAN:    1. CLL: CT scan results from July 02, 2018 reviewed independently with essentially stable lymphadenopathy, although patient's spleen has increased to 18.5 cm.  Patient's white blood cell count remains significantly elevated, but relatively stable at 78.9.  Platelet count has trended down and is now 101.  We once again discussed initiating Rituxan, but patient declined at this time.  Patient expressed understanding that she will have to reinitiate treatment at some point. She last received single agent Rituxan on May 17, 2016.  It has been 12 months since imaging, therefore will repeat CT scans in the next 1 to 2 weeks.  Patient states she may change her mind about treatment pending the results of imaging.  Return to clinic in 3 months for routine evaluation. 2.  Early satiety, indigestion: Chronic and unchanged.  Likely secondary to splenomegaly.  3. Atrial fibrillation: Continue monitoring and treatment per cardiology. 4. Iron deficiency anemia: Patient's hemoglobin continues to be within normal limits.  She last received IV Feraheme in May 2017.  5. PET positive thyroid lesions: 1.2 cm right lobe nodule also seen on thyroid ultrasound April 06, 2016.  Continue follow-up with ENT as indicated. 6.  Pain: Patient does not complain of this today. 7.  Nausea: Patient was given a refill of Compazine today.  Patient expressed understanding and was in agreement with this plan. She also understands that She  can call clinic at any time with any questions, concerns, or complaints.     Lloyd Huger, MD   07/03/2019 7:00 AM

## 2019-06-30 ENCOUNTER — Telehealth: Payer: Self-pay | Admitting: *Deleted

## 2019-06-30 NOTE — Telephone Encounter (Signed)
Returned call

## 2019-07-01 ENCOUNTER — Other Ambulatory Visit: Payer: Self-pay

## 2019-07-01 ENCOUNTER — Encounter: Payer: Self-pay | Admitting: Oncology

## 2019-07-01 ENCOUNTER — Inpatient Hospital Stay (HOSPITAL_BASED_OUTPATIENT_CLINIC_OR_DEPARTMENT_OTHER): Payer: Medicare Other | Admitting: Oncology

## 2019-07-01 ENCOUNTER — Inpatient Hospital Stay: Payer: Medicare Other | Attending: Oncology

## 2019-07-01 ENCOUNTER — Other Ambulatory Visit: Payer: Self-pay | Admitting: Emergency Medicine

## 2019-07-01 VITALS — BP 127/65 | HR 76 | Temp 98.0°F | Wt 260.7 lb

## 2019-07-01 DIAGNOSIS — I10 Essential (primary) hypertension: Secondary | ICD-10-CM | POA: Insufficient documentation

## 2019-07-01 DIAGNOSIS — C9110 Chronic lymphocytic leukemia of B-cell type not having achieved remission: Secondary | ICD-10-CM

## 2019-07-01 DIAGNOSIS — Z79899 Other long term (current) drug therapy: Secondary | ICD-10-CM | POA: Diagnosis not present

## 2019-07-01 DIAGNOSIS — R6881 Early satiety: Secondary | ICD-10-CM | POA: Insufficient documentation

## 2019-07-01 DIAGNOSIS — K30 Functional dyspepsia: Secondary | ICD-10-CM | POA: Insufficient documentation

## 2019-07-01 DIAGNOSIS — I480 Paroxysmal atrial fibrillation: Secondary | ICD-10-CM | POA: Insufficient documentation

## 2019-07-01 DIAGNOSIS — R110 Nausea: Secondary | ICD-10-CM | POA: Diagnosis not present

## 2019-07-01 DIAGNOSIS — Z7901 Long term (current) use of anticoagulants: Secondary | ICD-10-CM | POA: Insufficient documentation

## 2019-07-01 LAB — CBC WITH DIFFERENTIAL/PLATELET
Abs Immature Granulocytes: 0.11 10*3/uL — ABNORMAL HIGH (ref 0.00–0.07)
Basophils Absolute: 0.2 10*3/uL — ABNORMAL HIGH (ref 0.0–0.1)
Basophils Relative: 0 %
Eosinophils Absolute: 0.3 10*3/uL (ref 0.0–0.5)
Eosinophils Relative: 0 %
HCT: 43.9 % (ref 36.0–46.0)
Hemoglobin: 12.9 g/dL (ref 12.0–15.0)
Immature Granulocytes: 0 %
Lymphocytes Relative: 92 %
Lymphs Abs: 72.3 10*3/uL — ABNORMAL HIGH (ref 0.7–4.0)
MCH: 29.9 pg (ref 26.0–34.0)
MCHC: 29.4 g/dL — ABNORMAL LOW (ref 30.0–36.0)
MCV: 101.9 fL — ABNORMAL HIGH (ref 80.0–100.0)
Monocytes Absolute: 1.9 10*3/uL — ABNORMAL HIGH (ref 0.1–1.0)
Monocytes Relative: 3 %
Neutro Abs: 4 10*3/uL (ref 1.7–7.7)
Neutrophils Relative %: 5 %
Platelets: 101 10*3/uL — ABNORMAL LOW (ref 150–400)
RBC Morphology: NORMAL
RBC: 4.31 MIL/uL (ref 3.87–5.11)
RDW: 13.8 % (ref 11.5–15.5)
Smear Review: NORMAL
WBC Morphology: ABNORMAL
WBC: 78.9 10*3/uL (ref 4.0–10.5)
nRBC: 0 % (ref 0.0–0.2)

## 2019-07-01 MED ORDER — PROCHLORPERAZINE MALEATE 10 MG PO TABS: 10.0000 mg | ORAL_TABLET | Freq: Four times a day (QID) | ORAL | 2 refills | Status: DC | PRN

## 2019-07-01 MED ORDER — HYDROCODONE-ACETAMINOPHEN 5-325 MG PO TABS: 1.0000 | ORAL_TABLET | Freq: Four times a day (QID) | ORAL | 0 refills | Status: DC | PRN

## 2019-07-02 NOTE — Telephone Encounter (Signed)
Lab work done and reviewed by Dr. Domenic Polite.

## 2019-07-13 ENCOUNTER — Other Ambulatory Visit: Payer: Self-pay

## 2019-07-13 ENCOUNTER — Ambulatory Visit
Admission: RE | Admit: 2019-07-13 | Discharge: 2019-07-13 | Disposition: A | Discharge status: Home / Self Care | Payer: Medicare Other | Source: Ambulatory Visit | Attending: Oncology | Admitting: Oncology

## 2019-07-13 DIAGNOSIS — C9110 Chronic lymphocytic leukemia of B-cell type not having achieved remission: Secondary | ICD-10-CM | POA: Diagnosis not present

## 2019-07-13 HISTORY — DX: Unspecified asthma, uncomplicated: J45.909

## 2019-07-13 MED ORDER — IOHEXOL 300 MG/ML  SOLN
100.0000 mL | Freq: Once | INTRAMUSCULAR | Status: AC | PRN
  Administered 2019-07-13: 100 mL via INTRAVENOUS

## 2019-07-16 ENCOUNTER — Telehealth: Payer: Self-pay | Admitting: *Deleted

## 2019-07-16 NOTE — Telephone Encounter (Signed)
Patient called reporting that she wants to take Rituxan and was told to call when she decided that she wants to take it.

## 2019-07-16 NOTE — Telephone Encounter (Signed)
I did tell her that.  She is going to need weekly ritux x4.  Probably start the week after Christmas?  Thanks!

## 2019-07-17 ENCOUNTER — Telehealth: Payer: Self-pay | Admitting: Oncology

## 2019-07-17 NOTE — Telephone Encounter (Signed)
Please advise or call patient in regards to her concerns

## 2019-07-17 NOTE — Telephone Encounter (Signed)
Patient informed of doctor response and she thanked me for letting her know stating that was only thing I was worried about

## 2019-07-17 NOTE — Telephone Encounter (Signed)
Gave pt appt details for Rituxian start. She would like some clarification on the medication due to having a reaction from previous Rituxian treatment. Please call pt at number listed in demographics to discuss concerns.  Thank you Jerene Pitch

## 2019-07-17 NOTE — Telephone Encounter (Signed)
She we receive extra pre-meds and slower rate initially.

## 2019-07-30 ENCOUNTER — Other Ambulatory Visit: Payer: Self-pay | Admitting: Oncology

## 2019-07-30 ENCOUNTER — Other Ambulatory Visit: Payer: Self-pay

## 2019-07-30 ENCOUNTER — Encounter: Payer: Self-pay | Admitting: Nurse Practitioner

## 2019-07-30 ENCOUNTER — Inpatient Hospital Stay: Payer: Medicare Other

## 2019-07-30 VITALS — BP 107/71 | HR 85 | Resp 18 | Wt 265.0 lb

## 2019-07-30 DIAGNOSIS — C9110 Chronic lymphocytic leukemia of B-cell type not having achieved remission: Secondary | ICD-10-CM

## 2019-07-30 LAB — CBC WITH DIFFERENTIAL/PLATELET
Abs Immature Granulocytes: 0 10*3/uL (ref 0.00–0.07)
Basophils Absolute: 0 10*3/uL (ref 0.0–0.1)
Basophils Relative: 0 %
Eosinophils Absolute: 1.2 10*3/uL — ABNORMAL HIGH (ref 0.0–0.5)
Eosinophils Relative: 2 %
HCT: 43.6 % (ref 36.0–46.0)
Hemoglobin: 13 g/dL (ref 12.0–15.0)
Lymphocytes Relative: 90 %
Lymphs Abs: 56 10*3/uL — ABNORMAL HIGH (ref 0.7–4.0)
MCH: 29.7 pg (ref 26.0–34.0)
MCHC: 29.8 g/dL — ABNORMAL LOW (ref 30.0–36.0)
MCV: 99.5 fL (ref 80.0–100.0)
Monocytes Absolute: 1.9 10*3/uL — ABNORMAL HIGH (ref 0.1–1.0)
Monocytes Relative: 3 %
Neutro Abs: 3.1 10*3/uL (ref 1.7–7.7)
Neutrophils Relative %: 5 %
Platelets: 98 10*3/uL — ABNORMAL LOW (ref 150–400)
RBC: 4.38 MIL/uL (ref 3.87–5.11)
RDW: 13.9 % (ref 11.5–15.5)
Smear Review: NORMAL
WBC Morphology: ABNORMAL
WBC: 62.2 10*3/uL (ref 4.0–10.5)
nRBC: 0 % (ref 0.0–0.2)

## 2019-07-30 MED ORDER — FAMOTIDINE IN NACL 20-0.9 MG/50ML-% IV SOLN
20.0000 mg | Freq: Once | INTRAVENOUS | Status: AC
  Administered 2019-07-30: 20 mg via INTRAVENOUS
  Filled 2019-07-30: qty 50

## 2019-07-30 MED ORDER — DIPHENHYDRAMINE HCL 50 MG/ML IJ SOLN
25.0000 mg | Freq: Once | INTRAMUSCULAR | Status: AC | PRN
  Administered 2019-07-30: 12:00:00 25 mg via INTRAVENOUS

## 2019-07-30 MED ORDER — LORAZEPAM 2 MG/ML IJ SOLN
0.5000 mg | Freq: Once | INTRAMUSCULAR | Status: AC | PRN
  Administered 2019-07-30: 0.5 mg via INTRAVENOUS
  Filled 2019-07-30: qty 1

## 2019-07-30 MED ORDER — ONDANSETRON HCL 4 MG/2ML IJ SOLN
4.0000 mg | Freq: Once | INTRAMUSCULAR | Status: AC
  Administered 2019-07-30: 4 mg via INTRAVENOUS

## 2019-07-30 MED ORDER — DIPHENHYDRAMINE HCL 50 MG/ML IJ SOLN
25.0000 mg | Freq: Once | INTRAMUSCULAR | Status: AC
  Administered 2019-07-30: 25 mg via INTRAVENOUS
  Filled 2019-07-30: qty 1

## 2019-07-30 MED ORDER — SODIUM CHLORIDE 0.9 % IV SOLN
375.0000 mg/m2 | Freq: Once | INTRAVENOUS | Status: AC
  Administered 2019-07-30: 900 mg via INTRAVENOUS
  Filled 2019-07-30: qty 50

## 2019-07-30 MED ORDER — METHYLPREDNISOLONE SODIUM SUCC 125 MG IJ SOLR
125.0000 mg | Freq: Once | INTRAMUSCULAR | Status: AC | PRN
  Administered 2019-07-30: 125 mg via INTRAVENOUS

## 2019-07-30 MED ORDER — SODIUM CHLORIDE 0.9 % IV SOLN
Freq: Once | INTRAVENOUS | Status: AC
  Filled 2019-07-30: qty 250

## 2019-07-30 MED ORDER — SODIUM CHLORIDE 0.9 % IV SOLN
10.0000 mg | Freq: Once | INTRAVENOUS | Status: AC
  Administered 2019-07-30: 10 mg via INTRAVENOUS
  Filled 2019-07-30: qty 1

## 2019-07-30 MED ORDER — ACETAMINOPHEN 325 MG PO TABS
650.0000 mg | ORAL_TABLET | Freq: Once | ORAL | Status: AC
  Administered 2019-07-30: 650 mg via ORAL
  Filled 2019-07-30: qty 2

## 2019-07-30 NOTE — Progress Notes (Signed)
1210 pt c/o chest pain and flushed face with nausea, benadyrl 25mg  given IVP, solumedrol 125mg  IVP, zofran 4mg  IVP, BP 140/79 82 22 sats 97% RA, 1223 Beckey Rutter PA here at chair side, BP 114/75 78 93% RA, o2 at 2l/Tyler, still has little bit of chest pressure, IVF infusing at 500cc/hr. Will cont to monitor chest pressure and oxygen sats 1255 Ruxience restarted as ordered per MD, pt tolerating well so far. 1400 continues to tolerate infusion without c/o chest discomfort or any problems. 1500 continues without any problems

## 2019-07-30 NOTE — Progress Notes (Signed)
Infusion Reaction Progress Note  Called to infusion for patient report of nausea, flushing, and chest tightness consistent with grade 2 reaction to rituxan. Patient was on second rate change. Rituxan was stopped, fluids started. Benadryl, solu-medrol, and zofran were given. Patient had received pre-meds as well. One episode of dry heaving. Pressures stable. SpO2 93%. Gave supplemental oxygen primarily for comfort. Symptoms gradually improved over approximately 45 minutes. Discussed with Dr. Grayland Ormond who recommends rechallenge if symptoms resolve.   Patient reports previous reactions to rituxan in 2017. I reviewed her chart and it appears that she had similar symptoms despite pre-medications at that time as well but was ultimately able to tolerate treatments. She did not see an allergy specialist at that time.   Once symptoms resolved, I discussed risks vs benefits of same day re-challenge. I advised that the majority of patients with her type of symptoms/grade 2 reactions are able to tolerate the re-challenge however, risks of re-challenge vs more intense reactions that could require hospitalization/ER/death. Patient feels at baseline, eating lunch, and wishes to re-challenge.   Premeds included tylenol, lorazepam, benadryl, famotidine, and decadron.  Could consider adding montelukast to pre-meds with next infusion.

## 2019-07-31 NOTE — Progress Notes (Signed)
[place]  [phone]) [phone] [phone]) [phone]  ID: Daisy Black OB: 1951/03/18  MR#: [id number]  [id number]  Patient Care Team: Renee Rival, NP as PCP - General (Nurse Practitioner) Satira Sark, MD as PCP - Cardiology (Cardiology) Aviva Signs, MD (General Surgery) Rico Junker, RN as Registered Nurse Theodore Demark, RN as Registered Nurse Satira Sark, MD as Consulting Physician (Cardiology) Gala Romney Cristopher Estimable, MD as Consulting Physician (Gastroenterology)  CHIEF COMPLAINT: CLL  INTERVAL HISTORY: Patient returns to clinic today for repeat laboratory, further evaluation, and consideration of cycle 2 of weekly Rituxan.  She had a rate-based reaction with cycle 1, but subsequently recovered and was able to in finish her infusion.  She continues to have chronic weakness and fatigue, but otherwise feels well.  Her left flank pain and early satiety are unchanged.  She has no neurologic complaints.  She denies any fevers, chills, or night sweats.  She has no chest pain, shortness of breath, cough, or hemoptysis. She denies any vomiting, constipation, or diarrhea.  She has no urinary complaints.  Patient offers no further specific complaints today.  REVIEW OF SYSTEMS:   Review of Systems  Constitutional: Positive for malaise/fatigue. Negative for diaphoresis, fever and weight loss.  Respiratory: Negative.  Negative for cough and shortness of breath.   Cardiovascular: Negative.  Negative for chest pain and leg swelling.  Gastrointestinal: Negative.  Negative for abdominal pain, blood in stool, constipation, diarrhea, heartburn, melena, nausea and vomiting.  Genitourinary: Positive for flank pain.  Musculoskeletal: Negative for back pain.  Skin: Negative.  Negative for rash.  Neurological: Positive for weakness. Negative for dizziness, sensory change, focal weakness and headaches.  Psychiatric/Behavioral: Negative.  The patient is not  nervous/anxious.     As per HPI. Otherwise, a complete review of systems is negative.  PAST MEDICAL HISTORY: Past Medical History:  Diagnosis Date  . Anemia   . Arthritis   . Asthma   . CLL (chronic lymphocytic leukemia) ([place])   . Essential hypertension   . GERD (gastroesophageal reflux disease)   . History of hiatal hernia   . Hyperlipidemia   . Paroxysmal atrial fibrillation ([facility])   . Splenomegaly     PAST SURGICAL HISTORY: Past Surgical History:  Procedure Laterality Date  . ANKLE SURGERY Right    repair fracture  . CARDIOVERSION N/A 03/20/2016   Procedure: CARDIOVERSION;  Surgeon: Satira Sark, MD;  Location: [facility];  Service: Cardiovascular;  Laterality: N/A;  . CARDIOVERSION N/A 05/22/2016   Procedure: CARDIOVERSION;  Surgeon: Satira Sark, MD;  Location: [facility];  Service: Cardiovascular;  Laterality: N/A;  . CHOLECYSTECTOMY    . COLONOSCOPY WITH PROPOFOL N/A 03/27/2018   Internal hemorrhoids, diverticulosis in sigmoid and descending colon, three 4-6 mm polyps at IC valve. tubular adenomas 5 year surveillance  . ESOPHAGOGASTRODUODENOSCOPY (EGD) WITH PROPOFOL N/A 03/27/2018   Moderate Schatzki's ring s/p dilation, small hiatal hernia  . HERNIA REPAIR  2011   Incisional and umbilical utilizing mesh  . MALONEY DILATION N/A 03/27/2018   Procedure: Venia Minks DILATION;  Surgeon: Daneil Dolin, MD;  Location: [facility];  Service: Endoscopy;  Laterality: N/A;  . POLYPECTOMY  03/27/2018   Procedure: POLYPECTOMY;  Surgeon: Daneil Dolin, MD;  Location: [facility];  Service: Endoscopy;;  colon  . ROTATOR CUFF REPAIR Left   . TEE WITHOUT CARDIOVERSION N/A 01/17/2016   Procedure: TRANSESOPHAGEAL ECHOCARDIOGRAM (TEE);  Surgeon: Herminio Commons, MD;  Location: [facility];  Service: Cardiovascular;  Laterality: N/A;  . TEE WITHOUT CARDIOVERSION N/A 02/21/2016   Procedure: TRANSESOPHAGEAL ECHOCARDIOGRAM (TEE) WITH PROPOFOL;  Surgeon: Herminio Commons, MD;  Location: [facility];  Service: Cardiovascular;  Laterality: N/A;  . TEE WITHOUT CARDIOVERSION N/A 03/20/2016   Procedure: TRANSESOPHAGEAL ECHOCARDIOGRAM (TEE) WITH PROPOFOL;  Surgeon: Satira Sark, MD;  Location: [facility];  Service: Cardiovascular;  Laterality: N/A;  . TONSILLECTOMY      FAMILY HISTORY Family History  Problem Relation Age of Onset  . Ovarian cancer Mother 27       Secondary to ovarian cancer  . Leukemia Mother   . Stroke Father 21       Brain stem infarction  . Breast cancer Paternal Aunt   . Crohn's disease Son   . Colon cancer Neg Hx        ADVANCED DIRECTIVES:    HEALTH MAINTENANCE: Social History   Tobacco Use  . Smoking status: Former Smoker    Packs/day: 0.25    Years: 2.00    Pack years: 0.50    Types: Cigarettes    Quit date: 08/03/1975    Years since quitting: 44.0  . Smokeless tobacco: Never Used  Substance Use Topics  . Alcohol use: No    Alcohol/week: 0.0 standard drinks  . Drug use: No     Colonoscopy:  PAP:  Bone density:  Lipid panel:  Allergies  Allergen Reactions  . Flagyl [Metronidazole]     Classic side effect of severe nausea, unable to tolerate.     Current Outpatient Medications  Medication Sig Dispense Refill  . acetaminophen (TYLENOL) 650 MG CR tablet Take 650-1,300 mg by mouth daily as needed for pain.     Marland Kitchen apixaban (ELIQUIS) 5 MG TABS tablet Take 1 tablet (5 mg total) by mouth 2 (two) times daily. 28 tablet 0  . cetirizine (ZYRTEC) 10 MG tablet Take 10 mg by mouth daily as needed for allergies.    . Ferrous Gluconate-C-Folic Acid (IRON-C PO) Take 1 tablet by mouth daily.    . flecainide (TAMBOCOR) 50 MG tablet TAKE 1.5 TABLETS BY MOUTH TWO TIMES A DAY 270 tablet 3  . fluticasone (FLONASE) 50 MCG/ACT nasal spray Place 1 spray into both nostrils daily as needed for allergies or rhinitis.    . furosemide (LASIX) 20 MG tablet Take 2 tablets (40 mg total) by mouth daily. 60 tablet 6  . HYDROcodone-acetaminophen (NORCO/VICODIN) 5-325  MG tablet Take 1 tablet by mouth every 6 (six) hours as needed for moderate pain. 60 tablet 0  . levothyroxine (SYNTHROID, LEVOTHROID) 125 MCG tablet Take 125 mcg by mouth daily before breakfast.    . Melatonin 5 MG SUBL Place 1 tablet under the tongue as needed (for sleep).    . metoprolol tartrate (LOPRESSOR) 25 MG tablet TAKE ONE TABLET BY MOUTH TWO TIMES A DAY 180 tablet 1  . Multiple Vitamin (MULTIVITAMIN WITH MINERALS) TABS tablet Take 1 tablet by mouth daily.    . pantoprazole (PROTONIX) 40 MG tablet Take 40 mg by mouth daily.    . prochlorperazine (COMPAZINE) 10 MG tablet Take 1 tablet (10 mg total) by mouth every 6 (six) hours as needed for nausea or vomiting. 60 tablet 2  . promethazine (PHENERGAN) 25 MG tablet Take 1 tablet (25 mg total) by mouth every 6 (six) hours as needed for nausea or vomiting. 60 tablet 0   No current facility-administered medications for this visit.   Facility-Administered Medications Ordered in Other Visits  Medication Dose Route Frequency Provider Last Rate Last Admin  . 0.9 %  sodium chloride infusion   Intravenous Once Lloyd Huger, MD      . acetaminophen (TYLENOL) tablet 650 mg  650 mg Oral Once Lloyd Huger, MD      . dexamethasone (DECADRON) 10 mg in sodium chloride 0.9 % 50 mL IVPB  10 mg Intravenous Once Lloyd Huger, MD      . diphenhydrAMINE (BENADRYL) injection 25 mg  25 mg Intravenous Once Lloyd Huger, MD      . famotidine (PEPCID) IVPB 20 mg premix  20 mg Intravenous Once Lloyd Huger, MD      . hydrocortisone cream 1 % 1 application  1 application Topical TID PRN Satira Sark, MD      . LORazepam (ATIVAN) injection 0.5 mg  0.5 mg Intravenous Once PRN Lloyd Huger, MD      . riTUXimab-pvvr (RUXIENCE) 900 mg in sodium chloride 0.9 % 250 mL (2.6471 mg/mL) infusion  375 mg/m2 (Treatment Plan Recorded) Intravenous Once Lloyd Huger, MD        OBJECTIVE: Vitals:   08/06/19 0838  BP: 136/72    Pulse: 71  Resp: 20  Temp: (!) 96.7 F (35.9 C)  SpO2: 100%     Body mass index is 40.82 kg/m.    ECOG FS:1 - Symptomatic but completely ambulatory  General: Well-developed, well-nourished, no acute distress. Eyes: Pink conjunctiva, anicteric sclera. HEENT: Normocephalic, moist mucous membranes. Lungs: No audible wheezing or coughing. Heart: Regular rate and rhythm. Abdomen: Soft, nontender, no obvious distention. Musculoskeletal: No edema, cyanosis, or clubbing. Neuro: Alert, answering all questions appropriately. Cranial nerves grossly intact. Skin: No rashes or petechiae noted. Psych: Normal affect. Lymphatics: No palpable lymphadenopathy.  LAB RESULTS:  Lab Results  Component Value Date   NA 141 11/21/2017   K 4.2 11/21/2017   CL 108 11/21/2017   CO2 26 11/21/2017   GLUCOSE 90 11/21/2017   BUN 18 11/21/2017   CREATININE 0.80 07/02/2018   CALCIUM 10.2 11/21/2017   PROT 6.5 11/21/2017   ALBUMIN 4.2 11/21/2017   AST 20 11/21/2017   ALT 23 11/21/2017   ALKPHOS 92 11/21/2017   BILITOT 1.0 11/21/2017   GFRNONAA >60 11/21/2017   GFRAA >60 11/21/2017    Lab Results  Component Value Date   WBC 24.5 (H) 08/06/2019   NEUTROABS 3.1 08/06/2019   HGB 13.9 08/06/2019   HCT 45.9 08/06/2019   MCV 97.9 08/06/2019   PLT 119 (L) 08/06/2019     STUDIES: CT Chest W Contrast  Result Date: 07/13/2019 CLINICAL DATA:  Restaging CLL EXAM: CT CHEST, ABDOMEN, AND PELVIS WITH CONTRAST TECHNIQUE: Multidetector CT imaging of the chest, abdomen and pelvis was performed following the standard protocol during bolus administration of intravenous contrast. CONTRAST:  138mL OMNIPAQUE IOHEXOL 300 MG/ML SOLN, additional oral enteric contrast COMPARISON:  07/02/2018 FINDINGS: CT CHEST FINDINGS Cardiovascular: No significant vascular findings. Normal heart size. No pericardial effusion. Mediastinum/Nodes: Unchanged, diffuse mediastinal and bilateral axillary lymphadenopathy, largest unchanged  index node in the left axilla measuring 3.3 x 1.7 cm (series 2, image 23). Thyroid gland, trachea, and esophagus demonstrate no significant findings. Lungs/Pleura: Lungs are clear. No pleural effusion or pneumothorax. Musculoskeletal: No chest wall mass or suspicious bone lesions identified. CT ABDOMEN PELVIS FINDINGS Hepatobiliary: No focal liver abnormality is seen. Status post cholecystectomy. No biliary dilatation. Pancreas: Unremarkable. No pancreatic ductal dilatation or surrounding inflammatory changes. Spleen: Unchanged splenomegaly, maximum coronal  span 18.1 cm. Adrenals/Urinary Tract: Adrenal glands are unremarkable. Kidneys are normal, without renal calculi, solid lesion, or hydronephrosis. Bladder is unremarkable. Stomach/Bowel: Stomach is within normal limits. Appendix appears normal. No evidence of bowel wall thickening, distention, or inflammatory changes. Sigmoid diverticulosis. Vascular/Lymphatic: No significant vascular findings are present. Unchanged, diffuse gastrohepatic, periportal, retroperitoneal, iliac, pelvic sidewall, and inguinal lymphadenopathy, unchanged index right iliac node measuring 1.9 x 1.5 cm (series 2, image 86) and left inguinal node measuring 1.8 x 1.6 cm (series 2, image 113). Reproductive: No mass or other abnormality. Other: No abdominal wall hernia or abnormality. No abdominopelvic ascites. Musculoskeletal: No acute or significant osseous findings. IMPRESSION: 1. Unchanged diffuse lymphadenopathy throughout the chest, abdomen, and pelvis, index node measurements as above. 2. Unchanged splenomegaly, maximum coronal span 18.1 cm. Electronically Signed   By: Eddie Candle M.D.   On: 07/13/2019 13:26   CT Abdomen Pelvis W Contrast  Result Date: 07/13/2019 CLINICAL DATA:  Restaging CLL EXAM: CT CHEST, ABDOMEN, AND PELVIS WITH CONTRAST TECHNIQUE: Multidetector CT imaging of the chest, abdomen and pelvis was performed following the standard protocol during bolus  administration of intravenous contrast. CONTRAST:  148mL OMNIPAQUE IOHEXOL 300 MG/ML SOLN, additional oral enteric contrast COMPARISON:  07/02/2018 FINDINGS: CT CHEST FINDINGS Cardiovascular: No significant vascular findings. Normal heart size. No pericardial effusion. Mediastinum/Nodes: Unchanged, diffuse mediastinal and bilateral axillary lymphadenopathy, largest unchanged index node in the left axilla measuring 3.3 x 1.7 cm (series 2, image 23). Thyroid gland, trachea, and esophagus demonstrate no significant findings. Lungs/Pleura: Lungs are clear. No pleural effusion or pneumothorax. Musculoskeletal: No chest wall mass or suspicious bone lesions identified. CT ABDOMEN PELVIS FINDINGS Hepatobiliary: No focal liver abnormality is seen. Status post cholecystectomy. No biliary dilatation. Pancreas: Unremarkable. No pancreatic ductal dilatation or surrounding inflammatory changes. Spleen: Unchanged splenomegaly, maximum coronal span 18.1 cm. Adrenals/Urinary Tract: Adrenal glands are unremarkable. Kidneys are normal, without renal calculi, solid lesion, or hydronephrosis. Bladder is unremarkable. Stomach/Bowel: Stomach is within normal limits. Appendix appears normal. No evidence of bowel wall thickening, distention, or inflammatory changes. Sigmoid diverticulosis. Vascular/Lymphatic: No significant vascular findings are present. Unchanged, diffuse gastrohepatic, periportal, retroperitoneal, iliac, pelvic sidewall, and inguinal lymphadenopathy, unchanged index right iliac node measuring 1.9 x 1.5 cm (series 2, image 86) and left inguinal node measuring 1.8 x 1.6 cm (series 2, image 113). Reproductive: No mass or other abnormality. Other: No abdominal wall hernia or abnormality. No abdominopelvic ascites. Musculoskeletal: No acute or significant osseous findings. IMPRESSION: 1. Unchanged diffuse lymphadenopathy throughout the chest, abdomen, and pelvis, index node measurements as above. 2. Unchanged splenomegaly,  maximum coronal span 18.1 cm. Electronically Signed   By: Eddie Candle M.D.   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  PLAN:    1. CLL: She last received single agent Rituxan on May 17, 2016. Repeat imaging on July 13, 2019 reviewed independently and reported as above with essentially unchanged diffuse lymphadenopathy throughout the chest, abdomen, and pelvis.  Patient also continues to have persistent splenomegaly of 18 cm.  She received cycle 1 of Rituxan last week with significant improvement of her white blood cell count down to 24.5.  Her platelets have also improved to 119.  Patient had reaction with cycle 1, therefore she receives heavy premedications.  Proceed cautiously with cycle 2 today.  Return to clinic in 1 week for further evaluation and consideration of cycle 3 of 4.  2.  Early satiety, indigestion: Chronic and unchanged.  Likely secondary to splenomegaly.  3. Atrial fibrillation: Continue monitoring and treatment per cardiology. 4. Iron deficiency anemia: Patient's hemoglobin continues to be within normal limits.  She last received IV Feraheme in May 2017.  5. PET positive thyroid lesions: 1.2 cm right lobe nodule also seen on thyroid ultrasound April 06, 2016.  Continue follow-up with ENT as indicated. 6.  Pain: Patient does not complain of this today. 7.  Nausea: Patient does not complain of this today.  Continue Compazine as needed.  Patient expressed understanding and was in agreement with this plan. She also understands that She can call clinic at any time with any questions, concerns, or complaints.     Lloyd Huger, MD   08/06/2019 9:11 AM

## 2019-08-05 ENCOUNTER — Encounter: Payer: Self-pay | Admitting: Oncology

## 2019-08-06 ENCOUNTER — Ambulatory Visit: Payer: Medicare Other

## 2019-08-06 ENCOUNTER — Inpatient Hospital Stay (HOSPITAL_BASED_OUTPATIENT_CLINIC_OR_DEPARTMENT_OTHER): Payer: Medicare Other | Admitting: Oncology

## 2019-08-06 ENCOUNTER — Inpatient Hospital Stay: Payer: Medicare Other

## 2019-08-06 ENCOUNTER — Inpatient Hospital Stay: Payer: Medicare Other | Attending: Oncology

## 2019-08-06 ENCOUNTER — Other Ambulatory Visit: Payer: Self-pay

## 2019-08-06 VITALS — BP 106/58 | HR 75 | Temp 97.0°F | Resp 18

## 2019-08-06 VITALS — BP 136/72 | HR 71 | Temp 96.7°F | Resp 20 | Wt 260.6 lb

## 2019-08-06 DIAGNOSIS — Z5112 Encounter for antineoplastic immunotherapy: Secondary | ICD-10-CM | POA: Diagnosis present

## 2019-08-06 DIAGNOSIS — Z79899 Other long term (current) drug therapy: Secondary | ICD-10-CM | POA: Diagnosis not present

## 2019-08-06 DIAGNOSIS — C9110 Chronic lymphocytic leukemia of B-cell type not having achieved remission: Secondary | ICD-10-CM

## 2019-08-06 LAB — CBC WITH DIFFERENTIAL/PLATELET
Abs Immature Granulocytes: 0.04 10*3/uL (ref 0.00–0.07)
Basophils Absolute: 0.1 10*3/uL (ref 0.0–0.1)
Basophils Relative: 0 %
Eosinophils Absolute: 0.3 10*3/uL (ref 0.0–0.5)
Eosinophils Relative: 1 %
HCT: 45.9 % (ref 36.0–46.0)
Hemoglobin: 13.9 g/dL (ref 12.0–15.0)
Immature Granulocytes: 0 %
Lymphocytes Relative: 84 %
Lymphs Abs: 20.7 10*3/uL — ABNORMAL HIGH (ref 0.7–4.0)
MCH: 29.6 pg (ref 26.0–34.0)
MCHC: 30.3 g/dL (ref 30.0–36.0)
MCV: 97.9 fL (ref 80.0–100.0)
Monocytes Absolute: 0.4 10*3/uL (ref 0.1–1.0)
Monocytes Relative: 2 %
Neutro Abs: 3.1 10*3/uL (ref 1.7–7.7)
Neutrophils Relative %: 13 %
Platelets: 119 10*3/uL — ABNORMAL LOW (ref 150–400)
RBC: 4.69 MIL/uL (ref 3.87–5.11)
RDW: 14.2 % (ref 11.5–15.5)
Smear Review: NORMAL
WBC: 24.5 10*3/uL — ABNORMAL HIGH (ref 4.0–10.5)
nRBC: 0 % (ref 0.0–0.2)

## 2019-08-06 MED ORDER — SODIUM CHLORIDE 0.9 % IV SOLN
10.0000 mg | Freq: Once | INTRAVENOUS | Status: AC
  Administered 2019-08-06: 10:00:00 10 mg via INTRAVENOUS
  Filled 2019-08-06: qty 10

## 2019-08-06 MED ORDER — FAMOTIDINE IN NACL 20-0.9 MG/50ML-% IV SOLN
20.0000 mg | Freq: Once | INTRAVENOUS | Status: AC
  Administered 2019-08-06: 20 mg via INTRAVENOUS
  Filled 2019-08-06: qty 50

## 2019-08-06 MED ORDER — DIPHENHYDRAMINE HCL 50 MG/ML IJ SOLN
25.0000 mg | Freq: Once | INTRAMUSCULAR | Status: AC
  Administered 2019-08-06: 10:00:00 25 mg via INTRAVENOUS
  Filled 2019-08-06: qty 1

## 2019-08-06 MED ORDER — SODIUM CHLORIDE 0.9 % IV SOLN
375.0000 mg/m2 | Freq: Once | INTRAVENOUS | Status: AC
  Administered 2019-08-06: 11:00:00 900 mg via INTRAVENOUS
  Filled 2019-08-06: qty 50

## 2019-08-06 MED ORDER — LORAZEPAM 2 MG/ML IJ SOLN
0.5000 mg | Freq: Once | INTRAMUSCULAR | Status: AC | PRN
  Administered 2019-08-06: 11:00:00 0.5 mg via INTRAVENOUS
  Filled 2019-08-06: qty 1

## 2019-08-06 MED ORDER — ACETAMINOPHEN 325 MG PO TABS
650.0000 mg | ORAL_TABLET | Freq: Once | ORAL | Status: AC
  Administered 2019-08-06: 10:00:00 650 mg via ORAL
  Filled 2019-08-06: qty 2

## 2019-08-06 MED ORDER — SODIUM CHLORIDE 0.9 % IV SOLN
Freq: Once | INTRAVENOUS | Status: AC
  Filled 2019-08-06: qty 250

## 2019-08-06 NOTE — Progress Notes (Signed)
Pt tolerated Ruxience infusion well with no signs of complications or reactions. Vitals stable throughout infusion and prior to discharge. *see flowsheet for vitals* RN educated pt on the importance of notifying the clinic if any complications occur at home and if it is an emergency to call 911. Pt verbalized understanding and all questions answered at this time. Pt stable for discharge.   Darnise Montag [employer]

## 2019-08-08 NOTE — Progress Notes (Signed)
[name]  [phone]) [phone] [phone]) [phone]  ID: Daisy Black OB: [birth year]  MR#: [id number]  [id number]  Patient Care Team: Renee Rival, NP as PCP - General (Nurse Practitioner) Satira Sark, MD as PCP - Cardiology (Cardiology) Aviva Signs, MD (General Surgery) Rico Junker, RN as Registered Nurse Theodore Demark, RN as Registered Nurse Satira Sark, MD as Consulting Physician (Cardiology) Gala Romney Cristopher Estimable, MD as Consulting Physician (Gastroenterology)  CHIEF COMPLAINT: CLL  INTERVAL HISTORY: Patient returns to clinic today for repeat laboratory work, further evaluation, and consideration of cycle 3 of weekly Rituxan.  She continues to have chronic weakness and fatigue, but admits it has improved this week.  Her left flank pain and early satiety have also improved.  She has no neurologic complaints.  She denies any fevers, chills, or night sweats.  She has no chest pain, shortness of breath, cough, or hemoptysis. She denies any nausea, vomiting, constipation, or diarrhea.  She has no urinary complaints.  Patient offers no further specific complaints today.  REVIEW OF SYSTEMS:   Review of Systems  Constitutional: Positive for malaise/fatigue. Negative for diaphoresis, fever and weight loss.  Respiratory: Negative.  Negative for cough and shortness of breath.   Cardiovascular: Negative.  Negative for chest pain and leg swelling.  Gastrointestinal: Negative.  Negative for abdominal pain, blood in stool, constipation, diarrhea, heartburn, melena, nausea and vomiting.  Genitourinary: Positive for flank pain.  Musculoskeletal: Negative for back pain.  Skin: Negative.  Negative for rash.  Neurological: Positive for weakness. Negative for dizziness, sensory change, focal weakness and headaches.  Psychiatric/Behavioral: Negative.  The patient is not nervous/anxious.     As per HPI. Otherwise, a complete review of systems is  negative.  PAST MEDICAL HISTORY: Past Medical History:  Diagnosis Date  . Anemia   . Arthritis   . Asthma   . CLL (chronic lymphocytic leukemia) ([place])   . Essential hypertension   . GERD (gastroesophageal reflux disease)   . History of hiatal hernia   . Hyperlipidemia   . Paroxysmal atrial fibrillation (H[facility])   . Splenomegaly     PAST SURGICAL HISTORY: Past Surgical History:  Procedure Laterality Date  . ANKLE SURGERY Right    repair fracture  . CARDIOVERSION N/A 03/20/2016   Procedure: CARDIOVERSION;  Surgeon: Satira Sark, MD;  Location: [facility];  Service: Cardiovascular;  Laterality: N/A;  . CARDIOVERSION N/A 05/22/2016   Procedure: CARDIOVERSION;  Surgeon: Satira Sark, MD;  Location: [facility];  Service: Cardiovascular;  Laterality: N/A;  . CHOLECYSTECTOMY    . COLONOSCOPY WITH PROPOFOL N/A 03/27/2018   Internal hemorrhoids, diverticulosis in sigmoid and descending colon, three 4-6 mm polyps at IC valve. tubular adenomas 5 year surveillance  . ESOPHAGOGASTRODUODENOSCOPY (EGD) WITH PROPOFOL N/A 03/27/2018   Moderate Schatzki's ring s/p dilation, small hiatal hernia  . HERNIA REPAIR  2011   Incisional and umbilical utilizing mesh  . MALONEY DILATION N/A 03/27/2018   Procedure: Venia Minks DILATION;  Surgeon: Daneil Dolin, MD;  [facility];  Service: Endoscopy;  Laterality: N/A;  . POLYPECTOMY  03/27/2018   Procedure: POLYPECTOMY;  Surgeon: Daneil Dolin, MD;  [facility];  Service: Endoscopy;;  colon  . ROTATOR CUFF REPAIR Left   . TEE WITHOUT CARDIOVERSION N/A 01/17/2016   Procedure: TRANSESOPHAGEAL ECHOCARDIOGRAM (TEE);  Surgeon: Herminio Commons, MD;  Location: [facility];  Service: Cardiovascular;  Laterality: N/A;  . TEE WITHOUT CARDIOVERSION N/A 02/21/2016  Procedure: TRANSESOPHAGEAL ECHOCARDIOGRAM (TEE) WITH PROPOFOL;  Surgeon: Herminio Commons, MD;  [facility]: [facility];  Service: Cardiovascular;  Laterality: N/A;  . TEE WITHOUT CARDIOVERSION  N/A 03/20/2016   Procedure: TRANSESOPHAGEAL ECHOCARDIOGRAM (TEE) WITH PROPOFOL;  Surgeon: Satira Sark, MD;  Location: [facility];  Service: Cardiovascular;  Laterality: N/A;  . TONSILLECTOMY      FAMILY HISTORY Family History  Problem Relation Age of Onset  . Ovarian cancer Mother 23       Secondary to ovarian cancer  . Leukemia Mother   . Stroke Father 52       Brain stem infarction  . Breast cancer Paternal Aunt   . Crohn's disease Son   . Colon cancer Neg Hx        ADVANCED DIRECTIVES:    HEALTH MAINTENANCE: Social History   Tobacco Use  . Smoking status: Former Smoker    Packs/day: 0.25    Years: 2.00    Pack years: 0.50    Types: Cigarettes    Quit date: 08/03/1975    Years since quitting: 44.0  . Smokeless tobacco: Never Used  Substance Use Topics  . Alcohol use: No    Alcohol/week: 0.0 standard drinks  . Drug use: No     Colonoscopy:  PAP:  Bone density:  Lipid panel:  Allergies  Allergen Reactions  . Flagyl [Metronidazole]     Classic side effect of severe nausea, unable to tolerate.     Current Outpatient Medications  Medication Sig Dispense Refill  . acetaminophen (TYLENOL) 650 MG CR tablet Take 650-1,300 mg by mouth daily as needed for pain.     Marland Kitchen apixaban (ELIQUIS) 5 MG TABS tablet Take 1 tablet (5 mg total) by mouth 2 (two) times daily. 28 tablet 0  . cetirizine (ZYRTEC) 10 MG tablet Take 10 mg by mouth daily as needed for allergies.    . Ferrous Gluconate-C-Folic Acid (IRON-C PO) Take 1 tablet by mouth daily.    . flecainide (TAMBOCOR) 50 MG tablet TAKE 1.5 TABLETS BY MOUTH TWO TIMES A DAY 270 tablet 3  . fluticasone (FLONASE) 50 MCG/ACT nasal spray Place 1 spray into both nostrils daily as needed for allergies or rhinitis.    . furosemide (LASIX) 20 MG tablet Take 2 tablets (40 mg total) by mouth daily. 60 tablet 6  . HYDROcodone-acetaminophen (NORCO/VICODIN) 5-325 MG tablet Take 1 tablet by mouth every 6 (six) hours as needed for moderate  pain. 60 tablet 0  . levothyroxine (SYNTHROID, LEVOTHROID) 125 MCG tablet Take 125 mcg by mouth daily before breakfast.    . Melatonin 5 MG SUBL Place 1 tablet under the tongue as needed (for sleep).    . metoprolol tartrate (LOPRESSOR) 25 MG tablet TAKE ONE TABLET BY MOUTH TWO TIMES A DAY 180 tablet 1  . Multiple Vitamin (MULTIVITAMIN WITH MINERALS) TABS tablet Take 1 tablet by mouth daily.    . pantoprazole (PROTONIX) 40 MG tablet Take 40 mg by mouth daily.    . prochlorperazine (COMPAZINE) 10 MG tablet Take 1 tablet (10 mg total) by mouth every 6 (six) hours as needed for nausea or vomiting. 60 tablet 2  . promethazine (PHENERGAN) 25 MG tablet Take 1 tablet (25 mg total) by mouth every 6 (six) hours as needed for nausea or vomiting. 60 tablet 0   No current facility-administered medications for this visit.   Facility-Administered Medications Ordered in Other Visits  Medication Dose Route Frequency Provider Last Rate Last Admin  . hydrocortisone cream 1 %  1 application  1 application Topical TID PRN Satira Sark, MD        OBJECTIVE: Vitals:   08/13/19 0926  BP: 133/62  Pulse: 79  Resp: 18  Temp: 97.8 F (36.6 C)  SpO2: 100%     Body mass index is 41.04 kg/m.    ECOG FS:1 - Symptomatic but completely ambulatory  General: Well-developed, well-nourished, no acute distress. Eyes: Pink conjunctiva, anicteric sclera. HEENT: Normocephalic, moist mucous membranes.  No palpable lymphadenopathy. Lungs: No audible wheezing or coughing. Heart: Regular rate and rhythm. Abdomen: Soft, nontender, no obvious distention. Musculoskeletal: No edema, cyanosis, or clubbing. Neuro: Alert, answering all questions appropriately. Cranial nerves grossly intact. Skin: No rashes or petechiae noted. Psych: Normal affect.   LAB RESULTS:  Lab Results  Component Value Date   NA 141 11/21/2017   K 4.2 11/21/2017   CL 108 11/21/2017   CO2 26 11/21/2017   GLUCOSE 90 11/21/2017   BUN 18  11/21/2017   CREATININE 0.80 07/02/2018   CALCIUM 10.2 11/21/2017   PROT 6.5 11/21/2017   ALBUMIN 4.2 11/21/2017   AST 20 11/21/2017   ALT 23 11/21/2017   ALKPHOS 92 11/21/2017   BILITOT 1.0 11/21/2017   GFRNONAA >60 11/21/2017   GFRAA >60 11/21/2017    Lab Results  Component Value Date   WBC 20.2 (H) 08/13/2019   NEUTROABS PENDING 08/13/2019   HGB 13.4 08/13/2019   HCT 44.4 08/13/2019   MCV 97.8 08/13/2019   PLT 111 (L) 08/13/2019     STUDIES: No results found.  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  PLAN:    1. CLL: She last received single agent Rituxan on May 17, 2016. Repeat imaging on July 13, 2019 reviewed independently with essentially unchanged diffuse lymphadenopathy throughout the chest, abdomen, and pelvis.  Patient also continues to have persistent splenomegaly of 18 cm.   Patient had reaction with cycle 1, therefore she receives heavy premedications.  Proceed with cycle 3 of treatment today.  Return to clinic in 1 week for further evaluation and consideration of cycle 4 of 4.  2.  Early satiety, indigestion: Improved.  Likely secondary to splenomegaly.  3. Atrial fibrillation: Continue monitoring and treatment per cardiology. 4. Iron deficiency anemia: Patient's hemoglobin continues to be within normal limits.  She last received IV Feraheme in May 2017.  5. PET positive thyroid lesions: 1.2 cm right lobe nodule also seen on thyroid ultrasound April 06, 2016.  Continue follow-up with ENT as indicated. 6.  Pain: Patient does not complain of this today. 7.  Nausea: Patient does not complain of this today.  Continue Phenergan as needed.  Patient expressed understanding and was in agreement with this plan. She also understands that She can call clinic at any time with any questions, concerns, or complaints.     Lloyd Huger, MD   08/13/2019 9:45 AM

## 2019-08-13 ENCOUNTER — Inpatient Hospital Stay: Payer: Medicare Other

## 2019-08-13 ENCOUNTER — Encounter: Payer: Self-pay | Admitting: Oncology

## 2019-08-13 ENCOUNTER — Other Ambulatory Visit: Payer: Self-pay | Admitting: Emergency Medicine

## 2019-08-13 ENCOUNTER — Other Ambulatory Visit: Payer: Self-pay

## 2019-08-13 ENCOUNTER — Ambulatory Visit: Payer: Medicare Other

## 2019-08-13 ENCOUNTER — Inpatient Hospital Stay (HOSPITAL_BASED_OUTPATIENT_CLINIC_OR_DEPARTMENT_OTHER): Payer: Medicare Other | Admitting: Oncology

## 2019-08-13 VITALS — BP 133/62 | HR 79 | Temp 97.8°F | Resp 18 | Wt 262.0 lb

## 2019-08-13 VITALS — BP 110/78 | HR 69 | Resp 18

## 2019-08-13 DIAGNOSIS — C9110 Chronic lymphocytic leukemia of B-cell type not having achieved remission: Secondary | ICD-10-CM

## 2019-08-13 DIAGNOSIS — Z5112 Encounter for antineoplastic immunotherapy: Secondary | ICD-10-CM | POA: Diagnosis not present

## 2019-08-13 LAB — CBC WITH DIFFERENTIAL/PLATELET
Abs Immature Granulocytes: 0 10*3/uL (ref 0.00–0.07)
Basophils Absolute: 0 10*3/uL (ref 0.0–0.1)
Basophils Relative: 0 %
Eosinophils Absolute: 0.4 10*3/uL (ref 0.0–0.5)
Eosinophils Relative: 2 %
HCT: 44.4 % (ref 36.0–46.0)
Hemoglobin: 13.4 g/dL (ref 12.0–15.0)
Lymphocytes Relative: 88 %
Lymphs Abs: 17.8 10*3/uL — ABNORMAL HIGH (ref 0.7–4.0)
MCH: 29.5 pg (ref 26.0–34.0)
MCHC: 30.2 g/dL (ref 30.0–36.0)
MCV: 97.8 fL (ref 80.0–100.0)
Monocytes Absolute: 0.6 10*3/uL (ref 0.1–1.0)
Monocytes Relative: 3 %
Neutro Abs: 1.4 10*3/uL — ABNORMAL LOW (ref 1.7–7.7)
Neutrophils Relative %: 7 %
Platelets: 111 10*3/uL — ABNORMAL LOW (ref 150–400)
RBC: 4.54 MIL/uL (ref 3.87–5.11)
RDW: 14.6 % (ref 11.5–15.5)
Smear Review: NORMAL
WBC Morphology: ABNORMAL
WBC: 20.2 10*3/uL — ABNORMAL HIGH (ref 4.0–10.5)
nRBC: 0.2 % (ref 0.0–0.2)

## 2019-08-13 MED ORDER — SODIUM CHLORIDE 0.9 % IV SOLN
10.0000 mg | Freq: Once | INTRAVENOUS | Status: AC
  Administered 2019-08-13: 10 mg via INTRAVENOUS
  Filled 2019-08-13: qty 10

## 2019-08-13 MED ORDER — ACETAMINOPHEN 325 MG PO TABS
650.0000 mg | ORAL_TABLET | Freq: Once | ORAL | Status: AC
  Administered 2019-08-13: 650 mg via ORAL
  Filled 2019-08-13: qty 2

## 2019-08-13 MED ORDER — DIPHENHYDRAMINE HCL 50 MG/ML IJ SOLN
25.0000 mg | Freq: Once | INTRAMUSCULAR | Status: AC
  Administered 2019-08-13: 25 mg via INTRAVENOUS
  Filled 2019-08-13: qty 1

## 2019-08-13 MED ORDER — PROMETHAZINE HCL 25 MG PO TABS: 25.0000 mg | ORAL_TABLET | Freq: Four times a day (QID) | ORAL | 1 refills | Status: DC | PRN

## 2019-08-13 MED ORDER — SODIUM CHLORIDE 0.9 % IV SOLN: 375.0000 mg/m2 | Freq: Once | INTRAVENOUS | Status: DC

## 2019-08-13 MED ORDER — SODIUM CHLORIDE 0.9 % IV SOLN
375.0000 mg/m2 | Freq: Once | INTRAVENOUS | Status: AC
  Administered 2019-08-13: 900 mg via INTRAVENOUS
  Filled 2019-08-13: qty 40

## 2019-08-13 MED ORDER — SODIUM CHLORIDE 0.9 % IV SOLN
Freq: Once | INTRAVENOUS | Status: AC
  Filled 2019-08-13: qty 250

## 2019-08-13 MED ORDER — FAMOTIDINE IN NACL 20-0.9 MG/50ML-% IV SOLN
20.0000 mg | Freq: Once | INTRAVENOUS | Status: AC
  Administered 2019-08-13: 20 mg via INTRAVENOUS
  Filled 2019-08-13: qty 50

## 2019-08-13 MED ORDER — PROMETHAZINE HCL 25 MG PO TABS: 25.0000 mg | ORAL_TABLET | Freq: Four times a day (QID) | ORAL | 0 refills | Status: DC | PRN

## 2019-08-13 MED ORDER — LORAZEPAM 2 MG/ML IJ SOLN
0.5000 mg | Freq: Once | INTRAMUSCULAR | Status: AC | PRN
  Administered 2019-08-13: 0.5 mg via INTRAVENOUS
  Filled 2019-08-13: qty 1

## 2019-08-13 NOTE — Progress Notes (Signed)
Mrs. Reder tolerated the Ruxience treatment without any complications today. Vital signs stable throughout treatment and at discharge.

## 2019-08-14 NOTE — Progress Notes (Signed)
[place]  Telephone:(336) [phone] Fax:(336) [phone]  ID: Daisy Black OB: 1951-04-09  MR#: [id number]  [id number]  Patient Care Team: Renee Rival, NP as PCP - General (Nurse Practitioner) Satira Sark, MD as PCP - Cardiology (Cardiology) Aviva Signs, MD (General Surgery) Rico Junker, RN as Registered Nurse Theodore Demark, RN as Registered Nurse Satira Sark, MD as Consulting Physician (Cardiology) Gala Romney Cristopher Estimable, MD as Consulting Physician (Gastroenterology)  CHIEF COMPLAINT: CLL  INTERVAL HISTORY: Patient returns to clinic today for repeat laboratory, further evaluation, and consideration of cycle 4 of 4 of weekly Rituxan.  She continues to have chronic weakness and fatigue, but admits this continues to improve.  She no longer complains of left flank pain or early satiety.  She has no neurologic complaints.  She denies any fevers, chills, or night sweats.  She has no chest pain, shortness of breath, cough, or hemoptysis. She denies any nausea, vomiting, constipation, or diarrhea.  She has no urinary complaints.  Patient offers no further specific complaints today.    REVIEW OF SYSTEMS:   Review of Systems  Constitutional: Positive for malaise/fatigue. Negative for diaphoresis, fever and weight loss.  Respiratory: Negative.  Negative for cough and shortness of breath.   Cardiovascular: Negative.  Negative for chest pain and leg swelling.  Gastrointestinal: Negative.  Negative for abdominal pain, blood in stool, constipation, diarrhea, heartburn, melena, nausea and vomiting.  Genitourinary: Negative.  Negative for flank pain.  Musculoskeletal: Negative for back pain.  Skin: Negative.  Negative for rash.  Neurological: Positive for weakness. Negative for dizziness, sensory change, focal weakness and headaches.  Psychiatric/Behavioral: Negative.  The patient is not nervous/anxious.     As per HPI. Otherwise, a complete review of  systems is negative.  PAST MEDICAL HISTORY: Past Medical History:  Diagnosis Date  . Anemia   . Arthritis   . Asthma   . CLL (chronic lymphocytic leukemia) ([place])   . Essential hypertension   . GERD (gastroesophageal reflux disease)   . History of hiatal hernia   . Hyperlipidemia   . Paroxysmal atrial fibrillation ([facility])   . Splenomegaly     PAST SURGICAL HISTORY: Past Surgical History:  Procedure Laterality Date  . ANKLE SURGERY Right    repair fracture  . CARDIOVERSION N/A 03/20/2016   Procedure: CARDIOVERSION;  Surgeon: Satira Sark, MD;  [facility]: [facility];  Service: Cardiovascular;  Laterality: N/A;  . CARDIOVERSION N/A 05/22/2016   Procedure: CARDIOVERSION;  Surgeon: Satira Sark, MD;  Location: [facility];  Service: Cardiovascular;  Laterality: N/A;  . CHOLECYSTECTOMY    . COLONOSCOPY WITH PROPOFOL N/A 03/27/2018   Internal hemorrhoids, diverticulosis in sigmoid and descending colon, three 4-6 mm polyps at IC valve. tubular adenomas 5 year surveillance  . ESOPHAGOGASTRODUODENOSCOPY (EGD) WITH PROPOFOL N/A 03/27/2018   Moderate Schatzki's ring s/p dilation, small hiatal hernia  . HERNIA REPAIR  2011   Incisional and umbilical utilizing mesh  . MALONEY DILATION N/A 03/27/2018   Procedure: Venia Minks DILATION;  Surgeon: Daneil Dolin, MD;  [facility];  Service: Endoscopy;  Laterality: N/A;  . POLYPECTOMY  03/27/2018   Procedure: POLYPECTOMY;  Surgeon: Daneil Dolin, MD;  [facility];  Service: Endoscopy;;  colon  . ROTATOR CUFF REPAIR Left   . TEE WITHOUT CARDIOVERSION N/A 01/17/2016   Procedure: TRANSESOPHAGEAL ECHOCARDIOGRAM (TEE);  Surgeon: Herminio Commons, MD;  [facility];  Service: Cardiovascular;  Laterality: N/A;  . TEE  WITHOUT CARDIOVERSION N/A 02/21/2016   Procedure: TRANSESOPHAGEAL ECHOCARDIOGRAM (TEE) WITH PROPOFOL;  Surgeon: Herminio Commons, MD;  [facility];  Service: Cardiovascular;  Laterality: N/A;  . TEE WITHOUT  CARDIOVERSION N/A 03/20/2016   Procedure: TRANSESOPHAGEAL ECHOCARDIOGRAM (TEE) WITH PROPOFOL;  Surgeon: Satira Sark, MD;  Location: [facility];  Service: Cardiovascular;  Laterality: N/A;  . TONSILLECTOMY      FAMILY HISTORY Family History  Problem Relation Age of Onset  . Ovarian cancer Mother 6       Secondary to ovarian cancer  . Leukemia Mother   . Stroke Father 83       Brain stem infarction  . Breast cancer Paternal Aunt   . Crohn's disease Son   . Colon cancer Neg Hx        ADVANCED DIRECTIVES:    HEALTH MAINTENANCE: Social History   Tobacco Use  . Smoking status: Former Smoker    Packs/day: 0.25    Years: 2.00    Pack years: 0.50    Types: Cigarettes    Quit date: 08/03/1975    Years since quitting: 44.0  . Smokeless tobacco: Never Used  Substance Use Topics  . Alcohol use: No    Alcohol/week: 0.0 standard drinks  . Drug use: No     Colonoscopy:  PAP:  Bone density:  Lipid panel:  Allergies  Allergen Reactions  . Flagyl [Metronidazole]     Classic side effect of severe nausea, unable to tolerate.     Current Outpatient Medications  Medication Sig Dispense Refill  . acetaminophen (TYLENOL) 650 MG CR tablet Take 650-1,300 mg by mouth daily as needed for pain.     Marland Kitchen apixaban (ELIQUIS) 5 MG TABS tablet Take 1 tablet (5 mg total) by mouth 2 (two) times daily. 28 tablet 0  . cetirizine (ZYRTEC) 10 MG tablet Take 10 mg by mouth daily as needed for allergies.    . Ferrous Gluconate-C-Folic Acid (IRON-C PO) Take 1 tablet by mouth daily.    . flecainide (TAMBOCOR) 50 MG tablet TAKE 1.5 TABLETS BY MOUTH TWO TIMES A DAY 270 tablet 3  . fluticasone (FLONASE) 50 MCG/ACT nasal spray Place 1 spray into both nostrils daily as needed for allergies or rhinitis.    . furosemide (LASIX) 20 MG tablet Take 2 tablets (40 mg total) by mouth daily. 60 tablet 6  . HYDROcodone-acetaminophen (NORCO/VICODIN) 5-325 MG tablet Take 1 tablet by mouth every 6 (six) hours as needed  for moderate pain. 60 tablet 0  . levothyroxine (SYNTHROID, LEVOTHROID) 125 MCG tablet Take 125 mcg by mouth daily before breakfast.    . Melatonin 5 MG SUBL Place 1 tablet under the tongue as needed (for sleep).    . metoprolol tartrate (LOPRESSOR) 25 MG tablet TAKE ONE TABLET BY MOUTH TWO TIMES A DAY 180 tablet 1  . Multiple Vitamin (MULTIVITAMIN WITH MINERALS) TABS tablet Take 1 tablet by mouth daily.    . pantoprazole (PROTONIX) 40 MG tablet Take 40 mg by mouth daily.    . prochlorperazine (COMPAZINE) 10 MG tablet Take 1 tablet (10 mg total) by mouth every 6 (six) hours as needed for nausea or vomiting. 60 tablet 2  . promethazine (PHENERGAN) 25 MG tablet Take 1 tablet (25 mg total) by mouth every 6 (six) hours as needed for nausea or vomiting. 60 tablet 1   No current facility-administered medications for this visit.   Facility-Administered Medications Ordered in Other Visits  Medication Dose Route Frequency Provider Last Rate Last  Admin  . hydrocortisone cream 1 % 1 application  1 application Topical TID PRN Satira Sark, MD        OBJECTIVE: Vitals:   08/20/19 0919  BP: 136/76  Pulse: 72  Resp: 18  Temp: 97.7 F (36.5 C)     Body mass index is 41.22 kg/m.    ECOG FS:1 - Symptomatic but completely ambulatory  General: Well-developed, well-nourished, no acute distress. Eyes: Pink conjunctiva, anicteric sclera. HEENT: Normocephalic, moist mucous membranes.  No palpable lymphadenopathy. Lungs: No audible wheezing or coughing. Heart: Regular rate and rhythm. Abdomen: Soft, nontender, no obvious distention. Musculoskeletal: No edema, cyanosis, or clubbing. Neuro: Alert, answering all questions appropriately. Cranial nerves grossly intact. Skin: No rashes or petechiae noted. Psych: Normal affect.   LAB RESULTS:  Lab Results  Component Value Date   NA 141 11/21/2017   K 4.2 11/21/2017   CL 108 11/21/2017   CO2 26 11/21/2017   GLUCOSE 90 11/21/2017   BUN 18  11/21/2017   CREATININE 0.80 07/02/2018   CALCIUM 10.2 11/21/2017   PROT 6.5 11/21/2017   ALBUMIN 4.2 11/21/2017   AST 20 11/21/2017   ALT 23 11/21/2017   ALKPHOS 92 11/21/2017   BILITOT 1.0 11/21/2017   GFRNONAA >60 11/21/2017   GFRAA >60 11/21/2017    Lab Results  Component Value Date   WBC 13.5 (H) 08/20/2019   NEUTROABS 2.6 08/20/2019   HGB 13.5 08/20/2019   HCT 44.6 08/20/2019   MCV 98.7 08/20/2019   PLT 110 (L) 08/20/2019     STUDIES: No results found.  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  PLAN:    1. CLL: She last received single agent Rituxan on May 17, 2016. Repeat imaging on July 13, 2019 reviewed independently with essentially unchanged diffuse lymphadenopathy throughout the chest, abdomen, and pelvis.  Patient also continues to have persistent splenomegaly of 18 cm.   Patient had reaction with cycle 1, therefore she receives heavy premedications.  Patient's white blood cell count has nearly normalized to 13.5.  Proceed with cycle 4 of 4 of weekly Rituxan today.  Return to clinic in 6 weeks for laboratory work only and then in 3 months for laboratory work, repeat imaging, and further evaluation. 2.  Early satiety, indigestion: Improved.  Likely secondary to splenomegaly.  3.  Atrial fibrillation: Continue monitoring and treatment per cardiology. 4. Iron deficiency anemia: Patient's hemoglobin continues to be within normal limits.  She last received IV Feraheme in May 2017.  5. PET positive thyroid lesions: 1.2 cm right lobe nodule also seen on thyroid ultrasound April 06, 2016.  Continue follow-up with ENT as indicated. 6.  Pain: Patient does not complain of this today. 7.  Nausea: Patient does not complain of this today.  Continue Phenergan as needed. 8.  Thrombocytopenia: Mild, monitor.  Proceed with treatment as above.  Patient expressed understanding and was in agreement with this plan. She also understands that  She can call clinic at any time with any questions, concerns, or complaints.     Lloyd Huger, MD   08/20/2019 2:35 PM

## 2019-08-20 ENCOUNTER — Other Ambulatory Visit: Payer: Self-pay

## 2019-08-20 ENCOUNTER — Inpatient Hospital Stay (HOSPITAL_BASED_OUTPATIENT_CLINIC_OR_DEPARTMENT_OTHER): Payer: Medicare Other | Admitting: Oncology

## 2019-08-20 ENCOUNTER — Inpatient Hospital Stay: Payer: Medicare Other

## 2019-08-20 ENCOUNTER — Ambulatory Visit: Payer: Medicare Other

## 2019-08-20 ENCOUNTER — Encounter: Payer: Self-pay | Admitting: Oncology

## 2019-08-20 VITALS — BP 123/50 | HR 71 | Temp 95.8°F | Resp 18

## 2019-08-20 VITALS — BP 136/76 | HR 72 | Temp 97.7°F | Resp 18 | Wt 263.2 lb

## 2019-08-20 DIAGNOSIS — C9110 Chronic lymphocytic leukemia of B-cell type not having achieved remission: Secondary | ICD-10-CM

## 2019-08-20 DIAGNOSIS — Z5112 Encounter for antineoplastic immunotherapy: Secondary | ICD-10-CM | POA: Diagnosis not present

## 2019-08-20 LAB — CBC WITH DIFFERENTIAL/PLATELET
Abs Immature Granulocytes: 0.02 10*3/uL (ref 0.00–0.07)
Basophils Absolute: 0.1 10*3/uL (ref 0.0–0.1)
Basophils Relative: 0 %
Eosinophils Absolute: 0.2 10*3/uL (ref 0.0–0.5)
Eosinophils Relative: 1 %
HCT: 44.6 % (ref 36.0–46.0)
Hemoglobin: 13.5 g/dL (ref 12.0–15.0)
Immature Granulocytes: 0 %
Lymphocytes Relative: 78 %
Lymphs Abs: 10.3 10*3/uL — ABNORMAL HIGH (ref 0.7–4.0)
MCH: 29.9 pg (ref 26.0–34.0)
MCHC: 30.3 g/dL (ref 30.0–36.0)
MCV: 98.7 fL (ref 80.0–100.0)
Monocytes Absolute: 0.3 10*3/uL (ref 0.1–1.0)
Monocytes Relative: 2 %
Neutro Abs: 2.6 10*3/uL (ref 1.7–7.7)
Neutrophils Relative %: 19 %
Platelets: 110 10*3/uL — ABNORMAL LOW (ref 150–400)
RBC: 4.52 MIL/uL (ref 3.87–5.11)
RDW: 14.6 % (ref 11.5–15.5)
Smear Review: NORMAL
WBC: 13.5 10*3/uL — ABNORMAL HIGH (ref 4.0–10.5)
nRBC: 0.1 % (ref 0.0–0.2)

## 2019-08-20 MED ORDER — ACETAMINOPHEN 325 MG PO TABS
650.0000 mg | ORAL_TABLET | Freq: Once | ORAL | Status: AC
  Administered 2019-08-20: 650 mg via ORAL
  Filled 2019-08-20: qty 2

## 2019-08-20 MED ORDER — SODIUM CHLORIDE 0.9 % IV SOLN
375.0000 mg/m2 | Freq: Once | INTRAVENOUS | Status: AC
  Administered 2019-08-20: 900 mg via INTRAVENOUS
  Filled 2019-08-20: qty 50

## 2019-08-20 MED ORDER — FAMOTIDINE IN NACL 20-0.9 MG/50ML-% IV SOLN
20.0000 mg | Freq: Once | INTRAVENOUS | Status: AC
  Administered 2019-08-20: 20 mg via INTRAVENOUS
  Filled 2019-08-20: qty 50

## 2019-08-20 MED ORDER — LORAZEPAM 2 MG/ML IJ SOLN
0.5000 mg | Freq: Once | INTRAMUSCULAR | Status: AC | PRN
  Administered 2019-08-20: 0.5 mg via INTRAVENOUS
  Filled 2019-08-20: qty 1

## 2019-08-20 MED ORDER — SODIUM CHLORIDE 0.9 % IV SOLN
10.0000 mg | Freq: Once | INTRAVENOUS | Status: AC
  Administered 2019-08-20: 10 mg via INTRAVENOUS
  Filled 2019-08-20: qty 10

## 2019-08-20 MED ORDER — DIPHENHYDRAMINE HCL 50 MG/ML IJ SOLN
25.0000 mg | Freq: Once | INTRAMUSCULAR | Status: AC
  Administered 2019-08-20: 25 mg via INTRAVENOUS
  Filled 2019-08-20: qty 1

## 2019-08-20 MED ORDER — SODIUM CHLORIDE 0.9 % IV SOLN
Freq: Once | INTRAVENOUS | Status: AC
  Filled 2019-08-20: qty 250

## 2019-08-20 NOTE — Progress Notes (Signed)
Pt tolerated infusion well. No s/s of distress noted. Pt and VS stable at discharge.  

## 2019-08-20 NOTE — Progress Notes (Signed)
Pt in for follow up today, denies any concerns.  

## 2019-09-07 ENCOUNTER — Other Ambulatory Visit: Payer: Self-pay | Admitting: Cardiology

## 2019-09-07 ENCOUNTER — Other Ambulatory Visit: Payer: Self-pay | Admitting: Gastroenterology

## 2019-09-07 MED ORDER — APIXABAN 5 MG PO TABS: 5.0000 mg | ORAL_TABLET | Freq: Two times a day (BID) | ORAL | 2 refills | Status: DC

## 2019-09-07 NOTE — Telephone Encounter (Signed)
Patient called stating that she needs a written prescription for apixaban (ELIQUIS) 5 MG TABS tablet. States that she gets her medication from [place].

## 2019-09-07 NOTE — Telephone Encounter (Signed)
Message left on vm that written rx for eliquis is available for pick up.

## 2019-09-29 ENCOUNTER — Telehealth: Payer: Self-pay | Admitting: Cardiology

## 2019-09-29 MED ORDER — APIXABAN 5 MG PO TABS: 5.0000 mg | ORAL_TABLET | Freq: Two times a day (BID) | ORAL | 0 refills | Status: DC

## 2019-09-29 NOTE — Telephone Encounter (Signed)
[place]   She has not received her last order and wants to know if we have any samples we can provide to her until her order comes in.

## 2019-09-29 NOTE — Telephone Encounter (Signed)
Detailed voice message left that samples are ready for pick up.  Informed her to call office when she arrives to parking lot & someone can bring out to her.

## 2019-10-02 ENCOUNTER — Other Ambulatory Visit: Payer: Self-pay

## 2019-10-02 ENCOUNTER — Inpatient Hospital Stay: Payer: Medicare Other | Attending: Oncology

## 2019-10-02 ENCOUNTER — Ambulatory Visit: Payer: Medicare Other | Admitting: Oncology

## 2019-10-02 DIAGNOSIS — C9110 Chronic lymphocytic leukemia of B-cell type not having achieved remission: Secondary | ICD-10-CM | POA: Insufficient documentation

## 2019-10-02 LAB — CBC WITH DIFFERENTIAL/PLATELET
Abs Immature Granulocytes: 0.01 10*3/uL (ref 0.00–0.07)
Basophils Absolute: 0 10*3/uL (ref 0.0–0.1)
Basophils Relative: 1 %
Eosinophils Absolute: 0.1 10*3/uL (ref 0.0–0.5)
Eosinophils Relative: 2 %
HCT: 42.7 % (ref 36.0–46.0)
Hemoglobin: 13.3 g/dL (ref 12.0–15.0)
Immature Granulocytes: 0 %
Lymphocytes Relative: 62 %
Lymphs Abs: 5 10*3/uL — ABNORMAL HIGH (ref 0.7–4.0)
MCH: 29.8 pg (ref 26.0–34.0)
MCHC: 31.1 g/dL (ref 30.0–36.0)
MCV: 95.7 fL (ref 80.0–100.0)
Monocytes Absolute: 0.4 10*3/uL (ref 0.1–1.0)
Monocytes Relative: 6 %
Neutro Abs: 2.2 10*3/uL (ref 1.7–7.7)
Neutrophils Relative %: 29 %
Platelets: 120 10*3/uL — ABNORMAL LOW (ref 150–400)
RBC: 4.46 MIL/uL (ref 3.87–5.11)
RDW: 14.1 % (ref 11.5–15.5)
Smear Review: DECREASED
WBC: 7.8 10*3/uL (ref 4.0–10.5)
nRBC: 0 % (ref 0.0–0.2)

## 2019-11-09 ENCOUNTER — Other Ambulatory Visit: Payer: Self-pay | Admitting: Cardiology

## 2019-11-11 ENCOUNTER — Other Ambulatory Visit: Payer: Self-pay

## 2019-11-11 ENCOUNTER — Ambulatory Visit (INDEPENDENT_AMBULATORY_CARE_PROVIDER_SITE_OTHER): Payer: Medicare Other | Admitting: Gastroenterology

## 2019-11-11 ENCOUNTER — Encounter: Payer: Self-pay | Admitting: Gastroenterology

## 2019-11-11 VITALS — BP 128/69 | HR 73 | Temp 97.1°F | Ht 67.0 in | Wt 259.2 lb

## 2019-11-11 DIAGNOSIS — J018 Other acute sinusitis: Secondary | ICD-10-CM | POA: Diagnosis not present

## 2019-11-11 DIAGNOSIS — J329 Chronic sinusitis, unspecified: Secondary | ICD-10-CM | POA: Insufficient documentation

## 2019-11-11 DIAGNOSIS — K219 Gastro-esophageal reflux disease without esophagitis: Secondary | ICD-10-CM | POA: Diagnosis not present

## 2019-11-11 MED ORDER — HYDROCORTISONE (PERIANAL) 2.5 % EX CREA: 1.0000 "application " | TOPICAL_CREAM | Freq: Two times a day (BID) | CUTANEOUS | 1 refills | Status: DC

## 2019-11-11 MED ORDER — AMOXICILLIN-POT CLAVULANATE 875-125 MG PO TABS: 1.0000 | ORAL_TABLET | Freq: Two times a day (BID) | ORAL | 0 refills | Status: AC

## 2019-11-11 NOTE — Patient Instructions (Signed)
I sent in Augmentin to take twice a day with food for 5 days.   We will see you in 1 year or sooner if needed!  Please call if any concerns in the meantime!  I enjoyed seeing you again today! As you know, I value our relationship and want to provide genuine, compassionate, and quality care. I welcome your feedback. If you receive a survey regarding your visit,  I greatly appreciate you taking time to fill this out. See you next time!  Annitta Needs, PhD, ANP-BC [facility]

## 2019-11-11 NOTE — Progress Notes (Signed)
Referring Provider: Renee Rival, NP Primary Care Physician:  Renee Rival, NP  Primary GI: Dr. Gala Romney   Chief Complaint  Patient presents with  . Diverticulitis    f/u    HPI:   Daisy Black is a 69 y.o. female presenting today with a history of CLL, followed by [facility]. Colonoscopy and EGD on file. Colonoscopy surveillance due in 2024. History of chronic GERD. When last seen, she was empirically started on treatment for presumed diverticulitis.   Returns today in routine follow-up. Abdominal pain resolved and responded well to antibiotics.  Has cut down to once daily PPI. (Protonix). Completed cycle of Rituxan. CT upcoming Monday. Sinus headache and pressure. OTC agents for a week without any improvement.   No dysphagia. No abdominal pain, N/V. No overt GI bleeding requesting Anusol to have on file.   Past Medical History:  Diagnosis Date  . Anemia   . Arthritis   . Asthma   . CLL (chronic lymphocytic leukemia) ([place])   . Essential hypertension   . GERD (gastroesophageal reflux disease)   . History of hiatal hernia   . Hyperlipidemia   . Paroxysmal atrial fibrillation ([facility])   . Splenomegaly     Past Surgical History:  Procedure Laterality Date  . ANKLE SURGERY Right    repair fracture  . CARDIOVERSION N/A 03/20/2016   Procedure: CARDIOVERSION;  Surgeon: Satira Sark, MD;  Location: [facility];  Service: Cardiovascular;  Laterality: N/A;  . CARDIOVERSION N/A 05/22/2016   Procedure: CARDIOVERSION;  Surgeon: Satira Sark, MD;  Location: [facility];  Service: Cardiovascular;  Laterality: N/A;  . CHOLECYSTECTOMY    . COLONOSCOPY WITH PROPOFOL N/A 03/27/2018   Internal hemorrhoids, diverticulosis in sigmoid and descending colon, three 4-6 mm polyps at IC valve. tubular adenomas 5 year surveillance  . ESOPHAGOGASTRODUODENOSCOPY (EGD) WITH PROPOFOL N/A 03/27/2018   Moderate Schatzki's ring s/p dilation, small hiatal hernia  . HERNIA  REPAIR  2011   Incisional and umbilical utilizing mesh  . MALONEY DILATION N/A 03/27/2018   Procedure: Venia Minks DILATION;  Surgeon: Daneil Dolin, MD;  Location: [facility];  Service: Endoscopy;  Laterality: N/A;  . POLYPECTOMY  03/27/2018   Procedure: POLYPECTOMY;  Surgeon: Daneil Dolin, MD;  Location: [facility];  Service: Endoscopy;;  colon  . ROTATOR CUFF REPAIR Left   . TEE WITHOUT CARDIOVERSION N/A 01/17/2016   Procedure: TRANSESOPHAGEAL ECHOCARDIOGRAM (TEE);  Surgeon: Herminio Commons, MD;  Location: [facility];  Service: Cardiovascular;  Laterality: N/A;  . TEE WITHOUT CARDIOVERSION N/A 02/21/2016   Procedure: TRANSESOPHAGEAL ECHOCARDIOGRAM (TEE) WITH PROPOFOL;  Surgeon: Herminio Commons, MD;  Location: [facility];  Service: Cardiovascular;  Laterality: N/A;  . TEE WITHOUT CARDIOVERSION N/A 03/20/2016   Procedure: TRANSESOPHAGEAL ECHOCARDIOGRAM (TEE) WITH PROPOFOL;  Surgeon: Satira Sark, MD;  Location: [facility];  Service: Cardiovascular;  Laterality: N/A;  . TONSILLECTOMY      Current Outpatient Medications  Medication Sig Dispense Refill  . acetaminophen (TYLENOL) 650 MG CR tablet Take 650-1,300 mg by mouth daily as needed for pain.     Marland Kitchen apixaban (ELIQUIS) 5 MG TABS tablet Take 1 tablet (5 mg total) by mouth 2 (two) times daily. 28 tablet 0  . cetirizine (ZYRTEC) 10 MG tablet Take 10 mg by mouth daily as needed for allergies.    . Ferrous Gluconate-C-Folic Acid (IRON-C PO) Take 1 tablet by mouth daily.    . flecainide (TAMBOCOR) 50  MG tablet TAKE 1.5 TABLETS BY MOUTH TWO TIMES A DAY 270 tablet 3  . fluticasone (FLONASE) 50 MCG/ACT nasal spray Place 1 spray into both nostrils daily as needed for allergies or rhinitis.    . furosemide (LASIX) 20 MG tablet Take 2 tablets (40 mg total) by mouth daily. 60 tablet 6  . HYDROcodone-acetaminophen (NORCO/VICODIN) 5-325 MG tablet Take 1 tablet by mouth every 6 (six) hours as needed for moderate pain. 60 tablet 0  . levothyroxine  (SYNTHROID, LEVOTHROID) 125 MCG tablet Take 125 mcg by mouth daily before breakfast.    . Melatonin 5 MG SUBL Place 1 tablet under the tongue as needed (for sleep).    . metoprolol tartrate (LOPRESSOR) 25 MG tablet TAKE ONE TABLET BY MOUTH TWO TIMES A DAY 180 tablet 0  . Multiple Vitamin (MULTIVITAMIN WITH MINERALS) TABS tablet Take 1 tablet by mouth daily.    . pantoprazole (PROTONIX) 40 MG tablet TAKE ONE TABLET BY MOUTH TWO TIMES A DAY BEFORE A MEAL (Patient taking differently: Take 40 mg by mouth daily. ) 60 tablet 5  . prochlorperazine (COMPAZINE) 10 MG tablet Take 1 tablet (10 mg total) by mouth every 6 (six) hours as needed for nausea or vomiting. 60 tablet 2  . promethazine (PHENERGAN) 25 MG tablet Take 1 tablet (25 mg total) by mouth every 6 (six) hours as needed for nausea or vomiting. 60 tablet 1   No current facility-administered medications for this visit.   Facility-Administered Medications Ordered in Other Visits  Medication Dose Route Frequency Provider Last Rate Last Admin  . hydrocortisone cream 1 % 1 application  1 application Topical TID PRN Satira Sark, MD        Allergies as of 11/11/2019 - Review Complete 11/11/2019  Allergen Reaction Noted  . Flagyl [metronidazole]  05/01/2019    Family History  Problem Relation Age of Onset  . Ovarian cancer Mother 5       Secondary to ovarian cancer  . Leukemia Mother   . Stroke Father 18       Brain stem infarction  . Breast cancer Paternal Aunt   . Crohn's disease Son   . Colon cancer Neg Hx     Social History   Socioeconomic History  . Marital status: Married    Spouse name: Not on file  . Number of children: 53  . Years of education: Not on file  . Highest education level: Not on file  Occupational History  . Not on file  Tobacco Use  . Smoking status: Former Smoker    Packs/day: 0.25    Years: 2.00    Pack years: 0.50    Types: Cigarettes    Quit date: 08/03/1975    Years since quitting: 44.3  .  Smokeless tobacco: Never Used  Substance and Sexual Activity  . Alcohol use: No    Alcohol/week: 0.0 standard drinks  . Drug use: No  . Sexual activity: Yes    Birth control/protection: Post-menopausal  Other Topics Concern  . Not on file  Social History Narrative  . Not on file   Social Determinants of Health   Financial Resource Strain:   . Difficulty of Paying Living Expenses:   Food Insecurity:   . Worried About Charity fundraiser in the Last Year:   . Arboriculturist in the Last Year:   Transportation Needs:   . Film/video editor (Medical):   Marland Kitchen Lack of Transportation (Non-Medical):   Physical Activity:   .  Days of Exercise per Week:   . Minutes of Exercise per Session:   Stress:   . Feeling of Stress :   Social Connections:   . Frequency of Communication with Friends and Family:   . Frequency of Social Gatherings with Friends and Family:   . Attends Religious Services:   . Active Member of Clubs or Organizations:   . Attends Archivist Meetings:   Marland Kitchen Marital Status:     Review of Systems: Gen: Denies fever, chills, anorexia. Denies fatigue, weakness, weight loss.  CV: Denies chest pain, palpitations, syncope, peripheral edema, and claudication. Resp: Denies dyspnea at rest, cough, wheezing, coughing up blood, and pleurisy. GI: see HPI Derm: Denies rash, itching, dry skin Psych: Denies depression, anxiety, memory loss, confusion. No homicidal or suicidal ideation.  Heme: Denies bruising, bleeding, and enlarged lymph nodes.  Physical Exam: BP 128/69   Pulse 73   Temp (!) 97.1 F (36.2 C) (Oral)   Ht 5\' 7"  (1.702 m)   Wt 259 lb 3.2 oz (117.6 kg)   BMI 40.60 kg/m  General:   Alert and oriented. No distress noted. Pleasant and cooperative.  Head:  Normocephalic and atraumatic. Eyes:  Conjuctiva clear without scleral icterus. Mouth:  Mask in place Abdomen:  +BS, soft, non-tender and non-distended. No rebound or guarding. No HSM or masses  noted. Msk:  Symmetrical without gross deformities. Normal posture. Extremities:  Without edema. Neurologic:  Alert and  oriented x4 Psych:  Alert and cooperative. Normal mood and affect.  ASSESSMENT: Daisy Black is a 69 y.o. female presenting today in follow-up for GERD, possible bout with diverticulitis and treated empirically in Oct 2020, doing well from a GI standpoint.  After course of empiric antibiotics, abdominal pain resolved. Continues to follow closely with Oncology due to history of CLL. GERD is well-managed with now once daily PPI, titrated down from BID. Mild sinus infection that has not responded to OTC agents in 7 days, requesting antibiotics. As it has been a week, will provide short course with recommendations to follow-up with PCP if no improvement.    PLAN:   Anusol provided to have on hand  Continue Protonix daily  Brief course of Augmentin X 5 days for sinus infection  Colonoscopy in 2024  Return in 1 year  Annitta Needs, PhD, [facility]

## 2019-11-13 NOTE — Progress Notes (Signed)
[place]  Telephone:([phone]) [phone] [phone]) [phone]  ID: Daisy Black OB: 1951-05-22  MR#: [id number]  [id number]  Patient Care Team: Renee Rival, NP as PCP - General (Nurse Practitioner) Satira Sark, MD as PCP - Cardiology (Cardiology) Aviva Signs, MD (General Surgery) Rico Junker, RN as Registered Nurse Theodore Demark, RN as Registered Nurse Satira Sark, MD as Consulting Physician (Cardiology) Gala Romney Cristopher Estimable, MD as Consulting Physician (Gastroenterology) Lloyd Huger, MD as Consulting Physician (Hematology and Oncology)  CHIEF COMPLAINT: CLL  INTERVAL HISTORY: Patient returns to clinic today for repeat laboratory work, further evaluation, discussion of her imaging results.  She continues to have increased weakness and fatigue, but this has improved since completing her most recent round of Rituxan.  She continues to have left flank pain, but this is improved as well.   She has no neurologic complaints.  She denies any fevers, chills, or night sweats.  She has no chest pain, shortness of breath, cough, or hemoptysis. She denies any nausea, vomiting, constipation, or diarrhea.  She has no urinary complaints.  Patient offers no further specific complaints today.  REVIEW OF SYSTEMS:   Review of Systems  Constitutional: Positive for malaise/fatigue. Negative for diaphoresis, fever and weight loss.  Respiratory: Negative.  Negative for cough and shortness of breath.   Cardiovascular: Negative.  Negative for chest pain and leg swelling.  Gastrointestinal: Negative.  Negative for abdominal pain, blood in stool, constipation, diarrhea, heartburn, melena, nausea and vomiting.  Genitourinary: Positive for flank pain.  Musculoskeletal: Negative for back pain.  Skin: Negative.  Negative for rash.  Neurological: Positive for weakness. Negative for dizziness, sensory change, focal weakness and headaches.  Psychiatric/Behavioral:  Negative.  The patient is not nervous/anxious.     As per HPI. Otherwise, a complete review of systems is negative.  PAST MEDICAL HISTORY: Past Medical History:  Diagnosis Date  . Anemia   . Arthritis   . Asthma   . CLL (chronic lymphocytic leukemia) ([place])   . Essential hypertension   . GERD (gastroesophageal reflux disease)   . History of hiatal hernia   . Hyperlipidemia   . Paroxysmal atrial fibrillation ([facility])   . Splenomegaly     PAST SURGICAL HISTORY: Past Surgical History:  Procedure Laterality Date  . ANKLE SURGERY Right    repair fracture  . CARDIOVERSION N/A 03/20/2016   Procedure: CARDIOVERSION;  Surgeon: Satira Sark, MD;  Location: [facility];  Service: Cardiovascular;  Laterality: N/A;  . CARDIOVERSION N/A 05/22/2016   Procedure: CARDIOVERSION;  Surgeon: Satira Sark, MD;  Location: [facility];  Service: Cardiovascular;  Laterality: N/A;  . CHOLECYSTECTOMY    . COLONOSCOPY WITH PROPOFOL N/A 03/27/2018   Internal hemorrhoids, diverticulosis in sigmoid and descending colon, three 4-6 mm polyps at IC valve. tubular adenomas 5 year surveillance  . ESOPHAGOGASTRODUODENOSCOPY (EGD) WITH PROPOFOL N/A 03/27/2018   Moderate Schatzki's ring s/p dilation, small hiatal hernia  . HERNIA REPAIR  2011   Incisional and umbilical utilizing mesh  . MALONEY DILATION N/A 03/27/2018   Procedure: Venia Minks DILATION;  Surgeon: Daneil Dolin, MD;  Location: [facility];  Service: Endoscopy;  Laterality: N/A;  . POLYPECTOMY  03/27/2018   Procedure: POLYPECTOMY;  Surgeon: Daneil Dolin, MD;  Location: [facility];  Service: Endoscopy;;  colon  . ROTATOR CUFF REPAIR Left   . TEE WITHOUT CARDIOVERSION N/A 01/17/2016   Procedure: TRANSESOPHAGEAL ECHOCARDIOGRAM (TEE);  Surgeon: Herminio Commons, MD;  Location:  [facility];  Service: Cardiovascular;  Laterality: N/A;  . TEE WITHOUT CARDIOVERSION N/A 02/21/2016   Procedure: TRANSESOPHAGEAL ECHOCARDIOGRAM (TEE) WITH PROPOFOL;  Surgeon: Herminio Commons, MD;  Location: [facility];  Service: Cardiovascular;  Laterality: N/A;  . TEE WITHOUT CARDIOVERSION N/A 03/20/2016   Procedure: TRANSESOPHAGEAL ECHOCARDIOGRAM (TEE) WITH PROPOFOL;  Surgeon: Satira Sark, MD;  Location: [facility];  Service: Cardiovascular;  Laterality: N/A;  . TONSILLECTOMY      FAMILY HISTORY Family History  Problem Relation Age of Onset  . Ovarian cancer Mother 77       Secondary to ovarian cancer  . Leukemia Mother   . Stroke Father 8       Brain stem infarction  . Breast cancer Paternal Aunt   . Crohn's disease Son   . Colon cancer Neg Hx        ADVANCED DIRECTIVES:    HEALTH MAINTENANCE: Social History   Tobacco Use  . Smoking status: Former Smoker    Packs/day: 0.25    Years: 2.00    Pack years: 0.50    Types: Cigarettes    Quit date: 08/03/1975    Years since quitting: 44.3  . Smokeless tobacco: Never Used  Substance Use Topics  . Alcohol use: No    Alcohol/week: 0.0 standard drinks  . Drug use: No     Colonoscopy:  PAP:  Bone density:  Lipid panel:  Allergies  Allergen Reactions  . Flagyl [Metronidazole]     Classic side effect of severe nausea, unable to tolerate.     Current Outpatient Medications  Medication Sig Dispense Refill  . acetaminophen (TYLENOL) 650 MG CR tablet Take 650-1,300 mg by mouth daily as needed for pain.     Marland Kitchen apixaban (ELIQUIS) 5 MG TABS tablet Take 1 tablet (5 mg total) by mouth 2 (two) times daily. 28 tablet 0  . cetirizine (ZYRTEC) 10 MG tablet Take 10 mg by mouth daily as needed for allergies.    . Ferrous Gluconate-C-Folic Acid (IRON-C PO) Take 1 tablet by mouth daily.    . flecainide (TAMBOCOR) 50 MG tablet TAKE 1.5 TABLETS BY MOUTH TWO TIMES A DAY 270 tablet 3  . fluticasone (FLONASE) 50 MCG/ACT nasal spray Place 1 spray into both nostrils daily as needed for allergies or rhinitis.    . furosemide (LASIX) 20 MG tablet Take 2 tablets (40 mg total) by mouth daily. 60 tablet 6  .  HYDROcodone-acetaminophen (NORCO/VICODIN) 5-325 MG tablet Take 1 tablet by mouth every 6 (six) hours as needed for moderate pain. 60 tablet 0  . hydrocortisone (ANUSOL-HC) 2.5 % rectal cream Place 1 application rectally 2 (two) times daily. 30 g 1  . levothyroxine (SYNTHROID, LEVOTHROID) 125 MCG tablet Take 125 mcg by mouth daily before breakfast.    . Melatonin 5 MG SUBL Place 1 tablet under the tongue as needed (for sleep).    . metoprolol tartrate (LOPRESSOR) 25 MG tablet TAKE ONE TABLET BY MOUTH TWO TIMES A DAY 180 tablet 0  . Multiple Vitamin (MULTIVITAMIN WITH MINERALS) TABS tablet Take 1 tablet by mouth daily.    . pantoprazole (PROTONIX) 40 MG tablet TAKE ONE TABLET BY MOUTH TWO TIMES A DAY BEFORE A MEAL (Patient taking differently: Take 40 mg by mouth daily. ) 60 tablet 5  . prochlorperazine (COMPAZINE) 10 MG tablet Take 1 tablet (10 mg total) by mouth every 6 (six) hours as needed for nausea or vomiting. 60 tablet 2  . promethazine (PHENERGAN) 25 MG tablet  Take 1 tablet (25 mg total) by mouth every 6 (six) hours as needed for nausea or vomiting. 60 tablet 1   No current facility-administered medications for this visit.   Facility-Administered Medications Ordered in Other Visits  Medication Dose Route Frequency Provider Last Rate Last Admin  . hydrocortisone cream 1 % 1 application  1 application Topical TID PRN Satira Sark, MD        OBJECTIVE: Vitals:   11/19/19 1048  BP: 120/76  Pulse: 75  Resp: 20  Temp: (!) 95.3 F (35.2 C)  SpO2: 100%     Body mass index is 40.99 kg/m.    ECOG FS:1 - Symptomatic but completely ambulatory  General: Well-developed, well-nourished, no acute distress. Eyes: Pink conjunctiva, anicteric sclera. HEENT: Normocephalic, moist mucous membranes. Lungs: No audible wheezing or coughing. Heart: Regular rate and rhythm. Abdomen: Soft, nontender, no obvious distention. Musculoskeletal: No edema, cyanosis, or clubbing. Neuro: Alert, answering  all questions appropriately. Cranial nerves grossly intact. Skin: No rashes or petechiae noted. Psych: Normal affect.   LAB RESULTS:  Lab Results  Component Value Date   NA 143 11/16/2019   K 4.2 11/16/2019   CL 107 11/16/2019   CO2 28 11/16/2019   GLUCOSE 80 11/16/2019   BUN 17 11/16/2019   CREATININE 0.71 11/16/2019   CALCIUM 9.9 11/16/2019   PROT 6.5 11/21/2017   ALBUMIN 4.2 11/21/2017   AST 20 11/21/2017   ALT 23 11/21/2017   ALKPHOS 92 11/21/2017   BILITOT 1.0 11/21/2017   GFRNONAA >60 11/16/2019   GFRAA >60 11/16/2019    Lab Results  Component Value Date   WBC 7.8 10/02/2019   NEUTROABS 2.2 10/02/2019   HGB 13.3 10/02/2019   HCT 42.7 10/02/2019   MCV 95.7 10/02/2019   PLT 120 (L) 10/02/2019     STUDIES: CT SOFT TISSUE NECK W CONTRAST  Result Date: 11/16/2019 CLINICAL DATA:  Restaging of CLL.  Pain when turning to the left. EXAM: CT NECK WITH CONTRAST TECHNIQUE: Multidetector CT imaging of the neck was performed using the standard protocol following the bolus administration of intravenous contrast. CONTRAST:  12mL OMNIPAQUE IOHEXOL 300 MG/ML  SOLN COMPARISON:  None. FINDINGS: Pharynx and larynx: No focal mucosal or submucosal lesions are present. Nasopharynx is clear. Soft palate and tongue base are within normal limits. Salivary glands: The submandibular and parotid glands are normal bilaterally. Thyroid: Stable nodule in the isthmus of the thyroid measures 1.5 x 1.8 x 2.3 cm. Lymph nodes: Bilateral cervical lymph nodes have decreased in size since the prior exam. The right level 2A index node now measures 16 x 11 mm on the sagittal images. This compares with 19 x 13 mm. No new or enlarging nodes are present. Vascular: No significant vascular disease is present. Limited intracranial: Within normal limits. Visualized orbits: The globes and orbits are within normal limits. Mastoids and visualized paranasal sinuses: The paranasal sinuses and mastoid air cells are clear.  Skeleton: Chronic endplate degenerative changes are present at C5-6 and C6-7. Slight anterolisthesis is present at C3-4 and C4-5. Upper chest: Lung apices are clear. Thoracic inlet is within normal limits. IMPRESSION: 1. Interval decrease in size of bilateral cervical lymph nodes consistent with positive response to therapy. No evidence for recurrent or progressive CLL. 2. Degenerative changes of the cervical spine. 3. Stable thyroid nodule measuring 1.5 x 1.8 x 2.3 cm. This has been documented on prior studies (ref: J Am Coll Radiol. 2015 Feb;12(2): 143-50).No follow-up recommended. Electronically Signed   By:  San Morelle M.D.   On: 11/16/2019 15:11   CT Chest W Contrast  Result Date: 11/16/2019 CLINICAL DATA:  Restaging CLL EXAM: CT CHEST, ABDOMEN, AND PELVIS WITH CONTRAST TECHNIQUE: Multidetector CT imaging of the chest, abdomen and pelvis was performed following the standard protocol during bolus administration of intravenous contrast. CONTRAST:  164mL OMNIPAQUE IOHEXOL 300 MG/ML  SOLN COMPARISON:  07/13/2019 FINDINGS: CT CHEST FINDINGS Cardiovascular: The heart is normal in size. No pericardial effusion. The aorta is normal in caliber. No dissection. No atherosclerotic calcifications. No definite coronary artery calcifications. Stable mild enlargement of the pulmonary arteries may suggest pulmonary hypertension. Mediastinum/Nodes: Interval decrease in size of supraclavicular and axillary lymph nodes. 12 mm right lower axillary lymph node on image number 25/4 previously measured 19.5 mm. 9 mm left axillary node on image 16/4 previously measured 18 mm. 9 mm right hilar node on image 27/4 previously measured 15 mm. Lungs/Pleura: No acute pulmonary findings or worrisome pulmonary lesions. Musculoskeletal: No breast masses. No significant bony findings. CT ABDOMEN PELVIS FINDINGS Hepatobiliary: No focal hepatic lesions or intrahepatic biliary dilatation. The gallbladder is surgically absent. No common  bile duct dilatation. Pancreas: No mass, inflammation or ductal dilatation. Spleen: Slight interval decrease in size of the spleen. Maximum AP diameter is 14 mm and was previously 15.5 mm. No focal splenic lesions. Adrenals/Urinary Tract: The adrenal glands and kidneys are unremarkable and stable. The bladder is normal. Stomach/Bowel: The stomach, duodenum, small bowel and colon are grossly normal. No acute inflammatory changes, mass lesions or obstructive findings. Vascular/Lymphatic: The aorta and branch vessels are unremarkable and stable. The major venous structures are patent. Improved upper abdominal lymphadenopathy. 13.5 mm periportal lymph node on image 59/for previously measured 20 mm. Celiac axis lymph node on image 55/4 measures 7 mm and previously measured 12 mm. 12 mm inter aortocaval lymph node on image 70/for previously measured 14 mm. Right obturator lymph node on image number 107/4 measures 9.5 mm and previously measured 12 mm. Left-sided node measures 9.5 mm and previously measured 14.5 mm. Slight interval decrease in size of bilateral inguinal lymph nodes. Reproductive: The uterus and ovaries are unremarkable and stable. Other: No pelvic mass or adenopathy. No free pelvic fluid collections. No inguinal mass or adenopathy. No abdominal wall hernia or subcutaneous lesions. Musculoskeletal: No significant bony findings. IMPRESSION: 1. Interval decrease in size of the supraclavicular, axillary, mediastinal/hilar, upper abdominal and pelvic lymph nodes. 2. No new or progressive findings. 3. Stable mild enlargement of the pulmonary arteries may suggest pulmonary hypertension. Electronically Signed   By: Marijo Sanes M.D.   On: 11/16/2019 13:16   CT Abdomen Pelvis W Contrast  Result Date: 11/16/2019 CLINICAL DATA:  Restaging CLL EXAM: CT CHEST, ABDOMEN, AND PELVIS WITH CONTRAST TECHNIQUE: Multidetector CT imaging of the chest, abdomen and pelvis was performed following the standard protocol during  bolus administration of intravenous contrast. CONTRAST:  127mL OMNIPAQUE IOHEXOL 300 MG/ML  SOLN COMPARISON:  07/13/2019 FINDINGS: CT CHEST FINDINGS Cardiovascular: The heart is normal in size. No pericardial effusion. The aorta is normal in caliber. No dissection. No atherosclerotic calcifications. No definite coronary artery calcifications. Stable mild enlargement of the pulmonary arteries may suggest pulmonary hypertension. Mediastinum/Nodes: Interval decrease in size of supraclavicular and axillary lymph nodes. 12 mm right lower axillary lymph node on image number 25/4 previously measured 19.5 mm. 9 mm left axillary node on image 16/4 previously measured 18 mm. 9 mm right hilar node on image 27/4 previously measured 15 mm. Lungs/Pleura: No acute pulmonary  findings or worrisome pulmonary lesions. Musculoskeletal: No breast masses. No significant bony findings. CT ABDOMEN PELVIS FINDINGS Hepatobiliary: No focal hepatic lesions or intrahepatic biliary dilatation. The gallbladder is surgically absent. No common bile duct dilatation. Pancreas: No mass, inflammation or ductal dilatation. Spleen: Slight interval decrease in size of the spleen. Maximum AP diameter is 14 mm and was previously 15.5 mm. No focal splenic lesions. Adrenals/Urinary Tract: The adrenal glands and kidneys are unremarkable and stable. The bladder is normal. Stomach/Bowel: The stomach, duodenum, small bowel and colon are grossly normal. No acute inflammatory changes, mass lesions or obstructive findings. Vascular/Lymphatic: The aorta and branch vessels are unremarkable and stable. The major venous structures are patent. Improved upper abdominal lymphadenopathy. 13.5 mm periportal lymph node on image 59/for previously measured 20 mm. Celiac axis lymph node on image 55/4 measures 7 mm and previously measured 12 mm. 12 mm inter aortocaval lymph node on image 70/for previously measured 14 mm. Right obturator lymph node on image number 107/4 measures  9.5 mm and previously measured 12 mm. Left-sided node measures 9.5 mm and previously measured 14.5 mm. Slight interval decrease in size of bilateral inguinal lymph nodes. Reproductive: The uterus and ovaries are unremarkable and stable. Other: No pelvic mass or adenopathy. No free pelvic fluid collections. No inguinal mass or adenopathy. No abdominal wall hernia or subcutaneous lesions. Musculoskeletal: No significant bony findings. IMPRESSION: 1. Interval decrease in size of the supraclavicular, axillary, mediastinal/hilar, upper abdominal and pelvic lymph nodes. 2. No new or progressive findings. 3. Stable mild enlargement of the pulmonary arteries may suggest pulmonary hypertension. Electronically Signed   By: Marijo Sanes M.D.   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  PLAN:    1. CLL: Patient completed her second round of weekly single agent Rituxan x4 on August 20, 2019.  CT scan results from November 16, 2019 reviewed independently and report as above with improvement in widespread lymphadenopathy as well as mild improvement of splenomegaly.  Her most recent white blood cell count is within normal limits.  No intervention is needed at this time.  If patient had recurrence of disease, would consider Imbruvica.  Return to clinic in 3 months with repeat imaging and further evaluation.   2.  Early satiety, indigestion: Improved.  Likely secondary to splenomegaly.  3.  Atrial fibrillation: Continue monitoring and treatment per cardiology. 4. Iron deficiency anemia: Resolved.  She last received IV Feraheme in May 2017.  5. PET positive thyroid lesions: 1.2 cm right lobe nodule also seen on thyroid ultrasound April 06, 2016.  No change on recent CT scan.  Continue follow-up with ENT as indicated. 6.  Pain: Patient does not complain of this today. 7.  Nausea: Patient does not complain of this today.  Continue Phenergan as needed. 8.   Thrombocytopenia: Chronic and unchanged.  Patient's most recent platelet count is 120.  Patient expressed understanding and was in agreement with this plan. She also understands that She can call clinic at any time with any questions, concerns, or complaints.     Lloyd Huger, MD   11/20/2019 6:52 AM

## 2019-11-16 ENCOUNTER — Other Ambulatory Visit: Payer: Self-pay

## 2019-11-16 ENCOUNTER — Ambulatory Visit
Admission: RE | Admit: 2019-11-16 | Discharge: 2019-11-16 | Disposition: A | Discharge status: Home / Self Care | Payer: Medicare Other | Source: Ambulatory Visit | Attending: Oncology | Admitting: Oncology

## 2019-11-16 ENCOUNTER — Inpatient Hospital Stay: Payer: Medicare Other | Attending: Oncology

## 2019-11-16 DIAGNOSIS — I480 Paroxysmal atrial fibrillation: Secondary | ICD-10-CM | POA: Insufficient documentation

## 2019-11-16 DIAGNOSIS — C9110 Chronic lymphocytic leukemia of B-cell type not having achieved remission: Secondary | ICD-10-CM | POA: Insufficient documentation

## 2019-11-16 DIAGNOSIS — R5383 Other fatigue: Secondary | ICD-10-CM | POA: Diagnosis not present

## 2019-11-16 DIAGNOSIS — Z7901 Long term (current) use of anticoagulants: Secondary | ICD-10-CM | POA: Diagnosis not present

## 2019-11-16 DIAGNOSIS — Z79899 Other long term (current) drug therapy: Secondary | ICD-10-CM | POA: Insufficient documentation

## 2019-11-16 DIAGNOSIS — I10 Essential (primary) hypertension: Secondary | ICD-10-CM | POA: Insufficient documentation

## 2019-11-16 DIAGNOSIS — R531 Weakness: Secondary | ICD-10-CM | POA: Diagnosis not present

## 2019-11-16 LAB — BASIC METABOLIC PANEL
Anion gap: 8 (ref 5–15)
BUN: 17 mg/dL (ref 8–23)
CO2: 28 mmol/L (ref 22–32)
Calcium: 9.9 mg/dL (ref 8.9–10.3)
Chloride: 107 mmol/L (ref 98–111)
Creatinine, Ser: 0.71 mg/dL (ref 0.44–1.00)
GFR calc Af Amer: >60 mL/min (ref >60)
GFR calc non Af Amer: >60 mL/min (ref >60)
Glucose, Bld: 80 mg/dL (ref 70–99)
Potassium: 4.2 mmol/L (ref 3.5–5.1)
Sodium: 143 mmol/L (ref 135–145)

## 2019-11-16 MED ORDER — IOHEXOL 300 MG/ML  SOLN
100.0000 mL | Freq: Once | INTRAMUSCULAR | Status: AC | PRN
  Administered 2019-11-16: 10:00:00 100 mL via INTRAVENOUS

## 2019-11-19 ENCOUNTER — Other Ambulatory Visit: Payer: Self-pay

## 2019-11-19 ENCOUNTER — Encounter: Payer: Self-pay | Admitting: Oncology

## 2019-11-19 ENCOUNTER — Inpatient Hospital Stay (HOSPITAL_BASED_OUTPATIENT_CLINIC_OR_DEPARTMENT_OTHER): Payer: Medicare Other | Admitting: Oncology

## 2019-11-19 VITALS — BP 120/76 | HR 75 | Temp 95.3°F | Resp 20 | Wt 261.7 lb

## 2019-11-19 DIAGNOSIS — C9110 Chronic lymphocytic leukemia of B-cell type not having achieved remission: Secondary | ICD-10-CM | POA: Diagnosis not present

## 2019-11-19 NOTE — Progress Notes (Signed)
Patient today for follow up on CT. Patient reports some pain that comes and goes in spleen. Rates pain at 2 - 3. Patient states she is having some trouble sleeping. Would also like to discuss some neck pain she is having that is causing some headaches. States just started a week ago after her neck popped while turning to look at someone.   Patient also request refill for her pain medication as well as phenergan.

## 2019-11-20 MED ORDER — HYDROCODONE-ACETAMINOPHEN 5-325 MG PO TABS: 1.0000 | ORAL_TABLET | Freq: Four times a day (QID) | ORAL | 0 refills | Status: DC | PRN

## 2019-11-20 MED ORDER — PROMETHAZINE HCL 25 MG PO TABS: 25.0000 mg | ORAL_TABLET | Freq: Four times a day (QID) | ORAL | 1 refills | Status: DC | PRN

## 2019-11-23 ENCOUNTER — Telehealth: Payer: Self-pay | Admitting: Cardiology

## 2019-11-23 NOTE — Telephone Encounter (Signed)
[place] get handwritten RX to mail and also requesting samples.

## 2019-11-25 MED ORDER — APIXABAN 5 MG PO TABS: 5.0000 mg | ORAL_TABLET | Freq: Two times a day (BID) | ORAL | 2 refills | Status: DC

## 2019-11-25 NOTE — Telephone Encounter (Signed)
Advised that prescription is available for pick up.

## 2020-02-04 ENCOUNTER — Other Ambulatory Visit: Payer: Self-pay | Admitting: Cardiology

## 2020-02-19 ENCOUNTER — Ambulatory Visit: Admission: RE | Admit: 2020-02-19 | Payer: Medicare Other | Source: Ambulatory Visit

## 2020-02-19 ENCOUNTER — Other Ambulatory Visit: Payer: Medicare Other

## 2020-02-23 ENCOUNTER — Ambulatory Visit: Payer: Medicare Other

## 2020-02-25 ENCOUNTER — Inpatient Hospital Stay: Payer: Medicare Other | Admitting: Oncology

## 2020-02-25 ENCOUNTER — Inpatient Hospital Stay: Payer: Medicare Other

## 2020-03-04 ENCOUNTER — Other Ambulatory Visit: Payer: Medicare Other

## 2020-03-04 ENCOUNTER — Ambulatory Visit
Admission: RE | Admit: 2020-03-04 | Discharge: 2020-03-04 | Disposition: A | Discharge status: Home / Self Care | Payer: Medicare Other | Source: Ambulatory Visit | Attending: Oncology | Admitting: Oncology

## 2020-03-04 ENCOUNTER — Other Ambulatory Visit: Payer: Self-pay

## 2020-03-04 DIAGNOSIS — C9110 Chronic lymphocytic leukemia of B-cell type not having achieved remission: Secondary | ICD-10-CM | POA: Diagnosis present

## 2020-03-04 LAB — POCT I-STAT CREATININE: Creatinine, Ser: 0.9 mg/dL (ref 0.44–1.00)

## 2020-03-04 MED ORDER — IOHEXOL 300 MG/ML  SOLN
100.0000 mL | Freq: Once | INTRAMUSCULAR | Status: AC | PRN
  Administered 2020-03-04: 100 mL via INTRAVENOUS

## 2020-03-04 NOTE — Progress Notes (Signed)
[place]  [phone]) [phone] [phone]) [phone]  ID: Daisy Black OB: 02/01/1951  MR#: [id number]  SAY#:[id number]  Patient Care Team: Renee Rival, NP as PCP - General (Nurse Practitioner) Satira Sark, MD as PCP - Cardiology (Cardiology) Aviva Signs, MD (General Surgery) Rico Junker, RN as Registered Nurse Theodore Demark, RN as Registered Nurse Satira Sark, MD as Consulting Physician (Cardiology) Gala Romney Cristopher Estimable, MD as Consulting Physician (Gastroenterology) Lloyd Huger, MD as Consulting Physician (Hematology and Oncology)  CHIEF COMPLAINT: CLL  INTERVAL HISTORY: Patient returns to clinic today for repeat laboratory work, discussion of her imaging results, and further evaluation.  She continues to have chronic weakness and fatigue, but states this is mildly improved.  She also has intermittent nausea and flank pain.  She otherwise feels well.  She has no neurologic complaints.  She denies any fevers, chills, or night sweats.  She has no chest pain, shortness of breath, cough, or hemoptysis. She denies any nausea, vomiting, constipation, or diarrhea.  She has no urinary complaints.  Patient offers no further specific complaints today.  REVIEW OF SYSTEMS:   Review of Systems  Constitutional: Positive for malaise/fatigue. Negative for diaphoresis, fever and weight loss.  Respiratory: Negative.  Negative for cough and shortness of breath.   Cardiovascular: Negative.  Negative for chest pain and leg swelling.  Gastrointestinal: Positive for nausea. Negative for abdominal pain, blood in stool, constipation, diarrhea, heartburn, melena and vomiting.  Genitourinary: Positive for flank pain.  Musculoskeletal: Negative for back pain.  Skin: Negative.  Negative for rash.  Neurological: Positive for weakness. Negative for dizziness, sensory change, focal weakness and headaches.  Psychiatric/Behavioral: Negative.  The patient is not  nervous/anxious.     As per HPI. Otherwise, a complete review of systems is negative.  PAST MEDICAL HISTORY: Past Medical History:  Diagnosis Date  . Anemia   . Arthritis   . Asthma   . CLL (chronic lymphocytic leukemia) ([place])   . Essential hypertension   . GERD (gastroesophageal reflux disease)   . History of hiatal hernia   . Hyperlipidemia   . Paroxysmal atrial fibrillation ([facility])   . Splenomegaly     PAST SURGICAL HISTORY: Past Surgical History:  Procedure Laterality Date  . ANKLE SURGERY Right    repair fracture  . CARDIOVERSION N/A 03/20/2016   Procedure: CARDIOVERSION;  Surgeon: Satira Sark, MD;  Location: [facility];  Service: Cardiovascular;  Laterality: N/A;  . CARDIOVERSION N/A 05/22/2016   Procedure: CARDIOVERSION;  Surgeon: Satira Sark, MD;  Location: [facility];  Service: Cardiovascular;  Laterality: N/A;  . CHOLECYSTECTOMY    . COLONOSCOPY WITH PROPOFOL N/A 03/27/2018   Internal hemorrhoids, diverticulosis in sigmoid and descending colon, three 4-6 mm polyps at IC valve. tubular adenomas 5 year surveillance  . ESOPHAGOGASTRODUODENOSCOPY (EGD) WITH PROPOFOL N/A 03/27/2018   Moderate Schatzki's ring s/p dilation, small hiatal hernia  . HERNIA REPAIR  2011   Incisional and umbilical utilizing mesh  . MALONEY DILATION N/A 03/27/2018   Procedure: Venia Minks DILATION;  Surgeon: Daneil Dolin, MD;  Location: [facility];  Service: Endoscopy;  Laterality: N/A;  . POLYPECTOMY  03/27/2018   Procedure: POLYPECTOMY;  Surgeon: Daneil Dolin, MD;  Location: [facility];  Service: Endoscopy;;  colon  . ROTATOR CUFF REPAIR Left   . TEE WITHOUT CARDIOVERSION N/A 01/17/2016   Procedure: TRANSESOPHAGEAL ECHOCARDIOGRAM (TEE);  Surgeon: Herminio Commons, MD;  Location: [facility];  Service: Cardiovascular;  Laterality: N/A;  . TEE WITHOUT CARDIOVERSION N/A 02/21/2016   Procedure: TRANSESOPHAGEAL ECHOCARDIOGRAM (TEE) WITH PROPOFOL;  Surgeon: Herminio Commons, MD;  Location: [facility];  Service: Cardiovascular;  Laterality: N/A;  . TEE WITHOUT CARDIOVERSION N/A 03/20/2016   Procedure: TRANSESOPHAGEAL ECHOCARDIOGRAM (TEE) WITH PROPOFOL;  Surgeon: Satira Sark, MD;  Location: [facility];  Service: Cardiovascular;  Laterality: N/A;  . TONSILLECTOMY      FAMILY HISTORY Family History  Problem Relation Age of Onset  . Ovarian cancer Mother 56       Secondary to ovarian cancer  . Leukemia Mother   . Stroke Father 61       Brain stem infarction  . Breast cancer Paternal Aunt   . Crohn's disease Son   . Colon cancer Neg Hx        ADVANCED DIRECTIVES:    HEALTH MAINTENANCE: Social History   Tobacco Use  . Smoking status: Former Smoker    Packs/day: 0.25    Years: 2.00    Pack years: 0.50    Types: Cigarettes    Quit date: 08/03/1975    Years since quitting: 44.6  . Smokeless tobacco: Never Used  Substance Use Topics  . Alcohol use: No    Alcohol/week: 0.0 standard drinks  . Drug use: No     Colonoscopy:  PAP:  Bone density:  Lipid panel:  Allergies  Allergen Reactions  . Flagyl [Metronidazole]     Classic side effect of severe nausea, unable to tolerate.     Current Outpatient Medications  Medication Sig Dispense Refill  . acetaminophen (TYLENOL) 650 MG CR tablet Take 650-1,300 mg by mouth daily as needed for pain.     Marland Kitchen apixaban (ELIQUIS) 5 MG TABS tablet Take 1 tablet (5 mg total) by mouth 2 (two) times daily. 180 tablet 2  . cetirizine (ZYRTEC) 10 MG tablet Take 10 mg by mouth daily as needed for allergies.    . Ferrous Gluconate-C-Folic Acid (IRON-C PO) Take 1 tablet by mouth daily.    . flecainide (TAMBOCOR) 50 MG tablet TAKE 1.5 TABLETS BY MOUTH TWO TIMES A DAY 270 tablet 3  . fluticasone (FLONASE) 50 MCG/ACT nasal spray Place 1 spray into both nostrils daily as needed for allergies or rhinitis.    . furosemide (LASIX) 20 MG tablet TAKE 2 TABLETS (40 MG TOTAL) BY MOUTH DAILY. 60 tablet 6  . HYDROcodone-acetaminophen (NORCO/VICODIN) 5-325  MG tablet Take 1 tablet by mouth every 6 (six) hours as needed for moderate pain. 60 tablet 0  . hydrocortisone (ANUSOL-HC) 2.5 % rectal cream Place 1 application rectally 2 (two) times daily. 30 g 1  . levothyroxine (SYNTHROID, LEVOTHROID) 125 MCG tablet Take 125 mcg by mouth daily before breakfast.    . Melatonin 5 MG SUBL Place 1 tablet under the tongue as needed (for sleep).    . metoprolol tartrate (LOPRESSOR) 25 MG tablet TAKE ONE TABLET BY MOUTH TWO TIMES A DAY 180 tablet 1  . Multiple Vitamin (MULTIVITAMIN WITH MINERALS) TABS tablet Take 1 tablet by mouth daily.    . pantoprazole (PROTONIX) 40 MG tablet TAKE ONE TABLET BY MOUTH TWO TIMES A DAY BEFORE A MEAL (Patient taking differently: Take 40 mg by mouth daily. ) 60 tablet 5  . prochlorperazine (COMPAZINE) 10 MG tablet Take 1 tablet (10 mg total) by mouth every 6 (six) hours as needed for nausea or vomiting. 60 tablet 2  . promethazine (PHENERGAN) 25 MG tablet Take 1 tablet (25 mg total)  by mouth every 6 (six) hours as needed for nausea or vomiting. 60 tablet 1   No current facility-administered medications for this visit.   Facility-Administered Medications Ordered in Other Visits  Medication Dose Route Frequency Provider Last Rate Last Admin  . hydrocortisone cream 1 % 1 application  1 application Topical TID PRN Satira Sark, MD        OBJECTIVE: Vitals:   03/09/20 0957  BP: (!) 141/82  Pulse: (!) 53  Resp: 18  Temp: (!) 97.2 F (36.2 C)  SpO2: 100%     Body mass index is 41.07 kg/m.    ECOG FS:1 - Symptomatic but completely ambulatory  General: Well-developed, well-nourished, no acute distress. Eyes: Pink conjunctiva, anicteric sclera. HEENT: Normocephalic, moist mucous membranes. Lungs: No audible wheezing or coughing. Heart: Regular rate and rhythm. Abdomen: Soft, nontender, no obvious distention. Musculoskeletal: No edema, cyanosis, or clubbing. Neuro: Alert, answering all questions appropriately. Cranial  nerves grossly intact. Skin: No rashes or petechiae noted. Psych: Normal affect. Lymphatics: No palpable lymphadenopathy.   LAB RESULTS:  Lab Results  Component Value Date   NA 144 03/09/2020   K 4.6 03/09/2020   CL 107 03/09/2020   CO2 29 03/09/2020   GLUCOSE 105 (H) 03/09/2020   BUN 17 03/09/2020   CREATININE 0.84 03/09/2020   CALCIUM 9.8 03/09/2020   PROT 6.5 03/09/2020   ALBUMIN 4.1 03/09/2020   AST 18 03/09/2020   ALT 18 03/09/2020   ALKPHOS 108 03/09/2020   BILITOT 0.7 03/09/2020   GFRNONAA >60 03/09/2020   GFRAA >60 03/09/2020    Lab Results  Component Value Date   WBC 13.1 (H) 03/09/2020   NEUTROABS 3.3 03/09/2020   HGB 14.2 03/09/2020   HCT 43.1 03/09/2020   MCV 93.7 03/09/2020   PLT 129 (L) 03/09/2020     STUDIES: CT SOFT TISSUE NECK W CONTRAST  Result Date: 03/04/2020 CLINICAL DATA:  CLL EXAM: CT NECK WITH CONTRAST TECHNIQUE: Multidetector CT imaging of the neck was performed using the standard protocol following the bolus administration of intravenous contrast. CONTRAST:  186mL OMNIPAQUE IOHEXOL 300 MG/ML  SOLN COMPARISON:  11/16/2019 FINDINGS: Pharynx and larynx: Unremarkable.  No mass or swelling. Salivary glands: Unremarkable. Thyroid: Stable thyroid nodule as reported on multiple prior studies. Lymph nodes: There has been a small increase in size of several lymph nodes. For example, right level 2 node on series 3, image 42 measures 2.1 x 1.5 cm (previously 1.8 x 1.2 cm). A right level 3 node on image 58 measures 9 mm (previously 7 mm). A left level 3 node on image 56 measures 1.3 cm (previously 1 cm). Vascular: Major neck vessels are patent. Limited intracranial: No abnormal enhancement Visualized orbits: Unremarkable. Mastoids and visualized paranasal sinuses: Aerated. Skeleton: Degenerative changes of the cervical spine. Upper chest: Refer to dedicated CT chest. Other: None. IMPRESSION: Small increase in size of multiple lymph nodes since 11/16/2019.  Electronically Signed   By: Macy Mis M.D.   On: 03/04/2020 10:28   CT Chest W Contrast  Result Date: 03/04/2020 CLINICAL DATA:  CLL follow-up. EXAM: CT CHEST, ABDOMEN, AND PELVIS WITH CONTRAST TECHNIQUE: Multidetector CT imaging of the chest, abdomen and pelvis was performed following the standard protocol during bolus administration of intravenous contrast. CONTRAST:  174mL OMNIPAQUE IOHEXOL 300 MG/ML  SOLN COMPARISON:  11/16/2019 FINDINGS: CT CHEST FINDINGS Cardiovascular: The heart is normal in size. No pericardial effusion. The aorta is normal in caliber. No dissection. No atherosclerotic calcifications. The branch vessels  are patent. No definite coronary artery calcifications. Mediastinum/Nodes: Small scattered bilateral supraclavicular lymph nodes appears stable. Bilateral axillary and subpectoral lymphadenopathy. Right axillary lymph node on image 17 measures 11 mm and previously measured 9 mm. Lower right axillary lymph node on image 22/4 measures 18 mm and previously measured 13 mm. Left axillary lymph node on image 16/for measures 12 mm and previously measured 9 mm. Stable right hilar node measuring 11.5 mm on image 24/4. Stable 9.5 mm subcarinal lymph node on image 26/4. No new or progressive mediastinal or hilar lymph nodes. The esophagus is grossly normal. Multinodular thyroid goiter is again demonstrated. The largest nodule is in the isthmus and measures 16 mm. Recommend thyroid US (ref: J Am Coll Radiol. 2015 Feb;12(2): 143-50). Lungs/Pleura: No significant pulmonary findings. No worrisome pulmonary lesions or nodules. No acute pulmonary findings. No pleural effusion. Musculoskeletal: No breast masses. The bony structures are intact. No worrisome bone lesions. CT ABDOMEN PELVIS FINDINGS Hepatobiliary: No hepatic lesions are identified. No intrahepatic biliary dilatation. The gallbladder is surgically absent. No common bile duct dilatation. Pancreas: No mass, inflammation or ductal dilatation.  Spleen: Mild stable splenomegaly. The spleen measures 15 x 14 x 9.5 cm. No splenic lesions. Adrenals/Urinary Tract: The adrenal glands and kidneys are unremarkable. No renal lesions or hydronephrosis. The bladder is unremarkable. Stomach/Bowel: The stomach, duodenum, small bowel and colon are unremarkable. No acute inflammatory changes, mass lesions or obstructive findings. The terminal ileum and appendix are normal. Vascular/Lymphatic: Stable gastrohepatic ligament, celiac axis and periportal lymph nodes. Stable 15 mm hepatoduodenal ligament lymph node on image number 55/4. Stable 11 mm node between the pancreatic head and the IVC on image 55/4. Stable 10.5 mm gastrohepatic ligament lymph node on image 50/4. Small scattered retroperitoneal lymph nodes are stable. Stable 15 mm right femoral lymph node on image 108/4. Stable bilateral obturator lymph nodes. The right measures 12.5 mm and the left measures 11 mm. Stable bilateral inguinal lymph nodes. 12.5 mm right inguinal node on image 125/4 is unchanged. Reproductive: The uterus and ovaries are normal. Other: No pelvic mass or adenopathy. No free pelvic fluid collections. No inguinal mass or adenopathy. No abdominal wall hernia or subcutaneous lesions. Musculoskeletal: No significant bony findings. IMPRESSION: 1. Overall stable lymphadenopathy involving the chest, abdomen and pelvis. Very slight interval increase in some of the axillary lymph nodes but the mediastinal, hilar, abdominal, pelvic and inguinal nodes are stable. 2. Stable mild splenomegaly. 3. Multinodular thyroid goiter. Recommend thyroid US (ref: J Am Coll Radiol. Electronically Signed   By: Marijo Sanes M.D.   On: 03/04/2020 13:46   CT Abdomen Pelvis W Contrast  Result Date: 03/04/2020 CLINICAL DATA:  CLL follow-up. EXAM: CT CHEST, ABDOMEN, AND PELVIS WITH CONTRAST TECHNIQUE: Multidetector CT imaging of the chest, abdomen and pelvis was performed following the standard protocol during bolus  administration of intravenous contrast. CONTRAST:  135mL OMNIPAQUE IOHEXOL 300 MG/ML  SOLN COMPARISON:  11/16/2019 FINDINGS: CT CHEST FINDINGS Cardiovascular: The heart is normal in size. No pericardial effusion. The aorta is normal in caliber. No dissection. No atherosclerotic calcifications. The branch vessels are patent. No definite coronary artery calcifications. Mediastinum/Nodes: Small scattered bilateral supraclavicular lymph nodes appears stable. Bilateral axillary and subpectoral lymphadenopathy. Right axillary lymph node on image 17 measures 11 mm and previously measured 9 mm. Lower right axillary lymph node on image 22/4 measures 18 mm and previously measured 13 mm. Left axillary lymph node on image 16/for measures 12 mm and previously measured 9 mm. Stable right hilar node  measuring 11.5 mm on image 24/4. Stable 9.5 mm subcarinal lymph node on image 26/4. No new or progressive mediastinal or hilar lymph nodes. The esophagus is grossly normal. Multinodular thyroid goiter is again demonstrated. The largest nodule is in the isthmus and measures 16 mm. Recommend thyroid US (ref: J Am Coll Radiol. 2015 Feb;12(2): 143-50). Lungs/Pleura: No significant pulmonary findings. No worrisome pulmonary lesions or nodules. No acute pulmonary findings. No pleural effusion. Musculoskeletal: No breast masses. The bony structures are intact. No worrisome bone lesions. CT ABDOMEN PELVIS FINDINGS Hepatobiliary: No hepatic lesions are identified. No intrahepatic biliary dilatation. The gallbladder is surgically absent. No common bile duct dilatation. Pancreas: No mass, inflammation or ductal dilatation. Spleen: Mild stable splenomegaly. The spleen measures 15 x 14 x 9.5 cm. No splenic lesions. Adrenals/Urinary Tract: The adrenal glands and kidneys are unremarkable. No renal lesions or hydronephrosis. The bladder is unremarkable. Stomach/Bowel: The stomach, duodenum, small bowel and colon are unremarkable. No acute inflammatory  changes, mass lesions or obstructive findings. The terminal ileum and appendix are normal. Vascular/Lymphatic: Stable gastrohepatic ligament, celiac axis and periportal lymph nodes. Stable 15 mm hepatoduodenal ligament lymph node on image number 55/4. Stable 11 mm node between the pancreatic head and the IVC on image 55/4. Stable 10.5 mm gastrohepatic ligament lymph node on image 50/4. Small scattered retroperitoneal lymph nodes are stable. Stable 15 mm right femoral lymph node on image 108/4. Stable bilateral obturator lymph nodes. The right measures 12.5 mm and the left measures 11 mm. Stable bilateral inguinal lymph nodes. 12.5 mm right inguinal node on image 125/4 is unchanged. Reproductive: The uterus and ovaries are normal. Other: No pelvic mass or adenopathy. No free pelvic fluid collections. No inguinal mass or adenopathy. No abdominal wall hernia or subcutaneous lesions. Musculoskeletal: No significant bony findings. IMPRESSION: 1. Overall stable lymphadenopathy involving the chest, abdomen and pelvis. Very slight interval increase in some of the axillary lymph nodes but the mediastinal, hilar, abdominal, pelvic and inguinal nodes are stable. 2. Stable mild splenomegaly. 3. Multinodular thyroid goiter. Recommend thyroid US (ref: J Am Coll Radiol. Electronically Signed   By: Marijo Sanes M.D.   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  PLAN:    1. CLL: Patient completed her second round of weekly single agent Rituxan x4 on August 20, 2019.  CT scan results from March 04, 2020 reviewed independently and reported as above with essentially stable lymphadenopathy and splenomegaly.  Her white blood cell count has trended up slightly to 13.1.  No intervention is needed at this time.  Repeat imaging in February 2022.  If patient had recurrence of disease, would consider repeating Rituxan or switch to Imbruvica.  Return to clinic in 3 months for  laboratory work and further evaluation  2.  Early satiety, indigestion: Improved.  Likely secondary to splenomegaly.  3.  Atrial fibrillation: Continue monitoring and treatment per cardiology. 4.  Iron deficiency anemia: Resolved.  She last received IV Feraheme in May 2017.  5.  PET positive thyroid lesions: 1.2 cm right lobe nodule also seen on thyroid ultrasound April 06, 2016.  No change on recent CT scan.  Continue follow-up with ENT as indicated. 6.  Pain: Intermittent.  Patient was given a refill of oxycodone today. 7.  Nausea: Intermittent.  Patient was given a refill of Phenergan today. 8.  Thrombocytopenia: Chronic and unchanged.  Patient's most recent platelet count was 129.  Patient expressed understanding and was  in agreement with this plan. She also understands that She can call clinic at any time with any questions, concerns, or complaints.     Lloyd Huger, MD   03/09/2020 10:20 AM

## 2020-03-08 ENCOUNTER — Other Ambulatory Visit: Payer: Self-pay | Admitting: Cardiology

## 2020-03-09 ENCOUNTER — Inpatient Hospital Stay (HOSPITAL_BASED_OUTPATIENT_CLINIC_OR_DEPARTMENT_OTHER): Payer: Medicare Other | Admitting: Oncology

## 2020-03-09 ENCOUNTER — Other Ambulatory Visit: Payer: Self-pay

## 2020-03-09 ENCOUNTER — Inpatient Hospital Stay: Payer: Medicare Other | Attending: Oncology

## 2020-03-09 ENCOUNTER — Encounter: Payer: Self-pay | Admitting: Oncology

## 2020-03-09 VITALS — BP 141/82 | HR 53 | Temp 97.2°F | Resp 18 | Wt 262.2 lb

## 2020-03-09 DIAGNOSIS — I10 Essential (primary) hypertension: Secondary | ICD-10-CM | POA: Insufficient documentation

## 2020-03-09 DIAGNOSIS — Z87891 Personal history of nicotine dependence: Secondary | ICD-10-CM | POA: Diagnosis not present

## 2020-03-09 DIAGNOSIS — R110 Nausea: Secondary | ICD-10-CM | POA: Diagnosis not present

## 2020-03-09 DIAGNOSIS — D696 Thrombocytopenia, unspecified: Secondary | ICD-10-CM | POA: Diagnosis not present

## 2020-03-09 DIAGNOSIS — I480 Paroxysmal atrial fibrillation: Secondary | ICD-10-CM | POA: Diagnosis not present

## 2020-03-09 DIAGNOSIS — D649 Anemia, unspecified: Secondary | ICD-10-CM | POA: Diagnosis not present

## 2020-03-09 DIAGNOSIS — R5383 Other fatigue: Secondary | ICD-10-CM | POA: Insufficient documentation

## 2020-03-09 DIAGNOSIS — R531 Weakness: Secondary | ICD-10-CM | POA: Insufficient documentation

## 2020-03-09 DIAGNOSIS — C9110 Chronic lymphocytic leukemia of B-cell type not having achieved remission: Secondary | ICD-10-CM

## 2020-03-09 LAB — CBC WITH DIFFERENTIAL/PLATELET
Abs Immature Granulocytes: 0.02 10*3/uL (ref 0.00–0.07)
Basophils Absolute: 0.1 10*3/uL (ref 0.0–0.1)
Basophils Relative: 0 %
Eosinophils Absolute: 0.2 10*3/uL (ref 0.0–0.5)
Eosinophils Relative: 1 %
HCT: 43.1 % (ref 36.0–46.0)
Hemoglobin: 14.2 g/dL (ref 12.0–15.0)
Immature Granulocytes: 0 %
Lymphocytes Relative: 67 %
Lymphs Abs: 8.6 10*3/uL — ABNORMAL HIGH (ref 0.7–4.0)
MCH: 30.9 pg (ref 26.0–34.0)
MCHC: 32.9 g/dL (ref 30.0–36.0)
MCV: 93.7 fL (ref 80.0–100.0)
Monocytes Absolute: 0.9 10*3/uL (ref 0.1–1.0)
Monocytes Relative: 7 %
Neutro Abs: 3.3 10*3/uL (ref 1.7–7.7)
Neutrophils Relative %: 25 %
Platelets: 129 10*3/uL — ABNORMAL LOW (ref 150–400)
RBC: 4.6 MIL/uL (ref 3.87–5.11)
RDW: 13.7 % (ref 11.5–15.5)
Smear Review: NORMAL
WBC: 13.1 10*3/uL — ABNORMAL HIGH (ref 4.0–10.5)
nRBC: 0 % (ref 0.0–0.2)

## 2020-03-09 LAB — IRON AND TIBC
Iron: 66 ug/dL (ref 28–170)
Saturation Ratios: 21 % (ref 10.4–31.8)
TIBC: 312 ug/dL (ref 250–450)
UIBC: 246 ug/dL

## 2020-03-09 LAB — COMPREHENSIVE METABOLIC PANEL WITH GFR
ALT: 18 U/L (ref 0–44)
AST: 18 U/L (ref 15–41)
Albumin: 4.1 g/dL (ref 3.5–5.0)
Alkaline Phosphatase: 108 U/L (ref 38–126)
Anion gap: 8 (ref 5–15)
BUN: 17 mg/dL (ref 8–23)
CO2: 29 mmol/L (ref 22–32)
Calcium: 9.8 mg/dL (ref 8.9–10.3)
Chloride: 107 mmol/L (ref 98–111)
Creatinine, Ser: 0.84 mg/dL (ref 0.44–1.00)
GFR calc Af Amer: >60 mL/min
GFR calc non Af Amer: >60 mL/min
Glucose, Bld: 105 mg/dL — ABNORMAL HIGH (ref 70–99)
Potassium: 4.6 mmol/L (ref 3.5–5.1)
Sodium: 144 mmol/L (ref 135–145)
Total Bilirubin: 0.7 mg/dL (ref 0.3–1.2)
Total Protein: 6.5 g/dL (ref 6.5–8.1)

## 2020-03-09 LAB — FERRITIN: Ferritin: 31 ng/mL (ref 11–307)

## 2020-03-09 MED ORDER — PROMETHAZINE HCL 25 MG PO TABS: 25.0000 mg | ORAL_TABLET | Freq: Four times a day (QID) | ORAL | 1 refills | Status: DC | PRN

## 2020-03-09 MED ORDER — HYDROCODONE-ACETAMINOPHEN 5-325 MG PO TABS: 1.0000 | ORAL_TABLET | Freq: Four times a day (QID) | ORAL | 0 refills | Status: DC | PRN

## 2020-03-09 NOTE — Progress Notes (Signed)
Patient here for oncology follow-up appointment, expresses complaints of fatigue and ocassional stomach pain, requesting refill on pain/nausea med.

## 2020-03-25 ENCOUNTER — Ambulatory Visit: Payer: Medicare Other | Admitting: Cardiology

## 2020-03-29 ENCOUNTER — Ambulatory Visit (INDEPENDENT_AMBULATORY_CARE_PROVIDER_SITE_OTHER): Payer: Medicare Other | Admitting: Cardiology

## 2020-03-29 ENCOUNTER — Encounter: Payer: Self-pay | Admitting: Cardiology

## 2020-03-29 VITALS — BP 134/84 | HR 69 | Ht 67.0 in | Wt 266.0 lb

## 2020-03-29 DIAGNOSIS — I480 Paroxysmal atrial fibrillation: Secondary | ICD-10-CM | POA: Diagnosis not present

## 2020-03-29 DIAGNOSIS — Z01810 Encounter for preprocedural cardiovascular examination: Secondary | ICD-10-CM

## 2020-03-29 MED ORDER — APIXABAN 5 MG PO TABS: 5.0000 mg | ORAL_TABLET | Freq: Two times a day (BID) | ORAL | 0 refills | Status: DC

## 2020-03-29 NOTE — Progress Notes (Signed)
Cardiology Office Note  Date: 03/29/2020   ID: Sharryn, Belding 1950/09/30, MRN [id number]  PCP:  Renee Rival, NP  Cardiologist:  Rozann Lesches, MD Electrophysiologist:  None   Chief Complaint  Patient presents with  . Cardiac follow-up    History of Present Illness: Daisy Black is a 69 y.o. female last seen in November 2020.  She presents for a follow-up visit.  From a cardiac perspective, she reports only occasional palpitations, no progressive symptoms or chest pain with activity.  She has been somewhat frustrated that she has not been able to lose any weight.  Also having progressive arthritic left knee pain with plan for total knee replacement soon.  She has follow-up with her orthopedist in the next few weeks.  Follow-up echocardiogram from November of last year revealed LVEF 55 to 60% with mild diastolic dysfunction, normal RV contraction, and no major valvular abnormalities.  I reviewed her recent lab work as outlined below.  She does not report any active bleeding problems on Eliquis.  I personally reviewed her ECG today which shows sinus rhythm with frequent PACs.  Past Medical History:  Diagnosis Date  . Anemia   . Arthritis   . Asthma   . CLL (chronic lymphocytic leukemia) ([place])   . Essential hypertension   . GERD (gastroesophageal reflux disease)   . History of hiatal hernia   . Hyperlipidemia   . Paroxysmal atrial fibrillation (HCC)   . Splenomegaly     Past Surgical History:  Procedure Laterality Date  . ANKLE SURGERY Right    repair fracture  . CARDIOVERSION N/A 03/20/2016   Procedure: CARDIOVERSION;  Surgeon: Satira Sark, MD;  Location: [facility];  Service: Cardiovascular;  Laterality: N/A;  . CARDIOVERSION N/A 05/22/2016   Procedure: CARDIOVERSION;  Surgeon: Satira Sark, MD;  Location: [facility];  Service: Cardiovascular;  Laterality: N/A;  . CHOLECYSTECTOMY    . COLONOSCOPY WITH PROPOFOL N/A 03/27/2018   Internal  hemorrhoids, diverticulosis in sigmoid and descending colon, three 4-6 mm polyps at IC valve. tubular adenomas 5 year surveillance  . ESOPHAGOGASTRODUODENOSCOPY (EGD) WITH PROPOFOL N/A 03/27/2018   Moderate Schatzki's ring s/p dilation, small hiatal hernia  . HERNIA REPAIR  2011   Incisional and umbilical utilizing mesh  . MALONEY DILATION N/A 03/27/2018   Procedure: Venia Minks DILATION;  Surgeon: Daneil Dolin, MD;  Location: [facility];  Service: Endoscopy;  Laterality: N/A;  . POLYPECTOMY  03/27/2018   Procedure: POLYPECTOMY;  Surgeon: Daneil Dolin, MD;  Location: [facility];  Service: Endoscopy;;  colon  . ROTATOR CUFF REPAIR Left   . TEE WITHOUT CARDIOVERSION N/A 01/17/2016   Procedure: TRANSESOPHAGEAL ECHOCARDIOGRAM (TEE);  Surgeon: Herminio Commons, MD;  Location: [facility];  Service: Cardiovascular;  Laterality: N/A;  . TEE WITHOUT CARDIOVERSION N/A 02/21/2016   Procedure: TRANSESOPHAGEAL ECHOCARDIOGRAM (TEE) WITH PROPOFOL;  Surgeon: Herminio Commons, MD;  Location: [facility];  Service: Cardiovascular;  Laterality: N/A;  . TEE WITHOUT CARDIOVERSION N/A 03/20/2016   Procedure: TRANSESOPHAGEAL ECHOCARDIOGRAM (TEE) WITH PROPOFOL;  Surgeon: Satira Sark, MD;  Location: [facility];  Service: Cardiovascular;  Laterality: N/A;  . TONSILLECTOMY      Current Outpatient Medications  Medication Sig Dispense Refill  . acetaminophen (TYLENOL) 650 MG CR tablet Take 650-1,300 mg by mouth daily as needed for pain.     Marland Kitchen apixaban (ELIQUIS) 5 MG TABS tablet Take 1 tablet (5 mg total) by mouth 2 (two) times daily. [place]  tablet 0  . cetirizine (ZYRTEC) 10 MG tablet Take 10 mg by mouth daily as needed for allergies.    . Ferrous Gluconate-C-Folic Acid (IRON-C PO) Take 1 tablet by mouth daily.    . flecainide (TAMBOCOR) 50 MG tablet TAKE 1.5 TABLETS BY MOUTH TWO TIMES A DAY 270 tablet 3  . fluticasone (FLONASE) 50 MCG/ACT nasal spray Place 1 spray into both nostrils daily as needed for allergies or  rhinitis.    . furosemide (LASIX) 20 MG tablet TAKE 2 TABLETS (40 MG TOTAL) BY MOUTH DAILY. 60 tablet 6  . HYDROcodone-acetaminophen (NORCO/VICODIN) 5-325 MG tablet Take 1 tablet by mouth every 6 (six) hours as needed for moderate pain. 60 tablet 0  . hydrocortisone (ANUSOL-HC) 2.5 % rectal cream Place 1 application rectally 2 (two) times daily. 30 g 1  . levothyroxine (SYNTHROID, LEVOTHROID) 125 MCG tablet Take 125 mcg by mouth daily before breakfast.    . Melatonin 5 MG SUBL Place 1 tablet under the tongue as needed (for sleep).    . metoprolol tartrate (LOPRESSOR) 25 MG tablet TAKE ONE TABLET BY MOUTH TWO TIMES A DAY 180 tablet 1  . Multiple Vitamin (MULTIVITAMIN WITH MINERALS) TABS tablet Take 1 tablet by mouth daily.    . pantoprazole (PROTONIX) 40 MG tablet TAKE ONE TABLET BY MOUTH TWO TIMES A DAY BEFORE A MEAL (Patient taking differently: Take 40 mg by mouth daily. ) 60 tablet 5  . prochlorperazine (COMPAZINE) 10 MG tablet Take 1 tablet (10 mg total) by mouth every 6 (six) hours as needed for nausea or vomiting. 60 tablet 2  . promethazine (PHENERGAN) 25 MG tablet Take 1 tablet (25 mg total) by mouth every 6 (six) hours as needed for nausea or vomiting. 60 tablet 1   No current facility-administered medications for this visit.   Facility-Administered Medications Ordered in Other Visits  Medication Dose Route Frequency Provider Last Rate Last Admin  . hydrocortisone cream 1 % 1 application  1 application Topical TID PRN Satira Sark, MD       Allergies:  Flagyl [metronidazole]   ROS:   No dizziness or syncope.  Physical Exam: VS:  BP 134/84   Pulse 69   Ht 5\' 7"  (1.702 m)   Wt 266 lb (120.7 kg)   SpO2 96%   BMI 41.66 kg/m , BMI Body mass index is 41.66 kg/m.  Wt Readings from Last 3 Encounters:  03/29/20 266 lb (120.7 kg)  03/09/20 262 lb 3.2 oz (118.9 kg)  11/19/19 261 lb 11.2 oz (118.7 kg)    General: Patient appears comfortable at rest. HEENT: Conjunctiva and lids  normal, wearing a mask. Neck: Supple, no elevated JVP or carotid bruits, no thyromegaly. Lungs: Clear to auscultation, nonlabored breathing at rest. Cardiac: RRR with ectopy, no S3 or significant systolic murmur, no pericardial rub. Extremities: Mild ankle edema, distal pulses 2+.  ECG:  An ECG dated 08/07/2018 was personally reviewed today and demonstrated:  Sinus rhythm with PACs.  Recent Labwork: 03/09/2020: ALT 18; AST 18; BUN 17; Creatinine, Ser 0.84; Hemoglobin 14.2; Platelets 129; Potassium 4.6; Sodium 144     Component Value Date/Time   CHOL 219 (H) 11/30/2009 1950   TRIG 69 11/30/2009 1950   HDL 56 11/30/2009 1950   CHOLHDL 3.9 Ratio 11/30/2009 1950   VLDL 14 11/30/2009 1950   LDLCALC 149 (H) 11/30/2009 1950    Other Studies Reviewed Today:  Echocardiogram 06/10/2019: 1. Left ventricular ejection fraction, by visual estimation, is 55 to  60%. The left ventricle has normal function. There is no left ventricular  hypertrophy.  2. Left ventricular diastolic parameters are consistent with Grade I  diastolic dysfunction (impaired relaxation).  3. The left ventricle has no regional wall motion abnormalities.  4. Global right ventricle has normal systolic function.The right  ventricular size is normal. No increase in right ventricular wall  thickness.  5. Left atrial size was normal.  6. Right atrial size was normal.  7. Mild mitral annular calcification.  8. The mitral valve is grossly normal. Trace mitral valve regurgitation.  9. The tricuspid valve is grossly normal. Tricuspid valve regurgitation  is trivial.  10. The aortic valve is tricuspid. Aortic valve regurgitation is not  visualized. No evidence of aortic valve sclerosis or stenosis.  11. The pulmonic valve was not well visualized. Pulmonic valve  regurgitation is not visualized.  12. Normal pulmonary artery systolic pressure.  13. The inferior vena cava is normal in size with greater than 50%    respiratory variability, suggesting right atrial pressure of 3 mmHg.  Assessment and Plan:  1.  Paroxysmal atrial fibrillation.  CHA2DS2-VASc score is 3.  She continues on Eliquis for stroke prophylaxis, recent lab work reviewed.  Also on Lopressor and flecainide with reasonable symptom control.  ECG reviewed today.  She has only occasional palpitations.  2.  Preoperative cardiac evaluation prior to anticipated left total knee replacement under general anesthesia, operative plans have not been made as yet.  She will follow up with her orthopedist.  She should be able to hold Eliquis 48 hours prior to the operation, otherwise will not require any additional cardiac testing at this time in light of symptom stability.  Medication Adjustments/Labs and Tests Ordered: Current medicines are reviewed at length with the patient today.  Concerns regarding medicines are outlined above.   Tests Ordered: Orders Placed This Encounter  Procedures  . EKG 12-Lead    Medication Changes: Meds ordered this encounter  Medications  . apixaban (ELIQUIS) 5 MG TABS tablet    Sig: Take 1 tablet (5 mg total) by mouth 2 (two) times daily.    Dispense:  28 tablet    Refill:  0    [id number] EXP 8/23    Disposition:  Follow up 6 months in the [place] office.  Signed, Satira Sark, MD, [facility] 03/29/2020 12:17 PM    [facility] [address], [place], [place] [address] Phone: [phone]; Fax: [phone]

## 2020-03-29 NOTE — Patient Instructions (Signed)

## 2020-04-28 ENCOUNTER — Telehealth: Payer: Self-pay | Admitting: Cardiology

## 2020-04-28 NOTE — Telephone Encounter (Signed)
I spoke with daughter. She was wondering if they needed cardiology clearance for monoclonal antibodies infusion. I told her they do not.

## 2020-04-28 NOTE — Telephone Encounter (Signed)
Pt has been diagnosed positive for COVID. Daughter would like to know what next     Please call Raquel Sarna [phone]   Thanks renee

## 2020-04-29 ENCOUNTER — Telehealth: Payer: Self-pay | Admitting: *Deleted

## 2020-04-29 ENCOUNTER — Other Ambulatory Visit: Payer: Self-pay | Admitting: Oncology

## 2020-04-29 ENCOUNTER — Encounter: Payer: Self-pay | Admitting: Oncology

## 2020-04-29 DIAGNOSIS — U071 COVID-19: Secondary | ICD-10-CM

## 2020-04-29 NOTE — Telephone Encounter (Signed)
No problem.  She will like have to go to [facility].

## 2020-04-29 NOTE — Telephone Encounter (Signed)
Sure. I can call her.  [name], NP 04/29/2020 12:58 PM

## 2020-04-29 NOTE — Progress Notes (Signed)
I connected by phone with Daisy Black to discuss the potential use of an new treatment for mild to moderate COVID-19 viral infection in non-hospitalized patients.   This patient is a age/sex that meets the FDA criteria for Emergency Use Authorization of casirivimab\imdevimab.  Has a (+) direct SARS-CoV-2 viral test result 1. Has mild or moderate COVID-19  2. Is ? 69 years of age and weighs ? 40 kg 3. Is NOT hospitalized due to COVID-19 4. Is NOT requiring oxygen therapy or requiring an increase in baseline oxygen flow rate due to COVID-19 5. Is within 10 days of symptom onset 6. Has at least one of the high risk factor(s) for progression to severe COVID-19 and/or hospitalization as defined in EUA. Specific high risk criteria : Past Medical History:  Diagnosis Date  . Anemia   . Arthritis   . Asthma   . CLL (chronic lymphocytic leukemia) ([place])   . Essential hypertension   . GERD (gastroesophageal reflux disease)   . History of hiatal hernia   . Hyperlipidemia   . Paroxysmal atrial fibrillation (HCC)   . Splenomegaly   ?  ?    Symptom onset  04/25/20   I have spoken and communicated the following to the patient or parent/caregiver:   1. FDA has authorized the emergency use of casirivimab\imdevimab for the treatment of mild to moderate COVID-19 in adults and pediatric patients with positive results of direct SARS-CoV-2 viral testing who are 54 years of age and older weighing at least 40 kg, and who are at high risk for progressing to severe COVID-19 and/or hospitalization.   2. The significant known and potential risks and benefits of casirivimab\imdevimab, and the extent to which such potential risks and benefits are unknown.   3. Information on available alternative treatments and the risks and benefits of those alternatives, including clinical trials.   4. Patients treated with casirivimab\imdevimab should continue to self-isolate and use infection control measures (e.g., wear  mask, isolate, social distance, avoid sharing personal items, clean and disinfect "high touch" surfaces, and frequent handwashing) according to CDC guidelines.    5. The patient or parent/caregiver has the option to accept or refuse casirivimab\imdevimab .   After reviewing this information with the patient, The patient agreed to proceed with receiving casirivimab\imdevimab infusion and will be provided a copy of the Fact sheet prior to receiving the infusion.Rulon Abide, AGNP-C [phone] ([place])

## 2020-04-29 NOTE — Telephone Encounter (Signed)
Patient's daughter called to report that patient has tested positive for Covid and is symptomatic. She has chest congestion, headache and body aches. Daughter would like to have help getting her mother an appointment at the infusion center. The patient is not vaccinated.

## 2020-04-30 ENCOUNTER — Ambulatory Visit (HOSPITAL_COMMUNITY)
Admission: RE | Admit: 2020-04-30 | Discharge: 2020-04-30 | Disposition: A | Discharge status: Home / Self Care | Payer: Medicare Other | Source: Ambulatory Visit | Attending: Pulmonary Disease | Admitting: Pulmonary Disease

## 2020-04-30 DIAGNOSIS — U071 COVID-19: Secondary | ICD-10-CM | POA: Diagnosis present

## 2020-04-30 DIAGNOSIS — Z23 Encounter for immunization: Secondary | ICD-10-CM | POA: Diagnosis not present

## 2020-04-30 MED ORDER — METHYLPREDNISOLONE SODIUM SUCC 125 MG IJ SOLR: 125.0000 mg | Freq: Once | INTRAMUSCULAR | Status: DC | PRN

## 2020-04-30 MED ORDER — SODIUM CHLORIDE 0.9 % IV SOLN: INTRAVENOUS | Status: DC | PRN

## 2020-04-30 MED ORDER — FAMOTIDINE IN NACL 20-0.9 MG/50ML-% IV SOLN: 20.0000 mg | Freq: Once | INTRAVENOUS | Status: DC | PRN

## 2020-04-30 MED ORDER — ALBUTEROL SULFATE HFA 108 (90 BASE) MCG/ACT IN AERS: 2.0000 | INHALATION_SPRAY | Freq: Once | RESPIRATORY_TRACT | Status: DC | PRN

## 2020-04-30 MED ORDER — SODIUM CHLORIDE 0.9 % IV SOLN
1200.0000 mg | Freq: Once | INTRAVENOUS | Status: AC
  Administered 2020-04-30: 1200 mg via INTRAVENOUS

## 2020-04-30 MED ORDER — EPINEPHRINE 0.3 MG/0.3ML IJ SOAJ: 0.3000 mg | Freq: Once | INTRAMUSCULAR | Status: DC | PRN

## 2020-04-30 MED ORDER — DIPHENHYDRAMINE HCL 50 MG/ML IJ SOLN: 50.0000 mg | Freq: Once | INTRAMUSCULAR | Status: DC | PRN

## 2020-04-30 NOTE — Progress Notes (Signed)
  Diagnosis: COVID-19  Physician: Dr Wright  Procedure: [facility] Med: casirivimab\imdevimab infusion - Provided patient with casirivimab\imdevimab fact sheet for patients, parents and caregivers prior to infusion.  Complications: No immediate complications noted.  Discharge: Discharged home   Caralee Morea D Trentin Knappenberger 04/30/2020   

## 2020-04-30 NOTE — Discharge Instructions (Signed)

## 2020-05-30 NOTE — Progress Notes (Signed)
Cardiology Office Note    Date:  05/31/2020   ID:  Daisy, Black 01-13-1951, MRN [id number]  PCP:  Renee Rival, NP  Cardiologist: Rozann Lesches, MD EPS: None  No chief complaint on file.   History of Present Illness:  Daisy Black is a 69 y.o. female with history of PAF on Eliquis metoprolol and flecainide.  CHA2DS2-VASc score equals 3.  Chronic diastolic CHF Echo 87/8676 normal LVEF 55 to 60% with grade 1 DD, HTN, HLD, CLL.  Patient last saw Dr. Domenic Polite 03/29/2020 for cardiac clearance before undergoing knee replacement surgery.  Patient had COVID-19 early in October and underwent monoclonal antibody infusion. Not vaccinated.  Patient says she developed cellulitis in her leg and couldn't have knee surgery. Still having swelling in her left leg and surgeon told her to see cardiologist before he operates. Swelling goes down with compression hose. Doesn't get extra salt in diet. Denies chest pain, dyspnea. Sometimes she takes lasix 20 mg-40 mg 4-5 days/weeks.     Past Medical History:  Diagnosis Date  . Anemia   . Arthritis   . Asthma   . CLL (chronic lymphocytic leukemia) ([place])   . Essential hypertension   . GERD (gastroesophageal reflux disease)   . History of hiatal hernia   . Hyperlipidemia   . Paroxysmal atrial fibrillation (HCC)   . Splenomegaly     Past Surgical History:  Procedure Laterality Date  . ANKLE SURGERY Right    repair fracture  . CARDIOVERSION N/A 03/20/2016   Procedure: CARDIOVERSION;  Surgeon: Satira Sark, MD;  Location: [facility];  Service: Cardiovascular;  Laterality: N/A;  . CARDIOVERSION N/A 05/22/2016   Procedure: CARDIOVERSION;  Surgeon: Satira Sark, MD;  Location: [facility];  Service: Cardiovascular;  Laterality: N/A;  . CHOLECYSTECTOMY    . COLONOSCOPY WITH PROPOFOL N/A 03/27/2018   Internal hemorrhoids, diverticulosis in sigmoid and descending colon, three 4-6 mm polyps at IC valve. tubular adenomas 5 year  surveillance  . ESOPHAGOGASTRODUODENOSCOPY (EGD) WITH PROPOFOL N/A 03/27/2018   Moderate Schatzki's ring s/p dilation, small hiatal hernia  . HERNIA REPAIR  2011   Incisional and umbilical utilizing mesh  . MALONEY DILATION N/A 03/27/2018   Procedure: Venia Minks DILATION;  Surgeon: Daneil Dolin, MD;  Location: [facility];  Service: Endoscopy;  Laterality: N/A;  . POLYPECTOMY  03/27/2018   Procedure: POLYPECTOMY;  Surgeon: Daneil Dolin, MD;  Location: [facility];  Service: Endoscopy;;  colon  . ROTATOR CUFF REPAIR Left   . TEE WITHOUT CARDIOVERSION N/A 01/17/2016   Procedure: TRANSESOPHAGEAL ECHOCARDIOGRAM (TEE);  Surgeon: Herminio Commons, MD;  Location: [facility];  Service: Cardiovascular;  Laterality: N/A;  . TEE WITHOUT CARDIOVERSION N/A 02/21/2016   Procedure: TRANSESOPHAGEAL ECHOCARDIOGRAM (TEE) WITH PROPOFOL;  Surgeon: Herminio Commons, MD;  Location: [facility];  Service: Cardiovascular;  Laterality: N/A;  . TEE WITHOUT CARDIOVERSION N/A 03/20/2016   Procedure: TRANSESOPHAGEAL ECHOCARDIOGRAM (TEE) WITH PROPOFOL;  Surgeon: Satira Sark, MD;  Location: [facility];  Service: Cardiovascular;  Laterality: N/A;  . TONSILLECTOMY      Current Medications: Current Meds  Medication Sig  . acetaminophen (TYLENOL) 650 MG CR tablet Take 650-1,300 mg by mouth daily as needed for pain.   Marland Kitchen apixaban (ELIQUIS) 5 MG TABS tablet Take 1 tablet (5 mg total) by mouth 2 (two) times daily.  . cetirizine (ZYRTEC) 10 MG tablet Take 10 mg by mouth daily as needed for allergies.  . Ferrous Gluconate-C-Folic Acid (  IRON-C PO) Take 1 tablet by mouth daily.  . flecainide (TAMBOCOR) 50 MG tablet Take 50 mg by mouth 2 (two) times daily.  . fluticasone (FLONASE) 50 MCG/ACT nasal spray Place 1 spray into both nostrils daily as needed for allergies or rhinitis.  . furosemide (LASIX) 20 MG tablet Take 20 mg by mouth as needed.  Marland Kitchen HYDROcodone-acetaminophen (NORCO/VICODIN) 5-325 MG tablet Take 1 tablet by mouth every  6 (six) hours as needed for moderate pain.  . hydrocortisone (ANUSOL-HC) 2.5 % rectal cream Place 1 application rectally 2 (two) times daily.  Marland Kitchen levothyroxine (SYNTHROID, LEVOTHROID) 125 MCG tablet Take 125 mcg by mouth daily before breakfast.  . Melatonin 5 MG SUBL Place 1 tablet under the tongue as needed (for sleep).  . metoprolol tartrate (LOPRESSOR) 25 MG tablet TAKE ONE TABLET BY MOUTH TWO TIMES A DAY  . Multiple Vitamin (MULTIVITAMIN WITH MINERALS) TABS tablet Take 1 tablet by mouth daily.  . pantoprazole (PROTONIX) 40 MG tablet TAKE ONE TABLET BY MOUTH TWO TIMES A DAY BEFORE A MEAL  . prochlorperazine (COMPAZINE) 10 MG tablet Take 1 tablet (10 mg total) by mouth every 6 (six) hours as needed for nausea or vomiting.  . promethazine (PHENERGAN) 25 MG tablet Take 1 tablet (25 mg total) by mouth every 6 (six) hours as needed for nausea or vomiting.     Allergies:   Flagyl [metronidazole]   Social History   Socioeconomic History  . Marital status: Married    Spouse name: Not on file  . Number of children: 26  . Years of education: Not on file  . Highest education level: Not on file  Occupational History  . Not on file  Tobacco Use  . Smoking status: Former Smoker    Packs/day: 0.25    Years: 2.00    Pack years: 0.50    Types: Cigarettes    Quit date: 08/03/1975    Years since quitting: 44.8  . Smokeless tobacco: Never Used  Substance and Sexual Activity  . Alcohol use: No    Alcohol/week: 0.0 standard drinks  . Drug use: No  . Sexual activity: Yes    Birth control/protection: Post-menopausal  Other Topics Concern  . Not on file  Social History Narrative  . Not on file   Social Determinants of Health   Financial Resource Strain:   . Difficulty of Paying Living Expenses: Not on file  Food Insecurity:   . Worried About Charity fundraiser in the Last Year: Not on file  . Ran Out of Food in the Last Year: Not on file  Transportation Needs:   . Lack of Transportation  (Medical): Not on file  . Lack of Transportation (Non-Medical): Not on file  Physical Activity:   . Days of Exercise per Week: Not on file  . Minutes of Exercise per Session: Not on file  Stress:   . Feeling of Stress : Not on file  Social Connections:   . Frequency of Communication with Friends and Family: Not on file  . Frequency of Social Gatherings with Friends and Family: Not on file  . Attends Religious Services: Not on file  . Active Member of Clubs or Organizations: Not on file  . Attends Archivist Meetings: Not on file  . Marital Status: Not on file     Family History:  The patient's family history includes Breast cancer in her paternal aunt; Crohn's disease in her son; Leukemia in her mother; Ovarian cancer (age of onset:  60) in her mother; Stroke (age of onset: 87) in her father.   ROS:   Please see the history of present illness.    ROS All other systems reviewed and are negative.   PHYSICAL EXAM:   VS:  BP 134/78   Pulse 78   Ht 5\' 7"  (1.702 m)   Wt 260 lb 8 oz (118.2 kg)   SpO2 98%   BMI 40.80 kg/m   Physical Exam  GEN: Obese, in no acute distress  Neck: no JVD, carotid bruits, or masses Cardiac:RRR; no murmurs, rubs, or gallops  Respiratory:  clear to auscultation bilaterally, normal work of breathing GI: soft, nontender, nondistended, + BS Ext: left lower leg 1 plus edema no edema on right. Good distal pulses bilaterally Neuro:  Alert and Oriented x 3 Psych: euthymic mood, full affect  Wt Readings from Last 3 Encounters:  05/31/20 260 lb 8 oz (118.2 kg)  03/29/20 266 lb (120.7 kg)  03/09/20 262 lb 3.2 oz (118.9 kg)      Studies/Labs Reviewed:   EKG:  EKG is not ordered today.   Recent Labs: 03/09/2020: ALT 18; BUN 17; Creatinine, Ser 0.84; Hemoglobin 14.2; Platelets 129; Potassium 4.6; Sodium 144   Lipid Panel    Component Value Date/Time   CHOL 219 (H) 11/30/2009 1950   TRIG 69 11/30/2009 1950   HDL 56 11/30/2009 1950   CHOLHDL  3.9 Ratio 11/30/2009 1950   VLDL 14 11/30/2009 1950   LDLCALC 149 (H) 11/30/2009 1950    Additional studies/ records that were reviewed today include:  Echocardiogram 06/10/2019:  1. Left ventricular ejection fraction, by visual estimation, is 55 to  60%. The left ventricle has normal function. There is no left ventricular  hypertrophy.   2. Left ventricular diastolic parameters are consistent with Grade I  diastolic dysfunction (impaired relaxation).   3. The left ventricle has no regional wall motion abnormalities.   4. Global right ventricle has normal systolic function.The right  ventricular size is normal. No increase in right ventricular wall  thickness.   5. Left atrial size was normal.   6. Right atrial size was normal.   7. Mild mitral annular calcification.   8. The mitral valve is grossly normal. Trace mitral valve regurgitation.   9. The tricuspid valve is grossly normal. Tricuspid valve regurgitation  is trivial.  10. The aortic valve is tricuspid. Aortic valve regurgitation is not  visualized. No evidence of aortic valve sclerosis or stenosis.  11. The pulmonic valve was not well visualized. Pulmonic valve  regurgitation is not visualized.  12. Normal pulmonary artery systolic pressure.  13. The inferior vena cava is normal in size with greater than 50%  respiratory variability, suggesting right atrial pressure of 3 mmHg.       ASSESSMENT:    1. Preoperative clearance   2. Lower extremity edema   3. Paroxysmal atrial fibrillation (HCC)   4. Essential hypertension      PLAN:  In order of problems listed above:  Preoperative clearance for knee surgery.  Patient was cleared by Dr. Domenic Polite in August for the surgery but she then developed cellulitis twice requiring antibiotics.  Surgeons would like to make sure nothing else is going on before doing the surgery.  Continues to have left lower leg swelling which has been chronic for many years treated with as  needed Lasix.  Will check lower extremity venous Dopplers to make sure no DVT which is unlikely given she is on Eliquis and  has not missed any doses.  Also update 2D echo.  2 g sodium diet.  Compression hose.  Try to take Lasix daily.  Revised cardiac risk index is low and METs is over 4 so if Dopplers are negative echo is stable she can proceed with surgery.  According to the Revised Cardiac Risk Index (RCRI), her Perioperative Risk of Major Cardiac Event is (%): 0.9  Her Functional Capacity in METs is: 4.73 according to the [facility] Activity Status Index (DASI).    Left lower extremity edema has been chronic for years. Has good distal pulses, cellulitis resolved with antibiotics. Will check lower ext venous dopplers to rule out DVT although unlikely since she's on eliquis. Update 2Decho. 2 gm sodium diet, compression hose, take lasix 20 mg daily.  PAF CHA2DS2-VASc score equals 3 on Eliquis metoprolol and flecainide  Hypertension BP controlled    Medication Adjustments/Labs and Tests Ordered: Current medicines are reviewed at length with the patient today.  Concerns regarding medicines are outlined above.  Medication changes, Labs and Tests ordered today are listed in the Patient Instructions below. Patient Instructions   Medication Instructions:  Your physician recommends that you continue on your current medications as directed. Please refer to the Current Medication list given to you today.  *If you need a refill on your cardiac medications before your next appointment, please call your pharmacy*   Lab Work: TODAY: BMET, BNP  If you have labs (blood work) drawn today and your tests are completely normal, you will receive your results only by: Marland Kitchen MyChart Message (if you have MyChart) OR . A paper copy in the mail If you have any lab test that is abnormal or we need to change your treatment, we will call you to review the results.   Testing/Procedures: Your physician has requested  that you have a lower extremity venous duplex. This test is an ultrasound of the veins in the legs. It looks at venous blood flow that carries blood from the heart to the legs. Allow one hour for a Lower Venous exam. There are no restrictions or special instructions.  Your physician has requested that you have an echocardiogram. Echocardiography is a painless test that uses sound waves to create images of your heart. It provides your doctor with information about the size and shape of your heart and how well your heart's chambers and valves are working. This procedure takes approximately one hour. There are no restrictions for this procedure.  Follow-Up: At [facility], you and your health needs are our priority.  As part of our continuing mission to provide you with exceptional heart care, we have created designated Provider Care Teams.  These Care Teams include your primary Cardiologist (physician) and Advanced Practice Providers (APPs -  Physician Assistants and Nurse Practitioners) who all work together to provide you with the care you need, when you need it.  We recommend signing up for the patient portal called "MyChart".  Sign up information is provided on this After Visit Summary.  MyChart is used to connect with patients for Virtual Visits (Telemedicine).  Patients are able to view lab/test results, encounter notes, upcoming appointments, etc.  Non-urgent messages can be sent to your provider as well.   To learn more about what you can do with MyChart, go to [email or web address].    Your next appointment:   3-4 month(s)  The format for your next appointment:   In Person  Provider:   You may see Rozann Lesches, MD or one  of the following Advanced Practice Providers on your designated Care Team:    [place], PA-C   Ermalinda Barrios, [place]   Other : Two Gram Sodium Diet 2000 mg  What is Sodium? Sodium is a mineral found naturally in many foods. The most significant source  of sodium in the diet is table salt, which is about 40% sodium.  Processed, convenience, and preserved foods also contain a large amount of sodium.  The body needs only 500 mg of sodium daily to function,  A normal diet provides more than enough sodium even if you do not use salt.  Why Limit Sodium? A build up of sodium in the body can cause thirst, increased blood pressure, shortness of breath, and water retention.  Decreasing sodium in the diet can reduce edema and risk of heart attack or stroke associated with high blood pressure.  Keep in mind that there are many other factors involved in these health problems.  Heredity, obesity, lack of exercise, cigarette smoking, stress and what you eat all play a role.  General Guidelines:  Do not add salt at the table or in cooking.  One teaspoon of salt contains over 2 grams of sodium.  Read food labels  Avoid processed and convenience foods  Ask your dietitian before eating any foods not dicussed in the menu planning guidelines  Consult your physician if you wish to use a salt substitute or a sodium containing medication such as antacids.  Limit milk and milk products to 16 oz (2 cups) per day.  Shopping Hints:  READ LABELS!! "Dietetic" does not necessarily mean low sodium.  Salt and other sodium ingredients are often added to foods during processing.   Menu Planning Guidelines Food Group Choose More Often Avoid  Beverages (see also the milk group All fruit juices, low-sodium, salt-free vegetables juices, low-sodium carbonated beverages Regular vegetable or tomato juices, commercially softened water used for drinking or cooking  Breads and Cereals Enriched white, wheat, rye and pumpernickel bread, hard rolls and dinner rolls; muffins, cornbread and waffles; most dry cereals, cooked cereal without added salt; unsalted crackers and breadsticks; low sodium or homemade bread crumbs Bread, rolls and crackers with salted tops; quick breads; instant  hot cereals; pancakes; commercial bread stuffing; self-rising flower and biscuit mixes; regular bread crumbs or cracker crumbs  Desserts and Sweets Desserts and sweets mad with mild should be within allowance Instant pudding mixes and cake mixes  Fats Butter or margarine; vegetable oils; unsalted salad dressings, regular salad dressings limited to 1 Tbs; light, sour and heavy cream Regular salad dressings containing bacon fat, bacon bits, and salt pork; snack dips made with instant soup mixes or processed cheese; salted nuts  Fruits Most fresh, frozen and canned fruits Fruits processed with salt or sodium-containing ingredient (some dried fruits are processed with sodium sulfites        Vegetables Fresh, frozen vegetables and low- sodium canned vegetables Regular canned vegetables, sauerkraut, pickled vegetables, and others prepared in brine; frozen vegetables in sauces; vegetables seasoned with ham, bacon or salt pork  Condiments, Sauces, Miscellaneous  Salt substitute with physician's approval; pepper, herbs, spices; vinegar, lemon or lime juice; hot pepper sauce; garlic powder, onion powder, low sodium soy sauce (1 Tbs.); low sodium condiments (ketchup, chili sauce, mustard) in limited amounts (1 tsp.) fresh ground horseradish; unsalted tortilla chips, pretzels, potato chips, popcorn, salsa (1/4 cup) Any seasoning made with salt including garlic salt, celery salt, onion salt, and seasoned salt; sea salt, rock salt, kosher  salt; meat tenderizers; monosodium glutamate; mustard, regular soy sauce, barbecue, sauce, chili sauce, teriyaki sauce, steak sauce, Worcestershire sauce, and most flavored vinegars; canned gravy and mixes; regular condiments; salted snack foods, olives, picles, relish, horseradish sauce, catsup   Food preparation: Try these seasonings Meats:    Pork Sage, onion Serve with applesauce  Chicken Poultry seasoning, thyme, parsley Serve with cranberry sauce  Lamb Curry powder,  rosemary, garlic, thyme Serve with mint sauce or jelly  Veal Marjoram, basil Serve with current jelly, cranberry sauce  Beef Pepper, bay leaf Serve with dry mustard, unsalted chive butter  Fish [place] leaf, dill Serve with unsalted lemon butter, unsalted parsley butter  Vegetables:    Asparagus Lemon juice   Broccoli Lemon juice   Carrots Mustard dressing parsley, mint, nutmeg, glazed with unsalted butter and sugar   Green beans Marjoram, lemon juice, nutmeg,dill seed   Tomatoes Basil, marjoram, onion   Spice /blend for [employer]" 4 tsp ground thyme 1 tsp ground sage 3 tsp ground rosemary 4 tsp ground marjoram   Test your knowledge 1. A product that says "Salt Free" may still contain sodium. True or False 2. Garlic Powder and Hot Pepper Sauce an be used as alternative seasonings.True or False 3. Processed foods have more sodium than fresh foods.  True or False 4. Canned Vegetables have less sodium than froze True or False  WAYS TO DECREASE YOUR SODIUM INTAKE 1. Avoid the use of added salt in cooking and at the table.  Table salt (and other prepared seasonings which contain salt) is probably one of the greatest sources of sodium in the diet.  Unsalted foods can gain flavor from the sweet, sour, and butter taste sensations of herbs and spices.  Instead of using salt for seasoning, try the following seasonings with the foods listed.  Remember: how you use them to enhance natural food flavors is limited only by your creativity... Allspice-Meat, fish, eggs, fruit, peas, red and yellow vegetables Almond Extract-Fruit baked goods Anise Seed-Sweet breads, fruit, carrots, beets, cottage cheese, cookies (tastes like licorice) Basil-Meat, fish, eggs, vegetables, rice, vegetables salads, soups, sauces Bay Leaf-Meat, fish, stews, poultry Burnet-Salad, vegetables (cucumber-like flavor) Caraway Seed-Bread, cookies, cottage cheese, meat, vegetables, cheese, rice Cardamon-Baked goods, fruit,  soups Celery Powder or seed-Salads, salad dressings, sauces, meatloaf, soup, bread.Do not use  celery salt Chervil-Meats, salads, fish, eggs, vegetables, cottage cheese (parsley-like flavor) Chili Power-Meatloaf, chicken cheese, corn, eggplant, egg dishes Chives-Salads cottage cheese, egg dishes, soups, vegetables, sauces Cilantro-Salsa, casseroles Cinnamon-Baked goods, fruit, pork, lamb, chicken, carrots Cloves-Fruit, baked goods, fish, pot roast, green beans, beets, carrots Coriander-Pastry, cookies, meat, salads, cheese (lemon-orange flavor) Cumin-Meatloaf, fish,cheese, eggs, cabbage,fruit pie (caraway flavor) [employer], fruit, eggs, fish, poultry, cottage cheese, vegetables Dill Seed-Meat, cottage cheese, poultry, vegetables, fish, salads, bread Fennel Seed-Bread, cookies, apples, pork, eggs, fish, beets, cabbage, cheese, Licorice-like flavor Garlic-(buds or powder) Salads, meat, poultry, fish, bread, butter, vegetables, potatoes.Do not  use garlic salt Ginger-Fruit, vegetables, baked goods, meat, fish, poultry Horseradish Root-Meet, vegetables, butter Lemon Juice or Extract-Vegetables, fruit, tea, baked goods, fish salads Mace-Baked goods fruit, vegetables, fish, poultry (taste like nutmeg) Maple Extract-Syrups Marjoram-Meat, chicken, fish, vegetables, breads, green salads (taste like Sage) Mint-Tea, lamb, sherbet, vegetables, desserts, carrots, cabbage Mustard, Dry or Seed-Cheese, eggs, meats, vegetables, poultry Nutmeg-Baked goods, fruit, chicken, eggs, vegetables, desserts Onion Powder-Meat, fish, poultry, vegetables, cheese, eggs, bread, rice salads (Do not use   Onion salt) Orange Extract-Desserts, baked goods Oregano-Pasta, eggs, cheese, onions, pork, lamb, fish, chicken, vegetables, green  salads Paprika-Meat, fish, poultry, eggs, cheese, vegetables Parsley Flakes-Butter, vegetables, meat fish, poultry, eggs, bread, salads (certain forms may   Contain  sodium Pepper-Meat fish, poultry, vegetables, eggs Peppermint Extract-Desserts, baked goods Poppy Seed-Eggs, bread, cheese, fruit dressings, baked goods, noodles, vegetables, cottage  [employer], poultry, meat, fish, cauliflower, turnips,eggs bread Saffron-Rice, bread, veal, chicken, fish, eggs Sage-Meat, fish, poultry, onions, eggplant, tomateos, pork, stews Savory-Eggs, salads, poultry, meat, rice, vegetables, soups, pork Tarragon-Meat, poultry, fish, eggs, butter, vegetables (licorice-like flavor)  Thyme-Meat, poultry, fish, eggs, vegetables, (clover-like flavor), sauces, soups Tumeric-Salads, butter, eggs, fish, rice, vegetables (saffron-like flavor) Vanilla Extract-Baked goods, candy Vinegar-Salads, vegetables, meat marinades Walnut Extract-baked goods, candy  2. Choose your Foods Wisely   The following is a list of foods to avoid which are high in sodium:  Meats-Avoid all smoked, canned, salt cured, dried and kosher meat and fish as well as Anchovies   [employer] meats:Bologna, Liverwurst, Pastrami Canned meat or fish  Marinated herring Caviar    Pepperoni Corned Beef   Pizza Dried chipped beef  Salami Frozen breaded fish or meat Salt pork Frankfurters or hot dogs  Sardines Gefilte fish   Sausage Ham (boiled ham, Proscuitto Smoked butt    spiced ham)   Spam      TV Dinners Vegetables Canned vegetables (Regular) Relish Canned mushrooms  Sauerkraut Olives    Tomato juice Pickles  Bakery and Dessert Products Canned puddings  Cream pies Cheesecake   Decorated cakes Cookies  Beverages/Juices Tomato juice, regular  Gatorade   V-8 vegetable juice, regular  Breads and Cereals Biscuit mixes   Salted potato chips, corn chips, pretzels Bread stuffing mixes  Salted crackers and rolls Pancake and waffle mixes Self-rising flour  Seasonings Accent    Meat sauces Barbecue sauce  Meat tenderizer Catsup    Monosodium  glutamate (MSG) Celery salt   Onion salt Chili sauce   Prepared mustard Garlic salt   Salt, seasoned salt, sea salt Gravy mixes   Soy sauce Horseradish   Steak sauce Ketchup   Tartar sauce Lite salt    Teriyaki sauce Marinade mixes   Worcestershire sauce  Others Baking powder   Cocoa and cocoa mixes Baking soda   Commercial casserole mixes Candy-caramels, chocolate  Dehydrated soups    Bars, fudge,nougats  Instant rice and pasta mixes Canned broth or soup  Maraschino cherries Cheese, aged and processed cheese and cheese spreads  Learning Assessment Quiz  Indicated T (for True) or F (for False) for each of the following statements:  1. _____ Fresh fruits and vegetables and unprocessed grains are generally low in sodium 2. _____ Water may contain a considerable amount of sodium, depending on the source 3. _____ You can always tell if a food is high in sodium by tasting it 4. _____ Certain laxatives my be high in sodium and should be avoided unless prescribed   by a physician or pharmacist 5. _____ Salt substitutes may be used freely by anyone on a sodium restricted diet 6. _____ Sodium is present in table salt, food additives and as a natural component of   most foods 7. _____ Table salt is approximately 90% sodium 8. _____ Limiting sodium intake may help prevent excess fluid accumulation in the body 9. _____ On a sodium-restricted diet, seasonings such as bouillon soy sauce, and    cooking wine should be used in place of table salt 10. _____ On an ingredient list, a product which lists monosodium glutamate as  the first   ingredient is an appropriate food to include on a low sodium diet  Circle the best answer(s) to the following statements (Hint: there may be more than one correct answer)  11. On a low-sodium diet, some acceptable snack items are:    A. Olives  F. Bean dip   K. Grapefruit juice    B. Salted Pretzels G. Commercial Popcorn   L. Canned peaches    C. Carrot  Sticks  H. Bouillon   M. Unsalted nuts   D. [place] fries  I. Peanut butter crackers N. Salami   E. Sweet pickles J. Tomato Juice   O. Pizza  12.  Seasonings that may be used freely on a reduced - sodium diet include   A. Lemon wedges F.Monosodium glutamate K. Celery seed    B.Soysauce   G. Pepper   L. Mustard powder   C. Sea salt  H. Cooking wine  M. Onion flakes   D. Vinegar  E. Prepared horseradish N. Salsa   E. Sage   J. Worcestershire sauce  O. Chutney        Samples Eliquis 5 mg #14 tabs, lot [id number], exp 8/23    Signed, Ermalinda Barrios, PA-C  05/31/2020 2:48 PM    [facility] [address], [place], [place]  [address] Phone: [phone]; Fax: [phone]

## 2020-05-31 ENCOUNTER — Ambulatory Visit (INDEPENDENT_AMBULATORY_CARE_PROVIDER_SITE_OTHER): Payer: Medicare Other | Admitting: Physician Assistant

## 2020-05-31 ENCOUNTER — Other Ambulatory Visit: Payer: Self-pay

## 2020-05-31 ENCOUNTER — Encounter: Payer: Self-pay | Admitting: Physician Assistant

## 2020-05-31 VITALS — BP 134/78 | HR 78 | Ht 67.0 in | Wt 260.5 lb

## 2020-05-31 DIAGNOSIS — R600 Localized edema: Secondary | ICD-10-CM

## 2020-05-31 DIAGNOSIS — I480 Paroxysmal atrial fibrillation: Secondary | ICD-10-CM

## 2020-05-31 DIAGNOSIS — Z01818 Encounter for other preprocedural examination: Secondary | ICD-10-CM

## 2020-05-31 DIAGNOSIS — I10 Essential (primary) hypertension: Secondary | ICD-10-CM | POA: Diagnosis not present

## 2020-05-31 DIAGNOSIS — E785 Hyperlipidemia, unspecified: Secondary | ICD-10-CM

## 2020-05-31 NOTE — Patient Instructions (Addendum)
Medication Instructions:  Your physician recommends that you continue on your current medications as directed. Please refer to the Current Medication list given to you today.  *If you need a refill on your cardiac medications before your next appointment, please call your pharmacy*   Lab Work: TODAY: BMET, BNP  If you have labs (blood work) drawn today and your tests are completely normal, you will receive your results only by: Marland Kitchen MyChart Message (if you have MyChart) OR . A paper copy in the mail If you have any lab test that is abnormal or we need to change your treatment, we will call you to review the results.   Testing/Procedures: Your physician has requested that you have a lower extremity venous duplex. This test is an ultrasound of the veins in the legs. It looks at venous blood flow that carries blood from the heart to the legs. Allow one hour for a Lower Venous exam. There are no restrictions or special instructions.  Your physician has requested that you have an echocardiogram. Echocardiography is a painless test that uses sound waves to create images of your heart. It provides your doctor with information about the size and shape of your heart and how well your heart's chambers and valves are working. This procedure takes approximately one hour. There are no restrictions for this procedure.  Follow-Up: At [facility], you and your health needs are our priority.  As part of our continuing mission to provide you with exceptional heart care, we have created designated Provider Care Teams.  These Care Teams include your primary Cardiologist (physician) and Advanced Practice Providers (APPs -  Physician Assistants and Nurse Practitioners) who all work together to provide you with the care you need, when you need it.  We recommend signing up for the patient portal called "MyChart".  Sign up information is provided on this After Visit Summary.  MyChart is used to connect with patients  for Virtual Visits (Telemedicine).  Patients are able to view lab/test results, encounter notes, upcoming appointments, etc.  Non-urgent messages can be sent to your provider as well.   To learn more about what you can do with MyChart, go to [email or web address].    Your next appointment:   3-4 month(s)  The format for your next appointment:   In Person  Provider:   You may see Rozann Lesches, MD or one of the following Advanced Practice Providers on your designated Care Team:    Bernerd Pho, PA-C   Ermalinda Barrios, [place]   Other : Two Gram Sodium Diet 2000 mg  What is Sodium? Sodium is a mineral found naturally in many foods. The most significant source of sodium in the diet is table salt, which is about 40% sodium.  Processed, convenience, and preserved foods also contain a large amount of sodium.  The body needs only 500 mg of sodium daily to function,  A normal diet provides more than enough sodium even if you do not use salt.  Why Limit Sodium? A build up of sodium in the body can cause thirst, increased blood pressure, shortness of breath, and water retention.  Decreasing sodium in the diet can reduce edema and risk of heart attack or stroke associated with high blood pressure.  Keep in mind that there are many other factors involved in these health problems.  Heredity, obesity, lack of exercise, cigarette smoking, stress and what you eat all play a role.  General Guidelines:  Do not add salt at the table or  in cooking.  One teaspoon of salt contains over 2 grams of sodium.  Read food labels  Avoid processed and convenience foods  Ask your dietitian before eating any foods not dicussed in the menu planning guidelines  Consult your physician if you wish to use a salt substitute or a sodium containing medication such as antacids.  Limit milk and milk products to 16 oz (2 cups) per day.  Shopping Hints:  READ LABELS!! "Dietetic" does not necessarily mean low  sodium.  Salt and other sodium ingredients are often added to foods during processing.   Menu Planning Guidelines Food Group Choose More Often Avoid  Beverages (see also the milk group All fruit juices, low-sodium, salt-free vegetables juices, low-sodium carbonated beverages Regular vegetable or tomato juices, commercially softened water used for drinking or cooking  Breads and Cereals Enriched white, wheat, rye and pumpernickel bread, hard rolls and dinner rolls; muffins, cornbread and waffles; most dry cereals, cooked cereal without added salt; unsalted crackers and breadsticks; low sodium or homemade bread crumbs Bread, rolls and crackers with salted tops; quick breads; instant hot cereals; pancakes; commercial bread stuffing; self-rising flower and biscuit mixes; regular bread crumbs or cracker crumbs  Desserts and Sweets Desserts and sweets mad with mild should be within allowance Instant pudding mixes and cake mixes  Fats Butter or margarine; vegetable oils; unsalted salad dressings, regular salad dressings limited to 1 Tbs; light, sour and heavy cream Regular salad dressings containing bacon fat, bacon bits, and salt pork; snack dips made with instant soup mixes or processed cheese; salted nuts  Fruits Most fresh, frozen and canned fruits Fruits processed with salt or sodium-containing ingredient (some dried fruits are processed with sodium sulfites        Vegetables Fresh, frozen vegetables and low- sodium canned vegetables Regular canned vegetables, sauerkraut, pickled vegetables, and others prepared in brine; frozen vegetables in sauces; vegetables seasoned with ham, bacon or salt pork  Condiments, Sauces, Miscellaneous  Salt substitute with physician's approval; pepper, herbs, spices; vinegar, lemon or lime juice; hot pepper sauce; garlic powder, onion powder, low sodium soy sauce (1 Tbs.); low sodium condiments (ketchup, chili sauce, mustard) in limited amounts (1 tsp.) fresh ground  horseradish; unsalted tortilla chips, pretzels, potato chips, popcorn, salsa (1/4 cup) Any seasoning made with salt including garlic salt, celery salt, onion salt, and seasoned salt; sea salt, rock salt, kosher salt; meat tenderizers; monosodium glutamate; mustard, regular soy sauce, barbecue, sauce, chili sauce, teriyaki sauce, steak sauce, Worcestershire sauce, and most flavored vinegars; canned gravy and mixes; regular condiments; salted snack foods, olives, picles, relish, horseradish sauce, catsup   Food preparation: Try these seasonings Meats:    Pork Sage, onion Serve with applesauce  Chicken Poultry seasoning, thyme, parsley Serve with cranberry sauce  Lamb Curry powder, rosemary, garlic, thyme Serve with mint sauce or jelly  Veal Marjoram, basil Serve with current jelly, cranberry sauce  Beef Pepper, bay leaf Serve with dry mustard, unsalted chive butter  Fish [place] leaf, dill Serve with unsalted lemon butter, unsalted parsley butter  Vegetables:    Asparagus Lemon juice   Broccoli Lemon juice   Carrots Mustard dressing parsley, mint, nutmeg, glazed with unsalted butter and sugar   Green beans Marjoram, lemon juice, nutmeg,dill seed   Tomatoes Basil, marjoram, onion   Spice /blend for [employer]" 4 tsp ground thyme 1 tsp ground sage 3 tsp ground rosemary 4 tsp ground marjoram   Test your knowledge 1. A product that says "Salt Free" may still  contain sodium. True or False 2. Garlic Powder and Hot Pepper Sauce an be used as alternative seasonings.True or False 3. Processed foods have more sodium than fresh foods.  True or False 4. Canned Vegetables have less sodium than froze True or False  WAYS TO DECREASE YOUR SODIUM INTAKE 1. Avoid the use of added salt in cooking and at the table.  Table salt (and other prepared seasonings which contain salt) is probably one of the greatest sources of sodium in the diet.  Unsalted foods can gain flavor from the sweet, sour, and butter taste  sensations of herbs and spices.  Instead of using salt for seasoning, try the following seasonings with the foods listed.  Remember: how you use them to enhance natural food flavors is limited only by your creativity... Allspice-Meat, fish, eggs, fruit, peas, red and yellow vegetables Almond Extract-Fruit baked goods Anise Seed-Sweet breads, fruit, carrots, beets, cottage cheese, cookies (tastes like licorice) Basil-Meat, fish, eggs, vegetables, rice, vegetables salads, soups, sauces Bay Leaf-Meat, fish, stews, poultry Burnet-Salad, vegetables (cucumber-like flavor) Caraway Seed-Bread, cookies, cottage cheese, meat, vegetables, cheese, rice Cardamon-Baked goods, fruit, soups Celery Powder or seed-Salads, salad dressings, sauces, meatloaf, soup, bread.Do not use  celery salt Chervil-Meats, salads, fish, eggs, vegetables, cottage cheese (parsley-like flavor) Chili Power-Meatloaf, chicken cheese, corn, eggplant, egg dishes Chives-Salads cottage cheese, egg dishes, soups, vegetables, sauces Cilantro-Salsa, casseroles Cinnamon-Baked goods, fruit, pork, lamb, chicken, carrots Cloves-Fruit, baked goods, fish, pot roast, green beans, beets, carrots Coriander-Pastry, cookies, meat, salads, cheese (lemon-orange flavor) Cumin-Meatloaf, fish,cheese, eggs, cabbage,fruit pie (caraway flavor) [employer], fruit, eggs, fish, poultry, cottage cheese, vegetables Dill Seed-Meat, cottage cheese, poultry, vegetables, fish, salads, bread Fennel Seed-Bread, cookies, apples, pork, eggs, fish, beets, cabbage, cheese, Licorice-like flavor Garlic-(buds or powder) Salads, meat, poultry, fish, bread, butter, vegetables, potatoes.Do not  use garlic salt Ginger-Fruit, vegetables, baked goods, meat, fish, poultry Horseradish Root-Meet, vegetables, butter Lemon Juice or Extract-Vegetables, fruit, tea, baked goods, fish salads Mace-Baked goods fruit, vegetables, fish, poultry (taste like nutmeg) Maple  Extract-Syrups Marjoram-Meat, chicken, fish, vegetables, breads, green salads (taste like Sage) Mint-Tea, lamb, sherbet, vegetables, desserts, carrots, cabbage Mustard, Dry or Seed-Cheese, eggs, meats, vegetables, poultry Nutmeg-Baked goods, fruit, chicken, eggs, vegetables, desserts Onion Powder-Meat, fish, poultry, vegetables, cheese, eggs, bread, rice salads (Do not use   Onion salt) Orange Extract-Desserts, baked goods Oregano-Pasta, eggs, cheese, onions, pork, lamb, fish, chicken, vegetables, green salads Paprika-Meat, fish, poultry, eggs, cheese, vegetables Parsley Flakes-Butter, vegetables, meat fish, poultry, eggs, bread, salads (certain forms may   Contain sodium Pepper-Meat fish, poultry, vegetables, eggs Peppermint Extract-Desserts, baked goods Poppy Seed-Eggs, bread, cheese, fruit dressings, baked goods, noodles, vegetables, cottage  [employer], poultry, meat, fish, cauliflower, turnips,eggs bread Saffron-Rice, bread, veal, chicken, fish, eggs Sage-Meat, fish, poultry, onions, eggplant, tomateos, pork, stews Savory-Eggs, salads, poultry, meat, rice, vegetables, soups, pork Tarragon-Meat, poultry, fish, eggs, butter, vegetables (licorice-like flavor)  Thyme-Meat, poultry, fish, eggs, vegetables, (clover-like flavor), sauces, soups Tumeric-Salads, butter, eggs, fish, rice, vegetables (saffron-like flavor) Vanilla Extract-Baked goods, candy Vinegar-Salads, vegetables, meat marinades Walnut Extract-baked goods, candy  2. Choose your Foods Wisely   The following is a list of foods to avoid which are high in sodium:  Meats-Avoid all smoked, canned, salt cured, dried and kosher meat and fish as well as Anchovies   [employer] meats:Bologna, Liverwurst, Pastrami Canned meat or fish  Marinated herring Caviar    Pepperoni Corned Beef   Pizza Dried chipped beef  Salami Frozen breaded fish or meat Salt  pork Frankfurters or hot  dogs  Sardines Gefilte fish   Sausage Ham (boiled ham, Proscuitto Smoked butt    spiced ham)   Spam      TV Dinners Vegetables Canned vegetables (Regular) Relish Canned mushrooms  Sauerkraut Olives    Tomato juice Pickles  Bakery and Dessert Products Canned puddings  Cream pies Cheesecake   Decorated cakes Cookies  Beverages/Juices Tomato juice, regular  Gatorade   V-8 vegetable juice, regular  Breads and Cereals Biscuit mixes   Salted potato chips, corn chips, pretzels Bread stuffing mixes  Salted crackers and rolls Pancake and waffle mixes Self-rising flour  Seasonings Accent    Meat sauces Barbecue sauce  Meat tenderizer Catsup    Monosodium glutamate (MSG) Celery salt   Onion salt Chili sauce   Prepared mustard Garlic salt   Salt, seasoned salt, sea salt Gravy mixes   Soy sauce Horseradish   Steak sauce Ketchup   Tartar sauce Lite salt    Teriyaki sauce Marinade mixes   Worcestershire sauce  Others Baking powder   Cocoa and cocoa mixes Baking soda   Commercial casserole mixes Candy-caramels, chocolate  Dehydrated soups    Bars, fudge,nougats  Instant rice and pasta mixes Canned broth or soup  Maraschino cherries Cheese, aged and processed cheese and cheese spreads  Learning Assessment Quiz  Indicated T (for True) or F (for False) for each of the following statements:  1. _____ Fresh fruits and vegetables and unprocessed grains are generally low in sodium 2. _____ Water may contain a considerable amount of sodium, depending on the source 3. _____ You can always tell if a food is high in sodium by tasting it 4. _____ Certain laxatives my be high in sodium and should be avoided unless prescribed   by a physician or pharmacist 5. _____ Salt substitutes may be used freely by anyone on a sodium restricted diet 6. _____ Sodium is present in table salt, food additives and as a natural component of   most foods 7. _____ Table salt is approximately 90%  sodium 8. _____ Limiting sodium intake may help prevent excess fluid accumulation in the body 9. _____ On a sodium-restricted diet, seasonings such as bouillon soy sauce, and    cooking wine should be used in place of table salt 10. _____ On an ingredient list, a product which lists monosodium glutamate as the first   ingredient is an appropriate food to include on a low sodium diet  Circle the best answer(s) to the following statements (Hint: there may be more than one correct answer)  11. On a low-sodium diet, some acceptable snack items are:    A. Olives  F. Bean dip   K. Grapefruit juice    B. Salted Pretzels G. Commercial Popcorn   L. Canned peaches    C. Carrot Sticks  H. Bouillon   M. Unsalted nuts   D. [place] fries  I. Peanut butter crackers N. Salami   E. Sweet pickles J. Tomato Juice   O. Pizza  12.  Seasonings that may be used freely on a reduced - sodium diet include   A. Lemon wedges F.Monosodium glutamate K. Celery seed    B.Soysauce   G. Pepper   L. Mustard powder   C. Sea salt  H. Cooking wine  M. Onion flakes   D. Vinegar  E. Prepared horseradish N. Salsa   E. Sage   J. Worcestershire sauce  O. Chutney  Samples Eliquis 5 mg #14 tabs, lot [id number], exp 8/23

## 2020-06-10 ENCOUNTER — Telehealth: Payer: Self-pay | Admitting: Internal Medicine

## 2020-06-10 MED ORDER — AMOXICILLIN-POT CLAVULANATE 875-125 MG PO TABS: 1.0000 | ORAL_TABLET | Freq: Two times a day (BID) | ORAL | 0 refills | Status: AC

## 2020-06-10 NOTE — Telephone Encounter (Signed)
Called pt and she said that she has been have stomach pain for several days since Sunday.  She said that it hurts on left side and she started having nausea yesterday.  She said that this is the way it has happened in the past with diverticulitis flare ups.  No constipation, diarrhea, rectal bleeding, or vomiting.

## 2020-06-10 NOTE — Addendum Note (Signed)
Addended by: Annitta Needs on: 06/10/2020 10:52 AM   Modules accepted: Orders

## 2020-06-10 NOTE — Telephone Encounter (Signed)
I sent in Augmentin BID for 5 days. She needs to call on Monday with a report. Will need CT scan if no improvement.

## 2020-06-10 NOTE — Telephone Encounter (Signed)
Pt called asking if Daisy Kaufman, NP would call in Augmentin and a antibiotic for her diverticulitis flare up. She uses [facility] in [place]. [phone]

## 2020-06-10 NOTE — Telephone Encounter (Signed)
Called pt and made her aware that Daisy Kaufman, NP sent in RX.  Pt was advised on how to take it.  She was informed to call us on Monday with a report.  She was also informed that if no improvement then she may need CT.  Pt voiced understanding.

## 2020-06-13 ENCOUNTER — Inpatient Hospital Stay: Payer: Medicare Other | Admitting: Oncology

## 2020-06-13 ENCOUNTER — Inpatient Hospital Stay: Payer: Medicare Other

## 2020-06-22 NOTE — Progress Notes (Signed)
[facility]  [phone] [phone]  ID: Daisy Black OB: 1951-02-12  MR#: [id number]  AOZ#:[id number]  Patient Care Team: Renee Rival, NP as PCP - General (Nurse Practitioner) Satira Sark, MD as PCP - Cardiology (Cardiology) Aviva Signs, MD (General Surgery) Rico Junker, RN as Registered Nurse Theodore Demark, RN as Registered Nurse Satira Sark, MD as Consulting Physician (Cardiology) Gala Romney Cristopher Estimable, MD as Consulting Physician (Gastroenterology) Lloyd Huger, MD as Consulting Physician (Hematology and Oncology)  CHIEF COMPLAINT: CLL  INTERVAL HISTORY: Patient returns to clinic today for repeat laboratory work and routine 64-month evaluation.  She does not complain of weakness or fatigue today.  She continues to have chronic intermittent left flank pain.  She otherwise feels well.  She has no neurologic complaints.  She denies any fevers, chills, or night sweats.  She has no chest pain, shortness of breath, cough, or hemoptysis. She denies any nausea, vomiting, constipation, or diarrhea.  She has no urinary complaints.  Patient offers no further specific complaints today.  REVIEW OF SYSTEMS:   Review of Systems  Constitutional: Negative.  Negative for diaphoresis, fever, malaise/fatigue and weight loss.  Respiratory: Negative.  Negative for cough and shortness of breath.   Cardiovascular: Negative.  Negative for chest pain and leg swelling.  Gastrointestinal: Negative.  Negative for abdominal pain, blood in stool, constipation, diarrhea, heartburn, melena, nausea and vomiting.  Genitourinary: Positive for flank pain.  Musculoskeletal: Negative for back pain.  Skin: Negative.  Negative for rash.  Neurological: Negative.  Negative for dizziness, sensory change, focal weakness, weakness and headaches.  Psychiatric/Behavioral: Negative.  The patient is not nervous/anxious.     As per HPI. Otherwise, a complete  review of systems is negative.  PAST MEDICAL HISTORY: Past Medical History:  Diagnosis Date  . Anemia   . Arthritis   . Asthma   . CLL (chronic lymphocytic leukemia) ([place])   . Essential hypertension   . GERD (gastroesophageal reflux disease)   . History of hiatal hernia   . Hyperlipidemia   . Paroxysmal atrial fibrillation (HCC)   . Splenomegaly     PAST SURGICAL HISTORY: Past Surgical History:  Procedure Laterality Date  . ANKLE SURGERY Right    repair fracture  . CARDIOVERSION N/A 03/20/2016   Procedure: CARDIOVERSION;  Surgeon: Satira Sark, MD;  Location: [facility];  Service: Cardiovascular;  Laterality: N/A;  . CARDIOVERSION N/A 05/22/2016   Procedure: CARDIOVERSION;  Surgeon: Satira Sark, MD;  Location: [facility];  Service: Cardiovascular;  Laterality: N/A;  . CHOLECYSTECTOMY    . COLONOSCOPY WITH PROPOFOL N/A 03/27/2018   Internal hemorrhoids, diverticulosis in sigmoid and descending colon, three 4-6 mm polyps at IC valve. tubular adenomas 5 year surveillance  . ESOPHAGOGASTRODUODENOSCOPY (EGD) WITH PROPOFOL N/A 03/27/2018   Moderate Schatzki's ring s/p dilation, small hiatal hernia  . HERNIA REPAIR  2011   Incisional and umbilical utilizing mesh  . MALONEY DILATION N/A 03/27/2018   Procedure: Venia Minks DILATION;  Surgeon: Daneil Dolin, MD;  [facility];  Service: Endoscopy;  Laterality: N/A;  . POLYPECTOMY  03/27/2018   Procedure: POLYPECTOMY;  Surgeon: Daneil Dolin, MD;  [facility];  Service: Endoscopy;;  colon  . ROTATOR CUFF REPAIR Left   . TEE WITHOUT CARDIOVERSION N/A 01/17/2016   Procedure: TRANSESOPHAGEAL ECHOCARDIOGRAM (TEE);  Surgeon: Herminio Commons, MD;  Location: [facility];  Service: Cardiovascular;  Laterality: N/A;  . TEE WITHOUT CARDIOVERSION N/A  02/21/2016   Procedure: TRANSESOPHAGEAL ECHOCARDIOGRAM (TEE) WITH PROPOFOL;  Surgeon: Herminio Commons, MD;  [facility]: [facility];  Service: Cardiovascular;  Laterality: N/A;  . TEE  WITHOUT CARDIOVERSION N/A 03/20/2016   Procedure: TRANSESOPHAGEAL ECHOCARDIOGRAM (TEE) WITH PROPOFOL;  Surgeon: Satira Sark, MD;  [facility]: [facility];  Service: Cardiovascular;  Laterality: N/A;  . TONSILLECTOMY      FAMILY HISTORY Family History  Problem Relation Age of Onset  . Ovarian cancer Mother 24       Secondary to ovarian cancer  . Leukemia Mother   . Stroke Father 68       Brain stem infarction  . Breast cancer Paternal Aunt   . Crohn's disease Son   . Colon cancer Neg Hx        ADVANCED DIRECTIVES:    HEALTH MAINTENANCE: Social History   Tobacco Use  . Smoking status: Former Smoker    Packs/day: 0.25    Years: 2.00    Pack years: 0.50    Types: Cigarettes    Quit date: 08/03/1975    Years since quitting: 44.9  . Smokeless tobacco: Never Used  Substance Use Topics  . Alcohol use: No    Alcohol/week: 0.0 standard drinks  . Drug use: No     Colonoscopy:  PAP:  Bone density:  Lipid panel:  Allergies  Allergen Reactions  . Flagyl [Metronidazole]     Classic side effect of severe nausea, unable to tolerate.     Current Outpatient Medications  Medication Sig Dispense Refill  . acetaminophen (TYLENOL) 650 MG CR tablet Take 650-1,300 mg by mouth daily as needed for pain.     Marland Kitchen apixaban (ELIQUIS) 5 MG TABS tablet Take 1 tablet (5 mg total) by mouth 2 (two) times daily. 28 tablet 0  . cetirizine (ZYRTEC) 10 MG tablet Take 10 mg by mouth daily as needed for allergies.    . Ferrous Gluconate-C-Folic Acid (IRON-C PO) Take 1 tablet by mouth daily.    . flecainide (TAMBOCOR) 50 MG tablet Take 50 mg by mouth 2 (two) times daily.    . fluticasone (FLONASE) 50 MCG/ACT nasal spray Place 1 spray into both nostrils daily as needed for allergies or rhinitis.    . furosemide (LASIX) 20 MG tablet Take 20 mg by mouth as needed.    Marland Kitchen HYDROcodone-acetaminophen (NORCO/VICODIN) 5-325 MG tablet Take 1 tablet by mouth every 6 (six) hours as needed for moderate pain. 60 tablet 0   . hydrocortisone (ANUSOL-HC) 2.5 % rectal cream Place 1 application rectally 2 (two) times daily. 30 g 1  . levothyroxine (SYNTHROID, LEVOTHROID) 125 MCG tablet Take 125 mcg by mouth daily before breakfast.    . Melatonin 5 MG SUBL Place 1 tablet under the tongue as needed (for sleep).    . metoprolol tartrate (LOPRESSOR) 25 MG tablet TAKE ONE TABLET BY MOUTH TWO TIMES A DAY 180 tablet 1  . Multiple Vitamin (MULTIVITAMIN WITH MINERALS) TABS tablet Take 1 tablet by mouth daily.    . pantoprazole (PROTONIX) 40 MG tablet TAKE ONE TABLET BY MOUTH TWO TIMES A DAY BEFORE A MEAL 60 tablet 5  . prochlorperazine (COMPAZINE) 10 MG tablet Take 1 tablet (10 mg total) by mouth every 6 (six) hours as needed for nausea or vomiting. 60 tablet 2  . promethazine (PHENERGAN) 25 MG tablet Take 1 tablet (25 mg total) by mouth every 6 (six) hours as needed for nausea or vomiting. 60 tablet 1   No current facility-administered medications for  this visit.   Facility-Administered Medications Ordered in Other Visits  Medication Dose Route Frequency Provider Last Rate Last Admin  . hydrocortisone cream 1 % 1 application  1 application Topical TID PRN Satira Sark, MD        OBJECTIVE: Vitals:   06/27/20 1053  BP: (!) 151/77  Pulse: 68  Resp: 16  Temp: 98.2 F (36.8 C)  SpO2: 99%     Body mass index is 41.4 kg/m.    ECOG FS:1 - Symptomatic but completely ambulatory  General: Well-developed, well-nourished, no acute distress. Eyes: Pink conjunctiva, anicteric sclera. HEENT: Normocephalic, moist mucous membranes. Lungs: No audible wheezing or coughing. Heart: Regular rate and rhythm. Abdomen: Soft, nontender, no obvious distention. Musculoskeletal: No edema, cyanosis, or clubbing. Neuro: Alert, answering all questions appropriately. Cranial nerves grossly intact. Skin: No rashes or petechiae noted. Psych: Normal affect. Lymphatics: No palpable lymphadenopathy.   LAB RESULTS:  Lab Results   Component Value Date   NA 143 06/27/2020   K 4.0 06/27/2020   CL 107 06/27/2020   CO2 26 06/27/2020   GLUCOSE 89 06/27/2020   BUN 13 06/27/2020   CREATININE 0.58 06/27/2020   CALCIUM 9.8 06/27/2020   PROT 6.4 (L) 06/27/2020   ALBUMIN 4.0 06/27/2020   AST 16 06/27/2020   ALT 13 06/27/2020   ALKPHOS 96 06/27/2020   BILITOT 0.6 06/27/2020   GFRNONAA >60 06/27/2020   GFRAA >60 03/09/2020    Lab Results  Component Value Date   WBC 15.8 (H) 06/27/2020   NEUTROABS 2.6 06/27/2020   HGB 13.6 06/27/2020   HCT 42.6 06/27/2020   MCV 96.2 06/27/2020   PLT 109 (L) 06/27/2020     STUDIES: No results found.  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  PLAN:    1. CLL: Patient completed her second round of weekly single agent Rituxan x4 on August 20, 2019.  CT scan results from March 04, 2020 reviewed independently with essentially stable lymphadenopathy and splenomegaly.  Her white blood cell count has trended up slightly to 15.8.  No intervention is needed at this time.  Return to clinic in 3 months with repeat imaging and further evaluation.  If patient had recurrence of disease, would consider repeating Rituxan or switch to Imbruvica.  2.  Early satiety, indigestion: Patient does not complain of this today.  Likely secondary to splenomegaly.  3.  Atrial fibrillation: Continue monitoring and treatment per cardiology. 4.  Iron deficiency anemia: Resolved.  She last received IV Feraheme in May 2017.  5.  PET positive thyroid lesions: 1.2 cm right lobe nodule also seen on thyroid ultrasound April 06, 2016.  No change on recent CT scan.  Continue follow-up with ENT as indicated. 6.  Pain: Intermittent.  Continue hydrocodone as needed. 7.  Nausea: Patient does not complain of this today.  Continue Phenergan as needed. 8.  Thrombocytopenia: Chronic and unchanged.  Patient's platelet count is 109.  Patient expressed understanding and was in agreement  with this plan. She also understands that She can call clinic at any time with any questions, concerns, or complaints.     Lloyd Huger, MD   06/27/2020 2:38 PM

## 2020-06-27 ENCOUNTER — Encounter: Payer: Self-pay | Admitting: Oncology

## 2020-06-27 ENCOUNTER — Inpatient Hospital Stay: Payer: Medicare Other | Attending: Oncology

## 2020-06-27 ENCOUNTER — Inpatient Hospital Stay (HOSPITAL_BASED_OUTPATIENT_CLINIC_OR_DEPARTMENT_OTHER): Payer: Medicare Other | Admitting: Oncology

## 2020-06-27 ENCOUNTER — Other Ambulatory Visit: Payer: Self-pay

## 2020-06-27 VITALS — BP 151/77 | HR 68 | Temp 98.2°F | Resp 16 | Wt 264.3 lb

## 2020-06-27 DIAGNOSIS — C9110 Chronic lymphocytic leukemia of B-cell type not having achieved remission: Secondary | ICD-10-CM

## 2020-06-27 DIAGNOSIS — I480 Paroxysmal atrial fibrillation: Secondary | ICD-10-CM | POA: Insufficient documentation

## 2020-06-27 DIAGNOSIS — D696 Thrombocytopenia, unspecified: Secondary | ICD-10-CM | POA: Diagnosis not present

## 2020-06-27 DIAGNOSIS — I10 Essential (primary) hypertension: Secondary | ICD-10-CM | POA: Diagnosis not present

## 2020-06-27 DIAGNOSIS — R161 Splenomegaly, not elsewhere classified: Secondary | ICD-10-CM | POA: Diagnosis not present

## 2020-06-27 LAB — CBC WITH DIFFERENTIAL/PLATELET
Abs Immature Granulocytes: 0.03 10*3/uL (ref 0.00–0.07)
Basophils Absolute: 0 10*3/uL (ref 0.0–0.1)
Basophils Relative: 0 %
Eosinophils Absolute: 0.2 10*3/uL (ref 0.0–0.5)
Eosinophils Relative: 1 %
HCT: 42.6 % (ref 36.0–46.0)
Hemoglobin: 13.6 g/dL (ref 12.0–15.0)
Immature Granulocytes: 0 %
Lymphocytes Relative: 67 %
Lymphs Abs: 10.6 10*3/uL — ABNORMAL HIGH (ref 0.7–4.0)
MCH: 30.7 pg (ref 26.0–34.0)
MCHC: 31.9 g/dL (ref 30.0–36.0)
MCV: 96.2 fL (ref 80.0–100.0)
Monocytes Absolute: 2.4 10*3/uL — ABNORMAL HIGH (ref 0.1–1.0)
Monocytes Relative: 15 %
Neutro Abs: 2.6 10*3/uL (ref 1.7–7.7)
Neutrophils Relative %: 17 %
Platelets: 109 10*3/uL — ABNORMAL LOW (ref 150–400)
RBC: 4.43 MIL/uL (ref 3.87–5.11)
RDW: 13.8 % (ref 11.5–15.5)
WBC: 15.8 10*3/uL — ABNORMAL HIGH (ref 4.0–10.5)
nRBC: 0 % (ref 0.0–0.2)

## 2020-06-27 LAB — COMPREHENSIVE METABOLIC PANEL
ALT: 13 U/L (ref 0–44)
AST: 16 U/L (ref 15–41)
Albumin: 4 g/dL (ref 3.5–5.0)
Alkaline Phosphatase: 96 U/L (ref 38–126)
Anion gap: 10 (ref 5–15)
BUN: 13 mg/dL (ref 8–23)
CO2: 26 mmol/L (ref 22–32)
Calcium: 9.8 mg/dL (ref 8.9–10.3)
Chloride: 107 mmol/L (ref 98–111)
Creatinine, Ser: 0.58 mg/dL (ref 0.44–1.00)
GFR, Estimated: >60 mL/min (ref >60)
Glucose, Bld: 89 mg/dL (ref 70–99)
Potassium: 4 mmol/L (ref 3.5–5.1)
Sodium: 143 mmol/L (ref 135–145)
Total Bilirubin: 0.6 mg/dL (ref 0.3–1.2)
Total Protein: 6.4 g/dL — ABNORMAL LOW (ref 6.5–8.1)

## 2020-06-27 NOTE — Progress Notes (Signed)
Patient denies any concerns today.  

## 2020-06-29 ENCOUNTER — Ambulatory Visit (INDEPENDENT_AMBULATORY_CARE_PROVIDER_SITE_OTHER): Payer: Medicare Other

## 2020-06-29 DIAGNOSIS — R600 Localized edema: Secondary | ICD-10-CM

## 2020-06-29 LAB — ECHOCARDIOGRAM COMPLETE
Area-P 1/2: 2.59 cm2
Calc EF: 62.1 %
S' Lateral: 3 cm
Single Plane A2C EF: 61.9 %
Single Plane A4C EF: 60.5 %

## 2020-06-30 ENCOUNTER — Other Ambulatory Visit: Payer: Self-pay | Admitting: *Deleted

## 2020-06-30 DIAGNOSIS — C9110 Chronic lymphocytic leukemia of B-cell type not having achieved remission: Secondary | ICD-10-CM

## 2020-06-30 MED ORDER — PROMETHAZINE HCL 25 MG PO TABS: 25.0000 mg | ORAL_TABLET | Freq: Four times a day (QID) | ORAL | 1 refills | Status: DC | PRN

## 2020-06-30 MED ORDER — HYDROCODONE-ACETAMINOPHEN 5-325 MG PO TABS: 1.0000 | ORAL_TABLET | Freq: Four times a day (QID) | ORAL | 0 refills | Status: DC | PRN

## 2020-07-05 ENCOUNTER — Other Ambulatory Visit: Payer: Self-pay

## 2020-07-05 ENCOUNTER — Other Ambulatory Visit: Payer: Self-pay | Admitting: Cardiology

## 2020-07-05 MED ORDER — FLECAINIDE ACETATE 50 MG PO TABS: 50.0000 mg | ORAL_TABLET | Freq: Two times a day (BID) | ORAL | 1 refills | Status: DC

## 2020-07-05 NOTE — Telephone Encounter (Signed)
Pt verified she takes flecainide 50 mg BID, refilled to pharmacy

## 2020-07-30 HISTORY — PX: TOTAL KNEE ARTHROPLASTY: SHX125

## 2020-08-01 NOTE — Progress Notes (Deleted)
Cardiology Office Note  Date: 08/01/2020   ID: Roqaya, Kennemore 1951/05/02, MRN [id number]  PCP:  Renee Rival, NP  Cardiologist:  Rozann Lesches, MD Electrophysiologist:  None   Chief Complaint: ***  History of Present Illness: Daisy Black is a 70 y.o. female with a history of PAF CHA2DS2-VASc score 3, HTN, lower extremity edema, CLL, HLD, GERD, chronic diastolic CHF.  COVID-19 early October with monoclonal antibody infusion.   Last encounter with Estella Husk PA 05/31/2020.  She was being seen preop for knee replacement surgery.  She subsequently developed cellulitis in her leg and contamination surgery.  She is still having swelling in her left leg and was told she needed to see cardiology before knee surgery.  She was wearing compression hose and stated the swelling in her leg will go down with compression hose.  She was taking Lasix 20 to 40 mg 4 to 5 days/week.  [name] ordered lower extremity venous Dopplers to check for DVT.  Also ordered updated echocardiogram.  Advised patient to take Lasix daily.  If Dopplers were negative and echocardiogram was stable she could proceed with surgery.  At that time her RCRI index score placed.  A perioperative risk of major cardiac event at 0.9%.  A functional capacity and METS was 4.73 according to [facility] activity status index.  [name] mention left lower extremity edema has been chronic for years.  She had good distal pulses.  Cellulitis resolved with antibiotics.  She was continuing Eliquis metoprolol and flecainide for PAF.  Her blood pressure was controlled.  Past Medical History:  Diagnosis Date  . Anemia   . Arthritis   . Asthma   . CLL (chronic lymphocytic leukemia) ([place])   . Essential hypertension   . GERD (gastroesophageal reflux disease)   . History of hiatal hernia   . Hyperlipidemia   . Paroxysmal atrial fibrillation (HCC)   . Splenomegaly     Past Surgical History:  Procedure Laterality Date  . ANKLE  SURGERY Right    repair fracture  . CARDIOVERSION N/A 03/20/2016   Procedure: CARDIOVERSION;  Surgeon: Satira Sark, MD;  [facility];  Service: Cardiovascular;  Laterality: N/A;  . CARDIOVERSION N/A 05/22/2016   Procedure: CARDIOVERSION;  Surgeon: Satira Sark, MD;  Location: [facility];  Service: Cardiovascular;  Laterality: N/A;  . CHOLECYSTECTOMY    . COLONOSCOPY WITH PROPOFOL N/A 03/27/2018   Internal hemorrhoids, diverticulosis in sigmoid and descending colon, three 4-6 mm polyps at IC valve. tubular adenomas 5 year surveillance  . ESOPHAGOGASTRODUODENOSCOPY (EGD) WITH PROPOFOL N/A 03/27/2018   Moderate Schatzki's ring s/p dilation, small hiatal hernia  . HERNIA REPAIR  2011   Incisional and umbilical utilizing mesh  . MALONEY DILATION N/A 03/27/2018   Procedure: Venia Minks DILATION;  Surgeon: Daneil Dolin, MD;  [facility];  Service: Endoscopy;  Laterality: N/A;  . POLYPECTOMY  03/27/2018   Procedure: POLYPECTOMY;  Surgeon: Daneil Dolin, MD;  [facility];  Service: Endoscopy;;  colon  . ROTATOR CUFF REPAIR Left   . TEE WITHOUT CARDIOVERSION N/A 01/17/2016   Procedure: TRANSESOPHAGEAL ECHOCARDIOGRAM (TEE);  Surgeon: Herminio Commons, MD;  [facility];  Service: Cardiovascular;  Laterality: N/A;  . TEE WITHOUT CARDIOVERSION N/A 02/21/2016   Procedure: TRANSESOPHAGEAL ECHOCARDIOGRAM (TEE) WITH PROPOFOL;  Surgeon: Herminio Commons, MD;  [facility];  Service: Cardiovascular;  Laterality: N/A;  . TEE WITHOUT CARDIOVERSION N/A 03/20/2016   Procedure: TRANSESOPHAGEAL  ECHOCARDIOGRAM (TEE) WITH PROPOFOL;  Surgeon: Jonelle Sidle, MD;  Location: [facility];  Service: Cardiovascular;  Laterality: N/A;  . TONSILLECTOMY      Current Outpatient Medications  Medication Sig Dispense Refill  . acetaminophen (TYLENOL) 650 MG CR tablet Take 650-1,300 mg by mouth daily as needed for pain.     Marland Kitchen apixaban (ELIQUIS) 5 MG TABS tablet Take 1 tablet (5 mg  total) by mouth 2 (two) times daily. 28 tablet 0  . cetirizine (ZYRTEC) 10 MG tablet Take 10 mg by mouth daily as needed for allergies.    . Ferrous Gluconate-C-Folic Acid (IRON-C PO) Take 1 tablet by mouth daily.    . flecainide (TAMBOCOR) 50 MG tablet Take 1 tablet (50 mg total) by mouth 2 (two) times daily. 180 tablet 1  . fluticasone (FLONASE) 50 MCG/ACT nasal spray Place 1 spray into both nostrils daily as needed for allergies or rhinitis.    . furosemide (LASIX) 20 MG tablet Take 20 mg by mouth as needed.    Marland Kitchen HYDROcodone-acetaminophen (NORCO/VICODIN) 5-325 MG tablet Take 1 tablet by mouth every 6 (six) hours as needed for moderate pain. 60 tablet 0  . hydrocortisone (ANUSOL-HC) 2.5 % rectal cream Place 1 application rectally 2 (two) times daily. 30 g 1  . levothyroxine (SYNTHROID, LEVOTHROID) 125 MCG tablet Take 125 mcg by mouth daily before breakfast.    . Melatonin 5 MG SUBL Place 1 tablet under the tongue as needed (for sleep).    . metoprolol tartrate (LOPRESSOR) 25 MG tablet TAKE ONE TABLET BY MOUTH TWO TIMES A DAY 180 tablet 1  . Multiple Vitamin (MULTIVITAMIN WITH MINERALS) TABS tablet Take 1 tablet by mouth daily.    . pantoprazole (PROTONIX) 40 MG tablet TAKE ONE TABLET BY MOUTH TWO TIMES A DAY BEFORE A MEAL 60 tablet 5  . prochlorperazine (COMPAZINE) 10 MG tablet Take 1 tablet (10 mg total) by mouth every 6 (six) hours as needed for nausea or vomiting. 60 tablet 2  . promethazine (PHENERGAN) 25 MG tablet Take 1 tablet (25 mg total) by mouth every 6 (six) hours as needed for nausea or vomiting. 60 tablet 1   No current facility-administered medications for this visit.   Facility-Administered Medications Ordered in Other Visits  Medication Dose Route Frequency Provider Last Rate Last Admin  . hydrocortisone cream 1 % 1 application  1 application Topical TID PRN Jonelle Sidle, MD       Allergies:  Flagyl [metronidazole]   Social History: The patient  reports that she quit  smoking about 45 years ago. Her smoking use included cigarettes. She has a 0.50 pack-year smoking history. She has never used smokeless tobacco. She reports that she does not drink alcohol and does not use drugs.   Family History: The patient's family history includes Breast cancer in her paternal aunt; Crohn's disease in her son; Leukemia in her mother; Ovarian cancer (age of onset: 81) in her mother; Stroke (age of onset: 9) in her father.   ROS:  Please see the history of present illness. Otherwise, complete review of systems is positive for {NONE DEFAULTED:18576::"none"}.  All other systems are reviewed and negative.   Physical Exam: VS:  There were no vitals taken for this visit., BMI There is no height or weight on file to calculate BMI.  Wt Readings from Last 3 Encounters:  06/27/20 264 lb 4.8 oz (119.9 kg)  05/31/20 260 lb 8 oz (118.2 kg)  03/29/20 266 lb (120.7 kg)  General: Patient appears comfortable at rest. HEENT: Conjunctiva and lids normal, oropharynx clear with moist mucosa. Neck: Supple, no elevated JVP or carotid bruits, no thyromegaly. Lungs: Clear to auscultation, nonlabored breathing at rest. Cardiac: Regular rate and rhythm, no S3 or significant systolic murmur, no pericardial rub. Abdomen: Soft, nontender, no hepatomegaly, bowel sounds present, no guarding or rebound. Extremities: No pitting edema, distal pulses 2+. Skin: Warm and dry. Musculoskeletal: No kyphosis. Neuropsychiatric: Alert and oriented x3, affect grossly appropriate.  ECG:  {EKG/Telemetry Strips [phone]}  Recent Labwork: 06/27/2020: ALT 13; AST 16; BUN 13; Creatinine, Ser 0.58; Hemoglobin 13.6; Platelets 109; Potassium 4.0; Sodium 143     Component Value Date/Time   CHOL 219 (H) 11/30/2009 1950   TRIG 69 11/30/2009 1950   HDL 56 11/30/2009 1950   CHOLHDL 3.9 Ratio 11/30/2009 1950   VLDL 14 11/30/2009 1950   LDLCALC 149 (H) 11/30/2009 1950    Other Studies Reviewed  Today:   Echocardiogram 06/29/2020 1. Left ventricular ejection fraction, by estimation, is 60 to 65%. The left ventricle has normal function. The left ventricle has no regional wall motion abnormalities. There is mild left ventricular hypertrophy. Left ventricular diastolic parameters are consistent with Grade I diastolic dysfunction (impaired relaxation). 2. Right ventricular systolic function is normal. The right ventricular size is normal. 3. The mitral valve is normal in structure. No evidence of mitral valve regurgitation. No evidence of mitral stenosis. 4. The aortic valve is tricuspid. Aortic valve regurgitation is not visualized. No aortic stenosis is present. 5. The inferior vena cava is normal in size with greater than 50% respiratory variability, suggesting right atrial pressure of 3 mmHg. Comparison(s): Previous Echo showed LV EF 55-60% with Grade I diastolic dysfunction.    Lower extremity venous duplex 06/30/2020  RIGHT: - No evidence of common femoral vein obstruction. LEFT: - No evidence of deep vein thrombosis in the lower extremity. No indirect evidence of obstruction proximal to the inguinal ligament.    Echocardiogram 06/10/2019: 1. Left ventricular ejection fraction, by visual estimation, is 55 to  60%. The left ventricle has normal function. There is no left ventricular  hypertrophy.  2. Left ventricular diastolic parameters are consistent with Grade I  diastolic dysfunction (impaired relaxation).  3. The left ventricle has no regional wall motion abnormalities.  4. Global right ventricle has normal systolic function.The right  ventricular size is normal. No increase in right ventricular wall  thickness.  5. Left atrial size was normal.  6. Right atrial size was normal.  7. Mild mitral annular calcification.  8. The mitral valve is grossly normal. Trace mitral valve regurgitation.  9. The tricuspid valve is grossly normal. Tricuspid valve  regurgitation  is trivial.  10. The aortic valve is tricuspid. Aortic valve regurgitation is not  visualized. No evidence of aortic valve sclerosis or stenosis.  11. The pulmonic valve was not well visualized. Pulmonic valve  regurgitation is not visualized.  12. Normal pulmonary artery systolic pressure.  13. The inferior vena cava is normal in size with greater than 50%  respiratory variability, suggesting right atrial pressure of 3 mmHg.   Assessment and Plan:  1. Preoperative clearance   2. Paroxysmal atrial fibrillation (HCC)   3. Lower extremity edema   4. Essential hypertension    1. Preoperative clearance ***  2. Paroxysmal atrial fibrillation (HCC) ***  3. Lower extremity edema ***  4. Essential hypertension ***  Medication Adjustments/Labs and Tests Ordered: Current medicines are reviewed at length with the patient today.  Concerns regarding medicines are outlined above.   Disposition: Follow-up with ***  Signed, Levell July, NP 08/01/2020 12:56 PM    [place] [place], [place], [place] [address] Phone: [phone]; Fax: [phone]

## 2020-08-02 ENCOUNTER — Telehealth: Payer: Self-pay | Admitting: Physician Assistant

## 2020-08-02 ENCOUNTER — Ambulatory Visit: Payer: Medicare Other | Admitting: Family Medicine

## 2020-08-02 DIAGNOSIS — R600 Localized edema: Secondary | ICD-10-CM

## 2020-08-02 DIAGNOSIS — Z01818 Encounter for other preprocedural examination: Secondary | ICD-10-CM

## 2020-08-02 DIAGNOSIS — I480 Paroxysmal atrial fibrillation: Secondary | ICD-10-CM

## 2020-08-02 DIAGNOSIS — I10 Essential (primary) hypertension: Secondary | ICD-10-CM

## 2020-08-02 NOTE — Telephone Encounter (Signed)
Patient is calling to check on the status of her surgical clearance.

## 2020-08-02 NOTE — Telephone Encounter (Signed)
I s/w the pt to confirm who the surgeon is so that we may fax clearance over for her. Pt states surgeon is Dr. Jacquenette Shone with [facility]. I then called the surgeon's office and confirmed phone # and fax # (Ph# [phone]; fax # [phone]). I will let the pre op know fax # obtained and I will fax ov note stating clearance.

## 2020-08-02 NOTE — Telephone Encounter (Signed)
Contacted patient and she stated that she had talked to someone (She doesn't remember who) and they told her that they had found her surgical clearance and that they would fax it right over to the appropriate facility ([facility]).

## 2020-09-06 ENCOUNTER — Ambulatory Visit: Payer: Medicare Other | Admitting: Cardiology

## 2020-09-09 ENCOUNTER — Telehealth: Payer: Self-pay | Admitting: Cardiology

## 2020-09-09 NOTE — Telephone Encounter (Signed)
   [facility] Pre-operative Risk Assessment    HEARTCARE STAFF: - Please ensure there is not already an duplicate clearance open for this procedure. - Under Visit Info/Reason for Call, type in Other and utilize the format Clearance MM/DD/YY or Clearance TBD. Do not use dashes or single digits. - If request is for dental extraction, please clarify the # of teeth to be extracted.  Request for surgical clearance:  1. What type of surgery is being performed? Total knee replacement left   2. When is this surgery scheduled? 10/18/20  3. What type of clearance is required (medical clearance vs. Pharmacy clearance to hold med vs. Both)? both  4. Are there any medications that need to be held prior to surgery and how long? Blood thinner   5. Practice name and name of physician performing surgery?  Dr Arrie Aran   6. What is the office phone number?  [phone] ex [phone]    7.   What is the office fax number? [phone]  8.   Anesthesia type (None, local, MAC, general) ? Spinal epidural    Jannet Askew 09/09/2020, 3:59 PM  _________________________________________________________________   (provider comments below)

## 2020-09-09 NOTE — Telephone Encounter (Signed)
   Primary Cardiologist: Rozann Lesches, MD  Chart reviewed as part of pre-operative protocol coverage. Patient has upcoming appointment due with Dr. Domenic Polite on 09/29/20 which is well in advance of knee surgery date. Given that recommendations could potentially change after that assessment, we'll hold off on finalizing clearance via this platform. Added "preop clearance" to those appointment notes. Per office protocol, the provider should assess clearance at time of office visit and should forward their finalized clearance decision to requesting party below. I will remove this message from the pre-op box. Will route this update to requesting provider via Epic fax function. Please call with questions.  Will also route to the pharmacy team so info on anticoag is available to Dr. Domenic Polite at time of that appointment. No need to route back to preop pool.  Charlie Pitter, PA-C 09/09/2020, 5:30 PM

## 2020-09-12 NOTE — Telephone Encounter (Signed)
Patient with diagnosis of atrial fibrillation on Eliquis for anticoagulation.    Procedure: total knee replacement - left Date of procedure: 10/18/20   CHA2DS2-VASc Score = 3  This indicates a 3.2% annual risk of stroke. The patient's score is based upon: CHF History: No HTN History: Yes Diabetes History: No Stroke History: No Vascular Disease History: No Age Score: 1 Gender Score: 1   CrCl 178.2 (91.5 with IBW) Platelet count 109  Per office protocol, patient can hold Eliquis for 3 days prior to procedure.    Patient will not need bridging with Lovenox (enoxaparin) around procedure.  For orthopedic procedures please be sure to resume therapeutic (not prophylactic) dosing.

## 2020-09-12 NOTE — Telephone Encounter (Signed)
Daisy Black has an appt to see you 09/29/20 for surgical clearance. I'm forwarding you this note so you have her information for your visit. Richardson Dopp, PA-C    09/12/2020 10:03 PM

## 2020-09-14 ENCOUNTER — Other Ambulatory Visit: Payer: Self-pay | Admitting: Gastroenterology

## 2020-09-26 ENCOUNTER — Ambulatory Visit: Payer: Medicare Other

## 2020-09-28 ENCOUNTER — Ambulatory Visit: Payer: Medicare Other | Admitting: Oncology

## 2020-09-28 ENCOUNTER — Other Ambulatory Visit: Payer: Medicare Other

## 2020-09-29 ENCOUNTER — Encounter: Payer: Self-pay | Admitting: Cardiology

## 2020-09-29 ENCOUNTER — Ambulatory Visit (INDEPENDENT_AMBULATORY_CARE_PROVIDER_SITE_OTHER): Payer: Medicare Other | Admitting: Cardiology

## 2020-09-29 VITALS — BP 136/74 | HR 88 | Ht 67.0 in | Wt 263.0 lb

## 2020-09-29 DIAGNOSIS — I480 Paroxysmal atrial fibrillation: Secondary | ICD-10-CM | POA: Diagnosis not present

## 2020-09-29 DIAGNOSIS — Z01810 Encounter for preprocedural cardiovascular examination: Secondary | ICD-10-CM | POA: Diagnosis not present

## 2020-09-29 MED ORDER — APIXABAN 5 MG PO TABS
5.0000 mg | ORAL_TABLET | Freq: Two times a day (BID) | ORAL | 0 refills | Status: DC
Start: 2020-09-29 — End: 2020-12-05

## 2020-09-29 NOTE — Patient Instructions (Signed)

## 2020-09-29 NOTE — Progress Notes (Signed)
Cardiology Office Note  Date: 09/29/2020   ID: Daisy Black, Daisy Black, MRN [id number]  PCP:  Renee Rival, NP  Cardiologist:  Rozann Lesches, MD Electrophysiologist:  None   Chief Complaint  Patient presents with  . Cardiac follow-up    History of Present Illness: Daisy Black is a 70 y.o. female last seen in November 2021 by Ms. Bonnell Public PA-C.  She presents for a routine visit.  Reports no palpitations or chest discomfort on current medical therapy.  She is scheduled for left total knee replacement on March 22, plan is to hold Eliquis 3 days prior to the procedure.  We went over her medications which are listed below.  She has had no spontaneous bleeding problems on Eliquis.  I personally reviewed her ECG today which shows sinus rhythm in the 80s.  I reviewed her echocardiogram from December 2021, LVEF 60 to 65% with mild diastolic dysfunction, normal RV contraction.  Past Medical History:  Diagnosis Date  . Anemia   . Arthritis   . Asthma   . CLL (chronic lymphocytic leukemia) ([place])   . Essential hypertension   . GERD (gastroesophageal reflux disease)   . History of hiatal hernia   . Hyperlipidemia   . Paroxysmal atrial fibrillation (HCC)   . Splenomegaly     Past Surgical History:  Procedure Laterality Date  . ANKLE SURGERY Right    repair fracture  . CARDIOVERSION N/A 03/20/2016   Procedure: CARDIOVERSION;  Surgeon: Satira Sark, MD;  Location: [facility];  Service: Cardiovascular;  Laterality: N/A;  . CARDIOVERSION N/A 05/22/2016   Procedure: CARDIOVERSION;  Surgeon: Satira Sark, MD;  Location: [facility];  Service: Cardiovascular;  Laterality: N/A;  . CHOLECYSTECTOMY    . COLONOSCOPY WITH PROPOFOL N/A 03/27/2018   Internal hemorrhoids, diverticulosis in sigmoid and descending colon, three 4-6 mm polyps at IC valve. tubular adenomas 5 year surveillance  . ESOPHAGOGASTRODUODENOSCOPY (EGD) WITH PROPOFOL N/A 03/27/2018   Moderate Schatzki's ring  s/p dilation, small hiatal hernia  . HERNIA REPAIR  2011   Incisional and umbilical utilizing mesh  . MALONEY DILATION N/A 03/27/2018   Procedure: Venia Minks DILATION;  Surgeon: Daneil Dolin, MD;  [facility];  Service: Endoscopy;  Laterality: N/A;  . POLYPECTOMY  03/27/2018   Procedure: POLYPECTOMY;  Surgeon: Daneil Dolin, MD;  [facility];  Service: Endoscopy;;  colon  . ROTATOR CUFF REPAIR Left   . TEE WITHOUT CARDIOVERSION N/A 01/17/2016   Procedure: TRANSESOPHAGEAL ECHOCARDIOGRAM (TEE);  Surgeon: Herminio Commons, MD;  [facility]: [facility];  Service: Cardiovascular;  Laterality: N/A;  . TEE WITHOUT CARDIOVERSION N/A 02/21/2016   Procedure: TRANSESOPHAGEAL ECHOCARDIOGRAM (TEE) WITH PROPOFOL;  Surgeon: Herminio Commons, MD;  [facility];  Service: Cardiovascular;  Laterality: N/A;  . TEE WITHOUT CARDIOVERSION N/A 03/20/2016   Procedure: TRANSESOPHAGEAL ECHOCARDIOGRAM (TEE) WITH PROPOFOL;  Surgeon: Satira Sark, MD;  [facility]: [facility];  Service: Cardiovascular;  Laterality: N/A;  . TONSILLECTOMY      Current Outpatient Medications  Medication Sig Dispense Refill  . acetaminophen (TYLENOL) 650 MG CR tablet Take 650-1,300 mg by mouth daily as needed for pain.    . cetirizine (ZYRTEC) 10 MG tablet Take 10 mg by mouth daily as needed for allergies.    . Ferrous Gluconate-C-Folic Acid (IRON-C PO) Take 1 tablet by mouth daily.    . flecainide (TAMBOCOR) 50 MG tablet Take 1 tablet (50 mg total) by mouth 2 (two) times  daily. 180 tablet 1  . fluticasone (FLONASE) 50 MCG/ACT nasal spray Place 1 spray into both nostrils daily as needed for allergies or rhinitis.    . furosemide (LASIX) 20 MG tablet Take 20 mg by mouth as needed.    Marland Kitchen HYDROcodone-acetaminophen (NORCO/VICODIN) 5-325 MG tablet Take 1 tablet by mouth every 6 (six) hours as needed for moderate pain. 60 tablet 0  . hydrocortisone (ANUSOL-HC) 2.5 % rectal cream Place 1 application rectally 2 (two) times  daily. 30 g 1  . levothyroxine (SYNTHROID, LEVOTHROID) 125 MCG tablet Take 125 mcg by mouth daily before breakfast.    . Melatonin 5 MG SUBL Place 1 tablet under the tongue as needed (for sleep).    . metoprolol tartrate (LOPRESSOR) 25 MG tablet TAKE ONE TABLET BY MOUTH TWO TIMES A DAY 180 tablet 1  . Multiple Vitamin (MULTIVITAMIN WITH MINERALS) TABS tablet Take 1 tablet by mouth daily.    . pantoprazole (PROTONIX) 40 MG tablet TAKE ONE TABLET BY MOUTH TWO TIMES A DAY BEFORE A MEAL 60 tablet 5  . prochlorperazine (COMPAZINE) 10 MG tablet Take 1 tablet (10 mg total) by mouth every 6 (six) hours as needed for nausea or vomiting. 60 tablet 2  . promethazine (PHENERGAN) 25 MG tablet Take 1 tablet (25 mg total) by mouth every 6 (six) hours as needed for nausea or vomiting. 60 tablet 1  . apixaban (ELIQUIS) 5 MG TABS tablet Take 1 tablet (5 mg total) by mouth 2 (two) times daily. 42 tablet 0   No current facility-administered medications for this visit.   Facility-Administered Medications Ordered in Other Visits  Medication Dose Route Frequency Provider Last Rate Last Admin  . hydrocortisone cream 1 % 1 application  1 application Topical TID PRN Satira Sark, MD       Allergies:  Flagyl [metronidazole]   ROS: No palpitations or syncope.  Left knee pain with ambulation.  Using a cane.  Physical Exam: VS:  BP 136/74   Pulse 88   Ht 5\' 7"  (1.702 m)   Wt 263 lb (119.3 kg)   SpO2 98%   BMI 41.19 kg/m , BMI Body mass index is 41.19 kg/m.  Wt Readings from Last 3 Encounters:  09/29/20 263 lb (119.3 kg)  06/27/20 264 lb 4.8 oz (119.9 kg)  05/31/20 260 lb 8 oz (118.2 kg)    General: Patient appears comfortable at rest. HEENT: Conjunctiva and lids normal, wearing a mask. Neck: Supple, no elevated JVP or carotid bruits, no thyromegaly. Lungs: Clear to auscultation, nonlabored breathing at rest. Cardiac: Regular rate and rhythm, no S3 or significant systolic murmur, no pericardial  rub. Extremities: No pitting edema.  ECG:  An ECG dated 03/29/2020 was personally reviewed today and demonstrated:  Sinus rhythm with PACs.  Recent Labwork: 06/27/2020: ALT 13; AST 16; BUN 13; Creatinine, Ser 0.58; Hemoglobin 13.6; Platelets 109; Potassium 4.0; Sodium 143     Component Value Date/Time   CHOL 219 (H) 11/30/2009 1950   TRIG 69 11/30/2009 1950   HDL 56 11/30/2009 1950   CHOLHDL 3.9 Ratio 11/30/2009 1950   VLDL 14 11/30/2009 1950   LDLCALC 149 (H) 11/30/2009 1950    Other Studies Reviewed Today:  Echocardiogram 06/29/2020: 1. Left ventricular ejection fraction, by estimation, is 60 to 65%. The  left ventricle has normal function. The left ventricle has no regional  wall motion abnormalities. There is mild left ventricular hypertrophy.  Left ventricular diastolic parameters  are consistent with Grade I diastolic dysfunction (  impaired relaxation).  2. Right ventricular systolic function is normal. The right ventricular  size is normal.  3. The mitral valve is normal in structure. No evidence of mitral valve  regurgitation. No evidence of mitral stenosis.  4. The aortic valve is tricuspid. Aortic valve regurgitation is not  visualized. No aortic stenosis is present.  5. The inferior vena cava is normal in size with greater than 50%  respiratory variability, suggesting right atrial pressure of 3 mmHg.   Comparison(s): Previous Echo showed LV EF 55-60% with Grade I diastolic  dysfunction.   Lower extremity venous Dopplers 06/29/2020: Summary:  RIGHT:  - No evidence of common femoral vein obstruction.    LEFT:  - No evidence of deep vein thrombosis in the lower extremity. No indirect  evidence of obstruction proximal to the inguinal ligament.   Assessment and Plan:  1.  Paroxysmal atrial fibrillation.  CHA2DS2-VASc score is 3.  She is doing well in terms of symptom control and is in sinus rhythm today by ECG.  Continue Eliquis, flecainide, and Lopressor.  I  reviewed her lab work from November 2021.  2.  Preoperative cardiac evaluation in a 70 year old woman with paroxysmal atrial fibrillation, hypertension, and hyperlipidemia.  She is scheduled for left total knee replacement under general anesthesia.  She describes activities exceeding 4 METS and has RCRI cardiac risk calculation of class I, 0.4% chance of major adverse cardiac event.  She will hold Eliquis 3 days prior to surgery as already discussed.  Note being forwarded to orthopedic surgeon.  Medication Adjustments/Labs and Tests Ordered: Current medicines are reviewed at length with the patient today.  Concerns regarding medicines are outlined above.   Tests Ordered: Orders Placed This Encounter  Procedures  . EKG 12-Lead    Medication Changes: Meds ordered this encounter  Medications  . apixaban (ELIQUIS) 5 MG TABS tablet    Sig: Take 1 tablet (5 mg total) by mouth 2 (two) times daily.    Dispense:  42 tablet    Refill:  0    [id number] EXP 10/2022    Disposition:  Follow up 6 months in the [place] office.  Signed, Satira Sark, MD, [facility] 09/29/2020 11:57 AM    [facility] [address], [place], [place] [address] Phone: [phone]; Fax: [phone]

## 2020-10-06 ENCOUNTER — Encounter: Payer: Self-pay | Admitting: Internal Medicine

## 2020-10-11 ENCOUNTER — Ambulatory Visit: Payer: Medicare Other | Admitting: Cardiology

## 2020-12-05 ENCOUNTER — Telehealth: Payer: Self-pay | Admitting: Cardiology

## 2020-12-05 MED ORDER — APIXABAN 5 MG PO TABS
5.0000 mg | ORAL_TABLET | Freq: Two times a day (BID) | ORAL | 3 refills | Status: DC
Start: 2020-12-05 — End: 2021-03-20

## 2020-12-05 NOTE — Telephone Encounter (Signed)
New Message    Patient needs a prescription for Eliquis printed out for her for 1 year , so that she can mail it [place] pharmacy , please call when ready to pick up or mail it to her .

## 2020-12-05 NOTE — Telephone Encounter (Signed)
LM that printed rx ready to pick up for Eliquis

## 2020-12-22 NOTE — Progress Notes (Deleted)
Daisy Black  [phone] [phone]  ID: [name] OB: 1951-03-03  MR#: [id number]  SPQ#:[id number]  Patient Care Team: Renee Rival, NP as PCP - General (Nurse Practitioner) Satira Sark, MD as PCP - Cardiology (Cardiology) Aviva Signs, MD (General Surgery) Rico Junker, RN as Registered Nurse Theodore Demark, RN as Registered Nurse Satira Sark, MD as Consulting Physician (Cardiology) Gala Romney Cristopher Estimable, MD as Consulting Physician (Gastroenterology) Lloyd Huger, MD as Consulting Physician (Hematology and Oncology)  CHIEF COMPLAINT: CLL  INTERVAL HISTORY: Patient returns to clinic today for repeat laboratory work and routine 84-month evaluation.  She does not complain of weakness or fatigue today.  She continues to have chronic intermittent left flank pain.  She otherwise feels well.  She has no neurologic complaints.  She denies any fevers, chills, or night sweats.  She has no chest pain, shortness of breath, cough, or hemoptysis. She denies any nausea, vomiting, constipation, or diarrhea.  She has no urinary complaints.  Patient offers no further specific complaints today.  REVIEW OF SYSTEMS:   Review of Systems  Constitutional: Negative.  Negative for diaphoresis, fever, malaise/fatigue and weight loss.  Respiratory: Negative.  Negative for cough and shortness of breath.   Cardiovascular: Negative.  Negative for chest pain and leg swelling.  Gastrointestinal: Negative.  Negative for abdominal pain, blood in stool, constipation, diarrhea, heartburn, melena, nausea and vomiting.  Genitourinary: Positive for flank pain.  Musculoskeletal: Negative for back pain.  Skin: Negative.  Negative for rash.  Neurological: Negative.  Negative for dizziness, sensory change, focal weakness, weakness and headaches.  Psychiatric/Behavioral: Negative.  The patient is not nervous/anxious.     As per HPI. Otherwise, a complete  review of systems is negative.  PAST MEDICAL HISTORY: Past Medical History:  Diagnosis Date  . Anemia   . Arthritis   . Asthma   . CLL (chronic lymphocytic leukemia) ([place])   . Essential hypertension   . GERD (gastroesophageal reflux disease)   . History of hiatal hernia   . Hyperlipidemia   . Paroxysmal atrial fibrillation (H[facility])   . Splenomegaly     PAST SURGICAL HISTORY: Past Surgical History:  Procedure Laterality Date  . ANKLE SURGERY Right    repair fracture  . CARDIOVERSION N/A 03/20/2016   Procedure: CARDIOVERSION;  Surgeon: Satira Sark, MD;  [facility]: [facility];  Service: Cardiovascular;  Laterality: N/A;  . CARDIOVERSION N/A 05/22/2016   Procedure: CARDIOVERSION;  Surgeon: Satira Sark, MD;  Location: [facility];  Service: Cardiovascular;  Laterality: N/A;  . CHOLECYSTECTOMY    . COLONOSCOPY WITH PROPOFOL N/A 03/27/2018   Internal hemorrhoids, diverticulosis in sigmoid and descending colon, three 4-6 mm polyps at IC valve. tubular adenomas 5 year surveillance  . ESOPHAGOGASTRODUODENOSCOPY (EGD) WITH PROPOFOL N/A 03/27/2018   Moderate Schatzki's ring s/p dilation, small hiatal hernia  . HERNIA REPAIR  2011   Incisional and umbilical utilizing mesh  . MALONEY DILATION N/A 03/27/2018   Procedure: Venia Minks DILATION;  Surgeon: Daneil Dolin, MD;  [facility];  Service: Endoscopy;  Laterality: N/A;  . POLYPECTOMY  03/27/2018   Procedure: POLYPECTOMY;  Surgeon: Daneil Dolin, MD;  [facility];  Service: Endoscopy;;  colon  . ROTATOR CUFF REPAIR Left   . TEE WITHOUT CARDIOVERSION N/A 01/17/2016   Procedure: TRANSESOPHAGEAL ECHOCARDIOGRAM (TEE);  Surgeon: Herminio Commons, MD;  [facility]: [facility];  Service: Cardiovascular;  Laterality: N/A;  . TEE WITHOUT CARDIOVERSION N/A  02/21/2016   Procedure: TRANSESOPHAGEAL ECHOCARDIOGRAM (TEE) WITH PROPOFOL;  Surgeon: Herminio Commons, MD;  [facility]: [facility];  Service: Cardiovascular;  Laterality: N/A;  . TEE  WITHOUT CARDIOVERSION N/A 03/20/2016   Procedure: TRANSESOPHAGEAL ECHOCARDIOGRAM (TEE) WITH PROPOFOL;  Surgeon: Satira Sark, MD;  Location: [facility];  Service: Cardiovascular;  Laterality: N/A;  . TONSILLECTOMY      FAMILY HISTORY Family History  Problem Relation Age of Onset  . Ovarian cancer Mother 37       Secondary to ovarian cancer  . Leukemia Mother   . Stroke Father 71       Brain stem infarction  . Breast cancer Paternal Aunt   . Crohn's disease Son   . Colon cancer Neg Hx        ADVANCED DIRECTIVES:    HEALTH MAINTENANCE: Social History   Tobacco Use  . Smoking status: Former Smoker    Packs/day: 0.25    Years: 2.00    Pack years: 0.50    Types: Cigarettes    Quit date: 08/03/1975    Years since quitting: 45.4  . Smokeless tobacco: Never Used  Substance Use Topics  . Alcohol use: No    Alcohol/week: 0.0 standard drinks  . Drug use: No     Colonoscopy:  PAP:  Bone density:  Lipid panel:  Allergies  Allergen Reactions  . Flagyl [Metronidazole]     Classic side effect of severe nausea, unable to tolerate.     Current Outpatient Medications  Medication Sig Dispense Refill  . acetaminophen (TYLENOL) 650 MG CR tablet Take 650-1,300 mg by mouth daily as needed for pain.    Marland Kitchen apixaban (ELIQUIS) 5 MG TABS tablet Take 1 tablet (5 mg total) by mouth 2 (two) times daily. 180 tablet 3  . cetirizine (ZYRTEC) 10 MG tablet Take 10 mg by mouth daily as needed for allergies.    . Ferrous Gluconate-C-Folic Acid (IRON-C PO) Take 1 tablet by mouth daily.    . flecainide (TAMBOCOR) 50 MG tablet Take 1 tablet (50 mg total) by mouth 2 (two) times daily. 180 tablet 1  . fluticasone (FLONASE) 50 MCG/ACT nasal spray Place 1 spray into both nostrils daily as needed for allergies or rhinitis.    . furosemide (LASIX) 20 MG tablet Take 20 mg by mouth as needed.    Marland Kitchen HYDROcodone-acetaminophen (NORCO/VICODIN) 5-325 MG tablet Take 1 tablet by mouth every 6 (six) hours as needed  for moderate pain. 60 tablet 0  . hydrocortisone (ANUSOL-HC) 2.5 % rectal cream Place 1 application rectally 2 (two) times daily. 30 g 1  . levothyroxine (SYNTHROID, LEVOTHROID) 125 MCG tablet Take 125 mcg by mouth daily before breakfast.    . Melatonin 5 MG SUBL Place 1 tablet under the tongue as needed (for sleep).    . metoprolol tartrate (LOPRESSOR) 25 MG tablet TAKE ONE TABLET BY MOUTH TWO TIMES A DAY 180 tablet 1  . Multiple Vitamin (MULTIVITAMIN WITH MINERALS) TABS tablet Take 1 tablet by mouth daily.    . pantoprazole (PROTONIX) 40 MG tablet TAKE ONE TABLET BY MOUTH TWO TIMES A DAY BEFORE A MEAL 60 tablet 5  . prochlorperazine (COMPAZINE) 10 MG tablet Take 1 tablet (10 mg total) by mouth every 6 (six) hours as needed for nausea or vomiting. 60 tablet 2  . promethazine (PHENERGAN) 25 MG tablet Take 1 tablet (25 mg total) by mouth every 6 (six) hours as needed for nausea or vomiting. 60 tablet 1   No current  facility-administered medications for this visit.   Facility-Administered Medications Ordered in Other Visits  Medication Dose Route Frequency Provider Last Rate Last Admin  . hydrocortisone cream 1 % 1 application  1 application Topical TID PRN Satira Sark, MD        OBJECTIVE: There were no vitals filed for this visit.   There is no height or weight on file to calculate BMI.    ECOG FS:1 - Symptomatic but completely ambulatory  General: Well-developed, well-nourished, no acute distress. Eyes: Pink conjunctiva, anicteric sclera. HEENT: Normocephalic, moist mucous membranes. Lungs: No audible wheezing or coughing. Heart: Regular rate and rhythm. Abdomen: Soft, nontender, no obvious distention. Musculoskeletal: No edema, cyanosis, or clubbing. Neuro: Alert, answering all questions appropriately. Cranial nerves grossly intact. Skin: No rashes or petechiae noted. Psych: Normal affect. Lymphatics: No palpable lymphadenopathy.   LAB RESULTS:  Lab Results  Component  Value Date   NA 143 06/27/2020   K 4.0 06/27/2020   CL 107 06/27/2020   CO2 26 06/27/2020   GLUCOSE 89 06/27/2020   BUN 13 06/27/2020   CREATININE 0.58 06/27/2020   CALCIUM 9.8 06/27/2020   PROT 6.4 (L) 06/27/2020   ALBUMIN 4.0 06/27/2020   AST 16 06/27/2020   ALT 13 06/27/2020   ALKPHOS 96 06/27/2020   BILITOT 0.6 06/27/2020   GFRNONAA >60 06/27/2020   GFRAA >60 03/09/2020    Lab Results  Component Value Date   WBC 15.8 (H) 06/27/2020   NEUTROABS 2.6 06/27/2020   HGB 13.6 06/27/2020   HCT 42.6 06/27/2020   MCV 96.2 06/27/2020   PLT 109 (L) 06/27/2020     STUDIES: No results found.  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  PLAN:    1. CLL: Patient completed her second round of weekly single agent Rituxan x4 on August 20, 2019.  CT scan results from March 04, 2020 reviewed independently with essentially stable lymphadenopathy and splenomegaly.  Her white blood cell count has trended up slightly to 15.8.  No intervention is needed at this time.  Return to clinic in 3 months with repeat imaging and further evaluation.  If patient had recurrence of disease, would consider repeating Rituxan or switch to Imbruvica.  2.  Early satiety, indigestion: Patient does not complain of this today.  Likely secondary to splenomegaly.  3.  Atrial fibrillation: Continue monitoring and treatment per cardiology. 4.  Iron deficiency anemia: Resolved.  She last received IV Feraheme in May 2017.  5.  PET positive thyroid lesions: 1.2 cm right lobe nodule also seen on thyroid ultrasound April 06, 2016.  No change on recent CT scan.  Continue follow-up with ENT as indicated. 6.  Pain: Intermittent.  Continue hydrocodone as needed. 7.  Nausea: Patient does not complain of this today.  Continue Phenergan as needed. 8.  Thrombocytopenia: Chronic and unchanged.  Patient's platelet count is 109.  Patient expressed understanding and was in agreement with this plan.  She also understands that She can call clinic at any time with any questions, concerns, or complaints.     Lloyd Huger, MD   12/22/2020 5:11 PM

## 2020-12-27 ENCOUNTER — Ambulatory Visit: Admission: RE | Admit: 2020-12-27 | Payer: Medicare Other | Source: Ambulatory Visit

## 2020-12-29 ENCOUNTER — Inpatient Hospital Stay: Payer: Medicare Other

## 2020-12-29 ENCOUNTER — Inpatient Hospital Stay: Payer: Medicare Other | Admitting: Oncology

## 2020-12-29 DIAGNOSIS — C9110 Chronic lymphocytic leukemia of B-cell type not having achieved remission: Secondary | ICD-10-CM

## 2021-01-11 ENCOUNTER — Ambulatory Visit
Admission: RE | Admit: 2021-01-11 | Discharge: 2021-01-11 | Disposition: A | Discharge status: Home / Self Care | Payer: Medicare Other | Source: Ambulatory Visit | Attending: Oncology | Admitting: Oncology

## 2021-01-11 ENCOUNTER — Other Ambulatory Visit: Payer: Self-pay | Admitting: Cardiology

## 2021-01-11 ENCOUNTER — Other Ambulatory Visit: Payer: Self-pay

## 2021-01-11 DIAGNOSIS — C9110 Chronic lymphocytic leukemia of B-cell type not having achieved remission: Secondary | ICD-10-CM | POA: Insufficient documentation

## 2021-01-11 LAB — POCT I-STAT CREATININE: Creatinine, Ser: 0.7 mg/dL (ref 0.44–1.00)

## 2021-01-11 MED ORDER — IOHEXOL 300 MG/ML  SOLN
100.0000 mL | Freq: Once | INTRAMUSCULAR | Status: AC | PRN
  Administered 2021-01-11: 11:00:00 100 mL via INTRAVENOUS

## 2021-01-18 ENCOUNTER — Inpatient Hospital Stay: Payer: Medicare Other

## 2021-01-18 ENCOUNTER — Inpatient Hospital Stay: Payer: Medicare Other | Admitting: Oncology

## 2021-01-21 NOTE — Progress Notes (Signed)
Daisy Black  [phone] [phone]  ID: [name] OB: [birth year]  MR#: [id number]  TMA#:[id number]  Patient Care Team: Renee Rival, NP as PCP - General (Nurse Practitioner) Satira Sark, MD as PCP - Cardiology (Cardiology) Aviva Signs, MD (General Surgery) Rico Junker, RN as Registered Nurse Theodore Demark, RN as Registered Nurse Satira Sark, MD as Consulting Physician (Cardiology) Gala Romney Cristopher Estimable, MD as Consulting Physician (Gastroenterology) Lloyd Huger, MD as Consulting Physician (Hematology and Oncology)  CHIEF COMPLAINT: CLL  INTERVAL HISTORY: Patient returns to clinic today for repeat laboratory work and discussion of her imaging results.  Her appointment was delayed several months secondary to left knee replacement.  She is recovering well and feels nearly back to her baseline. She does not complain of weakness or fatigue today.  She continues to have chronic intermittent left flank pain. She has no neurologic complaints.  She denies any fevers, chills, or night sweats.  She has no chest pain, shortness of breath, cough, or hemoptysis. She denies any nausea, vomiting, constipation, or diarrhea.  She has no urinary complaints.  Patient offers no further specific complaints today.  REVIEW OF SYSTEMS:   Review of Systems  Constitutional: Negative.  Negative for diaphoresis, fever, malaise/fatigue and weight loss.  Respiratory: Negative.  Negative for cough and shortness of breath.   Cardiovascular:  Positive for leg swelling. Negative for chest pain.  Gastrointestinal: Negative.  Negative for abdominal pain, blood in stool, constipation, diarrhea, heartburn, melena, nausea and vomiting.  Genitourinary:  Positive for flank pain.  Musculoskeletal:  Positive for joint pain. Negative for back pain.  Skin: Negative.  Negative for rash.  Neurological: Negative.  Negative for dizziness, sensory change, focal  weakness, weakness and headaches.  Psychiatric/Behavioral: Negative.  The patient is not nervous/anxious.    As per HPI. Otherwise, a complete review of systems is negative.  PAST MEDICAL HISTORY: Past Medical History:  Diagnosis Date   Anemia    Arthritis    Asthma    CLL (chronic lymphocytic leukemia) ([place])    Essential hypertension    GERD (gastroesophageal reflux disease)    History of hiatal hernia    Hyperlipidemia    Paroxysmal atrial fibrillation ([facility])    Splenomegaly     PAST SURGICAL HISTORY: Past Surgical History:  Procedure Laterality Date   ANKLE SURGERY Right    repair fracture   CARDIOVERSION N/A 03/20/2016   Procedure: CARDIOVERSION;  Surgeon: Satira Sark, MD;  Location: [facility];  Service: Cardiovascular;  Laterality: N/A;   CARDIOVERSION N/A 05/22/2016   Procedure: CARDIOVERSION;  Surgeon: Satira Sark, MD;  Location: [facility];  Service: Cardiovascular;  Laterality: N/A;   CHOLECYSTECTOMY     COLONOSCOPY WITH PROPOFOL N/A 03/27/2018   Internal hemorrhoids, diverticulosis in sigmoid and descending colon, three 4-6 mm polyps at IC valve. tubular adenomas 5 year surveillance   ESOPHAGOGASTRODUODENOSCOPY (EGD) WITH PROPOFOL N/A 03/27/2018   Moderate Schatzki's ring s/p dilation, small hiatal hernia   HERNIA REPAIR  2011   Incisional and umbilical utilizing mesh   MALONEY DILATION N/A 03/27/2018   Procedure: Venia Minks DILATION;  Surgeon: Daneil Dolin, MD;  Location: [facility];  Service: Endoscopy;  Laterality: N/A;   POLYPECTOMY  03/27/2018   Procedure: POLYPECTOMY;  Surgeon: Daneil Dolin, MD;  Location: [facility];  Service: Endoscopy;;  colon   ROTATOR CUFF REPAIR Left    TEE WITHOUT CARDIOVERSION N/A 01/17/2016   Procedure: TRANSESOPHAGEAL  ECHOCARDIOGRAM (TEE);  Surgeon: Herminio Commons, MD;  Location: [facility];  Service: Cardiovascular;  Laterality: N/A;   TEE WITHOUT CARDIOVERSION N/A 02/21/2016   Procedure: TRANSESOPHAGEAL  ECHOCARDIOGRAM (TEE) WITH PROPOFOL;  Surgeon: Herminio Commons, MD;  Location: [facility];  Service: Cardiovascular;  Laterality: N/A;   TEE WITHOUT CARDIOVERSION N/A 03/20/2016   Procedure: TRANSESOPHAGEAL ECHOCARDIOGRAM (TEE) WITH PROPOFOL;  Surgeon: Satira Sark, MD;  Location: [facility];  Service: Cardiovascular;  Laterality: N/A;   TONSILLECTOMY     TOTAL KNEE ARTHROPLASTY  2022    FAMILY HISTORY Family History  Problem Relation Age of Onset   Ovarian cancer Mother 26       Secondary to ovarian cancer   Leukemia Mother    Stroke Father 74       Brain stem infarction   Breast cancer Paternal Aunt    Crohn's disease Son    Colon cancer Neg Hx        ADVANCED DIRECTIVES:    HEALTH MAINTENANCE: Social History   Tobacco Use   Smoking status: Former    Packs/day: 0.25    Years: 2.00    Pack years: 0.50    Types: Cigarettes    Quit date: 08/03/1975    Years since quitting: 45.5   Smokeless tobacco: Never  Substance Use Topics   Alcohol use: No    Alcohol/week: 0.0 standard drinks   Drug use: No     Colonoscopy:  PAP:  Bone density:  Lipid panel:  Allergies  Allergen Reactions   Flagyl [Metronidazole]     Classic side effect of severe nausea, unable to tolerate.     Current Outpatient Medications  Medication Sig Dispense Refill   acetaminophen (TYLENOL) 650 MG CR tablet Take 650-1,300 mg by mouth daily as needed for pain.     apixaban (ELIQUIS) 5 MG TABS tablet Take 1 tablet (5 mg total) by mouth 2 (two) times daily. 180 tablet 3   cetirizine (ZYRTEC) 10 MG tablet Take 10 mg by mouth daily as needed for allergies.     Ferrous Gluconate-C-Folic Acid (IRON-C PO) Take 1 tablet by mouth daily.     flecainide (TAMBOCOR) 50 MG tablet TAKE 1 TABLET (50 MG TOTAL) BY MOUTH 2 (TWO) TIMES DAILY. 180 tablet 2   fluticasone (FLONASE) 50 MCG/ACT nasal spray Place 1 spray into both nostrils daily as needed for allergies or rhinitis.     furosemide (LASIX) 20 MG tablet TAKE 2  TABLETS (40 MG TOTAL) BY MOUTH DAILY. 180 tablet 2   hydrocortisone (ANUSOL-HC) 2.5 % rectal cream Place 1 application rectally 2 (two) times daily. 30 g 1   levothyroxine (SYNTHROID, LEVOTHROID) 125 MCG tablet Take 125 mcg by mouth daily before breakfast.     Melatonin 5 MG SUBL Place 1 tablet under the tongue as needed (for sleep).     metoprolol tartrate (LOPRESSOR) 25 MG tablet TAKE ONE TABLET BY MOUTH TWO TIMES A DAY 180 tablet 1   Multiple Vitamin (MULTIVITAMIN WITH MINERALS) TABS tablet Take 1 tablet by mouth daily.     pantoprazole (PROTONIX) 40 MG tablet TAKE ONE TABLET BY MOUTH TWO TIMES A DAY BEFORE A MEAL 60 tablet 5   HYDROcodone-acetaminophen (NORCO/VICODIN) 5-325 MG tablet Take 1 tablet by mouth every 6 (six) hours as needed for moderate pain. 60 tablet 0   prochlorperazine (COMPAZINE) 10 MG tablet Take 1 tablet (10 mg total) by mouth every 6 (six) hours as needed for nausea or vomiting. (Patient not taking:  Reported on 01/26/2021) 60 tablet 2   promethazine (PHENERGAN) 25 MG tablet Take 1 tablet (25 mg total) by mouth every 6 (six) hours as needed for nausea or vomiting. 60 tablet 1   No current facility-administered medications for this visit.   Facility-Administered Medications Ordered in Other Visits  Medication Dose Route Frequency Provider Last Rate Last Admin   hydrocortisone cream 1 % 1 application  1 application Topical TID PRN Satira Sark, MD        OBJECTIVE: Vitals:   01/26/21 0957  BP: 120/67  Pulse: 80  Resp: 16  Temp: (!) 97.5 F (36.4 C)     Body mass index is 40.31 kg/m.    ECOG FS:1 - Symptomatic but completely ambulatory  General: Well-developed, well-nourished, no acute distress. Eyes: Pink conjunctiva, anicteric sclera. HEENT: Normocephalic, moist mucous membranes. Lungs: No audible wheezing or coughing. Heart: Regular rate and rhythm. Abdomen: Soft, nontender, no obvious distention. Musculoskeletal: No edema, cyanosis, or clubbing. Neuro:  Alert, answering all questions appropriately. Cranial nerves grossly intact. Skin: No rashes or petechiae noted. Psych: Normal affect.    LAB RESULTS:  Lab Results  Component Value Date   NA 140 01/26/2021   K 3.6 01/26/2021   CL 107 01/26/2021   CO2 27 01/26/2021   GLUCOSE 85 01/26/2021   BUN 18 01/26/2021   CREATININE 0.79 01/26/2021   CALCIUM 9.7 01/26/2021   PROT 6.2 (L) 01/26/2021   ALBUMIN 3.7 01/26/2021   AST 17 01/26/2021   ALT 12 01/26/2021   ALKPHOS 112 01/26/2021   BILITOT 0.8 01/26/2021   GFRNONAA >60 01/26/2021   GFRAA >60 03/09/2020    Lab Results  Component Value Date   WBC 16.0 (H) 01/26/2021   NEUTROABS 2.9 01/26/2021   HGB 12.1 01/26/2021   HCT 40.1 01/26/2021   MCV 94.1 01/26/2021   PLT 140 (L) 01/26/2021     STUDIES: CT SOFT TISSUE NECK W CONTRAST  Result Date: 01/11/2021 CLINICAL DATA:  Hematologic malignancy, surveillance for CLL. EXAM: CT NECK WITH CONTRAST TECHNIQUE: Multidetector CT imaging of the neck was performed using the standard protocol following the bolus administration of intravenous contrast. CONTRAST:  1100mL OMNIPAQUE IOHEXOL 300 MG/ML  SOLN COMPARISON:  03/04/2020 FINDINGS: Pharynx and larynx: No thickening of Waldeyer's ring. Salivary glands: Intraparotid enlarged lymph nodes. No intrinsic salivary mass suggested. Thyroid:  17 mm isthmic nodule which is stable from prior. Lymph nodes: Generalized lymphadenopathy with homogeneous low-grade enhancement. Index measurements again taken at the right jugulodigastric where sizes 20 x 16 mm, unchanged. A left jugular chain node at the level of the glottis measures 12 mm in length, not changed. No new areas of adenopathy. Vascular: Unremarkable Limited intracranial: Negative Visualized orbits: Negative Mastoids and visualized paranasal sinuses: Clear Skeleton: Negative Upper chest: Adenopathy.  The chest is reported separately. IMPRESSION: Stable generalized lymphadenopathy when compared to  August 2021. Electronically Signed   By: Monte Fantasia M.D.   On: 01/11/2021 18:29   CT CHEST ABDOMEN PELVIS W CONTRAST  Result Date: 01/12/2021 CLINICAL DATA:  Hematologic malignancy, surveillance follow up CLL follow up CLL EXAM: CT CHEST, ABDOMEN, AND PELVIS WITH CONTRAST TECHNIQUE: Multidetector CT imaging of the chest, abdomen and pelvis was performed following the standard protocol during bolus administration of intravenous contrast. CONTRAST:  181mL OMNIPAQUE IOHEXOL 300 MG/ML  SOLN COMPARISON:  Multiple priors including most recent CT chest abdomen pelvis March 04, 2020. FINDINGS: CT CHEST FINDINGS Cardiovascular: No thoracic aortic aneurysm. No central pulmonary embolus. Normal size  heart. No significant pericardial effusion/thickening. Mediastinum/Nodes: Multiple thyroid nodules are again seen measuring up to 1.6 cm in the isthmus. Recommend thyroid US. (Ref: J Am Coll Radiol. 2015 Feb;12(2): 143-50). Small scattered bilateral supraclavicular lymph nodes appears stable, for instance on image 6/4. Overall stable size of the bilateral axillary and subpectoral lymphadenopathy. Index lesions are as follows: -Right axillary lymph node measures 11 mm on image 19/4, unchanged. -Lower right axillary lymph node measures 19 mm on image 24/4 previously 18 mm. -Left axillary lymph node measures 12 mm on image 18/4, unchanged. -Right hilar node 11 mm on image 25/4, unchanged. -Subcarinal lymph node measures 9 mm on image 27/4, unchanged. Lungs/Pleura: No suspicious pulmonary nodules or masses. No focal airspace consolidations. No pleural effusion. No pneumothorax. Musculoskeletal: No suspicious chest wall mass. No aggressive lytic or blastic lesion of bone. CT ABDOMEN PELVIS FINDINGS Hepatobiliary: No suspicious hepatic lesion. Gallbladder surgically absent. No suspicious biliary ductal dilation. Pancreas: Within normal limits. Spleen: Stable mild splenomegaly measuring 15.2 cm in maximum craniocaudal dimension.  No splenic lesion. Adrenals/Urinary Tract: Adrenal glands are unremarkable. Kidneys are normal, without renal calculi, solid lesion, or hydronephrosis. Bladder is unremarkable for degree of distension. Stomach/Bowel: Enteric contrast traverses the rectum. Stomach is predominantly decompressed limiting evaluation. No pathologic dilation of small bowel. No suspicious small bowel wall thickening visualized. The appendix and terminal ileum within normal limits. Colonic diverticulosis without findings of acute diverticulitis. No suspicious colonic wall thickening or mass like lesions. Vascular/Lymphatic: No abdominal aortic aneurysm. Stable to minimally increased gastrohepatic ligament, celiac axis and periportal lymph nodes. Index lymph nodes are as follows: -Hepatic duodenal ligament lymph node measures 17 mm on image 58/4, previously 15 mm. -Lymph node between the pancreatic head and the IVC measures 11 mm on image 57/4, unchanged. -Gastrohepatic ligament lymph node measures 11 mm on image 52/4, unchanged. Scattered small retroperitoneal lymph nodes are stable. Stable pelvic adenopathy.  Index lesions are as follows: -Right femoral lymph node measures 15 mm on image 109/4, unchanged. -Right pelvic sidewall lymph node measures 12 mm on image 104/4, unchanged. -Right inguinal lymph node measures 14 mm on image 127/4 previously 13 mm. Reproductive: Uterus and bilateral adnexa are unremarkable. Other: No abdominopelvic ascites. Musculoskeletal: Multilevel degenerative changes spine. No aggressive lytic or blastic lesion of bone. IMPRESSION: 1. Overall stable adenopathy above and below the diaphragm. 2. Stable mild splenomegaly. 3. Stable multinodular thyroid, Recommend thyroid US if not previously performed. (Ref: J Am Coll Radiol. 2015 Feb;12(2): 143-50). 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  PLAN:    1.  CLL: Patient completed her second round of weekly single agent Rituxan x4 on August 20, 2019.  Her most recent imaging results with CT scan on January 12, 2021 reviewed independently and reported as above with stable lymphadenopathy and splenomegaly. No intervention is needed at this time.  Return to clinic in 6 months with laboratory work and further evaluation.  Will reimage in June 2023. If patient had recurrence of disease, would consider repeating Rituxan or switch to Imbruvica.  2.  Early satiety, indigestion: Mild.  Likely secondary to splenomegaly.  3.  Atrial fibrillation: Continue monitoring and treatment per cardiology. 4.  Iron deficiency anemia: Resolved.  She last received IV Feraheme in May 2017.  5.  PET positive thyroid lesions: 1.2 cm right lobe nodule also seen on thyroid ultrasound April 06, 2016.  No  change on recent CT scan.  Continue follow-up with ENT as indicated. 6.  Pain: Intermittent.  Pain is given a refill of her hydrocodone today. 7.  Nausea: Patient does not complain of this today.  Continue Phenergan as needed. 8.  Thrombocytopenia: Improved.  Patient's platelet count is 140 today. 9.  Knee replacement: Continue follow-up with orthopedics and rehab as scheduled.  Patient expressed understanding and was in agreement with this plan. She also understands that She can call clinic at any time with any questions, concerns, or complaints.     Lloyd Huger, MD   01/27/2021 2:42 PM

## 2021-01-26 ENCOUNTER — Inpatient Hospital Stay: Payer: Medicare Other

## 2021-01-26 ENCOUNTER — Other Ambulatory Visit: Payer: Self-pay

## 2021-01-26 ENCOUNTER — Inpatient Hospital Stay: Payer: Medicare Other | Attending: Oncology | Admitting: Oncology

## 2021-01-26 ENCOUNTER — Encounter: Payer: Self-pay | Admitting: Oncology

## 2021-01-26 VITALS — BP 120/67 | HR 80 | Temp 97.5°F | Resp 16 | Wt 257.4 lb

## 2021-01-26 DIAGNOSIS — R590 Localized enlarged lymph nodes: Secondary | ICD-10-CM | POA: Diagnosis not present

## 2021-01-26 DIAGNOSIS — Z8719 Personal history of other diseases of the digestive system: Secondary | ICD-10-CM | POA: Diagnosis not present

## 2021-01-26 DIAGNOSIS — Z79899 Other long term (current) drug therapy: Secondary | ICD-10-CM | POA: Diagnosis not present

## 2021-01-26 DIAGNOSIS — I480 Paroxysmal atrial fibrillation: Secondary | ICD-10-CM | POA: Insufficient documentation

## 2021-01-26 DIAGNOSIS — C9110 Chronic lymphocytic leukemia of B-cell type not having achieved remission: Secondary | ICD-10-CM

## 2021-01-26 DIAGNOSIS — Z823 Family history of stroke: Secondary | ICD-10-CM | POA: Insufficient documentation

## 2021-01-26 DIAGNOSIS — Z803 Family history of malignant neoplasm of breast: Secondary | ICD-10-CM | POA: Diagnosis not present

## 2021-01-26 DIAGNOSIS — D696 Thrombocytopenia, unspecified: Secondary | ICD-10-CM | POA: Insufficient documentation

## 2021-01-26 DIAGNOSIS — Z7901 Long term (current) use of anticoagulants: Secondary | ICD-10-CM | POA: Insufficient documentation

## 2021-01-26 DIAGNOSIS — Z881 Allergy status to other antibiotic agents status: Secondary | ICD-10-CM | POA: Insufficient documentation

## 2021-01-26 DIAGNOSIS — Z8379 Family history of other diseases of the digestive system: Secondary | ICD-10-CM | POA: Diagnosis not present

## 2021-01-26 DIAGNOSIS — R6881 Early satiety: Secondary | ICD-10-CM | POA: Insufficient documentation

## 2021-01-26 DIAGNOSIS — K30 Functional dyspepsia: Secondary | ICD-10-CM | POA: Diagnosis not present

## 2021-01-26 DIAGNOSIS — K5730 Diverticulosis of large intestine without perforation or abscess without bleeding: Secondary | ICD-10-CM | POA: Insufficient documentation

## 2021-01-26 DIAGNOSIS — I10 Essential (primary) hypertension: Secondary | ICD-10-CM | POA: Diagnosis not present

## 2021-01-26 DIAGNOSIS — R110 Nausea: Secondary | ICD-10-CM | POA: Insufficient documentation

## 2021-01-26 DIAGNOSIS — E042 Nontoxic multinodular goiter: Secondary | ICD-10-CM | POA: Insufficient documentation

## 2021-01-26 DIAGNOSIS — Z9049 Acquired absence of other specified parts of digestive tract: Secondary | ICD-10-CM | POA: Diagnosis not present

## 2021-01-26 DIAGNOSIS — Z8041 Family history of malignant neoplasm of ovary: Secondary | ICD-10-CM | POA: Diagnosis not present

## 2021-01-26 DIAGNOSIS — Z87891 Personal history of nicotine dependence: Secondary | ICD-10-CM | POA: Diagnosis not present

## 2021-01-26 DIAGNOSIS — Z806 Family history of leukemia: Secondary | ICD-10-CM | POA: Insufficient documentation

## 2021-01-26 DIAGNOSIS — R109 Unspecified abdominal pain: Secondary | ICD-10-CM | POA: Diagnosis not present

## 2021-01-26 DIAGNOSIS — M7989 Other specified soft tissue disorders: Secondary | ICD-10-CM | POA: Insufficient documentation

## 2021-01-26 DIAGNOSIS — M2550 Pain in unspecified joint: Secondary | ICD-10-CM | POA: Insufficient documentation

## 2021-01-26 LAB — COMPREHENSIVE METABOLIC PANEL
ALT: 12 U/L (ref 0–44)
AST: 17 U/L (ref 15–41)
Albumin: 3.7 g/dL (ref 3.5–5.0)
Alkaline Phosphatase: 112 U/L (ref 38–126)
Anion gap: 6 (ref 5–15)
BUN: 18 mg/dL (ref 8–23)
CO2: 27 mmol/L (ref 22–32)
Calcium: 9.7 mg/dL (ref 8.9–10.3)
Chloride: 107 mmol/L (ref 98–111)
Creatinine, Ser: 0.79 mg/dL (ref 0.44–1.00)
GFR, Estimated: >60 mL/min (ref >60)
Glucose, Bld: 85 mg/dL (ref 70–99)
Potassium: 3.6 mmol/L (ref 3.5–5.1)
Sodium: 140 mmol/L (ref 135–145)
Total Bilirubin: 0.8 mg/dL (ref 0.3–1.2)
Total Protein: 6.2 g/dL — ABNORMAL LOW (ref 6.5–8.1)

## 2021-01-26 LAB — CBC WITH DIFFERENTIAL/PLATELET
Abs Immature Granulocytes: 0.02 10*3/uL (ref 0.00–0.07)
Basophils Absolute: 0.1 10*3/uL (ref 0.0–0.1)
Basophils Relative: 0 %
Eosinophils Absolute: 0.2 10*3/uL (ref 0.0–0.5)
Eosinophils Relative: 1 %
HCT: 40.1 % (ref 36.0–46.0)
Hemoglobin: 12.1 g/dL (ref 12.0–15.0)
Immature Granulocytes: 0 %
Lymphocytes Relative: 71 %
Lymphs Abs: 11.2 10*3/uL — ABNORMAL HIGH (ref 0.7–4.0)
MCH: 28.4 pg (ref 26.0–34.0)
MCHC: 30.2 g/dL (ref 30.0–36.0)
MCV: 94.1 fL (ref 80.0–100.0)
Monocytes Absolute: 1.6 10*3/uL — ABNORMAL HIGH (ref 0.1–1.0)
Monocytes Relative: 10 %
Neutro Abs: 2.9 10*3/uL (ref 1.7–7.7)
Neutrophils Relative %: 18 %
Platelets: 140 10*3/uL — ABNORMAL LOW (ref 150–400)
RBC: 4.26 MIL/uL (ref 3.87–5.11)
RDW: 14 % (ref 11.5–15.5)
Smear Review: NORMAL
WBC: 16 10*3/uL — ABNORMAL HIGH (ref 4.0–10.5)
nRBC: 0.1 % (ref 0.0–0.2)

## 2021-01-26 MED ORDER — HYDROCODONE-ACETAMINOPHEN 5-325 MG PO TABS: 1.0000 | ORAL_TABLET | Freq: Four times a day (QID) | ORAL | 0 refills | Status: DC | PRN

## 2021-01-26 MED ORDER — PROMETHAZINE HCL 25 MG PO TABS: 25.0000 mg | ORAL_TABLET | Freq: Four times a day (QID) | ORAL | 1 refills | Status: DC | PRN

## 2021-01-27 ENCOUNTER — Encounter: Payer: Self-pay | Admitting: Oncology

## 2021-01-31 ENCOUNTER — Other Ambulatory Visit: Payer: Self-pay | Admitting: Cardiology

## 2021-03-17 ENCOUNTER — Telehealth: Payer: Self-pay | Admitting: Cardiology

## 2021-03-17 NOTE — Telephone Encounter (Signed)
New message    Patient needs to pick up prescription for her Eliquis  - she needs it for a years worth.... she mails her prescriptions to [place] , pleaser call her when ready she will come pick it up today.

## 2021-03-17 NOTE — Telephone Encounter (Signed)
Called pt and LMOM.  

## 2021-03-20 ENCOUNTER — Telehealth: Payer: Self-pay | Admitting: Cardiology

## 2021-03-20 MED ORDER — APIXABAN 5 MG PO TABS
5.0000 mg | ORAL_TABLET | Freq: Two times a day (BID) | ORAL | 3 refills | Status: DC
Start: 2021-03-20 — End: 2021-12-01

## 2021-03-20 NOTE — Telephone Encounter (Signed)
New message    Patient needs a written prescription for Eliquis for 1 year so that she can send it to her mail order in [place], she would also like samples if possible.   She wants to pick them up today, patient called Friday but the call was returned to her too late in the day

## 2021-03-20 NOTE — Telephone Encounter (Signed)
Called pt.  Informed Eliquis '5mg'$  prescription is ready for pick up at [facility] office along with 2 boxes of Eliquis samples  Lot TO:[id number]  Exp: 01/2023.  LMOM for pt to come pick up.

## 2021-03-27 ENCOUNTER — Telehealth: Payer: Self-pay | Admitting: Internal Medicine

## 2021-03-27 NOTE — Telephone Encounter (Signed)
Pt called stating that she is having bad reflux. Pt is complaining of abd pain and of her throat burning. Pt states that she has had some nausea, but no vomiting or fever. Pt also states that sometimes when she belches that acid comes back up in her throat. Pt has an appt scheduled with Roseanne Kaufman in January, but is needing something to help with the excessive reflux until then.

## 2021-03-27 NOTE — Telephone Encounter (Signed)
Let's increase Protonix back to BID. May have Pepcid in evenings as needed. No eating/drinking 2-3 hours before laying down. Please send GERD handout. Call if no improvement.

## 2021-03-27 NOTE — Telephone Encounter (Signed)
Please call patient, she is having reflux and she is scheduled for a appointment in January.  Is there something else she can try

## 2021-03-27 NOTE — Telephone Encounter (Signed)
Pt was made aware and verbalized understanding. GERD handout was mailed out to the pt as well.

## 2021-04-05 ENCOUNTER — Encounter: Payer: Self-pay | Admitting: Cardiology

## 2021-04-05 ENCOUNTER — Ambulatory Visit (INDEPENDENT_AMBULATORY_CARE_PROVIDER_SITE_OTHER): Payer: Medicare Other | Admitting: Cardiology

## 2021-04-05 VITALS — BP 130/62 | HR 80 | Ht 67.0 in | Wt 257.0 lb

## 2021-04-05 DIAGNOSIS — Z79899 Other long term (current) drug therapy: Secondary | ICD-10-CM

## 2021-04-05 DIAGNOSIS — I480 Paroxysmal atrial fibrillation: Secondary | ICD-10-CM

## 2021-04-05 DIAGNOSIS — I10 Essential (primary) hypertension: Secondary | ICD-10-CM | POA: Diagnosis not present

## 2021-04-05 MED ORDER — METOPROLOL TARTRATE 50 MG PO TABS: 50.0000 mg | ORAL_TABLET | Freq: Two times a day (BID) | ORAL | 3 refills | Status: DC

## 2021-04-05 NOTE — Patient Instructions (Addendum)
Medication Instructions:  Your physician has recommended you make the following change in your medication:  Increase metoprolol tartrate to 50 mg twice daily Continue other medications the same  Labwork: Flecanide level-have drawn just before your next dose is due [facility]  Testing/Procedures: none  Follow-Up: Your physician recommends that you schedule a follow-up appointment in: 6 months  Any Other Special Instructions Will Be Listed Below (If Applicable).  If you need a refill on your cardiac medications before your next appointment, please call your pharmacy.

## 2021-04-05 NOTE — Progress Notes (Signed)
Cardiology Office Note  Date: 04/05/2021   ID: Jadien, Depaul Jun 14, 1951, MRN [id number]  PCP:  Ludwig Clarks, FNP  Cardiologist:  Rozann Lesches, MD Electrophysiologist:  None   Chief Complaint  Patient presents with   Cardiac follow-up    History of Present Illness: Daisy Black is a 70 y.o. female last seen in March.  She is here for a follow-up visit.  She states that she tolerated interval knee surgery well.  From a cardiac perspective, she has noticed more palpitations since July.  Her resting heart rate is in the 80s today.  We reviewed her medications.  We did discuss increasing Lopressor dose and also checking a flecainide level.  She reports compliance with medications, no bleeding problems on Eliquis.  I reviewed her most recent lab work from June.  Past Medical History:  Diagnosis Date   Anemia    Arthritis    Asthma    CLL (chronic lymphocytic leukemia) ([place])    Essential hypertension    GERD (gastroesophageal reflux disease)    History of hiatal hernia    Hyperlipidemia    Paroxysmal atrial fibrillation (HCC)    Splenomegaly     Past Surgical History:  Procedure Laterality Date   ANKLE SURGERY Right    repair fracture   CARDIOVERSION N/A 03/20/2016   Procedure: CARDIOVERSION;  Surgeon: Satira Sark, MD;  Location: [facility];  Service: Cardiovascular;  Laterality: N/A;   CARDIOVERSION N/A 05/22/2016   Procedure: CARDIOVERSION;  Surgeon: Satira Sark, MD;  Location: [facility];  Service: Cardiovascular;  Laterality: N/A;   CHOLECYSTECTOMY     COLONOSCOPY WITH PROPOFOL N/A 03/27/2018   Internal hemorrhoids, diverticulosis in sigmoid and descending colon, three 4-6 mm polyps at IC valve. tubular adenomas 5 year surveillance   ESOPHAGOGASTRODUODENOSCOPY (EGD) WITH PROPOFOL N/A 03/27/2018   Moderate Schatzki's ring s/p dilation, small hiatal hernia   HERNIA REPAIR  2011   Incisional and umbilical utilizing mesh   MALONEY DILATION N/A  03/27/2018   Procedure: Venia Minks DILATION;  Surgeon: Daneil Dolin, MD;  [facility];  Service: Endoscopy;  Laterality: N/A;   POLYPECTOMY  03/27/2018   Procedure: POLYPECTOMY;  Surgeon: Daneil Dolin, MD;  [facility];  Service: Endoscopy;;  colon   ROTATOR CUFF REPAIR Left    TEE WITHOUT CARDIOVERSION N/A 01/17/2016   Procedure: TRANSESOPHAGEAL ECHOCARDIOGRAM (TEE);  Surgeon: Herminio Commons, MD;  Location: [facility];  Service: Cardiovascular;  Laterality: N/A;   TEE WITHOUT CARDIOVERSION N/A 02/21/2016   Procedure: TRANSESOPHAGEAL ECHOCARDIOGRAM (TEE) WITH PROPOFOL;  Surgeon: Herminio Commons, MD;  Location: [facility];  Service: Cardiovascular;  Laterality: N/A;   TEE WITHOUT CARDIOVERSION N/A 03/20/2016   Procedure: TRANSESOPHAGEAL ECHOCARDIOGRAM (TEE) WITH PROPOFOL;  Surgeon: Satira Sark, MD;  Location: [facility];  Service: Cardiovascular;  Laterality: N/A;   TONSILLECTOMY     TOTAL KNEE ARTHROPLASTY  2022    Current Outpatient Medications  Medication Sig Dispense Refill   acetaminophen (TYLENOL) 650 MG CR tablet Take 650-1,300 mg by mouth daily as needed for pain.     apixaban (ELIQUIS) 5 MG TABS tablet Take 1 tablet (5 mg total) by mouth 2 (two) times daily. 180 tablet 3   cetirizine (ZYRTEC) 10 MG tablet Take 10 mg by mouth daily as needed for allergies.     flecainide (TAMBOCOR) 50 MG tablet TAKE 1 TABLET (50 MG TOTAL) BY MOUTH 2 (TWO) TIMES DAILY. 180 tablet 2  fluticasone (FLONASE) 50 MCG/ACT nasal spray Place 1 spray into both nostrils daily as needed for allergies or rhinitis.     furosemide (LASIX) 20 MG tablet TAKE 2 TABLETS (40 MG TOTAL) BY MOUTH DAILY. 180 tablet 2   HYDROcodone-acetaminophen (NORCO/VICODIN) 5-325 MG tablet Take 1 tablet by mouth every 6 (six) hours as needed for moderate pain. 60 tablet 0   hydrocortisone (ANUSOL-HC) 2.5 % rectal cream Place 1 application rectally 2 (two) times daily. 30 g 1   levothyroxine (SYNTHROID,  LEVOTHROID) 125 MCG tablet Take 125 mcg by mouth daily before breakfast.     Melatonin 5 MG SUBL Place 1 tablet under the tongue as needed (for sleep).     metoprolol tartrate (LOPRESSOR) 50 MG tablet Take 1 tablet (50 mg total) by mouth 2 (two) times daily. 180 tablet 3   Multiple Vitamin (MULTIVITAMIN WITH MINERALS) TABS tablet Take 1 tablet by mouth daily.     pantoprazole (PROTONIX) 40 MG tablet TAKE ONE TABLET BY MOUTH TWO TIMES A DAY BEFORE A MEAL 60 tablet 5   prochlorperazine (COMPAZINE) 10 MG tablet Take 1 tablet (10 mg total) by mouth every 6 (six) hours as needed for nausea or vomiting. 60 tablet 2   promethazine (PHENERGAN) 25 MG tablet Take 1 tablet (25 mg total) by mouth every 6 (six) hours as needed for nausea or vomiting. 60 tablet 1   Ferrous Gluconate-C-Folic Acid (IRON-C PO) Take 1 tablet by mouth daily. (Patient not taking: Reported on 04/05/2021)     No current facility-administered medications for this visit.   Facility-Administered Medications Ordered in Other Visits  Medication Dose Route Frequency Provider Last Rate Last Admin   hydrocortisone cream 1 % 1 application  1 application Topical TID PRN Satira Sark, MD       Allergies:  Flagyl [metronidazole]   ROS: No dizziness or syncope.  Physical Exam: VS:  BP 130/62   Pulse 80   Ht '5\' 7"'$  (1.702 m)   Wt 257 lb (116.6 kg)   SpO2 100%   BMI 40.25 kg/m , BMI Body mass index is 40.25 kg/m.  Wt Readings from Last 3 Encounters:  04/05/21 257 lb (116.6 kg)  01/26/21 257 lb 6.4 oz (116.8 kg)  09/29/20 263 lb (119.3 kg)    General: Patient appears comfortable at rest. HEENT: Conjunctiva and lids normal, wearing a mask. Neck: Supple, no elevated JVP or carotid bruits, no thyromegaly. Lungs: Clear to auscultation, nonlabored breathing at rest. Cardiac: Regular rate and rhythm, no S3 or significant systolic murmur. Extremities: No pitting edema.  ECG:  An ECG dated 09/29/2020 was personally reviewed today and  demonstrated:  Sinus rhythm.  Recent Labwork: 01/26/2021: ALT 12; AST 17; BUN 18; Creatinine, Ser 0.79; Hemoglobin 12.1; Platelets 140; Potassium 3.6; Sodium 140   Other Studies Reviewed Today:  Echocardiogram 06/29/2020:  1. Left ventricular ejection fraction, by estimation, is 60 to 65%. The  left ventricle has normal function. The left ventricle has no regional  wall motion abnormalities. There is mild left ventricular hypertrophy.  Left ventricular diastolic parameters  are consistent with Grade I diastolic dysfunction (impaired relaxation).   2. Right ventricular systolic function is normal. The right ventricular  size is normal.   3. The mitral valve is normal in structure. No evidence of mitral valve  regurgitation. No evidence of mitral stenosis.   4. The aortic valve is tricuspid. Aortic valve regurgitation is not  visualized. No aortic stenosis is present.   5. The inferior  vena cava is normal in size with greater than 50%  respiratory variability, suggesting right atrial pressure of 3 mmHg.   Assessment and Plan:  1.  Paroxysmal atrial fibrillation with CHA2DS2-VASc score of 3.  Continue Eliquis for stroke prophylaxis.  Increase Lopressor to 50 mg twice daily and also check a flecainide level.  She has had more palpitations since July.  Heart rate is regular today.  2.  Essential hypertension, systolic is [id number] today.  No changes to regimen other than up titration of beta-blocker as discussed above.  Medication Adjustments/Labs and Tests Ordered: Current medicines are reviewed at length with the patient today.  Concerns regarding medicines are outlined above.   Tests Ordered: Orders Placed This Encounter  Procedures   Flecainide level     Medication Changes: Meds ordered this encounter  Medications   metoprolol tartrate (LOPRESSOR) 50 MG tablet    Sig: Take 1 tablet (50 mg total) by mouth 2 (two) times daily.    Dispense:  180 tablet    Refill:  3    04/05/2021  dose increase     Disposition:  Follow up  6 months.  Signed, Satira Sark, MD, [facility] 04/05/2021 2:56 PM    [facility] at [place], [place], [place] [address] Phone: [phone]; Fax: [phone]

## 2021-05-12 ENCOUNTER — Telehealth: Payer: Self-pay | Admitting: *Deleted

## 2021-05-12 NOTE — Telephone Encounter (Signed)
-----   Message from Satira Sark, MD sent at 05/09/2021  8:11 AM EDT ----- Results reviewed.  Please check with Daisy Black and sees how she is doing in terms of palpitations, we increased Lopressor at her most recent visit.  Her flecainide level is on the low end of normal range.  If she is still experiencing significant palpitations, would suggest increasing flecainide to 100 mg twice daily.  If she is doing better since we increased Lopressor alone, we could hold the same course for now.  If flecainide dose does get increased, would have her come in for an ECG a week after.

## 2021-05-19 NOTE — Telephone Encounter (Signed)
Patient informed and says her symptoms have improved tremendously since lopressor adjustment. Copy sent to PCP

## 2021-05-29 ENCOUNTER — Telehealth: Payer: Self-pay | Admitting: Cardiology

## 2021-05-29 NOTE — Telephone Encounter (Signed)
  Pt requesting to get a callback from Dr. Myles Gip nurse

## 2021-06-06 ENCOUNTER — Other Ambulatory Visit: Payer: Self-pay | Admitting: Cardiology

## 2021-06-06 ENCOUNTER — Other Ambulatory Visit: Payer: Self-pay

## 2021-06-06 MED ORDER — METOPROLOL TARTRATE 50 MG PO TABS
50.0000 mg | ORAL_TABLET | Freq: Two times a day (BID) | ORAL | 3 refills | Status: DC
Start: 2021-06-06 — End: 2021-11-24

## 2021-06-26 ENCOUNTER — Telehealth: Payer: Self-pay | Admitting: Gastroenterology

## 2021-06-26 NOTE — Telephone Encounter (Signed)
PLEASE CALL PATIENT, SHE HAS AN UPCOMING APPOINTMENT IN January BUT THINKS SHE IS HAVING A DIVERTICULITIS FLAIR UP

## 2021-06-26 NOTE — Telephone Encounter (Signed)
Pt stated that her PCP gave her Augmentin for the flare up and it did not help. Pt was advised by her PCP to contact us or go to ER. Pt was put on cancellation list for a sooner appt.

## 2021-06-28 ENCOUNTER — Emergency Department (HOSPITAL_COMMUNITY)
Admission: EM | Admit: 2021-06-28 | Discharge: 2021-06-28 | Disposition: A | Discharge status: Home / Self Care | Payer: Medicare Other | Attending: Emergency Medicine | Admitting: Emergency Medicine

## 2021-06-28 ENCOUNTER — Encounter (HOSPITAL_COMMUNITY): Payer: Self-pay | Admitting: *Deleted

## 2021-06-28 ENCOUNTER — Emergency Department (HOSPITAL_COMMUNITY): Payer: Medicare Other

## 2021-06-28 DIAGNOSIS — R197 Diarrhea, unspecified: Secondary | ICD-10-CM | POA: Insufficient documentation

## 2021-06-28 DIAGNOSIS — J45909 Unspecified asthma, uncomplicated: Secondary | ICD-10-CM | POA: Diagnosis not present

## 2021-06-28 DIAGNOSIS — R1032 Left lower quadrant pain: Secondary | ICD-10-CM | POA: Diagnosis present

## 2021-06-28 DIAGNOSIS — Z7901 Long term (current) use of anticoagulants: Secondary | ICD-10-CM | POA: Diagnosis not present

## 2021-06-28 DIAGNOSIS — I4891 Unspecified atrial fibrillation: Secondary | ICD-10-CM | POA: Diagnosis not present

## 2021-06-28 DIAGNOSIS — Z79899 Other long term (current) drug therapy: Secondary | ICD-10-CM | POA: Diagnosis not present

## 2021-06-28 DIAGNOSIS — Z87891 Personal history of nicotine dependence: Secondary | ICD-10-CM | POA: Diagnosis not present

## 2021-06-28 DIAGNOSIS — Z20822 Contact with and (suspected) exposure to covid-19: Secondary | ICD-10-CM | POA: Insufficient documentation

## 2021-06-28 DIAGNOSIS — I10 Essential (primary) hypertension: Secondary | ICD-10-CM | POA: Diagnosis not present

## 2021-06-28 DIAGNOSIS — K219 Gastro-esophageal reflux disease without esophagitis: Secondary | ICD-10-CM | POA: Diagnosis not present

## 2021-06-28 LAB — COMPREHENSIVE METABOLIC PANEL
ALT: 15 U/L (ref 0–44)
AST: 20 U/L (ref 15–41)
Albumin: 4.2 g/dL (ref 3.5–5.0)
Alkaline Phosphatase: 115 U/L (ref 38–126)
Anion gap: 4 — ABNORMAL LOW (ref 5–15)
BUN: 9 mg/dL (ref 8–23)
CO2: 26 mmol/L (ref 22–32)
Calcium: 9.9 mg/dL (ref 8.9–10.3)
Chloride: 110 mmol/L (ref 98–111)
Creatinine, Ser: 0.74 mg/dL (ref 0.44–1.00)
GFR, Estimated: >60 mL/min (ref >60)
Glucose, Bld: 93 mg/dL (ref 70–99)
Potassium: 4 mmol/L (ref 3.5–5.1)
Sodium: 140 mmol/L (ref 135–145)
Total Bilirubin: 0.6 mg/dL (ref 0.3–1.2)
Total Protein: 6.3 g/dL — ABNORMAL LOW (ref 6.5–8.1)

## 2021-06-28 LAB — CBC
HCT: 45.1 % (ref 36.0–46.0)
Hemoglobin: 13.5 g/dL (ref 12.0–15.0)
MCH: 28.7 pg (ref 26.0–34.0)
MCHC: 29.9 g/dL — ABNORMAL LOW (ref 30.0–36.0)
MCV: 96 fL (ref 80.0–100.0)
Platelets: 121 10*3/uL — ABNORMAL LOW (ref 150–400)
RBC: 4.7 MIL/uL (ref 3.87–5.11)
RDW: 15.1 % (ref 11.5–15.5)
WBC: 27.5 10*3/uL — ABNORMAL HIGH (ref 4.0–10.5)
nRBC: 0 % (ref 0.0–0.2)

## 2021-06-28 LAB — URINALYSIS, ROUTINE W REFLEX MICROSCOPIC
Bilirubin Urine: NEGATIVE
Glucose, UA: NEGATIVE mg/dL
Hgb urine dipstick: NEGATIVE
Ketones, ur: NEGATIVE mg/dL
Leukocytes,Ua: NEGATIVE
Nitrite: NEGATIVE
Protein, ur: NEGATIVE mg/dL
Specific Gravity, Urine: 1.01 (ref 1.005–1.030)
pH: 8 (ref 5.0–8.0)

## 2021-06-28 LAB — LIPASE, BLOOD: Lipase: 34 U/L (ref 11–51)

## 2021-06-28 LAB — RESP PANEL BY RT-PCR (FLU A&B, COVID) ARPGX2
Influenza A by PCR: NEGATIVE
Influenza B by PCR: NEGATIVE
SARS Coronavirus 2 by RT PCR: NEGATIVE

## 2021-06-28 MED ORDER — HYDROCODONE-ACETAMINOPHEN 5-325 MG PO TABS: 1.0000 | ORAL_TABLET | Freq: Four times a day (QID) | ORAL | 0 refills | Status: DC | PRN

## 2021-06-28 MED ORDER — IOHEXOL 350 MG/ML SOLN
80.0000 mL | Freq: Once | INTRAVENOUS | Status: AC | PRN
  Administered 2021-06-28: 80 mL via INTRAVENOUS

## 2021-06-28 MED ORDER — PIPERACILLIN-TAZOBACTAM 3.375 G IVPB 30 MIN
3.3750 g | Freq: Once | INTRAVENOUS | Status: AC
  Administered 2021-06-28: 3.375 g via INTRAVENOUS
  Filled 2021-06-28: qty 50

## 2021-06-28 MED ORDER — MORPHINE SULFATE (PF) 4 MG/ML IV SOLN
4.0000 mg | Freq: Once | INTRAVENOUS | Status: AC
  Administered 2021-06-28: 4 mg via INTRAVENOUS
  Filled 2021-06-28: qty 1

## 2021-06-28 NOTE — ED Provider Notes (Signed)
CT scan of her abdomen and pelvis had no acute findings.  Patient nontoxic no acute distress.  In conversation with her and her daughter they would like a prescription for hydrocodone.  We will send that to the [facility] at [place] so they can pick it up on their way back towards [place].  Patient will follow-up with her primary care doctor.  And also back with her hematologist oncologist for her chronic lymphocytic leukemia.  Patient's labs reviewed here that were done prior to arrival COVID influenza testing is all negative.   Fredia Sorrow, MD 06/28/21 2117

## 2021-06-28 NOTE — ED Notes (Signed)
An After Visit Summary was printed and given to the patient. Discharge instructions given and no further questions at this time.  Pt leaving with her daughter.

## 2021-06-28 NOTE — ED Notes (Signed)
Pt transported to [facility] ED via [facility]

## 2021-06-28 NOTE — ED Notes (Signed)
Dr. Rogene Houston bedside.

## 2021-06-28 NOTE — ED Provider Notes (Signed)
[facility] EMERGENCY DEPARTMENT Provider Note   CSN: [id number] Arrival date & time: 06/28/21  1029     History Chief Complaint  Patient presents with   Abdominal Pain    Daisy Black is a 70 y.o. female.  HPI Patient with multiple medical issues including diverticulitis in the past, last colonoscopy about 2 years ago presents with lower abdominal pain, left-sided greater than right, sore, persistent, currently 4/10.  Some improvement with Tylenol, symptoms been present for about 2 weeks.  She has seen her physician, completed a course of amoxicillin without change.  She is having some mild diarrhea.  No vomiting, there is anorexia.     Past Medical History:  Diagnosis Date   Anemia    Arthritis    Asthma    CLL (chronic lymphocytic leukemia) ([place])    Essential hypertension    GERD (gastroesophageal reflux disease)    History of hiatal hernia    Hyperlipidemia    Paroxysmal atrial fibrillation (HCC)    Splenomegaly     Patient Active Problem List   Diagnosis Date Noted   Sinus infection 11/11/2019   Left sided abdominal pain 05/01/2019   Carpal tunnel syndrome of left wrist 08/18/2018   Paroxysmal atrial fibrillation ([place]) 01/31/2018   Rectal bleeding 07/19/2017   Globus sensation 09/03/2016   GERD (gastroesophageal reflux disease) 03/09/2016   Splenomegaly 03/09/2016   Abdominal pain, epigastric 03/09/2016   Regurgitation 03/09/2016   CLL (chronic lymphocytic leukemia) ([place]) 01/26/2016   Chest pain 01/12/2016   Atrial fibrillation with RVR ([place]) 01/12/2016   Leukocytosis 01/12/2016   Persistent atrial fibrillation ([place])    Anticoagulant long-term use 10/04/2010   Family hx-arthritis 10/04/2010   TSH elevation 10/04/2010   KNEE, ARTHRITIS, DEGEN./OSTEO 01/25/2010   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 01/25/2010   Hyperlipidemia 11/09/2009   OBESITY 11/09/2009   LYMPHOCYTOSIS 11/01/2009   Essential hypertension 11/01/2009     Past Surgical History:  Procedure Laterality Date   ANKLE SURGERY Right    repair fracture   CARDIOVERSION N/A 03/20/2016   Procedure: CARDIOVERSION;  Surgeon: Satira Sark, MD;  Location: [facility];  Service: Cardiovascular;  Laterality: N/A;   CARDIOVERSION N/A 05/22/2016   Procedure: CARDIOVERSION;  Surgeon: Satira Sark, MD;  Location: [facility];  Service: Cardiovascular;  Laterality: N/A;   CHOLECYSTECTOMY     COLONOSCOPY WITH PROPOFOL N/A 03/27/2018   Internal hemorrhoids, diverticulosis in sigmoid and descending colon, three 4-6 mm polyps at IC valve. tubular adenomas 5 year surveillance   ESOPHAGOGASTRODUODENOSCOPY (EGD) WITH PROPOFOL N/A 03/27/2018   Moderate Schatzki's ring s/p dilation, small hiatal hernia   HERNIA REPAIR  2011   Incisional and umbilical utilizing mesh   MALONEY DILATION N/A 03/27/2018   Procedure: Venia Minks DILATION;  Surgeon: Daneil Dolin, MD;  [facility];  Service: Endoscopy;  Laterality: N/A;   POLYPECTOMY  03/27/2018   Procedure: POLYPECTOMY;  Surgeon: Daneil Dolin, MD;  [facility];  Service: Endoscopy;;  colon   ROTATOR CUFF REPAIR Left    TEE WITHOUT CARDIOVERSION N/A 01/17/2016   Procedure: TRANSESOPHAGEAL ECHOCARDIOGRAM (TEE);  Surgeon: Herminio Commons, MD;  Location: [facility];  Service: Cardiovascular;  Laterality: N/A;   TEE WITHOUT CARDIOVERSION N/A 02/21/2016   Procedure: TRANSESOPHAGEAL ECHOCARDIOGRAM (TEE) WITH PROPOFOL;  Surgeon: Herminio Commons, MD;  Location: [facility];  Service: Cardiovascular;  Laterality: N/A;   TEE WITHOUT CARDIOVERSION N/A 03/20/2016   Procedure: TRANSESOPHAGEAL ECHOCARDIOGRAM (TEE) WITH PROPOFOL;  Surgeon: Satira Sark, MD;  Location: [facility];  Service: Cardiovascular;  Laterality: N/A;   TONSILLECTOMY     TOTAL KNEE ARTHROPLASTY  2022     OB History   No obstetric history on file.     Family History  Problem Relation Age of Onset   Ovarian cancer Mother 43        Secondary to ovarian cancer   Leukemia Mother    Stroke Father 10       Brain stem infarction   Breast cancer Paternal Aunt    Crohn's disease Son    Colon cancer Neg Hx     Social History   Tobacco Use   Smoking status: Former    Packs/day: 0.25    Years: 2.00    Pack years: 0.50    Types: Cigarettes    Quit date: 08/03/1975    Years since quitting: 45.9   Smokeless tobacco: Never  Substance Use Topics   Alcohol use: No    Alcohol/week: 0.0 standard drinks   Drug use: No    Home Medications Prior to Admission medications   Medication Sig Start Date End Date Taking? Authorizing Provider  apixaban (ELIQUIS) 5 MG TABS tablet Take 1 tablet (5 mg total) by mouth 2 (two) times daily. 03/20/21  Yes Satira Sark, MD  cetirizine (ZYRTEC) 10 MG tablet Take 10 mg by mouth daily as needed for allergies.   Yes [provider]  flecainide (TAMBOCOR) 50 MG tablet TAKE 1 TABLET (50 MG TOTAL) BY MOUTH 2 (TWO) TIMES DAILY. 01/12/21  Yes Satira Sark, MD  fluticasone [facility]) 50 MCG/ACT nasal spray Place 1 spray into both nostrils daily as needed for allergies or rhinitis.   Yes [provider]  furosemide (LASIX) 20 MG tablet TAKE 2 TABLETS (40 MG TOTAL) BY MOUTH DAILY. 01/12/21  Yes Satira Sark, MD  HYDROcodone-acetaminophen (NORCO/VICODIN) 5-325 MG tablet Take 1 tablet by mouth every 6 (six) hours as needed for moderate pain. 01/26/21  Yes Lloyd Huger, MD  hydrocortisone (ANUSOL-HC) 2.5 % rectal cream Place 1 application rectally 2 (two) times daily. 11/11/19  Yes Annitta Needs, NP  levothyroxine (SYNTHROID, LEVOTHROID) 125 MCG tablet Take 125 mcg by mouth daily before breakfast.   Yes [provider]  metoprolol tartrate (LOPRESSOR) 50 MG tablet Take 1 tablet (50 mg total) by mouth 2 (two) times daily. 06/06/21  Yes Satira Sark, MD  Multiple Vitamin (MULTIVITAMIN WITH MINERALS) TABS tablet Take 1 tablet by mouth daily.   Yes [provider]  pantoprazole (PROTONIX) 40 MG tablet TAKE ONE TABLET BY MOUTH TWO TIMES A DAY BEFORE A MEAL 09/14/20  Yes Mahala Menghini, PA-C  prochlorperazine (COMPAZINE) 10 MG tablet Take 1 tablet (10 mg total) by mouth every 6 (six) hours as needed for nausea or vomiting. 07/01/19  Yes Lloyd Huger, MD  promethazine (PHENERGAN) 25 MG tablet Take 1 tablet (25 mg total) by mouth every 6 (six) hours as needed for nausea or vomiting. 01/26/21  Yes Lloyd Huger, MD  acetaminophen (TYLENOL) 650 MG CR tablet Take 650-1,300 mg by mouth daily as needed for pain.    [provider]  Ferrous Gluconate-C-Folic Acid (IRON-C PO) Take 1 tablet by mouth daily. Patient not taking: Reported on 04/05/2021    [provider]    Allergies    Flagyl [metronidazole]  Review of Systems   Review of Systems  Constitutional:  Per HPI, otherwise negative  HENT:         Per HPI, otherwise negative  Respiratory:         Per HPI, otherwise negative  Cardiovascular:        Per HPI, otherwise negative  Gastrointestinal:  Positive for abdominal pain, diarrhea and nausea. Negative for vomiting.  Endocrine:       Negative aside from HPI  Genitourinary:        Neg aside from HPI   Musculoskeletal:        Per HPI, otherwise negative  Skin: Negative.   Neurological:  Negative for syncope.   Physical Exam Updated Vital Signs BP 121/69   Pulse 71   Temp 98.5 F (36.9 C) (Oral)   Resp (!) 9   Ht 5\' 7"  (1.702 m)   Wt 110.2 kg   SpO2 100%   BMI 38.06 kg/m   Physical Exam Vitals and nursing note reviewed.  Constitutional:      General: She is not in acute distress.    Appearance: She is well-developed. She is obese.  HENT:     Head: Normocephalic and atraumatic.  Eyes:     Conjunctiva/sclera: Conjunctivae normal.  Cardiovascular:     Rate and Rhythm: Normal rate and regular rhythm.  Pulmonary:     Effort: Pulmonary effort is normal. No respiratory distress.      Breath sounds: Normal breath sounds. No stridor.  Abdominal:     General: There is no distension.     Tenderness: There is abdominal tenderness in the left lower quadrant. There is no guarding or rebound. Negative signs include Murphy's sign and McBurney's sign.  Skin:    General: Skin is warm and dry.  Neurological:     Mental Status: She is alert and oriented to person, place, and time.     Cranial Nerves: No cranial nerve deficit.    ED Results / Procedures / Treatments   Labs (all labs ordered are listed, but only abnormal results are displayed) Labs Reviewed  COMPREHENSIVE METABOLIC PANEL - Abnormal; Notable for the following components:      Result Value   Total Protein 6.3 (*)    Anion gap 4 (*)    All other components within normal limits  CBC - Abnormal; Notable for the following components:   WBC 27.5 (*)    MCHC 29.9 (*)    Platelets 121 (*)    All other components within normal limits  RESP PANEL BY RT-PCR (FLU A&B, COVID) ARPGX2  LIPASE, BLOOD  URINALYSIS, ROUTINE W REFLEX MICROSCOPIC    EKG None  Radiology No results found.  Procedures Procedures   Medications Ordered in ED Medications  piperacillin-tazobactam (ZOSYN) IVPB 3.375 g (0 g Intravenous Stopped 06/28/21 1517)  morphine 4 MG/ML injection 4 mg (4 mg Intravenous Given 06/28/21 1516)    ED Course  I have reviewed the triage vital signs and the nursing notes.  Pertinent labs & imaging results that were available during my care of the patient were reviewed by me and considered in my medical decision making (see chart for details). Labs notable for leukocytosis greater than 20,000.  With concern for diverticulitis patient will receive additional antibiotics.  She has an allergy to Flagyl.    : Patient continues to complain of pain.  With consideration of diverticulitis refractory to oral antibiotics, given her history, additional considerations of perforation versus abscess she will require CT  scan.  That procedures not currently available as machine  at this facility is broken.  She will transfer to our affiliated facility.  I talked with her clinician, Dr. Tomi Bamberger excepting the patient in transfer to the emergency department.  Patient aware of need for transfer.  MDM Rules/Calculators/A&P MDM Number of Diagnoses or Management Options Left lower quadrant abdominal pain: established, worsening   Amount and/or Complexity of Data Reviewed Clinical lab tests: ordered and reviewed Tests in the radiology section of CPT: ordered and reviewed Tests in the medicine section of CPT: reviewed and ordered Decide to obtain previous medical records or to obtain history from someone other than the patient: yes Review and summarize past medical records: yes Discuss the patient with other providers: yes Independent visualization of images, tracings, or specimens: yes  Risk of Complications, Morbidity, and/or Mortality Presenting problems: high Diagnostic procedures: high Management options: high  Critical Care Total time providing critical care: < 30 minutes  Patient Progress Patient progress: stable   Final Clinical Impression(s) / ED Diagnoses Final diagnoses:  Left lower quadrant abdominal pain     Carmin Muskrat, MD 06/28/21 1646

## 2021-06-28 NOTE — ED Notes (Signed)
Aware of need for urine sample, sample cup at bedside, call button within reach

## 2021-06-28 NOTE — ED Notes (Signed)
Spoke to [employer] long Agricultural consultant about pt care and need for pt to transfer for CT. Per Dr. Vanita Panda, Dr.Knapp aware of pt going for CT.

## 2021-06-28 NOTE — Discharge Instructions (Addendum)
CT scan of the abdomen pelvis without any acute changes.  No evidence of any infection or any reason to treat with antibiotics.  Take the hydrocodone as directed for pain.  Pick it up on the way home.  Sent to a 24-hour pharmacy.  Follow-up with your doctors.  To include your primary care doctor regarding the abdominal pain.  And also back up with your hematologist oncologist.

## 2021-06-28 NOTE — ED Triage Notes (Signed)
Abdominal pain x 2 weeks, history of diverticulitis

## 2021-06-28 NOTE — ED Notes (Signed)
This RN miscommunication concerning pt plan for [facility] CT. Called [facility] ED charge RN and clarified that pt will not return after CT, Dr. Tomi Bamberger will continue care

## 2021-06-28 NOTE — ED Notes (Signed)
[facility] called for Transport. 

## 2021-06-28 NOTE — ED Notes (Signed)
Pt presents via EMS for a CT scan to rule out diverticulitis and/or perforation. Pt was at [facility] but was unable to have the CT done there as their scanner is done. Pt initially presented to [facility] for abdominal pain. Pt is alert and oriented.

## 2021-06-29 ENCOUNTER — Telehealth (HOSPITAL_COMMUNITY): Payer: Self-pay | Admitting: Emergency Medicine

## 2021-06-29 MED ORDER — HYDROCODONE-ACETAMINOPHEN 5-325 MG PO TABS: 1.0000 | ORAL_TABLET | ORAL | 0 refills | Status: DC | PRN

## 2021-07-27 NOTE — Progress Notes (Deleted)
[place]  [phone] [phone]  ID: Daisy Black OB: [birth year]  MR#: [id number]  URK#:[id number]  Patient Care Team: Ludwig Clarks, FNP as PCP - General (Family Medicine) Satira Sark, MD as PCP - Cardiology (Cardiology) Aviva Signs, MD (General Surgery) Rico Junker, RN as Registered Nurse Theodore Demark, RN as Registered Nurse Satira Sark, MD as Consulting Physician (Cardiology) Gala Romney Cristopher Estimable, MD as Consulting Physician (Gastroenterology) Lloyd Huger, MD as Consulting Physician (Hematology and Oncology) Ludwig Clarks, FNP (Family Medicine)  CHIEF COMPLAINT: CLL  INTERVAL HISTORY: Patient returns to clinic today for repeat laboratory work and discussion of her imaging results.  Her appointment was delayed several months secondary to left knee replacement.  She is recovering well and feels nearly back to her baseline. She does not complain of weakness or fatigue today.  She continues to have chronic intermittent left flank pain. She has no neurologic complaints.  She denies any fevers, chills, or night sweats.  She has no chest pain, shortness of breath, cough, or hemoptysis. She denies any nausea, vomiting, constipation, or diarrhea.  She has no urinary complaints.  Patient offers no further specific complaints today.  REVIEW OF SYSTEMS:   Review of Systems  Constitutional: Negative.  Negative for diaphoresis, fever, malaise/fatigue and weight loss.  Respiratory: Negative.  Negative for cough and shortness of breath.   Cardiovascular:  Positive for leg swelling. Negative for chest pain.  Gastrointestinal: Negative.  Negative for abdominal pain, blood in stool, constipation, diarrhea, heartburn, melena, nausea and vomiting.  Genitourinary:  Positive for flank pain.  Musculoskeletal:  Positive for joint pain. Negative for back pain.  Skin: Negative.  Negative for rash.  Neurological: Negative.  Negative for  dizziness, sensory change, focal weakness, weakness and headaches.  Psychiatric/Behavioral: Negative.  The patient is not nervous/anxious.    As per HPI. Otherwise, a complete review of systems is negative.  PAST MEDICAL HISTORY: Past Medical History:  Diagnosis Date   Anemia    Arthritis    Asthma    CLL (chronic lymphocytic leukemia) ([place])    Essential hypertension    GERD (gastroesophageal reflux disease)    History of hiatal hernia    Hyperlipidemia    Paroxysmal atrial fibrillation ([facility])    Splenomegaly     PAST SURGICAL HISTORY: Past Surgical History:  Procedure Laterality Date   ANKLE SURGERY Right    repair fracture   CARDIOVERSION N/A 03/20/2016   Procedure: CARDIOVERSION;  Surgeon: Satira Sark, MD;  Location: [facility];  Service: Cardiovascular;  Laterality: N/A;   CARDIOVERSION N/A 05/22/2016   Procedure: CARDIOVERSION;  Surgeon: Satira Sark, MD;  Location: [facility];  Service: Cardiovascular;  Laterality: N/A;   CHOLECYSTECTOMY     COLONOSCOPY WITH PROPOFOL N/A 03/27/2018   Internal hemorrhoids, diverticulosis in sigmoid and descending colon, three 4-6 mm polyps at IC valve. tubular adenomas 5 year surveillance   ESOPHAGOGASTRODUODENOSCOPY (EGD) WITH PROPOFOL N/A 03/27/2018   Moderate Schatzki's ring s/p dilation, small hiatal hernia   HERNIA REPAIR  2011   Incisional and umbilical utilizing mesh   MALONEY DILATION N/A 03/27/2018   Procedure: Venia Minks DILATION;  Surgeon: Daneil Dolin, MD;  [facility];  Service: Endoscopy;  Laterality: N/A;   POLYPECTOMY  03/27/2018   Procedure: POLYPECTOMY;  Surgeon: Daneil Dolin, MD;  [facility];  Service: Endoscopy;;  colon   ROTATOR CUFF REPAIR Left    TEE WITHOUT CARDIOVERSION  N/A 01/17/2016   Procedure: TRANSESOPHAGEAL ECHOCARDIOGRAM (TEE);  Surgeon: Herminio Commons, MD;  Location: [facility];  Service: Cardiovascular;  Laterality: N/A;   TEE WITHOUT CARDIOVERSION N/A 02/21/2016    Procedure: TRANSESOPHAGEAL ECHOCARDIOGRAM (TEE) WITH PROPOFOL;  Surgeon: Herminio Commons, MD;  Location: [facility];  Service: Cardiovascular;  Laterality: N/A;   TEE WITHOUT CARDIOVERSION N/A 03/20/2016   Procedure: TRANSESOPHAGEAL ECHOCARDIOGRAM (TEE) WITH PROPOFOL;  Surgeon: Satira Sark, MD;  Location: [facility];  Service: Cardiovascular;  Laterality: N/A;   TONSILLECTOMY     TOTAL KNEE ARTHROPLASTY  2022    FAMILY HISTORY Family History  Problem Relation Age of Onset   Ovarian cancer Mother 89       Secondary to ovarian cancer   Leukemia Mother    Stroke Father 32       Brain stem infarction   Breast cancer Paternal Aunt    Crohn's disease Son    Colon cancer Neg Hx        ADVANCED DIRECTIVES:    HEALTH MAINTENANCE: Social History   Tobacco Use   Smoking status: Former    Packs/day: 0.25    Years: 2.00    Pack years: 0.50    Types: Cigarettes    Quit date: 08/03/1975    Years since quitting: 46.0   Smokeless tobacco: Never  Substance Use Topics   Alcohol use: No    Alcohol/week: 0.0 standard drinks   Drug use: No     Colonoscopy:  PAP:  Bone density:  Lipid panel:  Allergies  Allergen Reactions   Flagyl [Metronidazole]     Classic side effect of severe nausea, unable to tolerate.     Current Outpatient Medications  Medication Sig Dispense Refill   HYDROcodone-acetaminophen (NORCO/VICODIN) 5-325 MG tablet Take 1 tablet by mouth every 4 (four) hours as needed. 14 tablet 0   acetaminophen (TYLENOL) 650 MG CR tablet Take 650-1,300 mg by mouth daily as needed for pain.     apixaban (ELIQUIS) 5 MG TABS tablet Take 1 tablet (5 mg total) by mouth 2 (two) times daily. 180 tablet 3   cetirizine (ZYRTEC) 10 MG tablet Take 10 mg by mouth daily as needed for allergies.     Ferrous Gluconate-C-Folic Acid (IRON-C PO) Take 1 tablet by mouth daily. (Patient not taking: Reported on 04/05/2021)     flecainide (TAMBOCOR) 50 MG tablet TAKE 1 TABLET (50 MG TOTAL) BY MOUTH 2  (TWO) TIMES DAILY. 180 tablet 2   fluticasone (FLONASE) 50 MCG/ACT nasal spray Place 1 spray into both nostrils daily as needed for allergies or rhinitis.     furosemide (LASIX) 20 MG tablet TAKE 2 TABLETS (40 MG TOTAL) BY MOUTH DAILY. 180 tablet 2   HYDROcodone-acetaminophen (NORCO/VICODIN) 5-325 MG tablet Take 1 tablet by mouth every 6 (six) hours as needed for moderate pain. 60 tablet 0   hydrocortisone (ANUSOL-HC) 2.5 % rectal cream Place 1 application rectally 2 (two) times daily. 30 g 1   levothyroxine (SYNTHROID, LEVOTHROID) 125 MCG tablet Take 125 mcg by mouth daily before breakfast.     metoprolol tartrate (LOPRESSOR) 50 MG tablet Take 1 tablet (50 mg total) by mouth 2 (two) times daily. 180 tablet 3   Multiple Vitamin (MULTIVITAMIN WITH MINERALS) TABS tablet Take 1 tablet by mouth daily.     pantoprazole (PROTONIX) 40 MG tablet TAKE ONE TABLET BY MOUTH TWO TIMES A DAY BEFORE A MEAL 60 tablet 5   prochlorperazine (COMPAZINE) 10 MG tablet Take 1 tablet (  10 mg total) by mouth every 6 (six) hours as needed for nausea or vomiting. 60 tablet 2   promethazine (PHENERGAN) 25 MG tablet Take 1 tablet (25 mg total) by mouth every 6 (six) hours as needed for nausea or vomiting. 60 tablet 1   No current facility-administered medications for this visit.   Facility-Administered Medications Ordered in Other Visits  Medication Dose Route Frequency Provider Last Rate Last Admin   hydrocortisone cream 1 % 1 application  1 application Topical TID PRN Satira Sark, MD        OBJECTIVE: There were no vitals filed for this visit.    There is no height or weight on file to calculate BMI.    ECOG FS:1 - Symptomatic but completely ambulatory  General: Well-developed, well-nourished, no acute distress. Eyes: Pink conjunctiva, anicteric sclera. HEENT: Normocephalic, moist mucous membranes. Lungs: No audible wheezing or coughing. Heart: Regular rate and rhythm. Abdomen: Soft, nontender, no obvious  distention. Musculoskeletal: No edema, cyanosis, or clubbing. Neuro: Alert, answering all questions appropriately. Cranial nerves grossly intact. Skin: No rashes or petechiae noted. Psych: Normal affect.    LAB RESULTS:  Lab Results  Component Value Date   NA 140 06/28/2021   K 4.0 06/28/2021   CL 110 06/28/2021   CO2 26 06/28/2021   GLUCOSE 93 06/28/2021   BUN 9 06/28/2021   CREATININE 0.74 06/28/2021   CALCIUM 9.9 06/28/2021   PROT 6.3 (L) 06/28/2021   ALBUMIN 4.2 06/28/2021   AST 20 06/28/2021   ALT 15 06/28/2021   ALKPHOS 115 06/28/2021   BILITOT 0.6 06/28/2021   GFRNONAA >60 06/28/2021   GFRAA >60 03/09/2020    Lab Results  Component Value Date   WBC 27.5 (H) 06/28/2021   NEUTROABS 2.9 01/26/2021   HGB 13.5 06/28/2021   HCT 45.1 06/28/2021   MCV 96.0 06/28/2021   PLT 121 (L) 06/28/2021     STUDIES: CT Abdomen Pelvis W Contrast  Result Date: 06/28/2021 CLINICAL DATA:  Abdominal infection suspected, left lower quadrant pain. History of CLL. EXAM: CT ABDOMEN AND PELVIS WITH CONTRAST TECHNIQUE: Multidetector CT imaging of the abdomen and pelvis was performed using the standard protocol following bolus administration of intravenous contrast. CONTRAST:  44mL OMNIPAQUE IOHEXOL 350 MG/ML SOLN COMPARISON:  CT chest abdomen and pelvis 01/11/2021. FINDINGS: Lower chest: No acute abnormality. Hepatobiliary: No focal liver abnormality is seen. No gallstones, gallbladder wall thickening, or biliary dilatation. Pancreas: Unremarkable. No pancreatic ductal dilatation or surrounding inflammatory changes. Spleen: Spleen is moderately enlarged, unchanged. Adrenals/Urinary Tract: There are rounded hypodensities in the left kidney which are too small to characterize and unchanged, likely cysts. The kidneys, adrenal glands and bladder are otherwise within normal limits. Stomach/Bowel: Stomach is within normal limits. Appendix appears normal. No evidence of bowel wall thickening,  distention, or inflammatory changes. There is sigmoid colon diverticulosis without evidence for acute diverticulitis. Vascular/Lymphatic: Aorta is normal in size. There are nonenlarged and enlarged gastrohepatic lymph nodes measuring up to 13 mm, unchanged. There is an enlarged portacaval lymph node measuring 1.9 cm short axis, unchanged. Nonenlarged and enlarged retroperitoneal lymph nodes measuring up to 13 mm image 2/31 are unchanged. Enlarged bilateral iliac chain lymph nodes are stable measuring up to 11 mm short axis. Enlarged bilateral inguinal lymph nodes are also unchanged. Reproductive: Uterus and bilateral adnexa are unremarkable. Other: There is a small fat containing umbilical hernia. There is no ascites or free air. Musculoskeletal: No acute or significant osseous findings. IMPRESSION: 1. No acute localizing  process in the abdomen or pelvis. 2. Sigmoid colon diverticulosis without evidence for acute diverticulitis. 3. Stable abdominal, retroperitoneal and pelvic lymphadenopathy. Electronically Signed   By: Ronney Asters M.D.   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  PLAN:    1. CLL: Patient completed her second round of weekly single agent Rituxan x4 on August 20, 2019.  Her most recent imaging results with CT scan on January 12, 2021 reviewed independently and reported as above with stable lymphadenopathy and splenomegaly. No intervention is needed at this time.  Return to clinic in 6 months with laboratory work and further evaluation.  Will reimage in June 2023. If patient had recurrence of disease, would consider repeating Rituxan or switch to Imbruvica.  2.  Early satiety, indigestion: Mild.  Likely secondary to splenomegaly.  3.  Atrial fibrillation: Continue monitoring and treatment per cardiology. 4.  Iron deficiency anemia: Resolved.  She last received IV Feraheme in May 2017.  5.  PET positive thyroid lesions: 1.2 cm right  lobe nodule also seen on thyroid ultrasound April 06, 2016.  No change on recent CT scan.  Continue follow-up with ENT as indicated. 6.  Pain: Intermittent.  Pain is given a refill of her hydrocodone today. 7.  Nausea: Patient does not complain of this today.  Continue Phenergan as needed. 8.  Thrombocytopenia: Improved.  Patient's platelet count is 140 today. 9.  Knee replacement: Continue follow-up with orthopedics and rehab as scheduled.  Patient expressed understanding and was in agreement with this plan. She also understands that She can call clinic at any time with any questions, concerns, or complaints.     Lloyd Huger, MD   07/27/2021 1:06 PM

## 2021-07-28 ENCOUNTER — Telehealth: Payer: Self-pay | Admitting: Oncology

## 2021-07-28 NOTE — Telephone Encounter (Signed)
Pt called to reschedule her appt for 1-4. Wants something in Feb in afternoon. Call back at [phone]

## 2021-08-02 ENCOUNTER — Inpatient Hospital Stay: Payer: Medicare Other | Admitting: Oncology

## 2021-08-02 ENCOUNTER — Inpatient Hospital Stay: Payer: Medicare Other

## 2021-08-10 ENCOUNTER — Encounter: Payer: Self-pay | Admitting: Gastroenterology

## 2021-08-10 ENCOUNTER — Telehealth: Payer: Self-pay

## 2021-08-10 ENCOUNTER — Other Ambulatory Visit: Payer: Self-pay

## 2021-08-10 ENCOUNTER — Ambulatory Visit (INDEPENDENT_AMBULATORY_CARE_PROVIDER_SITE_OTHER): Payer: Medicare Other | Admitting: Gastroenterology

## 2021-08-10 VITALS — BP 136/73 | HR 74 | Temp 96.9°F | Ht 67.0 in | Wt 250.2 lb

## 2021-08-10 DIAGNOSIS — K219 Gastro-esophageal reflux disease without esophagitis: Secondary | ICD-10-CM | POA: Diagnosis not present

## 2021-08-10 DIAGNOSIS — R1310 Dysphagia, unspecified: Secondary | ICD-10-CM | POA: Diagnosis not present

## 2021-08-10 DIAGNOSIS — K648 Other hemorrhoids: Secondary | ICD-10-CM

## 2021-08-10 DIAGNOSIS — R1013 Epigastric pain: Secondary | ICD-10-CM | POA: Diagnosis not present

## 2021-08-10 DIAGNOSIS — K649 Unspecified hemorrhoids: Secondary | ICD-10-CM | POA: Insufficient documentation

## 2021-08-10 MED ORDER — SUCRALFATE 1 GM/10ML PO SUSP: 1.0000 g | Freq: Four times a day (QID) | ORAL | 1 refills | Status: DC

## 2021-08-10 MED ORDER — PANTOPRAZOLE SODIUM 40 MG PO TBEC: DELAYED_RELEASE_TABLET | ORAL | 3 refills | Status: DC

## 2021-08-10 MED ORDER — HYDROCORTISONE (PERIANAL) 2.5 % EX CREA: 1.0000 "application " | TOPICAL_CREAM | Freq: Two times a day (BID) | CUTANEOUS | 1 refills | Status: DC

## 2021-08-10 NOTE — Telephone Encounter (Signed)
Pt is needing refills on her pantoprazole. Pt states that she is taking it twice daily.

## 2021-08-10 NOTE — Patient Instructions (Signed)
We are arranging an upper endoscopy with dilation in the near future.  I have sent in Carafate suspension to take 30 minutes before meals, 4 times a day.   Please have blood work done today.  I have sent in the rectal cream as well to use as needed per rectum.  Further recommendations to follow!  I enjoyed seeing you again today! As you know, I value our relationship and want to provide genuine, compassionate, and quality care. I welcome your feedback. If you receive a survey regarding your visit,  I greatly appreciate you taking time to fill this out. See you next time!  Annitta Needs, PhD, ANP-BC [facility] Gastroenterology

## 2021-08-10 NOTE — Telephone Encounter (Signed)
Completed.

## 2021-08-10 NOTE — Progress Notes (Signed)
Referring Provider: Renee Rival, NP Primary Care Physician:  Ludwig Clarks, FNP Primary GI: Dr. Gala Romney  Chief Complaint  Patient presents with   Abdominal Pain    Llq and back pain, went to ED   Hemorrhoids    HPI:   Daisy Black is a 71 y.o. female presenting today with a history of CLL, followed by [facility]. Colonoscopy and EGD (2019) on file. Colonoscopy surveillance due in 2024. History of chronic GERD   Epigastric pain noted and radiating to the back. Sometimes chest discomfort at rest. Intermittently. Will have nausea intermittently. Back hurts worse with moving around. When taking a shower and bending over, will have discomfort. No postprandial abdominal pain or triggers with food. Sometimes feels tight in esophagus. Solid food dysphagia. No NSAIDs. Pantoprazole BID.   Sees Oncology in February. Sees Cardiology next Monday.    Seen in ED Nov 2022 with CT completed due to LLQ pain. No diverticulitis. LLQ pain resolved. No constipation. Occasional low-volume hematochezia. Requesting cream. Declining rectal exam.   Past Medical History:  Diagnosis Date   Anemia    Arthritis    Asthma    CLL (chronic lymphocytic leukemia) ([place])    Essential hypertension    GERD (gastroesophageal reflux disease)    History of hiatal hernia    Hyperlipidemia    Paroxysmal atrial fibrillation ([facility])    Splenomegaly     Past Surgical History:  Procedure Laterality Date   ANKLE SURGERY Right    repair fracture   CARDIOVERSION N/A 03/20/2016   Procedure: CARDIOVERSION;  Surgeon: Satira Sark, MD;  Location: [facility];  Service: Cardiovascular;  Laterality: N/A;   CARDIOVERSION N/A 05/22/2016   Procedure: CARDIOVERSION;  Surgeon: Satira Sark, MD;  Location: [facility];  Service: Cardiovascular;  Laterality: N/A;   CHOLECYSTECTOMY     COLONOSCOPY WITH PROPOFOL N/A 03/27/2018   Internal hemorrhoids, diverticulosis in sigmoid and descending colon, three 4-6 mm  polyps at IC valve. tubular adenomas 5 year surveillance   ESOPHAGOGASTRODUODENOSCOPY (EGD) WITH PROPOFOL N/A 03/27/2018   Moderate Schatzki's ring s/p dilation, small hiatal hernia   HERNIA REPAIR  2011   Incisional and umbilical utilizing mesh   MALONEY DILATION N/A 03/27/2018   Procedure: Venia Minks DILATION;  Surgeon: Daneil Dolin, MD;  [facility];  Service: Endoscopy;  Laterality: N/A;   POLYPECTOMY  03/27/2018   Procedure: POLYPECTOMY;  Surgeon: Daneil Dolin, MD;  [facility];  Service: Endoscopy;;  colon   ROTATOR CUFF REPAIR Left    TEE WITHOUT CARDIOVERSION N/A 01/17/2016   Procedure: TRANSESOPHAGEAL ECHOCARDIOGRAM (TEE);  Surgeon: Herminio Commons, MD;  Location: [facility];  Service: Cardiovascular;  Laterality: N/A;   TEE WITHOUT CARDIOVERSION N/A 02/21/2016   Procedure: TRANSESOPHAGEAL ECHOCARDIOGRAM (TEE) WITH PROPOFOL;  Surgeon: Herminio Commons, MD;  Location: [facility];  Service: Cardiovascular;  Laterality: N/A;   TEE WITHOUT CARDIOVERSION N/A 03/20/2016   Procedure: TRANSESOPHAGEAL ECHOCARDIOGRAM (TEE) WITH PROPOFOL;  Surgeon: Satira Sark, MD;  Location: [facility];  Service: Cardiovascular;  Laterality: N/A;   TONSILLECTOMY     TOTAL KNEE ARTHROPLASTY  2022    Current Outpatient Medications  Medication Sig Dispense Refill   acetaminophen (TYLENOL) 650 MG CR tablet Take 650-1,300 mg by mouth daily as needed for pain.     apixaban (ELIQUIS) 5 MG TABS tablet Take 1 tablet (5 mg total) by mouth 2 (two) times daily. 180 tablet 3   cetirizine (ZYRTEC)  10 MG tablet Take 10 mg by mouth daily as needed for allergies.     Ferrous Gluconate-C-Folic Acid (IRON-C PO) Take 1 tablet by mouth daily.     flecainide (TAMBOCOR) 50 MG tablet TAKE 1 TABLET (50 MG TOTAL) BY MOUTH 2 (TWO) TIMES DAILY. 180 tablet 2   fluticasone (FLONASE) 50 MCG/ACT nasal spray Place 1 spray into both nostrils daily as needed for allergies or rhinitis.     furosemide (LASIX) 20 MG  tablet TAKE 2 TABLETS (40 MG TOTAL) BY MOUTH DAILY. (Patient taking differently: Take 40 mg by mouth as needed.) 180 tablet 2   HYDROcodone-acetaminophen (NORCO/VICODIN) 5-325 MG tablet Take 1 tablet by mouth every 6 (six) hours as needed for moderate pain. 60 tablet 0   HYDROcodone-acetaminophen (NORCO/VICODIN) 5-325 MG tablet Take 1 tablet by mouth every 4 (four) hours as needed. 14 tablet 0   hydrocortisone (ANUSOL-HC) 2.5 % rectal cream Place 1 application rectally 2 (two) times daily. 30 g 1   levothyroxine (SYNTHROID, LEVOTHROID) 125 MCG tablet Take 125 mcg by mouth daily before breakfast.     metoprolol tartrate (LOPRESSOR) 50 MG tablet Take 1 tablet (50 mg total) by mouth 2 (two) times daily. 180 tablet 3   Multiple Vitamin (MULTIVITAMIN WITH MINERALS) TABS tablet Take 1 tablet by mouth daily.     pantoprazole (PROTONIX) 40 MG tablet TAKE ONE TABLET BY MOUTH TWO TIMES A DAY BEFORE A MEAL 60 tablet 5   promethazine (PHENERGAN) 25 MG tablet Take 1 tablet (25 mg total) by mouth every 6 (six) hours as needed for nausea or vomiting. 60 tablet 1   prochlorperazine (COMPAZINE) 10 MG tablet Take 1 tablet (10 mg total) by mouth every 6 (six) hours as needed for nausea or vomiting. (Patient not taking: Reported on 08/10/2021) 60 tablet 2   No current facility-administered medications for this visit.   Facility-Administered Medications Ordered in Other Visits  Medication Dose Route Frequency Provider Last Rate Last Admin   hydrocortisone cream 1 % 1 application  1 application Topical TID PRN Satira Sark, MD        Allergies as of 08/10/2021 - Review Complete 08/10/2021  Allergen Reaction Noted   Flagyl [metronidazole]  05/01/2019    Family History  Problem Relation Age of Onset   Ovarian cancer Mother 77       Secondary to ovarian cancer   Leukemia Mother    Stroke Father 8       Brain stem infarction   Breast cancer Paternal Aunt    Crohn's disease Son    Colon cancer Neg Hx      Social History   Socioeconomic History   Marital status: Married    Spouse name: Not on file   Number of children: 9   Years of education: Not on file   Highest education level: Not on file  Occupational History   Not on file  Tobacco Use   Smoking status: Former    Packs/day: 0.25    Years: 2.00    Pack years: 0.50    Types: Cigarettes    Quit date: 08/03/1975    Years since quitting: 46.0   Smokeless tobacco: Never  Substance and Sexual Activity   Alcohol use: No    Alcohol/week: 0.0 standard drinks   Drug use: No   Sexual activity: Yes    Birth control/protection: Post-menopausal  Other Topics Concern   Not on file  Social History Narrative   Not on file  Social Determinants of Health   Financial Resource Strain: Not on file  Food Insecurity: Not on file  Transportation Needs: Not on file  Physical Activity: Not on file  Stress: Not on file  Social Connections: Not on file    Review of Systems: Gen: Denies fever, chills, anorexia. Denies fatigue, weakness, weight loss.  CV: Denies chest pain, palpitations, syncope, peripheral edema, and claudication. Resp: Denies dyspnea at rest, cough, wheezing, coughing up blood, and pleurisy. GI: see HPI Derm: Denies rash, itching, dry skin Psych: Denies depression, anxiety, memory loss, confusion. No homicidal or suicidal ideation.  Heme: see HPI  Physical Exam: BP 136/73    Pulse 74    Temp (!) 96.9 F (36.1 C) (Temporal)    Ht 5\' 7"  (1.702 m)    Wt 250 lb 3.2 oz (113.5 kg)    BMI 39.19 kg/m  General:   Alert and oriented. No distress noted. Pleasant and cooperative.  Head:  Normocephalic and atraumatic. Eyes:  Conjuctiva clear without scleral icterus. Mouth:  mask in place Abdomen:  +BS, soft, mild TTP epigastric and non-distended. No rebound or guarding. Splenomegaly Msk:  Symmetrical without gross deformities. Normal posture. Extremities:  Without edema. Neurologic:  Alert and  oriented x4 Psych:  Alert and  cooperative. Normal mood and affect.  ASSESSMENT: SHIKARA MCAULIFFE is a 71 y.o. female presenting today with a history of CLL, followed by [facility]. Colonoscopy and EGD (2019) on file. Colonoscopy surveillance due in 2024. History of chronic GERD and abdominal pain.   GERD: controlled on pantoprazole BID.  Epigastric pain: noting radiating to the back at times. Reporting chest discomfort at rest intermittently. Intermittent nausea. Reports worsening of pain with movement at times. CT on file from Nov 2022. Known chronic splenomegaly could be playing a role. Will pursue EGD in near future. She is also reporting solid food dysphagia, so we can pursue dilation at that time. I have asked she keep upcoming appt with Cardiology due to intermittent chest discomfort at rest. Need to make sure she is stable prior to elective EGD/dilation.     PLAN:  Continue Protonix BID Add Carafate QID Keep upcoming appt with Cardiology; once ensure all is stable, will arrange EGD/dilation Anusol prn rectum Further recommendations to follow. Will need to hold eliquis 48 hours prior.   Annitta Needs, PhD, ANP-BC [facility] Gastroenterology

## 2021-08-11 ENCOUNTER — Encounter: Payer: Self-pay | Admitting: Oncology

## 2021-08-11 LAB — COMPLETE METABOLIC PANEL WITH GFR
AG Ratio: 2.2 (calc) (ref 1.0–2.5)
ALT: 8 U/L (ref 6–29)
AST: 15 U/L (ref 10–35)
Albumin: 4.1 g/dL (ref 3.6–5.1)
Alkaline phosphatase (APISO): 105 U/L (ref 37–153)
BUN: 13 mg/dL (ref 7–25)
CO2: 25 mmol/L (ref 20–32)
Calcium: 10.3 mg/dL (ref 8.6–10.4)
Chloride: 107 mmol/L (ref 98–110)
Creat: 0.85 mg/dL (ref 0.60–1.00)
Globulin: 1.9 g/dL (calc) (ref 1.9–3.7)
Glucose, Bld: 83 mg/dL (ref 65–99)
Potassium: 4.2 mmol/L (ref 3.5–5.3)
Sodium: 143 mmol/L (ref 135–146)
Total Bilirubin: 0.6 mg/dL (ref 0.2–1.2)
Total Protein: 6 g/dL — ABNORMAL LOW (ref 6.1–8.1)
eGFR: 74 mL/min/{1.73_m2} (ref >=60)

## 2021-08-11 LAB — CBC WITH DIFFERENTIAL/PLATELET
Absolute Monocytes: 1558 cells/uL — ABNORMAL HIGH (ref 200–950)
Basophils Absolute: 88 cells/uL (ref 0–200)
Basophils Relative: 0.3 %
Eosinophils Absolute: 206 cells/uL (ref 15–500)
Eosinophils Relative: 0.7 %
HCT: 42 % (ref 35.0–45.0)
Hemoglobin: 13.4 g/dL (ref 11.7–15.5)
Lymphs Abs: 23520 cells/uL — ABNORMAL HIGH (ref 850–3900)
MCH: 29.3 pg (ref 27.0–33.0)
MCHC: 31.9 g/dL — ABNORMAL LOW (ref 32.0–36.0)
MCV: 91.7 fL (ref 80.0–100.0)
MPV: 10.3 fL (ref 7.5–12.5)
Monocytes Relative: 5.3 %
Neutro Abs: 4028 cells/uL (ref 1500–7800)
Neutrophils Relative %: 13.7 %
Platelets: 169 10*3/uL (ref 140–400)
RBC: 4.58 10*6/uL (ref 3.80–5.10)
RDW: 14.5 % (ref 11.0–15.0)
Total Lymphocyte: 80 %
WBC: 29.4 10*3/uL — ABNORMAL HIGH (ref 3.8–10.8)

## 2021-08-11 LAB — LIPASE: Lipase: 31 U/L (ref 7–60)

## 2021-09-11 NOTE — Progress Notes (Signed)
[facility]  [phone] [phone]  ID: [name] OB: 1951-05-07  MR#: [id number]  GEX#:[id number]  Patient Care Team: Ludwig Clarks, FNP as PCP - General (Family Medicine) Satira Sark, MD as PCP - Cardiology (Cardiology) Aviva Signs, MD (General Surgery) Rico Junker, RN as Registered Nurse Theodore Demark, RN as Registered Nurse Satira Sark, MD as Consulting Physician (Cardiology) Gala Romney Cristopher Estimable, MD as Consulting Physician (Gastroenterology) Lloyd Huger, MD as Consulting Physician (Hematology and Oncology) Ludwig Clarks, FNP (Family Medicine)  CHIEF COMPLAINT: CLL  INTERVAL HISTORY: Patient returns to clinic today for repeat laboratory work and routine evaluation.  She currently feels well and is asymptomatic.  She continues to have occasional left flank pain.  She does not complain of any weakness or fatigue. She has no neurologic complaints.  She denies any fevers, chills, or night sweats.  She has no chest pain, shortness of breath, cough, or hemoptysis. She denies any nausea, vomiting, constipation, or diarrhea.  She has no urinary complaints.  Patient offers no further specific complaints today.  REVIEW OF SYSTEMS:   Review of Systems  Constitutional: Negative.  Negative for diaphoresis, fever, malaise/fatigue and weight loss.  Respiratory: Negative.  Negative for cough and shortness of breath.   Cardiovascular: Negative.  Negative for chest pain and leg swelling.  Gastrointestinal: Negative.  Negative for abdominal pain, blood in stool, constipation, diarrhea, heartburn, melena, nausea and vomiting.  Genitourinary:  Positive for flank pain.  Musculoskeletal:  Negative for back pain and joint pain.  Skin: Negative.  Negative for rash.  Neurological: Negative.  Negative for dizziness, sensory change, focal weakness, weakness and headaches.  Psychiatric/Behavioral: Negative.  The patient is not  nervous/anxious.    As per HPI. Otherwise, a complete review of systems is negative.  PAST MEDICAL HISTORY: Past Medical History:  Diagnosis Date   Anemia    Arthritis    Asthma    CLL (chronic lymphocytic leukemia) ([place])    Essential hypertension    GERD (gastroesophageal reflux disease)    History of hiatal hernia    Hyperlipidemia    Paroxysmal atrial fibrillation (H[facility])    Splenomegaly     PAST SURGICAL HISTORY: Past Surgical History:  Procedure Laterality Date   ANKLE SURGERY Right    repair fracture   CARDIOVERSION N/A 03/20/2016   Procedure: CARDIOVERSION;  Surgeon: Satira Sark, MD;  Location: [facility];  Service: Cardiovascular;  Laterality: N/A;   CARDIOVERSION N/A 05/22/2016   Procedure: CARDIOVERSION;  Surgeon: Satira Sark, MD;  Location: [facility];  Service: Cardiovascular;  Laterality: N/A;   CHOLECYSTECTOMY     COLONOSCOPY WITH PROPOFOL N/A 03/27/2018   Internal hemorrhoids, diverticulosis in sigmoid and descending colon, three 4-6 mm polyps at IC valve. tubular adenomas 5 year surveillance   ESOPHAGOGASTRODUODENOSCOPY (EGD) WITH PROPOFOL N/A 03/27/2018   Moderate Schatzki's ring s/p dilation, small hiatal hernia   HERNIA REPAIR  2011   Incisional and umbilical utilizing mesh   MALONEY DILATION N/A 03/27/2018   Procedure: Venia Minks DILATION;  Surgeon: Daneil Dolin, MD;  Location: [facility];  Service: Endoscopy;  Laterality: N/A;   POLYPECTOMY  03/27/2018   Procedure: POLYPECTOMY;  Surgeon: Daneil Dolin, MD;  Location: [facility];  Service: Endoscopy;;  colon   ROTATOR CUFF REPAIR Left    TEE WITHOUT CARDIOVERSION N/A 01/17/2016   Procedure: TRANSESOPHAGEAL ECHOCARDIOGRAM (TEE);  Surgeon: Herminio Commons, MD;  Location: [facility];  Service:  Cardiovascular;  Laterality: N/A;   TEE WITHOUT CARDIOVERSION N/A 02/21/2016   Procedure: TRANSESOPHAGEAL ECHOCARDIOGRAM (TEE) WITH PROPOFOL;  Surgeon: Herminio Commons, MD;  Location: [facility];  Service:  Cardiovascular;  Laterality: N/A;   TEE WITHOUT CARDIOVERSION N/A 03/20/2016   Procedure: TRANSESOPHAGEAL ECHOCARDIOGRAM (TEE) WITH PROPOFOL;  Surgeon: Satira Sark, MD;  Location: [facility];  Service: Cardiovascular;  Laterality: N/A;   TONSILLECTOMY     TOTAL KNEE ARTHROPLASTY  2022    FAMILY HISTORY Family History  Problem Relation Age of Onset   Ovarian cancer Mother 77       Secondary to ovarian cancer   Leukemia Mother    Stroke Father 49       Brain stem infarction   Breast cancer Paternal Aunt    Crohn's disease Son    Colon cancer Neg Hx        ADVANCED DIRECTIVES:    HEALTH MAINTENANCE: Social History   Tobacco Use   Smoking status: Former    Packs/day: 0.25    Years: 2.00    Pack years: 0.50    Types: Cigarettes    Quit date: 08/03/1975    Years since quitting: 46.1   Smokeless tobacco: Never  Substance Use Topics   Alcohol use: No    Alcohol/week: 0.0 standard drinks   Drug use: No     Colonoscopy:  PAP:  Bone density:  Lipid panel:  Allergies  Allergen Reactions   Flagyl [Metronidazole]     Classic side effect of severe nausea, unable to tolerate.     Current Outpatient Medications  Medication Sig Dispense Refill   acetaminophen (TYLENOL) 650 MG CR tablet Take 650-1,300 mg by mouth daily as needed for pain.     apixaban (ELIQUIS) 5 MG TABS tablet Take 1 tablet (5 mg total) by mouth 2 (two) times daily. 180 tablet 3   cetirizine (ZYRTEC) 10 MG tablet Take 10 mg by mouth daily as needed for allergies.     Ferrous Gluconate-C-Folic Acid (IRON-C PO) Take 1 tablet by mouth daily.     flecainide (TAMBOCOR) 50 MG tablet TAKE 1 TABLET (50 MG TOTAL) BY MOUTH 2 (TWO) TIMES DAILY. 180 tablet 2   fluticasone (FLONASE) 50 MCG/ACT nasal spray Place 1 spray into both nostrils daily as needed for allergies or rhinitis.     furosemide (LASIX) 20 MG tablet TAKE 2 TABLETS (40 MG TOTAL) BY MOUTH DAILY. (Patient taking differently: Take 40 mg by mouth as needed.)  180 tablet 2   HYDROcodone-acetaminophen (NORCO/VICODIN) 5-325 MG tablet Take 1 tablet by mouth every 4 (four) hours as needed. 14 tablet 0   hydrocortisone (ANUSOL-HC) 2.5 % rectal cream Place 1 application rectally 2 (two) times daily. 30 g 1   levothyroxine (SYNTHROID, LEVOTHROID) 125 MCG tablet Take 125 mcg by mouth daily before breakfast.     metoprolol tartrate (LOPRESSOR) 50 MG tablet Take 1 tablet (50 mg total) by mouth 2 (two) times daily. 180 tablet 3   Multiple Vitamin (MULTIVITAMIN WITH MINERALS) TABS tablet Take 1 tablet by mouth daily.     pantoprazole (PROTONIX) 40 MG tablet TAKE ONE TABLET BY MOUTH TWO TIMES A DAY BEFORE A MEAL 90 tablet 3   sucralfate (CARAFATE) 1 GM/10ML suspension Take 10 mLs (1 g total) by mouth 4 (four) times daily. 30 minutes before eating 420 mL 1   HYDROcodone-acetaminophen (NORCO/VICODIN) 5-325 MG tablet Take 1 tablet by mouth every 6 (six) hours as needed for moderate pain. 60 tablet 0  promethazine (PHENERGAN) 25 MG tablet Take 1 tablet (25 mg total) by mouth every 6 (six) hours as needed for nausea or vomiting. 60 tablet 1   No current facility-administered medications for this visit.   Facility-Administered Medications Ordered in Other Visits  Medication Dose Route Frequency Provider Last Rate Last Admin   hydrocortisone cream 1 % 1 application  1 application Topical TID PRN Satira Sark, MD        OBJECTIVE: Vitals:   09/12/21 1354  BP: 104/71  Pulse: 73  Resp: 16  Temp: 97.7 F (36.5 C)  SpO2: 98%     Body mass index is 39.44 kg/m.    ECOG FS:0 - Asymptomatic  General: Well-developed, well-nourished, no acute distress. Eyes: Pink conjunctiva, anicteric sclera. HEENT: Normocephalic, moist mucous membranes. Lungs: No audible wheezing or coughing. Heart: Regular rate and rhythm. Abdomen: Soft, nontender, no obvious distention. Musculoskeletal: No edema, cyanosis, or clubbing. Neuro: Alert, answering all questions appropriately.  Cranial nerves grossly intact. Skin: No rashes or petechiae noted. Psych: Normal affect.   LAB RESULTS:  Lab Results  Component Value Date   NA 138 09/12/2021   K 3.8 09/12/2021   CL 102 09/12/2021   CO2 28 09/12/2021   GLUCOSE 96 09/12/2021   BUN 13 09/12/2021   CREATININE 0.79 09/12/2021   CALCIUM 10.0 09/12/2021   PROT 6.5 09/12/2021   ALBUMIN 4.1 09/12/2021   AST 18 09/12/2021   ALT 14 09/12/2021   ALKPHOS 112 09/12/2021   BILITOT 0.3 09/12/2021   GFRNONAA >60 09/12/2021   GFRAA >60 03/09/2020    Lab Results  Component Value Date   WBC 28.0 (H) 09/12/2021   NEUTROABS 3.6 09/12/2021   HGB 13.3 09/12/2021   HCT 42.9 09/12/2021   MCV 93.3 09/12/2021   PLT 139 (L) 09/12/2021     STUDIES: No results found.  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  PLAN:    1. CLL: Patient completed her second round of weekly single agent Rituxan x4 on August 20, 2019.  Patient underwent CT scan on June 20, 2021 for abdominal pain that did not reveal any distinct pathology and unchanged splenomegaly via and mild lymphadenopathy.  Her white blood cell count is stable at 20.0.  No intervention is needed at this time.  Return to clinic in 6 months with repeat imaging, laboratory work, and further evaluation.   If patient had recurrence of disease, would consider repeating Rituxan or switch to Imbruvica.  2.  Early satiety, indigestion: Patient does not complain of this today.  Likely secondary to splenomegaly.  3.  Atrial fibrillation: Continue monitoring and treatment per cardiology. 4.  Iron deficiency anemia: Resolved.  She last received IV Feraheme in May 2017.  5.  PET positive thyroid lesions: 1.2 cm right lobe nodule also seen on thyroid ultrasound April 06, 2016.  No change on recent CT scan.  Continue follow-up with ENT as indicated. 6.  Pain: Intermittent.  Patient continues to use hydrocodone sparingly. 7.  Nausea: Patient does not  complain of this today.  Continue Phenergan as needed. 8.  Thrombocytopenia: Chronic and unchanged.  Patient's platelet count is 139.  Likely related to underlying splenomegaly.   9.  Knee replacement: Continue follow-up with orthopedics and rehab as scheduled.  Patient expressed understanding and was in agreement with this plan. She also understands that She can call clinic at any time with any questions, concerns, or complaints.     Lloyd Huger,  MD   09/12/2021 4:17 PM

## 2021-09-12 ENCOUNTER — Inpatient Hospital Stay: Payer: Medicare Other

## 2021-09-12 ENCOUNTER — Inpatient Hospital Stay: Payer: Medicare Other | Attending: Oncology | Admitting: Oncology

## 2021-09-12 ENCOUNTER — Other Ambulatory Visit: Payer: Self-pay

## 2021-09-12 VITALS — BP 104/71 | HR 73 | Temp 97.7°F | Resp 16 | Wt 251.8 lb

## 2021-09-12 DIAGNOSIS — D696 Thrombocytopenia, unspecified: Secondary | ICD-10-CM | POA: Diagnosis not present

## 2021-09-12 DIAGNOSIS — R110 Nausea: Secondary | ICD-10-CM | POA: Diagnosis not present

## 2021-09-12 DIAGNOSIS — Z79899 Other long term (current) drug therapy: Secondary | ICD-10-CM | POA: Diagnosis not present

## 2021-09-12 DIAGNOSIS — C9110 Chronic lymphocytic leukemia of B-cell type not having achieved remission: Secondary | ICD-10-CM | POA: Insufficient documentation

## 2021-09-12 LAB — CBC WITH DIFFERENTIAL/PLATELET
Abs Immature Granulocytes: 0.05 10*3/uL (ref 0.00–0.07)
Basophils Absolute: 0.1 10*3/uL (ref 0.0–0.1)
Basophils Relative: 0 %
Eosinophils Absolute: 0.2 10*3/uL (ref 0.0–0.5)
Eosinophils Relative: 1 %
HCT: 42.9 % (ref 36.0–46.0)
Hemoglobin: 13.3 g/dL (ref 12.0–15.0)
Immature Granulocytes: 0 %
Lymphocytes Relative: 80 %
Lymphs Abs: 22.4 10*3/uL — ABNORMAL HIGH (ref 0.7–4.0)
MCH: 28.9 pg (ref 26.0–34.0)
MCHC: 31 g/dL (ref 30.0–36.0)
MCV: 93.3 fL (ref 80.0–100.0)
Monocytes Absolute: 1.6 10*3/uL — ABNORMAL HIGH (ref 0.1–1.0)
Monocytes Relative: 6 %
Neutro Abs: 3.6 10*3/uL (ref 1.7–7.7)
Neutrophils Relative %: 13 %
Platelets: 139 10*3/uL — ABNORMAL LOW (ref 150–400)
RBC: 4.6 MIL/uL (ref 3.87–5.11)
RDW: 14.6 % (ref 11.5–15.5)
Smear Review: NORMAL
WBC: 28 10*3/uL — ABNORMAL HIGH (ref 4.0–10.5)
nRBC: 0 % (ref 0.0–0.2)

## 2021-09-12 LAB — COMPREHENSIVE METABOLIC PANEL
ALT: 14 U/L (ref 0–44)
AST: 18 U/L (ref 15–41)
Albumin: 4.1 g/dL (ref 3.5–5.0)
Alkaline Phosphatase: 112 U/L (ref 38–126)
Anion gap: 8 (ref 5–15)
BUN: 13 mg/dL (ref 8–23)
CO2: 28 mmol/L (ref 22–32)
Calcium: 10 mg/dL (ref 8.9–10.3)
Chloride: 102 mmol/L (ref 98–111)
Creatinine, Ser: 0.79 mg/dL (ref 0.44–1.00)
GFR, Estimated: >60 mL/min (ref >60)
Glucose, Bld: 96 mg/dL (ref 70–99)
Potassium: 3.8 mmol/L (ref 3.5–5.1)
Sodium: 138 mmol/L (ref 135–145)
Total Bilirubin: 0.3 mg/dL (ref 0.3–1.2)
Total Protein: 6.5 g/dL (ref 6.5–8.1)

## 2021-09-12 MED ORDER — PROMETHAZINE HCL 25 MG PO TABS: 25.0000 mg | ORAL_TABLET | Freq: Four times a day (QID) | ORAL | 1 refills | Status: DC | PRN

## 2021-09-12 MED ORDER — HYDROCODONE-ACETAMINOPHEN 5-325 MG PO TABS: 1.0000 | ORAL_TABLET | Freq: Four times a day (QID) | ORAL | 0 refills | Status: DC | PRN

## 2021-09-12 NOTE — Progress Notes (Signed)
Pt reports "spleen pain" x 2-3 months as well as chronic fatigue. Pt requesting refill on hydrocodone and phenergan.

## 2021-09-27 ENCOUNTER — Telehealth: Payer: Self-pay | Admitting: *Deleted

## 2021-09-27 NOTE — Telephone Encounter (Signed)
Per last OV pt needs EGD/DIL propofol asa 3, [facility]. See's cardiology 10/12/21. FYI to Vicente Males as to why pt hasn'e been scheduled yet ?

## 2021-10-04 ENCOUNTER — Other Ambulatory Visit: Payer: Self-pay | Admitting: Cardiology

## 2021-10-12 ENCOUNTER — Ambulatory Visit: Payer: Medicare Other | Admitting: Cardiology

## 2021-10-17 NOTE — Telephone Encounter (Signed)
Daisy Black are we still awaiting clearance for pt to have procedure done? Or are we able to proceed? ?

## 2021-10-17 NOTE — Telephone Encounter (Signed)
She hasn't seen them yet. It is upcoming 10/26/21. Looks like had to reschedule.  ?

## 2021-10-17 NOTE — Telephone Encounter (Signed)
Noted. Will she need clearance?  ?

## 2021-10-18 NOTE — Telephone Encounter (Signed)
yes

## 2021-10-18 NOTE — Telephone Encounter (Signed)
Noted ?Will do clearance letter ?

## 2021-10-19 ENCOUNTER — Telehealth: Payer: Self-pay

## 2021-10-19 NOTE — Telephone Encounter (Signed)
DONE ? ?Waiting on a response ?

## 2021-10-19 NOTE — Telephone Encounter (Signed)
noted 

## 2021-10-19 NOTE — Telephone Encounter (Signed)
Will await 3/30 cardiology visit.  ?

## 2021-10-19 NOTE — Telephone Encounter (Signed)
Noted  

## 2021-10-19 NOTE — Telephone Encounter (Signed)
FYI ?Pt has documentation from [facility] in her note from today ?

## 2021-10-19 NOTE — Telephone Encounter (Signed)
Attention: Preop ? ? ?We would like to requesting Cardiac Clearance  ? ?Procedure: EGD/DIL ASA 3 ? ?Date: TBD ? ?Medication to hold: NONE ? ?Surgeon: Gala Romney ? ?Phone: [phone] ? ?Fax:  [phone] ? ?Type of Anesthesia: Propofol ? ? ? ? ? ? ?  ?

## 2021-10-19 NOTE — Telephone Encounter (Signed)
Preoperative team, patient has follow-up visit with Dr. Domenic Polite on 10/26/2021.  I will defer preoperative cardiac evaluation to him at that time.  Please add preoperative cardiac evaluation to the appointment note.  I will remove patient from preoperative pool.  Thank you for your help. ? ? ?Daisy Black. Abella Shugart NP-C ? ?  ?10/19/2021, 1:35 PM ?[facility] [place] ?[address] ?Office [phone] Fax [phone] ? ?

## 2021-10-26 ENCOUNTER — Ambulatory Visit: Payer: Medicare Other | Admitting: Cardiology

## 2021-11-15 ENCOUNTER — Emergency Department (HOSPITAL_COMMUNITY): Payer: Medicare Other

## 2021-11-15 ENCOUNTER — Encounter (HOSPITAL_COMMUNITY): Payer: Self-pay | Admitting: Emergency Medicine

## 2021-11-15 ENCOUNTER — Inpatient Hospital Stay (HOSPITAL_COMMUNITY)
Admission: EM | Admit: 2021-11-15 | Discharge: 2021-11-24 | DRG: 308 | Disposition: A | Discharge status: Home / Self Care | Payer: Medicare Other | Attending: Family Medicine | Admitting: Family Medicine

## 2021-11-15 DIAGNOSIS — K5792 Diverticulitis of intestine, part unspecified, without perforation or abscess without bleeding: Secondary | ICD-10-CM

## 2021-11-15 DIAGNOSIS — Z96659 Presence of unspecified artificial knee joint: Secondary | ICD-10-CM | POA: Diagnosis present

## 2021-11-15 DIAGNOSIS — R109 Unspecified abdominal pain: Secondary | ICD-10-CM

## 2021-11-15 DIAGNOSIS — I959 Hypotension, unspecified: Secondary | ICD-10-CM | POA: Diagnosis not present

## 2021-11-15 DIAGNOSIS — R161 Splenomegaly, not elsewhere classified: Secondary | ICD-10-CM | POA: Diagnosis present

## 2021-11-15 DIAGNOSIS — Z79890 Hormone replacement therapy (postmenopausal): Secondary | ICD-10-CM | POA: Diagnosis not present

## 2021-11-15 DIAGNOSIS — I4819 Other persistent atrial fibrillation: Secondary | ICD-10-CM | POA: Diagnosis not present

## 2021-11-15 DIAGNOSIS — I480 Paroxysmal atrial fibrillation: Secondary | ICD-10-CM | POA: Diagnosis not present

## 2021-11-15 DIAGNOSIS — E785 Hyperlipidemia, unspecified: Secondary | ICD-10-CM | POA: Diagnosis not present

## 2021-11-15 DIAGNOSIS — D696 Thrombocytopenia, unspecified: Secondary | ICD-10-CM

## 2021-11-15 DIAGNOSIS — I5031 Acute diastolic (congestive) heart failure: Secondary | ICD-10-CM | POA: Diagnosis present

## 2021-11-15 DIAGNOSIS — I4891 Unspecified atrial fibrillation: Secondary | ICD-10-CM | POA: Diagnosis present

## 2021-11-15 DIAGNOSIS — Z7901 Long term (current) use of anticoagulants: Secondary | ICD-10-CM

## 2021-11-15 DIAGNOSIS — I509 Heart failure, unspecified: Secondary | ICD-10-CM | POA: Diagnosis not present

## 2021-11-15 DIAGNOSIS — Z79899 Other long term (current) drug therapy: Secondary | ICD-10-CM | POA: Diagnosis not present

## 2021-11-15 DIAGNOSIS — E039 Hypothyroidism, unspecified: Secondary | ICD-10-CM

## 2021-11-15 DIAGNOSIS — C9110 Chronic lymphocytic leukemia of B-cell type not having achieved remission: Secondary | ICD-10-CM | POA: Diagnosis not present

## 2021-11-15 DIAGNOSIS — D72829 Elevated white blood cell count, unspecified: Secondary | ICD-10-CM

## 2021-11-15 DIAGNOSIS — R000 Tachycardia, unspecified: Secondary | ICD-10-CM | POA: Diagnosis present

## 2021-11-15 DIAGNOSIS — Z9049 Acquired absence of other specified parts of digestive tract: Secondary | ICD-10-CM | POA: Diagnosis not present

## 2021-11-15 DIAGNOSIS — K5732 Diverticulitis of large intestine without perforation or abscess without bleeding: Secondary | ICD-10-CM | POA: Diagnosis present

## 2021-11-15 DIAGNOSIS — Z881 Allergy status to other antibiotic agents status: Secondary | ICD-10-CM | POA: Diagnosis not present

## 2021-11-15 DIAGNOSIS — J45909 Unspecified asthma, uncomplicated: Secondary | ICD-10-CM | POA: Diagnosis not present

## 2021-11-15 DIAGNOSIS — K219 Gastro-esophageal reflux disease without esophagitis: Secondary | ICD-10-CM | POA: Diagnosis not present

## 2021-11-15 DIAGNOSIS — I110 Hypertensive heart disease with heart failure: Secondary | ICD-10-CM | POA: Diagnosis present

## 2021-11-15 DIAGNOSIS — Z87891 Personal history of nicotine dependence: Secondary | ICD-10-CM

## 2021-11-15 DIAGNOSIS — D638 Anemia in other chronic diseases classified elsewhere: Secondary | ICD-10-CM | POA: Diagnosis not present

## 2021-11-15 DIAGNOSIS — M1990 Unspecified osteoarthritis, unspecified site: Secondary | ICD-10-CM | POA: Diagnosis present

## 2021-11-15 DIAGNOSIS — I10 Essential (primary) hypertension: Secondary | ICD-10-CM | POA: Diagnosis not present

## 2021-11-15 LAB — COMPREHENSIVE METABOLIC PANEL
ALT: 12 U/L (ref 0–44)
AST: 13 U/L — ABNORMAL LOW (ref 15–41)
Albumin: 3.7 g/dL (ref 3.5–5.0)
Alkaline Phosphatase: 99 U/L (ref 38–126)
Anion gap: 5 (ref 5–15)
BUN: 23 mg/dL (ref 8–23)
CO2: 26 mmol/L (ref 22–32)
Calcium: 9.8 mg/dL (ref 8.9–10.3)
Chloride: 112 mmol/L — ABNORMAL HIGH (ref 98–111)
Creatinine, Ser: 0.75 mg/dL (ref 0.44–1.00)
GFR, Estimated: >60 mL/min (ref >60)
Glucose, Bld: 99 mg/dL (ref 70–99)
Potassium: 4 mmol/L (ref 3.5–5.1)
Sodium: 143 mmol/L (ref 135–145)
Total Bilirubin: 0.5 mg/dL (ref 0.3–1.2)
Total Protein: 6 g/dL — ABNORMAL LOW (ref 6.5–8.1)

## 2021-11-15 LAB — LIPASE, BLOOD: Lipase: 27 U/L (ref 11–51)

## 2021-11-15 LAB — CBC WITH DIFFERENTIAL/PLATELET
Basophils Absolute: 0 10*3/uL (ref 0.0–0.1)
Basophils Relative: 0 %
Eosinophils Absolute: 0 10*3/uL (ref 0.0–0.5)
Eosinophils Relative: 0 %
HCT: 40.7 % (ref 36.0–46.0)
Hemoglobin: 12.5 g/dL (ref 12.0–15.0)
Lymphocytes Relative: 59 %
Lymphs Abs: 14.3 10*3/uL — ABNORMAL HIGH (ref 0.7–4.0)
MCH: 29.4 pg (ref 26.0–34.0)
MCHC: 30.7 g/dL (ref 30.0–36.0)
MCV: 95.8 fL (ref 80.0–100.0)
Monocytes Absolute: 2.2 10*3/uL — ABNORMAL HIGH (ref 0.1–1.0)
Monocytes Relative: 9 %
Neutro Abs: 7.8 10*3/uL — ABNORMAL HIGH (ref 1.7–7.7)
Neutrophils Relative %: 32 %
Platelets: 131 10*3/uL — ABNORMAL LOW (ref 150–400)
RBC: 4.25 MIL/uL (ref 3.87–5.11)
RDW: 15.3 % (ref 11.5–15.5)
WBC: 24.3 10*3/uL — ABNORMAL HIGH (ref 4.0–10.5)
nRBC: 0 % (ref 0.0–0.2)

## 2021-11-15 LAB — URINALYSIS, ROUTINE W REFLEX MICROSCOPIC
Bilirubin Urine: NEGATIVE
Glucose, UA: NEGATIVE mg/dL
Hgb urine dipstick: NEGATIVE
Ketones, ur: NEGATIVE mg/dL
Leukocytes,Ua: NEGATIVE
Nitrite: NEGATIVE
Protein, ur: NEGATIVE mg/dL
Specific Gravity, Urine: 1.025 (ref 1.005–1.030)
pH: 6 (ref 5.0–8.0)

## 2021-11-15 MED ORDER — SODIUM CHLORIDE 0.9 % IV SOLN: INTRAVENOUS | Status: DC

## 2021-11-15 MED ORDER — ACETAMINOPHEN 325 MG PO TABS
650.0000 mg | ORAL_TABLET | Freq: Four times a day (QID) | ORAL | Status: DC | PRN
  Administered 2021-11-16 – 2021-11-23 (×4): 650 mg via ORAL
  Filled 2021-11-15 (×4): qty 2

## 2021-11-15 MED ORDER — IOHEXOL 300 MG/ML  SOLN
100.0000 mL | Freq: Once | INTRAMUSCULAR | Status: AC | PRN
  Administered 2021-11-15: 100 mL via INTRAVENOUS

## 2021-11-15 MED ORDER — ONDANSETRON HCL 4 MG/2ML IJ SOLN
4.0000 mg | Freq: Once | INTRAMUSCULAR | Status: AC
Start: 2021-11-15 — End: 2021-11-15
  Administered 2021-11-15: 4 mg via INTRAVENOUS
  Filled 2021-11-15: qty 2

## 2021-11-15 MED ORDER — ONDANSETRON HCL 4 MG/2ML IJ SOLN
4.0000 mg | Freq: Once | INTRAMUSCULAR | Status: AC
  Administered 2021-11-15: 4 mg via INTRAVENOUS
  Filled 2021-11-15: qty 2

## 2021-11-15 MED ORDER — ACETAMINOPHEN 650 MG RE SUPP: 650.0000 mg | Freq: Four times a day (QID) | RECTAL | Status: DC | PRN

## 2021-11-15 MED ORDER — LEVOFLOXACIN IN D5W 750 MG/150ML IV SOLN
750.0000 mg | Freq: Once | INTRAVENOUS | Status: AC
Start: 2021-11-15 — End: 2021-11-15
  Administered 2021-11-15: 750 mg via INTRAVENOUS
  Filled 2021-11-15: qty 150

## 2021-11-15 MED ORDER — DILTIAZEM HCL-DEXTROSE 125-5 MG/125ML-% IV SOLN (PREMIX)
5.0000 mg/h | INTRAVENOUS | Status: DC
  Administered 2021-11-15: 5 mg/h via INTRAVENOUS
  Filled 2021-11-15 (×2): qty 125

## 2021-11-15 MED ORDER — HYDROMORPHONE HCL 1 MG/ML IJ SOLN
1.0000 mg | Freq: Once | INTRAMUSCULAR | Status: AC
  Administered 2021-11-15: 1 mg via INTRAVENOUS
  Filled 2021-11-15: qty 1

## 2021-11-15 NOTE — ED Notes (Signed)
Un able to get second IV after 2 attempts  ?Hospitalist in room.  ?Will ask second RN ?

## 2021-11-15 NOTE — ED Provider Notes (Signed)
?[facility] ?Provider Note ? ? ?CSN: [id number] ?Arrival date & time: 11/15/21  1455 ? ?  ? ?History ? ?Chief Complaint  ?Patient presents with  ? Atrial Fibrillation  ? Hip Pain  ? ? ?Daisy Black is a 71 y.o. female. ? ?Patient was seen by her primary care doctor on Monday.  They noted that she was having atrial fibrillation with a heart rate around 130.  They recommended that she go immediately to the emergency department.  Patient went home she thinks the palpitations have continued since that time.  Her son insisted on her being evaluated.  She also has complaint of left lower quadrant abdominal pain now for several days.  No nausea or vomiting no fevers.  Patient's had pain in that area in the past with negative CTs.  Patient for the atrial fibrillation is on Eliquis she is on Lopressor and she is on flecainide.  She is followed by Dr. Domenic Polite from cardiology in the ED area. ? ?Past medical history significant for hypertension hyperlipidemia gastroesophageal reflux disease splenomegaly atrial fibrillation asthma and chronic lymphocytic leukemia.  Patient's had her gallbladder removed.  And total knee arthroplasty in 2022.  Patient is a former smoker quit 1977. ? ? ?  ? ?Home Medications ?Prior to Admission medications   ?Medication Sig Start Date End Date Taking? Authorizing Provider  ?acetaminophen (TYLENOL) 650 MG CR tablet Take 650-1,300 mg by mouth daily as needed for pain.   Yes [provider]  ?apixaban (ELIQUIS) 5 MG TABS tablet Take 1 tablet (5 mg total) by mouth 2 (two) times daily. 03/20/21  Yes Satira Sark, MD  ?Chlorpheniramine Maleate (CHLOR-TABLETS PO) Take 2 tablets by mouth daily.   Yes [provider]  ?docusate sodium (COLACE) 100 MG capsule Take 100 mg by mouth daily.   Yes [provider]  ?flecainide (TAMBOCOR) 50 MG tablet TAKE 1 TABLET (50 MG TOTAL) BY MOUTH 2 (TWO) TIMES DAILY. 10/05/21  Yes Satira Sark, MD  ?fluticasone  (FLONASE) 50 MCG/ACT nasal spray Place 1 spray into both nostrils daily as needed for allergies or rhinitis.   Yes [provider]  ?furosemide (LASIX) 20 MG tablet TAKE 2 TABLETS (40 MG TOTAL) BY MOUTH DAILY. ?Patient taking differently: Take 40 mg by mouth as needed. 01/12/21  Yes Satira Sark, MD  ?HYDROcodone-acetaminophen (NORCO/VICODIN) 5-325 MG tablet Take 1 tablet by mouth every 4 (four) hours as needed. 06/29/21  Yes Isla Pence, MD  ?hydrocortisone (ANUSOL-HC) 2.5 % rectal cream Place 1 application rectally 2 (two) times daily. 08/10/21  Yes Annitta Needs, NP  ?levothyroxine (SYNTHROID, LEVOTHROID) 125 MCG tablet Take 125 mcg by mouth daily before breakfast.   Yes [provider]  ?metoprolol tartrate (LOPRESSOR) 50 MG tablet Take 1 tablet (50 mg total) by mouth 2 (two) times daily. 06/06/21  Yes Satira Sark, MD  ?pantoprazole (PROTONIX) 40 MG tablet TAKE ONE TABLET BY MOUTH TWO TIMES A DAY BEFORE A MEAL 08/10/21  Yes Annitta Needs, NP  ?promethazine (PHENERGAN) 25 MG tablet Take 1 tablet (25 mg total) by mouth every 6 (six) hours as needed for nausea or vomiting. 09/12/21  Yes Lloyd Huger, MD  ?HYDROcodone-acetaminophen (NORCO/VICODIN) 5-325 MG tablet Take 1 tablet by mouth every 6 (six) hours as needed for moderate pain. ?Patient not taking: Reported on 11/15/2021 09/12/21   Lloyd Huger, MD  ?sucralfate (CARAFATE) 1 GM/10ML suspension Take 10 mLs (1 g total) by mouth 4 (four)  times daily. 30 minutes before eating ?Patient not taking: Reported on 11/15/2021 08/10/21   Annitta Needs, NP  ?   ? ?Allergies    ?Flagyl [metronidazole]   ? ?Review of Systems   ?Review of Systems  ?Constitutional:  Negative for chills and fever.  ?HENT:  Negative for ear pain and sore throat.   ?Eyes:  Negative for pain and visual disturbance.  ?Respiratory:  Negative for cough and shortness of breath.   ?Cardiovascular:  Positive for palpitations. Negative for chest pain.   ?Gastrointestinal:  Positive for abdominal pain. Negative for vomiting.  ?Genitourinary:  Negative for dysuria and hematuria.  ?Musculoskeletal:  Negative for arthralgias and back pain.  ?Skin:  Negative for color change and rash.  ?Neurological:  Negative for seizures and syncope.  ?Hematological:  Bruises/bleeds easily.  ?All other systems reviewed and are negative. ? ?Physical Exam ?Updated Vital Signs ?BP 102/61   Pulse (!) 115   Temp (!) 97.5 ?F (36.4 ?C) (Oral)   Resp 14   Ht 1.702 m ('5\' 7"'$ )   Wt 111.1 kg   SpO2 96%   BMI 38.37 kg/m?  ?Physical Exam ?Vitals and nursing note reviewed.  ?Constitutional:   ?   General: She is not in acute distress. ?   Appearance: Normal appearance. She is well-developed.  ?HENT:  ?   Head: Normocephalic and atraumatic.  ?Eyes:  ?   Extraocular Movements: Extraocular movements intact.  ?   Conjunctiva/sclera: Conjunctivae normal.  ?   Pupils: Pupils are equal, round, and reactive to light.  ?Cardiovascular:  ?   Rate and Rhythm: Tachycardia present. Rhythm irregular.  ?   Heart sounds: No murmur heard. ?Pulmonary:  ?   Effort: Pulmonary effort is normal. No respiratory distress.  ?   Breath sounds: Normal breath sounds.  ?Abdominal:  ?   Palpations: Abdomen is soft.  ?   Tenderness: There is abdominal tenderness. There is no guarding.  ?   Comments: Tender left lower quadrant.  ?Musculoskeletal:     ?   General: No swelling.  ?   Cervical back: Normal range of motion and neck supple.  ?Skin: ?   General: Skin is warm and dry.  ?   Capillary Refill: Capillary refill takes less than 2 seconds.  ?Neurological:  ?   General: No focal deficit present.  ?   Mental Status: She is alert and oriented to person, place, and time.  ?Psychiatric:     ?   Mood and Affect: Mood normal.  ? ? ?ED Results / Procedures / Treatments   ?Labs ?(all labs ordered are listed, but only abnormal results are displayed) ?Labs Reviewed  ?CBC WITH DIFFERENTIAL/PLATELET - Abnormal; Notable for the  following components:  ?    Result Value  ? WBC 24.3 (*)   ? Platelets 131 (*)   ? Neutro Abs 7.8 (*)   ? Lymphs Abs 14.3 (*)   ? Monocytes Absolute 2.2 (*)   ? All other components within normal limits  ?COMPREHENSIVE METABOLIC PANEL - Abnormal; Notable for the following components:  ? Chloride 112 (*)   ? Total Protein 6.0 (*)   ? AST 13 (*)   ? All other components within normal limits  ?URINALYSIS, ROUTINE W REFLEX MICROSCOPIC  ?LIPASE, BLOOD  ? ? ?EKG ?EKG Interpretation ? ?Date/Time:  Wednesday November 15 2021 15:11:38 EDT ?Ventricular Rate:  115 ?PR Interval:    ?QRS Duration: 98 ?QT Interval:  332 ?QTC Calculation: 460 ?R  Axis:   22 ?Text Interpretation: Atrial fibrillation Abnormal R-wave progression, early transition Inferior infarct, old Confirmed by Noemi Chapel [phone]) on 11/15/2021 3:21:44 PM ? ?Radiology ?CT Abdomen Pelvis W Contrast ? ?Result Date: 11/15/2021 ?CLINICAL DATA:  Left lower quadrant abdominal pain, nausea for 4 days, history of CLL EXAM: CT ABDOMEN AND PELVIS WITH CONTRAST TECHNIQUE: Multidetector CT imaging of the abdomen and pelvis was performed using the standard protocol following bolus administration of intravenous contrast. RADIATION DOSE REDUCTION: This exam was performed according to the departmental dose-optimization program which includes automated exposure control, adjustment of the mA and/or kV according to patient size and/or use of iterative reconstruction technique. CONTRAST:  162m OMNIPAQUE IOHEXOL 300 MG/ML  SOLN COMPARISON:  06/28/2021, 01/11/2021 FINDINGS: Lower chest: No acute pleural or parenchymal lung disease. Hepatobiliary: Cholecystectomy. Liver is unremarkable. No biliary duct dilation. Pancreas: Unremarkable. No pancreatic ductal dilatation or surrounding inflammatory changes. Spleen: Stable splenomegaly, measuring 16.4 by 13.8 by 6.0 cm. No focal parenchymal abnormality. Adrenals/Urinary Tract: Adrenals are unremarkable. Kidneys enhance normally and  symmetrically. No urinary tract calculi or obstructive uropathy. Bladder is minimally distended without focal abnormality. Stomach/Bowel: Scattered diverticulosis of the colon. Wall thickening and pericolonic fat stranding at the junction of

## 2021-11-15 NOTE — H&P (Signed)
?History and Physical  ? ? ?Patient: Daisy Black DOB: 20-Oct-1950 ?DOA: 11/15/2021 ?DOS: the patient was seen and examined on 11/16/2021 ?PCP: Ludwig Clarks, FNP  ?Patient coming from: Home ? ?Chief Complaint:  ?Chief Complaint  ?Patient presents with  ? Atrial Fibrillation  ? Hip Pain  ? ?HPI: Daisy Black is a 71 y.o. female with medical history significant of CLL (Follows with Dr Grayland Ormond, [facility]), A-Fib on Eliquis, GERD, Hypothyroidism who presents to the ED accompanied by husband with complaint of  abnormal heart rhythm that has been ongoing for about 8 days. She went to see her PCP on Monday (2 days ago) and she was asked to go to the ED, but patient went back home. The palpitations continued and was worrisome, so, she presented to the ED for further evaluation and management. She also complained of several days of onset of eft lower quadrant  pain which was stabbing in nature, pain was intermittent and was rated as 7/10 on pain scale. There was no known alleviating or aggravating factors. Pain was associated with nausea without vomiting. She had similar pain in Nov 2022, CT of abdomen done at that time was unrevealing of any abnormality. ? ?ED Course:  ?In the ED, she was tachycardic, BP was soft at 84/70, other vital signs were within normal levels. Work up in the ED showed leukocytosis and leukopenia,BMP was normal. Lipase 27 ?CT abdomen and Pelvis with contrast showed Acute uncomplicated diverticulitis at the junction of the descending and sigmoid colon. No perforation, fluid collection, or abscess. ?Chest X -Ray showed no active disease ?Patient was treated with IV Cardizem drip, Dilaudid was given due to abdominal pain and she was started on IV Levaquin.  IV hydration was provided.  Hospitalist was asked to admit patient for further evaluation and management. ? ? ?Review of Systems: ?Review of systems as noted in the HPI. All other systems reviewed and are negative. ? ? ?Past  Medical History:  ?Diagnosis Date  ? Anemia   ? Arthritis   ? Asthma   ? CLL (chronic lymphocytic leukemia) ([place])   ? Essential hypertension   ? GERD (gastroesophageal reflux disease)   ? History of hiatal hernia   ? Hyperlipidemia   ? Paroxysmal atrial fibrillation (HCC)   ? Splenomegaly   ? ?Past Surgical History:  ?Procedure Laterality Date  ? ANKLE SURGERY Right   ? repair fracture  ? CARDIOVERSION N/A 03/20/2016  ? Procedure: CARDIOVERSION;  Surgeon: Satira Sark, MD;  [facility]: [facility];  Service: Cardiovascular;  Laterality: N/A;  ? CARDIOVERSION N/A 05/22/2016  ? Procedure: CARDIOVERSION;  Surgeon: Satira Sark, MD;  Location: [facility];  Service: Cardiovascular;  Laterality: N/A;  ? CHOLECYSTECTOMY    ? COLONOSCOPY WITH PROPOFOL N/A 03/27/2018  ? Internal hemorrhoids, diverticulosis in sigmoid and descending colon, three 4-6 mm polyps at IC valve. tubular adenomas 5 year surveillance  ? ESOPHAGOGASTRODUODENOSCOPY (EGD) WITH PROPOFOL N/A 03/27/2018  ? Moderate Schatzki's ring s/p dilation, small hiatal hernia  ? HERNIA REPAIR  2011  ? Incisional and umbilical utilizing mesh  ? MALONEY DILATION N/A 03/27/2018  ? Procedure: MALONEY DILATION;  Surgeon: Daneil Dolin, MD;  [facility];  Service: Endoscopy;  Laterality: N/A;  ? POLYPECTOMY  03/27/2018  ? Procedure: POLYPECTOMY;  Surgeon: Daneil Dolin, MD;  [facility];  Service: Endoscopy;;  colon  ? ROTATOR CUFF REPAIR Left   ? TEE WITHOUT CARDIOVERSION N/A 01/17/2016  ? Procedure:  TRANSESOPHAGEAL ECHOCARDIOGRAM (TEE);  Surgeon: Herminio Commons, MD;  Location: [facility];  Service: Cardiovascular;  Laterality: N/A;  ? TEE WITHOUT CARDIOVERSION N/A 02/21/2016  ? Procedure: TRANSESOPHAGEAL ECHOCARDIOGRAM (TEE) WITH PROPOFOL;  Surgeon: Herminio Commons, MD;  [facility]: [facility];  Service: Cardiovascular;  Laterality: N/A;  ? TEE WITHOUT CARDIOVERSION N/A 03/20/2016  ? Procedure: TRANSESOPHAGEAL ECHOCARDIOGRAM (TEE) WITH PROPOFOL;   Surgeon: Satira Sark, MD;  Location: [facility];  Service: Cardiovascular;  Laterality: N/A;  ? TONSILLECTOMY    ? TOTAL KNEE ARTHROPLASTY  2022  ? ? ?Social History:  reports that she quit smoking about 46 years ago. Her smoking use included cigarettes. She has a 0.50 pack-year smoking history. She has never used smokeless tobacco. She reports that she does not drink alcohol and does not use drugs. ? ? ?Allergies  ?Allergen Reactions  ? Flagyl [Metronidazole]   ?  Classic side effect of severe nausea, unable to tolerate.   ? ? ?Family History  ?Problem Relation Age of Onset  ? Ovarian cancer Mother 45  ?     Secondary to ovarian cancer  ? Leukemia Mother   ? Stroke Father 77  ?     Brain stem infarction  ? Breast cancer Paternal Aunt   ? Crohn's disease Son   ? Colon cancer Neg Hx   ?  ? ?Prior to Admission medications   ?Medication Sig Start Date End Date Taking? Authorizing Provider  ?acetaminophen (TYLENOL) 650 MG CR tablet Take 650-1,300 mg by mouth daily as needed for pain.   Yes [provider]  ?apixaban (ELIQUIS) 5 MG TABS tablet Take 1 tablet (5 mg total) by mouth 2 (two) times daily. 03/20/21  Yes Satira Sark, MD  ?Chlorpheniramine Maleate (CHLOR-TABLETS PO) Take 2 tablets by mouth daily.   Yes [provider]  ?docusate sodium (COLACE) 100 MG capsule Take 100 mg by mouth daily.   Yes [provider]  ?flecainide (TAMBOCOR) 50 MG tablet TAKE 1 TABLET (50 MG TOTAL) BY MOUTH 2 (TWO) TIMES DAILY. 10/05/21  Yes Satira Sark, MD  ?fluticasone (FLONASE) 50 MCG/ACT nasal spray Place 1 spray into both nostrils daily as needed for allergies or rhinitis.   Yes [provider]  ?furosemide (LASIX) 20 MG tablet TAKE 2 TABLETS (40 MG TOTAL) BY MOUTH DAILY. ?Patient taking differently: Take 40 mg by mouth as needed. 01/12/21  Yes Satira Sark, MD  ?HYDROcodone-acetaminophen (NORCO/VICODIN) 5-325 MG tablet Take 1 tablet by mouth every 4 (four) hours as needed. 06/29/21   Yes Isla Pence, MD  ?hydrocortisone (ANUSOL-HC) 2.5 % rectal cream Place 1 application rectally 2 (two) times daily. 08/10/21  Yes Annitta Needs, NP  ?levothyroxine (SYNTHROID, LEVOTHROID) 125 MCG tablet Take 125 mcg by mouth daily before breakfast.   Yes [provider]  ?metoprolol tartrate (LOPRESSOR) 50 MG tablet Take 1 tablet (50 mg total) by mouth 2 (two) times daily. 06/06/21  Yes Satira Sark, MD  ?pantoprazole (PROTONIX) 40 MG tablet TAKE ONE TABLET BY MOUTH TWO TIMES A DAY BEFORE A MEAL 08/10/21  Yes Annitta Needs, NP  ?promethazine (PHENERGAN) 25 MG tablet Take 1 tablet (25 mg total) by mouth every 6 (six) hours as needed for nausea or vomiting. 09/12/21  Yes Lloyd Huger, MD  ?HYDROcodone-acetaminophen (NORCO/VICODIN) 5-325 MG tablet Take 1 tablet by mouth every 6 (six) hours as needed for moderate pain. ?Patient not taking: Reported on 11/15/2021 09/12/21   Lloyd Huger, MD  ?  sucralfate (CARAFATE) 1 GM/10ML suspension Take 10 mLs (1 g total) by mouth 4 (four) times daily. 30 minutes before eating ?Patient not taking: Reported on 11/15/2021 08/10/21   Annitta Needs, NP  ? ? ?Physical Exam: ?BP 96/62   Pulse (!) 117   Temp (!) 97.5 ?F (36.4 ?C) (Oral)   Resp 15   Ht '5\' 7"'$  (1.702 m)   Wt 111.1 kg   SpO2 95%   BMI 38.37 kg/m?  ? ?General: 71 y.o. year-old female well developed well nourished in no acute distress.  Alert and oriented x3. ?HEENT: NCAT, EOMI ?Neck: Supple, trachea medial ?Cardiovascular: Tachycardia.  Irregular rate and rhythm with no rubs or gallops.  No thyromegaly or JVD noted.  No lower extremity edema. 2/4 pulses in all 4 extremities. ?Respiratory: Clear to auscultation with no wheezes or rales. Good inspiratory effort. ?Abdomen: Soft, tender to palpation of LLQ. Normal bowel sounds x4 quadrants. ?Muskuloskeletal: No cyanosis, clubbing or edema noted bilaterally ?Neuro: CN II-XII intact, strength 5/5 x 4, sensation, reflexes intact ?Skin: No ulcerative  lesions noted or rashes ?Psychiatry: Mood is appropriate for condition and setting ?   ?   ?   ?Labs on Admission:  ?Basic Metabolic Panel: ?Recent Labs  ?Lab 11/15/21 ?0254  ?NA 143  ?K 4.0  ?CL 112*  ?CO2 26  ?GL

## 2021-11-15 NOTE — ED Notes (Signed)
Family to desk stating pt is c/o nausea. Edp notified.  ?

## 2021-11-15 NOTE — ED Notes (Signed)
Pt has had pain in lower abd off and on for months. Pt went to her PCP on Monday and discuss about A-Fib but not Abd. Pt felt her abdominal pain may have been gas. Pt verbalized a month ago she went to PCP with c/o stomach pains and she gave her abt for UTI. ?

## 2021-11-15 NOTE — ED Notes (Signed)
Cardizem decreased due to blood pressure  ?

## 2021-11-15 NOTE — ED Notes (Signed)
Pt verbalized she ambulates without any assistance and still drives.  ?

## 2021-11-15 NOTE — ED Notes (Signed)
Pt ambulated to bathroom 

## 2021-11-15 NOTE — ED Triage Notes (Signed)
Pt reports heart palpitations for a week. Saw her PCP yesterday and was told it was afib. States nothing changed today, but she is having right sided hip pain. Pt on medications for afib, eliquis. ?

## 2021-11-15 NOTE — ED Notes (Signed)
Pt informed she can not eat or drink yet.  ?

## 2021-11-16 ENCOUNTER — Encounter (HOSPITAL_COMMUNITY): Payer: Self-pay | Admitting: Internal Medicine

## 2021-11-16 ENCOUNTER — Other Ambulatory Visit: Payer: Self-pay

## 2021-11-16 DIAGNOSIS — D696 Thrombocytopenia, unspecified: Secondary | ICD-10-CM

## 2021-11-16 DIAGNOSIS — I480 Paroxysmal atrial fibrillation: Secondary | ICD-10-CM | POA: Diagnosis not present

## 2021-11-16 DIAGNOSIS — E039 Hypothyroidism, unspecified: Secondary | ICD-10-CM

## 2021-11-16 DIAGNOSIS — K5792 Diverticulitis of intestine, part unspecified, without perforation or abscess without bleeding: Secondary | ICD-10-CM

## 2021-11-16 LAB — CBC
HCT: 40.2 % (ref 36.0–46.0)
Hemoglobin: 11.8 g/dL — ABNORMAL LOW (ref 12.0–15.0)
MCH: 28.2 pg (ref 26.0–34.0)
MCHC: 29.4 g/dL — ABNORMAL LOW (ref 30.0–36.0)
MCV: 95.9 fL (ref 80.0–100.0)
Platelets: 119 10*3/uL — ABNORMAL LOW (ref 150–400)
RBC: 4.19 MIL/uL (ref 3.87–5.11)
RDW: 15.1 % (ref 11.5–15.5)
WBC: 17.6 10*3/uL — ABNORMAL HIGH (ref 4.0–10.5)
nRBC: 0 % (ref 0.0–0.2)

## 2021-11-16 LAB — COMPREHENSIVE METABOLIC PANEL
ALT: 13 U/L (ref 0–44)
AST: 13 U/L — ABNORMAL LOW (ref 15–41)
Albumin: 3.3 g/dL — ABNORMAL LOW (ref 3.5–5.0)
Alkaline Phosphatase: 91 U/L (ref 38–126)
Anion gap: 4 — ABNORMAL LOW (ref 5–15)
BUN: 18 mg/dL (ref 8–23)
CO2: 26 mmol/L (ref 22–32)
Calcium: 9.4 mg/dL (ref 8.9–10.3)
Chloride: 110 mmol/L (ref 98–111)
Creatinine, Ser: 0.69 mg/dL (ref 0.44–1.00)
GFR, Estimated: >60 mL/min (ref >60)
Glucose, Bld: 120 mg/dL — ABNORMAL HIGH (ref 70–99)
Potassium: 3.7 mmol/L (ref 3.5–5.1)
Sodium: 140 mmol/L (ref 135–145)
Total Bilirubin: 0.6 mg/dL (ref 0.3–1.2)
Total Protein: 5.8 g/dL — ABNORMAL LOW (ref 6.5–8.1)

## 2021-11-16 LAB — MAGNESIUM: Magnesium: 2.2 mg/dL (ref 1.7–2.4)

## 2021-11-16 LAB — PHOSPHORUS: Phosphorus: 3.1 mg/dL (ref 2.5–4.6)

## 2021-11-16 LAB — MRSA NEXT GEN BY PCR, NASAL: MRSA by PCR Next Gen: NOT DETECTED

## 2021-11-16 MED ORDER — METOPROLOL TARTRATE 25 MG PO TABS
12.5000 mg | ORAL_TABLET | Freq: Three times a day (TID) | ORAL | Status: DC
  Administered 2021-11-16 (×2): 12.5 mg via ORAL
  Filled 2021-11-16 (×2): qty 1

## 2021-11-16 MED ORDER — FLECAINIDE ACETATE 50 MG PO TABS
50.0000 mg | ORAL_TABLET | Freq: Two times a day (BID) | ORAL | Status: DC
  Administered 2021-11-16 – 2021-11-17 (×3): 50 mg via ORAL
  Filled 2021-11-16 (×9): qty 1

## 2021-11-16 MED ORDER — SODIUM CHLORIDE 0.9 % IV SOLN: INTRAVENOUS | Status: DC

## 2021-11-16 MED ORDER — FENTANYL CITRATE PF 50 MCG/ML IJ SOSY
25.0000 ug | PREFILLED_SYRINGE | INTRAMUSCULAR | Status: DC | PRN
Start: 2021-11-16 — End: 2021-11-24
  Administered 2021-11-16 (×2): 25 ug via INTRAVENOUS
  Filled 2021-11-16 (×2): qty 1

## 2021-11-16 MED ORDER — APIXABAN 5 MG PO TABS
5.0000 mg | ORAL_TABLET | Freq: Two times a day (BID) | ORAL | Status: DC
  Administered 2021-11-16 – 2021-11-24 (×17): 5 mg via ORAL
  Filled 2021-11-16 (×17): qty 1

## 2021-11-16 MED ORDER — LEVOTHYROXINE SODIUM 125 MCG PO TABS
125.0000 ug | ORAL_TABLET | Freq: Every day | ORAL | Status: DC
  Administered 2021-11-16 – 2021-11-24 (×9): 125 ug via ORAL
  Filled 2021-11-16 (×9): qty 1

## 2021-11-16 MED ORDER — PANTOPRAZOLE SODIUM 40 MG PO TBEC
40.0000 mg | DELAYED_RELEASE_TABLET | Freq: Every day | ORAL | Status: DC
  Administered 2021-11-16 – 2021-11-24 (×9): 40 mg via ORAL
  Filled 2021-11-16 (×9): qty 1

## 2021-11-16 MED ORDER — CHLORHEXIDINE GLUCONATE CLOTH 2 % EX PADS
6.0000 | MEDICATED_PAD | Freq: Every day | CUTANEOUS | Status: DC
  Administered 2021-11-16 – 2021-11-24 (×9): 6 via TOPICAL

## 2021-11-16 MED ORDER — PIPERACILLIN-TAZOBACTAM 3.375 G IVPB
3.3750 g | Freq: Three times a day (TID) | INTRAVENOUS | Status: DC
  Administered 2021-11-16 – 2021-11-23 (×22): 3.375 g via INTRAVENOUS
  Filled 2021-11-16 (×23): qty 50

## 2021-11-16 NOTE — Progress Notes (Signed)
Pharmacy Antibiotic Note ? ?Daisy Black is a 71 y.o. female admitted on 11/15/2021 with  intra-abdomina infection .  Pharmacy has been consulted for Zosyn dosing. WBC elevated. Renal function ok.  ? ?Plan: ?Zosyn 3.375G IV q8h to be infused over 4 hours ?Trend WBC, temp, renal function  ?F/U infectious work-up ? ?Height: '5\' 7"'$  (170.2 cm) ?Weight: 111.1 kg (245 lb) ?IBW/kg (Calculated) : 61.6 ? ?Temp (24hrs), Avg:97.5 ?F (36.4 ?C), Min:97.5 ?F (36.4 ?C), Max:97.5 ?F (36.4 ?C) ? ?Recent Labs  ?Lab 11/15/21 ?3578  ?WBC 24.3*  ?CREATININE 0.75  ?  ?Estimated Creatinine Clearance: 82.9 mL/min (by C-G formula based on SCr of 0.75 mg/dL).   ? ?Allergies  ?Allergen Reactions  ? Flagyl [Metronidazole]   ?  Classic side effect of severe nausea, unable to tolerate.   ? ? ?Narda Bonds, PharmD, BCPS ?Clinical Pharmacist ?Phone: [phone] ? ?

## 2021-11-16 NOTE — Consult Note (Addendum)
?Cardiology Consultation:  ? ?Patient ID: Daisy Black ?MRN: [id number]; DOB: [birth year] ? ?Admit date: 11/15/2021 ?Date of Consult: 11/16/2021 ? ?PCP:  Ludwig Clarks, FNP ?  ?Daisy Black ?Cardiologist:  Rozann Lesches, MD      ? ? ?Patient Profile:  ? ?Daisy Black is a 71 y.o. female with a hx of paroxysmal atrial fibrillation (on Flecainide), HLD, GERD and CLL who is being seen 11/16/2021 for the evaluation of atrial fibrillation at the request of Dr. Josephine Cables. ? ?History of Present Illness:  ? ?Ms. Yiu was last examined by Dr. Domenic Polite in 03/2021 and reported more frequent palpitations over the past several months. She was in normal sinus rhythm by review of the notes and was continued on Eliquis and Flecainide 91m BID with Lopressor being increased to 50 mg twice daily. ? ?[facility] of worsening palpitations for the past week. She also reported having worsening pain along her left lower abdominal quadrant for several days with no associated symptoms. In talking with the patient and her husband today, she reports feeling like she went into atrial fibrillation a week ago last Thursday but thought this was initially secondary to a recent spinal injection. She reports she had been experiencing left-sided abdominal discomfort intermittently for several weeks but pain increased in frequency and severity. No associated melena, hematochezia, diarrhea or constipation. No recent chest pain, dyspnea on exertion, orthopnea or PND. She does take Lasix as needed for lower extremity edema.  ? ?Initial labs showed WBC 24.3, Hgb 12.5, platelets 131, Na+ 143, K+ 4.0 and creatinine 0.75. AST 13, ALT 12 with alk phos at 99. Lipase 27. CXR with no active cardiopulmonary disease. CT abdomen showed acute diverticulitis at the junction of the descending and sigmoid colon with no evidence of perforation or abscess. EKG showed atrial fibrillation with RVR, heart  rate 115. ? ?She was started on Zosyn for her diverticulitis and also required initiation of IV Cardizem for rate control. She was continued on Eliquis but her PTA Lopressor 543mBID and Flecainide 5030mID were not ordered for unclear reasons. She did have hypotension overnight which led to dose reduction of IV Cardizem.  ? ? ?Past Medical History:  ?Diagnosis Date  ? Anemia   ? Arthritis   ? Asthma   ? [place] ? Essential hypertension   ? GERD (gastroesophageal reflux disease)   ? History of hiatal hernia   ? Hyperlipidemia   ? Paroxysmal atrial fibrillation ([facility])   ? Splenomegaly   ? ? ?Past Surgical History:  ?Procedure Laterality Date  ? ANKLE SURGERY Right   ? repair fracture  ? CARDIOVERSION N/A 03/20/2016  ? Procedure: CARDIOVERSION;  Surgeon: SamSatira SarkD;  [facility];  Service: Cardiovascular;  Laterality: N/A;  ? CARDIOVERSION N/A 05/22/2016  ? Procedure: CARDIOVERSION;  Surgeon: SamSatira SarkD;  Location: [facility];  Service: Cardiovascular;  Laterality: N/A;  ? CHOLECYSTECTOMY    ? COLONOSCOPY WITH PROPOFOL N/A 03/27/2018  ? Internal hemorrhoids, diverticulosis in sigmoid and descending colon, three 4-6 mm polyps at IC valve. tubular adenomas 5 year surveillance  ? ESOPHAGOGASTRODUODENOSCOPY (EGD) WITH PROPOFOL N/A 03/27/2018  ? Moderate Schatzki's ring s/p dilation, small hiatal hernia  ? HERNIA REPAIR  2011  ? Incisional and umbilical utilizing mesh  ? MALONEY DILATION N/A 03/27/2018  ? Procedure: MALONEY DILATION;  Surgeon: RouDaneil DolinD;  [facility];  Service: Endoscopy;  Laterality: N/A;  ? POLYPECTOMY  03/27/2018  ? Procedure: POLYPECTOMY;  Surgeon: Daneil Dolin, MD;  [facility];  Service: Endoscopy;;  colon  ? ROTATOR CUFF REPAIR Left   ? TEE WITHOUT CARDIOVERSION N/A 01/17/2016  ? Procedure: TRANSESOPHAGEAL ECHOCARDIOGRAM (TEE);  Surgeon: Herminio Commons, MD;  Location: [facility];  Service: Cardiovascular;   Laterality: N/A;  ? TEE WITHOUT CARDIOVERSION N/A 02/21/2016  ? Procedure: TRANSESOPHAGEAL ECHOCARDIOGRAM (TEE) WITH PROPOFOL;  Surgeon: Herminio Commons, MD;  Location: [facility];  Service: Cardiovascular;  Laterality: N/A;  ? TEE WITHOUT CARDIOVERSION N/A 03/20/2016  ? Procedure: TRANSESOPHAGEAL ECHOCARDIOGRAM (TEE) WITH PROPOFOL;  Surgeon: Satira Sark, MD;  Location: [facility];  Service: Cardiovascular;  Laterality: N/A;  ? TONSILLECTOMY    ? TOTAL KNEE ARTHROPLASTY  2022  ?  ? ?Home Medications:  ?Prior to Admission medications   ?Medication Sig Start Date End Date Taking? Authorizing Provider  ?acetaminophen (TYLENOL) 650 MG CR tablet Take 650-1,300 mg by mouth daily as needed for pain.   Yes [provider]  ?apixaban (ELIQUIS) 5 MG TABS tablet Take 1 tablet (5 mg total) by mouth 2 (two) times daily. 03/20/21  Yes Satira Sark, MD  ?Chlorpheniramine Maleate (CHLOR-TABLETS PO) Take 2 tablets by mouth daily.   Yes [provider]  ?docusate sodium (COLACE) 100 MG capsule Take 100 mg by mouth daily.   Yes [provider]  ?flecainide (TAMBOCOR) 50 MG tablet TAKE 1 TABLET (50 MG TOTAL) BY MOUTH 2 (TWO) TIMES DAILY. 10/05/21  Yes Satira Sark, MD  ?fluticasone (FLONASE) 50 MCG/ACT nasal spray Place 1 spray into both nostrils daily as needed for allergies or rhinitis.   Yes [provider]  ?furosemide (LASIX) 20 MG tablet TAKE 2 TABLETS (40 MG TOTAL) BY MOUTH DAILY. ?Patient taking differently: Take 40 mg by mouth as needed. 01/12/21  Yes Satira Sark, MD  ?HYDROcodone-acetaminophen (NORCO/VICODIN) 5-325 MG tablet Take 1 tablet by mouth every 4 (four) hours as needed. 06/29/21  Yes Isla Pence, MD  ?hydrocortisone (ANUSOL-HC) 2.5 % rectal cream Place 1 application rectally 2 (two) times daily. 08/10/21  Yes Annitta Needs, NP  ?levothyroxine (SYNTHROID, LEVOTHROID) 125 MCG tablet Take 125 mcg by mouth daily before breakfast.   Yes [provider]   ?metoprolol tartrate (LOPRESSOR) 50 MG tablet Take 1 tablet (50 mg total) by mouth 2 (two) times daily. 06/06/21  Yes Satira Sark, MD  ?pantoprazole (PROTONIX) 40 MG tablet TAKE ONE TABLET BY MOUTH TWO TIMES A DAY BEFORE A MEAL 08/10/21  Yes Annitta Needs, NP  ?promethazine (PHENERGAN) 25 MG tablet Take 1 tablet (25 mg total) by mouth every 6 (six) hours as needed for nausea or vomiting. 09/12/21  Yes Lloyd Huger, MD  ?HYDROcodone-acetaminophen (NORCO/VICODIN) 5-325 MG tablet Take 1 tablet by mouth every 6 (six) hours as needed for moderate pain. ?Patient not taking: Reported on 11/15/2021 09/12/21   Lloyd Huger, MD  ?sucralfate (CARAFATE) 1 GM/10ML suspension Take 10 mLs (1 g total) by mouth 4 (four) times daily. 30 minutes before eating ?Patient not taking: Reported on 11/15/2021 08/10/21   Annitta Needs, NP  ? ? ?Inpatient Medications: ?Scheduled Meds: ? apixaban  5 mg Oral BID  ? Chlorhexidine Gluconate Cloth  6 each Topical Daily  ? flecainide  50 mg Oral BID  ? levothyroxine  125 mcg Oral Q0600  ? pantoprazole  40 mg Oral Daily  ? ?Continuous Infusions: ?  sodium chloride 100 mL/hr at 11/16/21 0024  ? diltiazem (CARDIZEM) infusion 5 mg/hr (11/15/21 2113)  ? piperacillin-tazobactam (ZOSYN)  IV 3.375 g (11/16/21 0251)  ? ?PRN Meds: ?acetaminophen **OR** acetaminophen, fentaNYL (SUBLIMAZE) injection ? ?Allergies:    ?Allergies  ?Allergen Reactions  ? Flagyl [Metronidazole]   ?  Classic side effect of severe nausea, unable to tolerate.   ? ? ?Social History:   ?Social History  ? ?Socioeconomic History  ? Marital status: Married  ?  Spouse name: Not on file  ? Number of children: 9  ? Years of education: Not on file  ? Highest education level: Not on file  ?Occupational History  ? Not on file  ?Tobacco Use  ? Smoking status: Former  ?  Packs/day: 0.25  ?  Years: 2.00  ?  Pack years: 0.50  ?  Types: Cigarettes  ?  Quit date: 08/03/1975  ?  Years since quitting: 46.3  ? Smokeless tobacco: Never   ?Substance and Sexual Activity  ? Alcohol use: No  ?  Alcohol/week: 0.0 standard drinks  ? Drug use: No  ? Sexual activity: Yes  ?  Birth control/protection: Post-menopausal  ?Other Topics Concern  ? Not on file  ?S

## 2021-11-16 NOTE — Plan of Care (Signed)
?  Problem: Education: ?Goal: Knowledge of General Education information will improve ?Description: Including pain rating scale, medication(s)/side effects and non-pharmacologic comfort measures ?Outcome: Progressing ?  ?Problem: Clinical Measurements: ?Goal: Ability to maintain clinical measurements within normal limits will improve ?Outcome: Progressing ?Goal: Will remain free from infection ?Outcome: Progressing ?Goal: Diagnostic test results will improve ?Outcome: Progressing ?Goal: Respiratory complications will improve ?Outcome: Progressing ?Goal: Cardiovascular complication will be avoided ?Outcome: Progressing ?  ?Problem: Pain Managment: ?Goal: General experience of comfort will improve ?Outcome: Progressing ?  ?Problem: Safety: ?Goal: Ability to remain free from injury will improve ?Outcome: Progressing ?  ?Problem: Skin Integrity: ?Goal: Risk for impaired skin integrity will decrease ?Outcome: Progressing ?  ?

## 2021-11-16 NOTE — TOC Progression Note (Signed)
?  Transition of Care (TOC) Screening Note ? ? ?Patient Details  ?Name: Daisy Black ?Date of Birth: 03-Nov-1950 ? ? ?Transition of Care (TOC) CM/SW Contact:    ?Shade Flood, LCSW ?Phone Number: ?11/16/2021, 10:46 AM ? ? ? ?Transition of Care Department [facility]) has reviewed patient and no TOC needs have been identified at this time. We will continue to monitor patient advancement through interdisciplinary progression rounds. If new patient transition needs arise, please place a TOC consult. ? ? ?

## 2021-11-16 NOTE — Progress Notes (Signed)
?PROGRESS NOTE ? ? ? ? ?Daisy Black, is a 71 y.o. female, DOB - [birth year], OQH:[id number] ? ?Admit date - 11/15/2021   Admitting Physician Bernadette Hoit, DO ? ?Outpatient Primary MD for the patient is Ludwig Clarks, FNP ? ?LOS - 1 ? ?Chief Complaint  ?Patient presents with  ? Atrial Fibrillation  ? Hip Pain  ?    ? ? ?Brief Narrative:  ? 71 y.o. female with medical history significant of CLL (Follows with Dr Grayland Ormond, [facility]), A-Fib on Eliquis, GERD, Hypothyroidism admitted with acute diverticulitis and also found to be in A-fib with RVR ?  ?-Assessment and Plan: ?1)PAF with RVR--- PTA patient was on flecanide '50mg'$  bid, lopressor '50mg'$  bid, eliquis '5mg'$  bid, ?-Rate control improved on IV Cardizem cardiology consult appreciated okay to transition back to Lopressor at reduced dose to 12.5 mg twice daily due to soft BP ?-Anticipate A-fib rate controlled continue to improve with improvement in diverticulitis/infection ? ?2)Acute Diverticulitis--CT shows diverticulitis type findings in the descending and sigmoid colon ?-Continue IV fentanyl as needed for pain control ?-IV Zosyn as ordered ? ?3) CLL--- patient with persistent leukocytosis ? ?4)GERD--continue Protonix ? ?5) hypothyroidism--- continue levothyroxine 125 mcg daily, check TSH ? ?6) chronic thrombocytopenia--- no obvious bleeding at this time, monitor closely especially while on Eliquis ? ?Disposition/Need for in-Hospital Stay- patient unable to be discharged at this time due to acute diverticulitis requiring IV antibiotics, afebrile with RVR initially required IV Cardizem for rate control ? ?Status is: Inpatient  ? ?Disposition: The patient is from: Home ?             Anticipated d/c is to: Home ?             Anticipated d/c date is: 2 days ?             Patient currently is not medically stable to d/c. ?Barriers: Not Clinically Stable-  ? ?Code Status :  -  Code Status: Full Code  ? ?Family Communication:   NA (patient is alert, awake and coherent)   ? ?DVT Prophylaxis  :   - SCDs   SCDs Start: 11/15/21 2357 ?apixaban (ELIQUIS) tablet 5 mg  ? ?Lab Results  ?Component Value Date  ? PLT 119 (L) 11/16/2021  ? ? ?Inpatient Medications ? ?Scheduled Meds: ? apixaban  5 mg Oral BID  ? Chlorhexidine Gluconate Cloth  6 each Topical Daily  ? flecainide  50 mg Oral BID  ? levothyroxine  125 mcg Oral Q0600  ? metoprolol tartrate  12.5 mg Oral TID  ? pantoprazole  40 mg Oral Daily  ? ?Continuous Infusions: ? sodium chloride 100 mL/hr at 11/16/21 1734  ? diltiazem (CARDIZEM) infusion Stopped (11/16/21 1215)  ? piperacillin-tazobactam (ZOSYN)  IV Stopped (11/16/21 1615)  ? ?PRN Meds:.acetaminophen **OR** acetaminophen, fentaNYL (SUBLIMAZE) injection ? ? ?Anti-infectives (From admission, onward)  ? ? Start     Dose/Rate Route Frequency Ordered Stop  ? 11/16/21 0330  piperacillin-tazobactam (ZOSYN) IVPB 3.375 g       ? 3.375 g ?12.5 mL/hr over 240 Minutes Intravenous Every 8 hours 11/16/21 0242    ? 11/15/21 2000  levofloxacin (LEVAQUIN) IVPB 750 mg       ? 750 mg ?100 mL/hr over 90 Minutes Intravenous  Once 11/15/21 1954 11/15/21 2330  ? ?  ?  ? ?Subjective: ?[name] today has no fevers, no emesis,  No chest pain,  - ?-Heart rate improving ?-Abdominal pain is not worse ? ?Objective: ?Vitals:  ?  11/16/21 1500 11/16/21 1600 11/16/21 1731 11/16/21 1745  ?BP: (!) 101/54 (!) 100/53 (!) 84/51   ?Pulse:  82 (!) 135 (!) 104  ?Resp: '19 15 19 17  '$ ?Temp:   97.9 ?F (36.6 ?C)   ?TempSrc:   Oral   ?SpO2: 97% 100% 98% 96%  ?Weight:      ?Height:      ? ? ?Intake/Output Summary (Last 24 hours) at 11/16/2021 1747 ?Last data filed at 11/16/2021 1734 ?Gross per 24 hour  ?Intake 1198.53 ml  ?Output --  ?Net 1198.53 ml  ? ?Filed Weights  ? 11/15/21 1501 11/16/21 0448  ?Weight: 111.1 kg 111.7 kg  ? ? ?Physical Exam ?Gen:- Awake Alert, in no acute distress ?HEENT:- [place].AT, No sclera icterus ?Neck-Supple Neck,No JVD,.  ?Lungs-  CTAB , fair symmetrical air movement ?CV- S1, S2 normal, irregularly  irregular ? abd-  +ve B.Sounds, Abd Soft, left-sided abdominal tenderness without rebound or guarding ?Extremity/Skin:- No  edema, pedal pulses present  ?Psych-affect is appropriate, oriented x3 ?Neuro-no new focal deficits, no tremors ? ?Data Reviewed: I have personally reviewed following labs and imaging studies ? ?CBC: ?Recent Labs  ?Lab 11/15/21 ?1733 11/16/21 ?0354  ?WBC 24.3* 17.6*  ?NEUTROABS 7.8*  --   ?HGB 12.5 11.8*  ?HCT 40.7 40.2  ?MCV 95.8 95.9  ?PLT 131* 119*  ? ?Basic Metabolic Panel: ?Recent Labs  ?Lab 11/15/21 ?1733 11/16/21 ?0354  ?NA 143 140  ?K 4.0 3.7  ?CL 112* 110  ?CO2 26 26  ?GLUCOSE 99 120*  ?BUN 23 18  ?CREATININE 0.75 0.69  ?CALCIUM 9.8 9.4  ?MG  --  2.2  ?PHOS  --  3.1  ? ?GFR: ?Estimated Creatinine Clearance: 83.1 mL/min (by C-G formula based on SCr of 0.69 mg/dL). ?Liver Function Tests: ?Recent Labs  ?Lab 11/15/21 ?1733 11/16/21 ?0354  ?AST 13* 13*  ?ALT 12 13  ?ALKPHOS 99 91  ?BILITOT 0.5 0.6  ?PROT 6.0* 5.8*  ?ALBUMIN 3.7 3.3*  ? ?Cardiac Enzymes: ?No results for input(s): CKTOTAL, CKMB, CKMBINDEX, TROPONINI in the last 168 hours. ?BNP (last 3 results) ?No results for input(s): PROBNP in the last 8760 hours. ?HbA1C: ?No results for input(s): HGBA1C in the last 72 hours. ?Sepsis Labs: ?'@LABRCNTIP'$ (procalcitonin:4,lacticidven:4) ?) ?Recent Results (from the past 240 hour(s))  ?MRSA Next Gen by PCR, Nasal     Status: None  ? Collection Time: 11/16/21 12:15 AM  ? Specimen: Nasal Mucosa; Nasal Swab  ?Result Value Ref Range Status  ? MRSA by PCR Next Gen NOT DETECTED NOT DETECTED Final  ?  Comment: (NOTE) ?The GeneXpert MRSA Assay (FDA approved for NASAL specimens only), ?is one component of a comprehensive MRSA colonization surveillance ?program. It is not intended to diagnose MRSA infection nor to guide ?or monitor treatment for MRSA infections. ?Test performance is not FDA approved in patients less than 2 years ?old. ?Performed at [facility], [address], [place], [place] [address] ?   ?Culture, blood (Routine X 2) w Reflex to ID Panel     Status: None (Preliminary result)  ? Collection Time: 11/16/21  3:54 AM  ? Specimen: BLOOD  ?Result Value Ref Range Status  ? Specimen Description BLOOD BLOOD RIGHT HAND  Final  ? Special Requests   Final  ?  BOTTLES DRAWN AEROBIC AND ANAEROBIC Blood Culture adequate volume ?Performed at [facility], [address] [place], [place] [address] ?  ? Culture PENDING  Incomplete  ? Report Status PENDING  Incomplete  ?  ? ? ?  Radiology Studies: ?CT Abdomen Pelvis W Contrast ? ?Result Date: 11/15/2021 ?CLINICAL DATA:  Left lower quadrant abdominal pain, nausea for 4 days, history of CLL EXAM: CT ABDOMEN AND PELVIS WITH CONTRAST TECHNIQUE: Multidetector CT imaging of the abdomen and pelvis was performed using the standard protocol following bolus administration of intravenous contrast. RADIATION DOSE REDUCTION: This exam was performed according to the departmental dose-optimization program which includes automated exposure control, adjustment of the mA and/or kV according to patient size and/or use of iterative reconstruction technique. CONTRAST:  149m OMNIPAQUE IOHEXOL 300 MG/ML  SOLN COMPARISON:  06/28/2021, 01/11/2021 FINDINGS: Lower chest: No acute pleural or parenchymal lung disease. Hepatobiliary: Cholecystectomy. Liver is unremarkable. No biliary duct dilation. Pancreas: Unremarkable. No pancreatic ductal dilatation or surrounding inflammatory changes. Spleen: Stable splenomegaly, measuring 16.4 by 13.8 by 6.0 cm. No focal parenchymal abnormality. Adrenals/Urinary Tract: Adrenals are unremarkable. Kidneys enhance normally and symmetrically. No urinary tract calculi or obstructive uropathy. Bladder is minimally distended without focal abnormality. Stomach/Bowel: Scattered diverticulosis of the colon. Wall thickening and pericolonic fat stranding at the junction of the descending and sigmoid colon consistent with acute uncomplicated diverticulitis. No perforation,  fluid collection, or abscess. No bowel obstruction or ileus. Normal appendix right lower quadrant. Vascular/Lymphatic: No significant vascular findings. Lymphadenopathy throughout the abdomen and pelvis is

## 2021-11-17 DIAGNOSIS — I480 Paroxysmal atrial fibrillation: Secondary | ICD-10-CM | POA: Diagnosis not present

## 2021-11-17 LAB — RENAL FUNCTION PANEL
Albumin: 3 g/dL — ABNORMAL LOW (ref 3.5–5.0)
Anion gap: 4 — ABNORMAL LOW (ref 5–15)
BUN: 14 mg/dL (ref 8–23)
CO2: 28 mmol/L (ref 22–32)
Calcium: 9.1 mg/dL (ref 8.9–10.3)
Chloride: 111 mmol/L (ref 98–111)
Creatinine, Ser: 0.79 mg/dL (ref 0.44–1.00)
GFR, Estimated: >60 mL/min (ref >60)
Glucose, Bld: 117 mg/dL — ABNORMAL HIGH (ref 70–99)
Phosphorus: 3 mg/dL (ref 2.5–4.6)
Potassium: 3.8 mmol/L (ref 3.5–5.1)
Sodium: 143 mmol/L (ref 135–145)

## 2021-11-17 LAB — CBC
HCT: 36.8 % (ref 36.0–46.0)
Hemoglobin: 10.8 g/dL — ABNORMAL LOW (ref 12.0–15.0)
MCH: 28.4 pg (ref 26.0–34.0)
MCHC: 29.3 g/dL — ABNORMAL LOW (ref 30.0–36.0)
MCV: 96.8 fL (ref 80.0–100.0)
Platelets: 119 10*3/uL — ABNORMAL LOW (ref 150–400)
RBC: 3.8 MIL/uL — ABNORMAL LOW (ref 3.87–5.11)
RDW: 15 % (ref 11.5–15.5)
WBC: 15 10*3/uL — ABNORMAL HIGH (ref 4.0–10.5)
nRBC: 0 % (ref 0.0–0.2)

## 2021-11-17 LAB — TSH: TSH: 1.987 u[IU]/mL (ref 0.350–4.500)

## 2021-11-17 MED ORDER — METOPROLOL TARTRATE 25 MG PO TABS
25.0000 mg | ORAL_TABLET | Freq: Three times a day (TID) | ORAL | Status: DC
  Administered 2021-11-17 – 2021-11-23 (×17): 25 mg via ORAL
  Filled 2021-11-17 (×21): qty 1

## 2021-11-17 MED ORDER — FLECAINIDE ACETATE 50 MG PO TABS
75.0000 mg | ORAL_TABLET | Freq: Two times a day (BID) | ORAL | Status: DC
  Administered 2021-11-17 – 2021-11-20 (×6): 75 mg via ORAL
  Filled 2021-11-17 (×10): qty 2

## 2021-11-17 NOTE — Progress Notes (Signed)
?PROGRESS NOTE ? ? ? ? ?Daisy Black, is a 71 y.o. female, DOB - 1950-08-16, PIR:[id number] ? ?Admit date - 11/15/2021   Admitting Physician Bernadette Hoit, DO ? ?Outpatient Primary MD for the patient is Ludwig Clarks, FNP ? ?LOS - 2 ? ?Chief Complaint  ?Patient presents with  ? Atrial Fibrillation  ? Hip Pain  ?    ? ? ?Brief Narrative:  ? 71 y.o. female with medical history significant of CLL (Follows with Dr Grayland Ormond, [facility]), A-Fib on Eliquis, GERD, Hypothyroidism admitted with acute diverticulitis and also found to be in A-fib with RVR ?  ?-Assessment and Plan: ?1)PAF with RVR--- PTA patient was on flecanide '50mg'$  bid, lopressor '50mg'$  bid, eliquis '5mg'$  bid, ?-Rate control improved on IV Cardizem cardiology consult appreciated okay to transition back to Lopressor  ?--Anticipate A-fib rate controlled continue to improve with improvement in diverticulitis/infection ?-Given persistent tachycardia cardiologist as increased metoprolol to 25 mg 3 times daily for now ?- continue flecainide ? ?2)Acute Diverticulitis--CT shows diverticulitis type findings in the descending and sigmoid colon ?-Continue IV fentanyl as needed for pain control ?-IV Zosyn as ordered ? ?3) CLL--- patient with persistent leukocytosis ? ?4)GERD--continue Protonix ? ?5) hypothyroidism--- continue levothyroxine 125 mcg daily,   ?-TSH is 1.9 ? ?6) chronic thrombocytopenia--- no obvious bleeding at this time, monitor closely especially while on Eliquis ? ?Disposition/Need for in-Hospital Stay- patient unable to be discharged at this time due to acute diverticulitis requiring IV antibiotics, afebrile with RVR initially required IV Cardizem for rate control ?-May require cardioversion if unable to rate control with oral agents ? ?Status is: Inpatient  ? ?Disposition: The patient is from: Home ?             Anticipated d/c is to: Home ?             Anticipated d/c date is: 2 days ?             Patient currently is not medically stable to  d/c. ?Barriers: Not Clinically Stable-  ? ?Code Status :  -  Code Status: Full Code  ? ?Family Communication:   NA (patient is alert, awake and coherent)  ? ?DVT Prophylaxis  :   - SCDs   SCDs Start: 11/15/21 2357 ?apixaban (ELIQUIS) tablet 5 mg  ? ?Lab Results  ?Component Value Date  ? PLT 119 (L) 11/17/2021  ? ? ?Inpatient Medications ? ?Scheduled Meds: ? apixaban  5 mg Oral BID  ? Chlorhexidine Gluconate Cloth  6 each Topical Daily  ? flecainide  75 mg Oral BID  ? levothyroxine  125 mcg Oral Q0600  ? metoprolol tartrate  25 mg Oral TID  ? pantoprazole  40 mg Oral Daily  ? ?Continuous Infusions: ? sodium chloride 50 mL/hr at 11/16/21 1830  ? piperacillin-tazobactam (ZOSYN)  IV 3.375 g (11/17/21 1318)  ? ?PRN Meds:.acetaminophen **OR** acetaminophen, fentaNYL (SUBLIMAZE) injection ? ? ?Anti-infectives (From admission, onward)  ? ? Start     Dose/Rate Route Frequency Ordered Stop  ? 11/16/21 0330  piperacillin-tazobactam (ZOSYN) IVPB 3.375 g       ? 3.375 g ?12.5 mL/hr over 240 Minutes Intravenous Every 8 hours 11/16/21 0242    ? 11/15/21 2000  levofloxacin (LEVAQUIN) IVPB 750 mg       ? 750 mg ?100 mL/hr over 90 Minutes Intravenous  Once 11/15/21 1954 11/15/21 2330  ? ?  ?  ? ?Subjective: ?Daisy Black today has no fevers, no emesis,  No chest pain,  - ?-  Tachycardia persist abdominal pain improving, husband at bedside, questions answered ? ?Objective: ?Vitals:  ? 11/17/21 1600 11/17/21 1711 11/17/21 1800 11/17/21 1900  ?BP:  (!) 83/58 (!) 94/59 (!) 131/108  ?Pulse: 98 (!) 38 (!) 104 (!) 134  ?Resp: 10 15 (!) 24 13  ?Temp: 98 ?F (36.7 ?C)     ?TempSrc: Oral     ?SpO2: 96% 98% 97% 98%  ?Weight:      ?Height:      ? ? ?Intake/Output Summary (Last 24 hours) at 11/17/2021 1954 ?Last data filed at 11/17/2021 1700 ?Gross per 24 hour  ?Intake 970.85 ml  ?Output 1 ml  ?Net 969.85 ml  ? ?Filed Weights  ? 11/15/21 1501 11/16/21 0448 11/17/21 0419  ?Weight: 111.1 kg 111.7 kg 117.2 kg  ? ? ?Physical Exam ?Gen:- Awake Alert, in  no acute distress ?HEENT:- [place].AT, No sclera icterus ?Neck-Supple Neck,No JVD,.  ?Lungs-  CTAB , fair symmetrical air movement ?CV- S1, S2 normal, irregularly irregular and tachycardic ? abd-  +ve B.Sounds, Abd Soft, improving left-sided abdominal tenderness without rebound or guarding ?Extremity/Skin:- No  edema, pedal pulses present  ?Psych-affect is appropriate, oriented x3 ?Neuro-no new focal deficits, no tremors ? ?Data Reviewed: I have personally reviewed following labs and imaging studies ? ?CBC: ?Recent Labs  ?Lab 11/15/21 ?1733 11/16/21 ?0354 11/17/21 ?9163  ?WBC 24.3* 17.6* 15.0*  ?NEUTROABS 7.8*  --   --   ?HGB 12.5 11.8* 10.8*  ?HCT 40.7 40.2 36.8  ?MCV 95.8 95.9 96.8  ?PLT 131* 119* 119*  ? ?Basic Metabolic Panel: ?Recent Labs  ?Lab 11/15/21 ?1733 11/16/21 ?0354 11/17/21 ?8466  ?NA 143 140 143  ?K 4.0 3.7 3.8  ?CL 112* 110 111  ?CO2 '26 26 28  '$ ?GLUCOSE 99 120* 117*  ?BUN '23 18 14  '$ ?CREATININE 0.75 0.69 0.79  ?CALCIUM 9.8 9.4 9.1  ?MG  --  2.2  --   ?PHOS  --  3.1 3.0  ? ?GFR: ?Estimated Creatinine Clearance: 85.3 mL/min (by C-G formula based on SCr of 0.79 mg/dL). ?Liver Function Tests: ?Recent Labs  ?Lab 11/15/21 ?1733 11/16/21 ?0354 11/17/21 ?5993  ?AST 13* 13*  --   ?ALT 12 13  --   ?ALKPHOS 99 91  --   ?BILITOT 0.5 0.6  --   ?PROT 6.0* 5.8*  --   ?ALBUMIN 3.7 3.3* 3.0*  ? ?Cardiac Enzymes: ?No results for input(s): CKTOTAL, CKMB, CKMBINDEX, TROPONINI in the last 168 hours. ?BNP (last 3 results) ?No results for input(s): PROBNP in the last 8760 hours. ?HbA1C: ?No results for input(s): HGBA1C in the last 72 hours. ?Sepsis Labs: ?'@LABRCNTIP'$ (procalcitonin:4,lacticidven:4) ?) ?Recent Results (from the past 240 hour(s))  ?MRSA Next Gen by PCR, Nasal     Status: None  ? Collection Time: 11/16/21 12:15 AM  ? Specimen: Nasal Mucosa; Nasal Swab  ?Result Value Ref Range Status  ? MRSA by PCR Next Gen NOT DETECTED NOT DETECTED Final  ?  Comment: (NOTE) ?The GeneXpert MRSA Assay (FDA approved for NASAL specimens  only), ?is one component of a comprehensive MRSA colonization surveillance ?program. It is not intended to diagnose MRSA infection nor to guide ?or monitor treatment for MRSA infections. ?Test performance is not FDA approved in patients less than 2 years ?old. ?Performed at [facility], [address], [place], [place] [address] ?  ?Culture, blood (Routine X 2) w Reflex to ID Panel     Status: None (Preliminary result)  ? Collection Time: 11/16/21  3:54 AM  ?  Specimen: BLOOD  ?Result Value Ref Range Status  ? Specimen Description BLOOD BLOOD RIGHT HAND  Final  ? Special Requests   Final  ?  BOTTLES DRAWN AEROBIC AND ANAEROBIC Blood Culture adequate volume  ? Culture   Final  ?  NO GROWTH 1 DAY ?Performed at [facility], [address] [place], [place] [address] ?  ? Report Status PENDING  Incomplete  ?Culture, blood (Routine X 2) w Reflex to ID Panel     Status: None (Preliminary result)  ? Collection Time: 11/16/21  3:54 AM  ? Specimen: BLOOD  ?Result Value Ref Range Status  ? Specimen Description BLOOD  Final  ? Special Requests NONE  Final  ? Culture   Final  ?  NO GROWTH 1 DAY ?Performed at [facility], [address] [place], [place] [address] ?  ? Report Status PENDING  Incomplete  ?  ? ? ?Radiology Studies: ?No results found. ? ? ?Scheduled Meds: ? apixaban  5 mg Oral BID  ? Chlorhexidine Gluconate Cloth  6 each Topical Daily  ? flecainide  75 mg Oral BID  ? levothyroxine  125 mcg Oral Q0600  ? metoprolol tartrate  25 mg Oral TID  ? pantoprazole  40 mg Oral Daily  ? ?Continuous Infusions: ? sodium chloride 50 mL/hr at 11/16/21 1830  ? piperacillin-tazobactam (ZOSYN)  IV 3.375 g (11/17/21 1318)  ? ? ? LOS: 2 days  ? ?Roxan Hockey M.D on 11/17/2021 at 7:54 PM ? ?Go to [email or web address] - for contact info ? ?[facility] - Office  [phone] ? ?If 7PM-7AM, please contact night-coverage ?[email or web address] ?Password [email or web address] ?11/17/2021, 7:54 PM  ? ? ?

## 2021-11-17 NOTE — Progress Notes (Addendum)
? ?Progress Note ? ?Patient Name: Daisy Black ?Date of Encounter: 11/17/2021 ? ?[facility] Cardiologist: Rozann Lesches, MD  ? ?Subjective  ? ?Reports her abdominal pain has improved. No chest pain or dyspnea. Still having some intermittent palpitations and HR has been variable from the low-100's to 110's but peaking into the 130's at times.  ? ?Inpatient Medications  ?  ?Scheduled Meds: ? apixaban  5 mg Oral BID  ? Chlorhexidine Gluconate Cloth  6 each Topical Daily  ? flecainide  50 mg Oral BID  ? levothyroxine  125 mcg Oral Q0600  ? metoprolol tartrate  25 mg Oral TID  ? pantoprazole  40 mg Oral Daily  ? ?Continuous Infusions: ? sodium chloride 50 mL/hr at 11/16/21 1830  ? piperacillin-tazobactam (ZOSYN)  IV 3.375 g (11/17/21 0519)  ? ?PRN Meds: ?acetaminophen **OR** acetaminophen, fentaNYL (SUBLIMAZE) injection  ? ?Vital Signs  ?  ?Vitals:  ? 11/17/21 0419 11/17/21 0703 11/17/21 0744 11/17/21 0750  ?BP:  (!) 157/124 (!) 89/57 (!) 94/59  ?Pulse:  (!) 128 87 80  ?Resp:  '19 16 17  '$ ?Temp: (!) 97.5 ?F (36.4 ?C)     ?TempSrc: Oral     ?SpO2:  97% 99% 99%  ?Weight: 117.2 kg     ?Height:      ? ? ?Intake/Output Summary (Last 24 hours) at 11/17/2021 0800 ?Last data filed at 11/17/2021 0300 ?Gross per 24 hour  ?Intake 1098.17 ml  ?Output --  ?Net 1098.17 ml  ? ? ?  11/17/2021  ?  4:19 AM 11/16/2021  ?  4:48 AM 11/15/2021  ?  3:01 PM  ?Last 3 Weights  ?Weight (lbs) 258 lb 6.1 oz 246 lb 4.1 oz 245 lb  ?Weight (kg) 117.2 kg 111.7 kg 111.131 kg  ?   ? ?Telemetry  ?  ?Atrial fibrillation, HR mostly in low-100's to 110's. Peaking into 130's.  - Personally Reviewed ? ?ECG  ?  ?No new tracings.  ? ?Physical Exam  ? ?GEN: Pleasant female appearing in no acute distress.   ?Neck: No JVD ?Cardiac: Irregularly irregular, no murmurs, rubs, or gallops.  ?Respiratory: Clear to auscultation bilaterally without wheezing or rales. ?GI: Soft, nontender, non-distended  ?MS: No pitting edema; No deformity. ?Neuro:  Nonfocal  ?Psych:  Normal affect  ? ?Labs  ?  ?High Sensitivity Troponin:  No results for input(s): TROPONINIHS in the last 720 hours.   ?Chemistry ?Recent Labs  ?Lab 11/15/21 ?1733 11/16/21 ?0354 11/17/21 ?6568  ?NA 143 140 143  ?K 4.0 3.7 3.8  ?CL 112* 110 111  ?CO2 '26 26 28  '$ ?GLUCOSE 99 120* 117*  ?BUN '23 18 14  '$ ?CREATININE 0.75 0.69 0.79  ?CALCIUM 9.8 9.4 9.1  ?MG  --  2.2  --   ?PROT 6.0* 5.8*  --   ?ALBUMIN 3.7 3.3* 3.0*  ?AST 13* 13*  --   ?ALT 12 13  --   ?ALKPHOS 99 91  --   ?BILITOT 0.5 0.6  --   ?GFRNONAA >60 >60 >60  ?ANIONGAP 5 4* 4*  ?  ?Lipids No results for input(s): CHOL, TRIG, HDL, LABVLDL, LDLCALC, CHOLHDL in the last 168 hours.  ?Hematology ?Recent Labs  ?Lab 11/15/21 ?1733 11/16/21 ?0354 11/17/21 ?1275  ?WBC 24.3* 17.6* 15.0*  ?RBC 4.25 4.19 3.80*  ?HGB 12.5 11.8* 10.8*  ?HCT 40.7 40.2 36.8  ?MCV 95.8 95.9 96.8  ?MCH 29.4 28.2 28.4  ?MCHC 30.7 29.4* 29.3*  ?RDW 15.3 15.1 15.0  ?PLT 131* 119*  119*  ? ?Thyroid  ?Recent Labs  ?Lab 11/17/21 ?1610  ?TSH 1.987  ?  ?BNPNo results for input(s): BNP, PROBNP in the last 168 hours.  ?DDimer No results for input(s): DDIMER in the last 168 hours.  ? ?Radiology  ?  ?CT Abdomen Pelvis W Contrast ? ?Result Date: 11/15/2021 ?CLINICAL DATA:  Left lower quadrant abdominal pain, nausea for 4 days, history of CLL EXAM: CT ABDOMEN AND PELVIS WITH CONTRAST TECHNIQUE: Multidetector CT imaging of the abdomen and pelvis was performed using the standard protocol following bolus administration of intravenous contrast. RADIATION DOSE REDUCTION: This exam was performed according to the departmental dose-optimization program which includes automated exposure control, adjustment of the mA and/or kV according to patient size and/or use of iterative reconstruction technique. CONTRAST:  188m OMNIPAQUE IOHEXOL 300 MG/ML  SOLN COMPARISON:  06/28/2021, 01/11/2021 FINDINGS: Lower chest: No acute pleural or parenchymal lung disease. Hepatobiliary: Cholecystectomy. Liver is unremarkable. No biliary duct  dilation. Pancreas: Unremarkable. No pancreatic ductal dilatation or surrounding inflammatory changes. Spleen: Stable splenomegaly, measuring 16.4 by 13.8 by 6.0 cm. No focal parenchymal abnormality. Adrenals/Urinary Tract: Adrenals are unremarkable. Kidneys enhance normally and symmetrically. No urinary tract calculi or obstructive uropathy. Bladder is minimally distended without focal abnormality. Stomach/Bowel: Scattered diverticulosis of the colon. Wall thickening and pericolonic fat stranding at the junction of the descending and sigmoid colon consistent with acute uncomplicated diverticulitis. No perforation, fluid collection, or abscess. No bowel obstruction or ileus. Normal appendix right lower quadrant. Vascular/Lymphatic: No significant vascular findings. Lymphadenopathy throughout the abdomen and pelvis is again identified. Index lymph nodes are as follows: Right upper quadrant, image 24/2, 17 mm.  Stable. Precaval, image 21/2, 14 mm.  Previously 12 mm. Gastrohepatic ligament, image 16/2, 14 mm.  Previously 11 mm. Right femoral, image 72/2, 15 mm.  Stable. Right inguinal, image 90/2, 11 mm.  Previously 14 mm. No new adenopathy. Reproductive: Uterus and bilateral adnexa are unremarkable. Other: No free fluid or free intraperitoneal gas. No abdominal wall hernia. Musculoskeletal: No acute or destructive bony lesions. Reconstructed images demonstrate no additional findings. IMPRESSION: 1. Acute uncomplicated diverticulitis at the junction of the descending and sigmoid colon. No perforation, fluid collection, or abscess. 2. Stable splenomegaly and diffuse intra-abdominal and intrapelvic lymphadenopathy, compatible with known history of CLL. Electronically Signed   By: MRanda NgoM.D.   On: 11/15/2021 19:28  ? ?DG Chest Port 1 View ? ?Result Date: 11/15/2021 ?CLINICAL DATA:  Atrial fibrillation EXAM: PORTABLE CHEST 1 VIEW COMPARISON:  None. FINDINGS: The heart size and mediastinal contours are within normal  limits. Both lungs are clear. The visualized skeletal structures are unremarkable. IMPRESSION: No active disease. Electronically Signed   By: PElmer PickerM.D.   On: 11/15/2021 17:48   ? ?Cardiac Studies  ? ?Echocardiogram: 06/2020 ?IMPRESSIONS  ? ? ? 1. Left ventricular ejection fraction, by estimation, is 60 to 65%. The  ?left ventricle has normal function. The left ventricle has no regional  ?wall motion abnormalities. There is mild left ventricular hypertrophy.  ?Left ventricular diastolic parameters  ?are consistent with Grade I diastolic dysfunction (impaired relaxation).  ? 2. Right ventricular systolic function is normal. The right ventricular  ?size is normal.  ? 3. The mitral valve is normal in structure. No evidence of mitral valve  ?regurgitation. No evidence of mitral stenosis.  ? 4. The aortic valve is tricuspid. Aortic valve regurgitation is not  ?visualized. No aortic stenosis is present.  ? 5. The inferior vena cava is normal in  size with greater than 50%  ?respiratory variability, suggesting right atrial pressure of 3 mmHg.  ? ?Comparison(s): Previous Echo showed LV EF 55-60% with Grade I diastolic  ?dysfunction.  ? ?Patient Profile  ?   ?71 y.o. female w/ PMH of paroxysmal atrial fibrillation (on Flecainide), HLD, GERD and CLL who is currently admitted for diverticulitis. Cardiology consulted due to atrial fibrillation with RVR.  ? ?Assessment & Plan  ?  ?1. Atrial Fibrillation with RVR ?- She has a known history of paroxysmal atrial fibrillation and has been on Flecainide '50mg'$  BID and Lopressor '50mg'$  BID as an outpatient but believes she went into atrial fibrillation approximately 1 week ago and suspect this was triggered by her diverticulitis.  ?-  She was on IV Cardizem yesterday but had hypotension with this and BP has been variable given her BP cuff size but SBP is in the 90's to low-100's. IV Cardizem has now been stopped and she was switched to Lopressor 12.'5mg'$  TID yesterday. Will  titrate to '25mg'$  TID with hold parameters in place. She was on Flecainide '50mg'$  BID prior to admission and this has been continued. Will review with Dr. Harrington Challenger in regards to trying possible pill-in-pocket dosing

## 2021-11-18 DIAGNOSIS — I480 Paroxysmal atrial fibrillation: Secondary | ICD-10-CM | POA: Diagnosis not present

## 2021-11-18 LAB — RENAL FUNCTION PANEL
Albumin: 3.2 g/dL — ABNORMAL LOW (ref 3.5–5.0)
Anion gap: 2 — ABNORMAL LOW (ref 5–15)
BUN: 11 mg/dL (ref 8–23)
CO2: 29 mmol/L (ref 22–32)
Calcium: 9.4 mg/dL (ref 8.9–10.3)
Chloride: 112 mmol/L — ABNORMAL HIGH (ref 98–111)
Creatinine, Ser: 0.75 mg/dL (ref 0.44–1.00)
GFR, Estimated: >60 mL/min (ref >60)
Glucose, Bld: 101 mg/dL — ABNORMAL HIGH (ref 70–99)
Phosphorus: 3.3 mg/dL (ref 2.5–4.6)
Potassium: 3.6 mmol/L (ref 3.5–5.1)
Sodium: 143 mmol/L (ref 135–145)

## 2021-11-18 LAB — CBC
HCT: 38.8 % (ref 36.0–46.0)
Hemoglobin: 11.6 g/dL — ABNORMAL LOW (ref 12.0–15.0)
MCH: 29.1 pg (ref 26.0–34.0)
MCHC: 29.9 g/dL — ABNORMAL LOW (ref 30.0–36.0)
MCV: 97.5 fL (ref 80.0–100.0)
Platelets: 132 10*3/uL — ABNORMAL LOW (ref 150–400)
RBC: 3.98 MIL/uL (ref 3.87–5.11)
RDW: 15.2 % (ref 11.5–15.5)
WBC: 16.8 10*3/uL — ABNORMAL HIGH (ref 4.0–10.5)
nRBC: 0 % (ref 0.0–0.2)

## 2021-11-18 LAB — MAGNESIUM: Magnesium: 2.2 mg/dL (ref 1.7–2.4)

## 2021-11-18 MED ORDER — OXYCODONE HCL 5 MG PO TABS
5.0000 mg | ORAL_TABLET | ORAL | Status: DC | PRN
  Administered 2021-11-23: 5 mg via ORAL
  Filled 2021-11-18 (×2): qty 1

## 2021-11-18 MED ORDER — BISACODYL 10 MG RE SUPP
10.0000 mg | Freq: Once | RECTAL | Status: AC
  Administered 2021-11-18: 10 mg via RECTAL
  Filled 2021-11-18: qty 1

## 2021-11-18 MED ORDER — POLYETHYLENE GLYCOL 3350 17 G PO PACK
17.0000 g | PACK | Freq: Two times a day (BID) | ORAL | Status: DC
  Administered 2021-11-18 – 2021-11-24 (×8): 17 g via ORAL
  Filled 2021-11-18 (×10): qty 1

## 2021-11-18 NOTE — Progress Notes (Signed)
?PROGRESS NOTE ? ? ? ? ?Daisy Black, is a 71 y.o. female, DOB - 1951-07-25, VVO:[id number] ? ?Admit date - 11/15/2021   Admitting Physician Bernadette Hoit, DO ? ?Outpatient Primary MD for the patient is Ludwig Clarks, FNP ? ?LOS - 3 ? ?Chief Complaint  ?Patient presents with  ? Atrial Fibrillation  ? Hip Pain  ?    ?Brief Narrative:  ? 71 y.o. female with medical history significant of CLL (Follows with Dr Grayland Ormond, [facility]), A-Fib on Eliquis, GERD, Hypothyroidism admitted with acute diverticulitis and also found to be in A-fib with RVR ?  ?-Assessment and Plan: ?1)PAF with RVR--- PTA patient was on flecanide '50mg'$  bid, lopressor '50mg'$  bid, eliquis '5mg'$  bid, ?-Rate control improved on IV Cardizem cardiology consult appreciated okay to transition back to Lopressor  ?--Anticipate A-fib rate controlled continue to improve with improvement in diverticulitis/infection ?-Given persistent tachycardia cardiologist as increased metoprolol to 25 mg 3 times daily for now ?- continue flecainide ?-Rate control remains very challenging ?-Patient may need TEE with cardioversion on Monday, 11/20/2021 ? ?2)Acute Diverticulitis--CT shows diverticulitis type findings in the descending and sigmoid colon ?-Persistent leukocytosis in the setting of CLL ?-Continue IV fentanyl as needed for pain control ?-IV Zosyn as ordered ? ?3) CLL--- patient with persistent leukocytosis ? ?4)GERD--continue Protonix ? ?5)Hypothyroidism--- continue levothyroxine 125 mcg daily,   ?-TSH is 1.9 ? ?6)Chronic Thrombocytopenia--- no obvious bleeding at this time,  ?-Platelets improving  ?-monitor closely especially while on Eliquis ? ?Disposition/Need for in-Hospital Stay- patient unable to be discharged at this time due to acute diverticulitis requiring IV antibiotics, afebrile with RVR initially required IV Cardizem for rate control ?--Rate control remains very challenging ?-Patient may need TEE with cardioversion on Monday, 11/20/2021 ? ?Status is: Inpatient   ? ?Disposition: The patient is from: Home ?             Anticipated d/c is to: Home ?             Anticipated d/c date is: 2 days ?             Patient currently is not medically stable to d/c. ?Barriers: Not Clinically Stable-  ? ?Code Status :  -  Code Status: Full Code  ? ?Family Communication:   NA (patient is alert, awake and coherent)  ? ?DVT Prophylaxis  :   - SCDs   SCDs Start: 11/15/21 2357 ?apixaban (ELIQUIS) tablet 5 mg  ? ?Lab Results  ?Component Value Date  ? PLT 132 (L) 11/18/2021  ? ?Inpatient Medications ? ?Scheduled Meds: ? apixaban  5 mg Oral BID  ? Chlorhexidine Gluconate Cloth  6 each Topical Daily  ? flecainide  75 mg Oral BID  ? levothyroxine  125 mcg Oral Q0600  ? metoprolol tartrate  25 mg Oral TID  ? pantoprazole  40 mg Oral Daily  ? polyethylene glycol  17 g Oral BID  ? ?Continuous Infusions: ? sodium chloride Stopped (11/18/21 1330)  ? piperacillin-tazobactam (ZOSYN)  IV 12.5 mL/hr at 11/18/21 1633  ? ?PRN Meds:.acetaminophen **OR** acetaminophen, fentaNYL (SUBLIMAZE) injection, oxyCODONE ? ? ?Anti-infectives (From admission, onward)  ? ? Start     Dose/Rate Route Frequency Ordered Stop  ? 11/16/21 0330  piperacillin-tazobactam (ZOSYN) IVPB 3.375 g       ? 3.375 g ?12.5 mL/hr over 240 Minutes Intravenous Every 8 hours 11/16/21 0242    ? 11/15/21 2000  levofloxacin (LEVAQUIN) IVPB 750 mg       ? 750 mg ?  100 mL/hr over 90 Minutes Intravenous  Once 11/15/21 1954 11/15/21 2330  ? ?  ?  ?Subjective: ?Daisy Black today has no fevers, no emesis,  No chest pain,  - ?-Tachycardia and dizziness persist ?-No significant dyspnea ?-Left-sided abdominal pain improving ? ?Objective: ?Vitals:  ? 11/18/21 1500 11/18/21 1512 11/18/21 1629 11/18/21 1640  ?BP: (!) 81/51 (!) 86/59 113/70   ?Pulse: 90 93 (!) 128   ?Resp: 19 19 (!) 23   ?Temp:    98 ?F (36.7 ?C)  ?TempSrc:    Oral  ?SpO2: 97% 95% 98%   ?Weight:      ?Height:      ? ? ?Intake/Output Summary (Last 24 hours) at 11/18/2021 1723 ?Last data filed  at 11/18/2021 1633 ?Gross per 24 hour  ?Intake 608.13 ml  ?Output --  ?Net 608.13 ml  ? ?Filed Weights  ? 11/15/21 1501 11/16/21 0448 11/17/21 0419  ?Weight: 111.1 kg 111.7 kg 117.2 kg  ? ? ?Physical Exam ?Gen:- Awake Alert, in no acute distress ?HEENT:- [place].AT, No sclera icterus ?Neck-Supple Neck,No JVD,.  ?Lungs-  CTAB , fair symmetrical air movement ?CV- S1, S2 normal, irregularly irregular and tachycardic ? abd-  +ve B.Sounds, Abd Soft, improving left-sided abdominal tenderness without rebound or guarding ?Extremity/Skin:- No  edema, pedal pulses present  ?Psych-affect is appropriate, oriented x3 ?Neuro-no new focal deficits, no tremors ? ?Data Reviewed: I have personally reviewed following labs and imaging studies ? ?CBC: ?Recent Labs  ?Lab 11/15/21 ?1733 11/16/21 ?0354 11/17/21 ?3335 11/18/21 ?0411  ?WBC 24.3* 17.6* 15.0* 16.8*  ?NEUTROABS 7.8*  --   --   --   ?HGB 12.5 11.8* 10.8* 11.6*  ?HCT 40.7 40.2 36.8 38.8  ?MCV 95.8 95.9 96.8 97.5  ?PLT 131* 119* 119* 132*  ? ?Basic Metabolic Panel: ?Recent Labs  ?Lab 11/15/21 ?1733 11/16/21 ?0354 11/17/21 ?4562 11/18/21 ?0411  ?NA 143 140 143 143  ?K 4.0 3.7 3.8 3.6  ?CL 112* 110 111 112*  ?CO2 '26 26 28 29  '$ ?GLUCOSE 99 120* 117* 101*  ?BUN '23 18 14 11  '$ ?CREATININE 0.75 0.69 0.79 0.75  ?CALCIUM 9.8 9.4 9.1 9.4  ?MG  --  2.2  --  2.2  ?PHOS  --  3.1 3.0 3.3  ? ?GFR: ?Estimated Creatinine Clearance: 85.3 mL/min (by C-G formula based on SCr of 0.75 mg/dL). ?Liver Function Tests: ?Recent Labs  ?Lab 11/15/21 ?1733 11/16/21 ?0354 11/17/21 ?5638 11/18/21 ?0411  ?AST 13* 13*  --   --   ?ALT 12 13  --   --   ?ALKPHOS 99 91  --   --   ?BILITOT 0.5 0.6  --   --   ?PROT 6.0* 5.8*  --   --   ?ALBUMIN 3.7 3.3* 3.0* 3.2*  ? ?Cardiac Enzymes: ?No results for input(s): CKTOTAL, CKMB, CKMBINDEX, TROPONINI in the last 168 hours. ?BNP (last 3 results) ?No results for input(s): PROBNP in the last 8760 hours. ?HbA1C: ?No results for input(s): HGBA1C in the last 72 hours. ?Sepsis  Labs: ?'@LABRCNTIP'$ (procalcitonin:4,lacticidven:4) ?) ?Recent Results (from the past 240 hour(s))  ?MRSA Next Gen by PCR, Nasal     Status: None  ? Collection Time: 11/16/21 12:15 AM  ? Specimen: Nasal Mucosa; Nasal Swab  ?Result Value Ref Range Status  ? MRSA by PCR Next Gen NOT DETECTED NOT DETECTED Final  ?  Comment: (NOTE) ?The GeneXpert MRSA Assay (FDA approved for NASAL specimens only), ?is one component of a comprehensive MRSA colonization surveillance ?program. It  is not intended to diagnose MRSA infection nor to guide ?or monitor treatment for MRSA infections. ?Test performance is not FDA approved in patients less than 2 years ?old. ?Performed at [facility], [address] [place], [place] [address] ?  ?Culture, blood (Routine X 2) w Reflex to ID Panel     Status: None (Preliminary result)  ? Collection Time: 11/16/21  3:54 AM  ? Specimen: BLOOD  ?Result Value Ref Range Status  ? Specimen Description BLOOD BLOOD RIGHT HAND  Final  ? Special Requests   Final  ?  BOTTLES DRAWN AEROBIC AND ANAEROBIC Blood Culture adequate volume  ? Culture   Final  ?  NO GROWTH 2 DAYS ?Performed at [facility], [address], [place], [place] [address] ?  ? Report Status PENDING  Incomplete  ?Culture, blood (Routine X 2) w Reflex to ID Panel     Status: None (Preliminary result)  ? Collection Time: 11/16/21  3:54 AM  ? Specimen: BLOOD  ?Result Value Ref Range Status  ? Specimen Description BLOOD  Final  ? Special Requests NONE  Final  ? Culture   Final  ?  NO GROWTH 2 DAYS ?Performed at [facility], [address] [place], [place] [address] ?  ? Report Status PENDING  Incomplete  ?  ?Radiology Studies: ?No results found. ? ? ?Scheduled Meds: ? apixaban  5 mg Oral BID  ? Chlorhexidine Gluconate Cloth  6 each Topical Daily  ? flecainide  75 mg Oral BID  ? levothyroxine  125 mcg Oral Q0600  ? metoprolol tartrate  25 mg Oral TID  ? pantoprazole  40 mg Oral Daily  ? polyethylene glycol  17 g Oral BID  ? ?Continuous Infusions: ?  sodium chloride Stopped (11/18/21 1330)  ? piperacillin-tazobactam (ZOSYN)  IV 12.5 mL/hr at 11/18/21 1633  ? ? ? LOS: 3 days  ? ?Roxan Hockey M.D on 11/18/2021 at 5:23 PM ? ?Go to [email or web address] - for contact info

## 2021-11-18 NOTE — Progress Notes (Signed)
Pharmacy Antibiotic Note ? ?Daisy Black is a 71 y.o. female admitted on 11/15/2021 with  intra-abdomina infection .  Pharmacy has been consulted for Zosyn dosing. WBC elevated. Renal function ok.  ? ?Plan: ?Zosyn 3.375G IV q8h to be infused over 4 hours ?Trend WBC, temp, renal function  ?F/U infectious work-up ? ?Height: '5\' 7"'$  (170.2 cm) ?Weight: 117.2 kg (258 lb 6.1 oz) ?IBW/kg (Calculated) : 61.6 ? ?Temp (24hrs), Avg:97.7 ?F (36.5 ?C), Min:97.4 ?F (36.3 ?C), Max:98 ?F (36.7 ?C) ? ?Recent Labs  ?Lab 11/15/21 ?1733 11/16/21 ?0354 11/17/21 ?1607 11/18/21 ?0411  ?WBC 24.3* 17.6* 15.0* 16.8*  ?CREATININE 0.75 0.69 0.79 0.75  ? ?  ?Estimated Creatinine Clearance: 85.3 mL/min (by C-G formula based on SCr of 0.75 mg/dL).   ? ?Allergies  ?Allergen Reactions  ? Flagyl [Metronidazole]   ?  Classic side effect of severe nausea, unable to tolerate.   ? ? ?Hart Robinsons, PharmD ?Clinical Pharmacist ? ? ?

## 2021-11-19 DIAGNOSIS — I480 Paroxysmal atrial fibrillation: Secondary | ICD-10-CM | POA: Diagnosis not present

## 2021-11-19 MED ORDER — TRAZODONE HCL 50 MG PO TABS
50.0000 mg | ORAL_TABLET | Freq: Every day | ORAL | Status: DC
  Administered 2021-11-19 – 2021-11-23 (×5): 50 mg via ORAL
  Filled 2021-11-19 (×5): qty 1

## 2021-11-19 MED ORDER — ALPRAZOLAM 0.5 MG PO TABS
0.5000 mg | ORAL_TABLET | Freq: Every evening | ORAL | Status: DC | PRN
  Administered 2021-11-23: 0.5 mg via ORAL
  Filled 2021-11-19: qty 1

## 2021-11-19 NOTE — Progress Notes (Signed)
?PROGRESS NOTE ? ? ? ? ?Daisy Black, is a 71 y.o. female, DOB - 16-Dec-1950, KYH:[id number] ? ?Admit date - 11/15/2021   Admitting Physician Daisy Hoit, DO ? ?Outpatient Primary MD for the patient is Ludwig Clarks, FNP ? ?LOS - 4 ? ?Chief Complaint  ?Patient presents with  ? Atrial Fibrillation  ? Hip Pain  ?    ?Brief Narrative:  ? 71 y.o. female with medical history significant of CLL (Follows with Dr Grayland Ormond, [facility]), A-Fib on Eliquis, GERD, Hypothyroidism admitted with acute diverticulitis and also found to be in A-fib with RVR ?  ?-Assessment and Plan: ?1)PAF with RVR--- PTA patient was on flecanide '50mg'$  bid, lopressor '50mg'$  bid, eliquis '5mg'$  bid, ?-Rate control improved on IV Cardizem cardiology consult appreciated okay to transition back to Lopressor  ?-Given persistent tachycardia cardiologist as increased metoprolol to 25 mg 3 times daily for now ?- continue flecainide ?---Rate control remains very challenging ?-Unable to titrate metoprolol further due to soft BP ?-Anticipate TEE with cardioversion on Monday, 11/20/2021 ? ?2)Acute Diverticulitis--CT shows diverticulitis type findings in the descending and sigmoid colon ?-Persistent leukocytosis in the setting of CLL ?-Continue IV fentanyl as needed for pain control ?-IV Zosyn as ordered ? ?3) CLL--- patient with persistent leukocytosis ? ?4)GERD--continue Protonix ? ?5)Hypothyroidism--- continue levothyroxine 125 mcg daily,   ?-TSH is 1.9 ? ?6)Chronic Thrombocytopenia--- no obvious bleeding at this time,  ?-Platelets improving  ?-monitor closely especially while on Eliquis ? ?Disposition/Need for in-Hospital Stay- patient unable to be discharged at this time due to acute diverticulitis requiring IV antibiotics, afebrile with RVR initially required IV Cardizem for rate control ?--Rate control remains very challenging ?-Anticipate TEE with cardioversion on Monday, 11/20/2021 ? ?Status is: Inpatient  ? ?Disposition: The patient is from: Home ?              Anticipated d/c is to: Home ?             Anticipated d/c date is: 2 days ?             Patient currently is not medically stable to d/c. ?Barriers: Not Clinically Stable-  ? ?Code Status :  -  Code Status: Full Code  ? ?Family Communication:   NA (patient is alert, awake and coherent)  ? ?DVT Prophylaxis  :   - SCDs   SCDs Start: 11/15/21 2357 ?apixaban (ELIQUIS) tablet 5 mg  ? ?Lab Results  ?Component Value Date  ? PLT 132 (L) 11/18/2021  ? ?Inpatient Medications ? ?Scheduled Meds: ? apixaban  5 mg Oral BID  ? Chlorhexidine Gluconate Cloth  6 each Topical Daily  ? flecainide  75 mg Oral BID  ? levothyroxine  125 mcg Oral Q0600  ? metoprolol tartrate  25 mg Oral TID  ? pantoprazole  40 mg Oral Daily  ? polyethylene glycol  17 g Oral BID  ? ?Continuous Infusions: ? sodium chloride 50 mL/hr at 11/19/21 0729  ? piperacillin-tazobactam (ZOSYN)  IV 3.375 g (11/19/21 0618)  ? ?PRN Meds:.acetaminophen **OR** acetaminophen, fentaNYL (SUBLIMAZE) injection, oxyCODONE ? ? ?Anti-infectives (From admission, onward)  ? ? Start     Dose/Rate Route Frequency Ordered Stop  ? 11/16/21 0330  piperacillin-tazobactam (ZOSYN) IVPB 3.375 g       ? 3.375 g ?12.5 mL/hr over 240 Minutes Intravenous Every 8 hours 11/16/21 0242    ? 11/15/21 2000  levofloxacin (LEVAQUIN) IVPB 750 mg       ? 750 mg ?100 mL/hr over 90 Minutes  Intravenous  Once 11/15/21 1954 11/15/21 2330  ? ?  ?  ?Subjective: ?[name] today has no fevers, no emesis,  No chest pain,  - ? ?-Tolerating oral intake ?-Palpitations and tachycardia persist with dizziness ?-Voiding well ? ?Objective: ?Vitals:  ? 11/19/21 0600 11/19/21 0727 11/19/21 0800 11/19/21 1131  ?BP: 103/70  (!) 104/53   ?Pulse: (!) 110 (!) 102 (!) 119 84  ?Resp: 14 17 (!) 24 18  ?Temp:  (!) 97.4 ?F (36.3 ?C)  (!) 97.4 ?F (36.3 ?C)  ?TempSrc:  Oral  Oral  ?SpO2: 97% 98% 98% 96%  ?Weight:      ?Height:      ? ? ?Intake/Output Summary (Last 24 hours) at 11/19/2021 1405 ?Last data filed at 11/19/2021  0800 ?Gross per 24 hour  ?Intake 308.13 ml  ?Output --  ?Net 308.13 ml  ? ?Filed Weights  ? 11/15/21 1501 11/16/21 0448 11/17/21 0419  ?Weight: 111.1 kg 111.7 kg 117.2 kg  ? ? ?Physical Exam ?Gen:- Awake Alert, in no acute distress ?HEENT:- [place].AT, No sclera icterus ?Neck-Supple Neck,No JVD,.  ?Lungs-  CTAB , fair symmetrical air movement ?CV- S1, S2 normal, irregularly irregular and tachycardic ? abd-  +ve B.Sounds, Abd Soft, improving left-sided abdominal tenderness without rebound or guarding ?Extremity/Skin:- No  edema, pedal pulses present  ?Psych-affect is appropriate, oriented x3 ?Neuro-no new focal deficits, no tremors ? ?Data Reviewed: I have personally reviewed following labs and imaging studies ? ?CBC: ?Recent Labs  ?Lab 11/15/21 ?1733 11/16/21 ?0354 11/17/21 ?2542 11/18/21 ?0411  ?WBC 24.3* 17.6* 15.0* 16.8*  ?NEUTROABS 7.8*  --   --   --   ?HGB 12.5 11.8* 10.8* 11.6*  ?HCT 40.7 40.2 36.8 38.8  ?MCV 95.8 95.9 96.8 97.5  ?PLT 131* 119* 119* 132*  ? ?Basic Metabolic Panel: ?Recent Labs  ?Lab 11/15/21 ?1733 11/16/21 ?0354 11/17/21 ?7062 11/18/21 ?0411  ?NA 143 140 143 143  ?K 4.0 3.7 3.8 3.6  ?CL 112* 110 111 112*  ?CO2 '26 26 28 29  '$ ?GLUCOSE 99 120* 117* 101*  ?BUN '23 18 14 11  '$ ?CREATININE 0.75 0.69 0.79 0.75  ?CALCIUM 9.8 9.4 9.1 9.4  ?MG  --  2.2  --  2.2  ?PHOS  --  3.1 3.0 3.3  ? ?GFR: ?Estimated Creatinine Clearance: 85.3 mL/min (by C-G formula based on SCr of 0.75 mg/dL). ?Liver Function Tests: ?Recent Labs  ?Lab 11/15/21 ?1733 11/16/21 ?0354 11/17/21 ?3762 11/18/21 ?0411  ?AST 13* 13*  --   --   ?ALT 12 13  --   --   ?ALKPHOS 99 91  --   --   ?BILITOT 0.5 0.6  --   --   ?PROT 6.0* 5.8*  --   --   ?ALBUMIN 3.7 3.3* 3.0* 3.2*  ? ?Cardiac Enzymes: ?No results for input(s): CKTOTAL, CKMB, CKMBINDEX, TROPONINI in the last 168 hours. ?BNP (last 3 results) ?No results for input(s): PROBNP in the last 8760 hours. ?HbA1C: ?No results for input(s): HGBA1C in the last 72 hours. ?Sepsis  Labs: ?'@LABRCNTIP'$ (procalcitonin:4,lacticidven:4) ?) ?Recent Results (from the past 240 hour(s))  ?MRSA Next Gen by PCR, Nasal     Status: None  ? Collection Time: 11/16/21 12:15 AM  ? Specimen: Nasal Mucosa; Nasal Swab  ?Result Value Ref Range Status  ? MRSA by PCR Next Gen NOT DETECTED NOT DETECTED Final  ?  Comment: (NOTE) ?The GeneXpert MRSA Assay (FDA approved for NASAL specimens only), ?is one component of a comprehensive MRSA colonization surveillance ?  program. It is not intended to diagnose MRSA infection nor to guide ?or monitor treatment for MRSA infections. ?Test performance is not FDA approved in patients less than 2 years ?old. ?Performed at [facility], [address], [place], [place] [address] ?  ?Culture, blood (Routine X 2) w Reflex to ID Panel     Status: None (Preliminary result)  ? Collection Time: 11/16/21  3:54 AM  ? Specimen: BLOOD  ?Result Value Ref Range Status  ? Specimen Description BLOOD BLOOD RIGHT HAND  Final  ? Special Requests   Final  ?  BOTTLES DRAWN AEROBIC AND ANAEROBIC Blood Culture adequate volume  ? Culture   Final  ?  NO GROWTH 3 DAYS ?Performed at [facility], [address] [place], [place] [address] ?  ? Report Status PENDING  Incomplete  ?Culture, blood (Routine X 2) w Reflex to ID Panel     Status: None (Preliminary result)  ? Collection Time: 11/16/21  3:54 AM  ? Specimen: BLOOD  ?Result Value Ref Range Status  ? Specimen Description BLOOD  Final  ? Special Requests NONE  Final  ? Culture   Final  ?  NO GROWTH 3 DAYS ?Performed at [facility], [address], [place], [place] [address] ?  ? Report Status PENDING  Incomplete  ?  ?Radiology Studies: ?No results found. ? ? ?Scheduled Meds: ? apixaban  5 mg Oral BID  ? Chlorhexidine Gluconate Cloth  6 each Topical Daily  ? flecainide  75 mg Oral BID  ? levothyroxine  125 mcg Oral Q0600  ? metoprolol tartrate  25 mg Oral TID  ? pantoprazole  40 mg Oral Daily  ? polyethylene glycol  17 g Oral BID  ? ?Continuous Infusions: ?  sodium chloride 50 mL/hr at 11/19/21 0729  ? piperacillin-tazobactam (ZOSYN)  IV 3.375 g (11/19/21 0618)  ? ? ? LOS: 4 days  ? ?Roxan Hockey M.D on 11/19/2021 at 2:05 PM ? ?Go to [email or web address] - for contact info ? ?[facility] -

## 2021-11-20 ENCOUNTER — Encounter (HOSPITAL_COMMUNITY): Payer: Self-pay | Admitting: Internal Medicine

## 2021-11-20 ENCOUNTER — Encounter (HOSPITAL_COMMUNITY)
Admission: EM | Disposition: A | Discharge status: Home / Self Care | Payer: Self-pay | Source: Home / Self Care | Attending: Family Medicine

## 2021-11-20 ENCOUNTER — Inpatient Hospital Stay (HOSPITAL_COMMUNITY): Payer: Medicare Other | Admitting: Anesthesiology

## 2021-11-20 DIAGNOSIS — I480 Paroxysmal atrial fibrillation: Secondary | ICD-10-CM | POA: Diagnosis not present

## 2021-11-20 DIAGNOSIS — I959 Hypotension, unspecified: Secondary | ICD-10-CM

## 2021-11-20 DIAGNOSIS — E039 Hypothyroidism, unspecified: Secondary | ICD-10-CM

## 2021-11-20 DIAGNOSIS — Z87891 Personal history of nicotine dependence: Secondary | ICD-10-CM

## 2021-11-20 DIAGNOSIS — D638 Anemia in other chronic diseases classified elsewhere: Secondary | ICD-10-CM

## 2021-11-20 DIAGNOSIS — I4891 Unspecified atrial fibrillation: Secondary | ICD-10-CM

## 2021-11-20 DIAGNOSIS — I10 Essential (primary) hypertension: Secondary | ICD-10-CM

## 2021-11-20 HISTORY — PX: CARDIOVERSION: SHX1299

## 2021-11-20 LAB — BASIC METABOLIC PANEL
Anion gap: 3 — ABNORMAL LOW (ref 5–15)
BUN: 15 mg/dL (ref 8–23)
CO2: 27 mmol/L (ref 22–32)
Calcium: 9.6 mg/dL (ref 8.9–10.3)
Chloride: 114 mmol/L — ABNORMAL HIGH (ref 98–111)
Creatinine, Ser: 0.73 mg/dL (ref 0.44–1.00)
GFR, Estimated: >60 mL/min (ref >60)
Glucose, Bld: 109 mg/dL — ABNORMAL HIGH (ref 70–99)
Potassium: 4 mmol/L (ref 3.5–5.1)
Sodium: 144 mmol/L (ref 135–145)

## 2021-11-20 LAB — CBC
HCT: 37.3 % (ref 36.0–46.0)
Hemoglobin: 11.4 g/dL — ABNORMAL LOW (ref 12.0–15.0)
MCH: 29.1 pg (ref 26.0–34.0)
MCHC: 30.6 g/dL (ref 30.0–36.0)
MCV: 95.2 fL (ref 80.0–100.0)
Platelets: 131 10*3/uL — ABNORMAL LOW (ref 150–400)
RBC: 3.92 MIL/uL (ref 3.87–5.11)
RDW: 15.1 % (ref 11.5–15.5)
WBC: 18.7 10*3/uL — ABNORMAL HIGH (ref 4.0–10.5)
nRBC: 0 % (ref 0.0–0.2)

## 2021-11-20 LAB — PROTIME-INR
INR: 1.1 (ref 0.8–1.2)
Prothrombin Time: 14.4 seconds (ref 11.4–15.2)

## 2021-11-20 SURGERY — CARDIOVERSION
Anesthesia: General

## 2021-11-20 MED ORDER — CHLORHEXIDINE GLUCONATE 0.12 % MT SOLN
15.0000 mL | Freq: Once | OROMUCOSAL | Status: AC
  Administered 2021-11-20: 15 mL via OROMUCOSAL

## 2021-11-20 MED ORDER — SODIUM CHLORIDE 0.9 % IV SOLN: INTRAVENOUS | Status: DC

## 2021-11-20 MED ORDER — ORAL CARE MOUTH RINSE: 15.0000 mL | Freq: Once | OROMUCOSAL | Status: AC

## 2021-11-20 MED ORDER — PROPOFOL 10 MG/ML IV BOLUS
INTRAVENOUS | Status: DC | PRN
  Administered 2021-11-20: 100 mg via INTRAVENOUS

## 2021-11-20 MED ORDER — ATROPINE SULFATE 0.4 MG/ML IV SOLN
INTRAVENOUS | Status: AC
  Filled 2021-11-20: qty 1

## 2021-11-20 MED ORDER — AMIODARONE HCL 200 MG PO TABS
400.0000 mg | ORAL_TABLET | Freq: Two times a day (BID) | ORAL | Status: DC
  Administered 2021-11-21: 400 mg via ORAL
  Filled 2021-11-20 (×2): qty 2

## 2021-11-20 MED ORDER — LIDOCAINE HCL (PF) 2 % IJ SOLN
INTRAMUSCULAR | Status: AC
  Filled 2021-11-20: qty 5

## 2021-11-20 MED ORDER — LIDOCAINE 2% (20 MG/ML) 5 ML SYRINGE
INTRAMUSCULAR | Status: DC | PRN
  Administered 2021-11-20: 100 mg via INTRAVENOUS

## 2021-11-20 MED ORDER — PROPOFOL 10 MG/ML IV BOLUS
INTRAVENOUS | Status: AC
  Filled 2021-11-20: qty 20

## 2021-11-20 MED ORDER — LACTATED RINGERS IV SOLN: INTRAVENOUS | Status: DC

## 2021-11-20 NOTE — Progress Notes (Signed)
Transport staff present to take patient down for Cardioversion.  ?

## 2021-11-20 NOTE — Transfer of Care (Signed)
Immediate Anesthesia Transfer of Care Note ? ?Patient: Daisy Black ? ?Procedure(s) Performed: CARDIOVERSION ? ?Patient Location: PACU ? ?Anesthesia Type:General ? ?Level of Consciousness: drowsy ? ?Airway & Oxygen Therapy: Patient Spontanous Breathing and Patient connected to nasal cannula oxygen ? ?Post-op Assessment: Report given to RN and Post -op Vital signs reviewed and stable ? ?Post vital signs: Reviewed and stable ? ?Last Vitals:  ?Vitals Value Taken Time  ?BP    ?Temp    ?Pulse    ?Resp    ?SpO2    ? ? ?Last Pain:  ?Vitals:  ? 11/20/21 0955  ?TempSrc: Oral  ?PainSc: 0-No pain  ?   ? ?Patients Stated Pain Goal: 0 (11/17/21 2000) ? ?Complications: No notable events documented. ?

## 2021-11-20 NOTE — Anesthesia Preprocedure Evaluation (Signed)
Anesthesia Evaluation  ?Patient identified by MRN, date of birth, ID band ?Patient awake ? ? ? ?Reviewed: ?Allergy & Precautions, NPO status , Patient's Chart, lab work & pertinent test results, reviewed documented beta blocker date and time  ? ?Airway ?Mallampati: II ? ?TM Distance: >3 FB ?Neck ROM: Full ? ? ? Dental ? ?(+) Dental Advisory Given, Caps ?Crowns :   ?Pulmonary ?asthma , former smoker,  ?  ?Pulmonary exam normal ?breath sounds clear to auscultation ? ? ? ? ? ? Cardiovascular ?Exercise Tolerance: Good ?hypertension, Pt. on medications and Pt. on home beta blockers ?+ dysrhythmias Atrial Fibrillation  ?Rhythm:Irregular Rate:Tachycardia ? ? ?  ?Neuro/Psych ? Neuromuscular disease negative psych ROS  ? GI/Hepatic ?Neg liver ROS, hiatal hernia, GERD  Medicated and Controlled,  ?Endo/Other  ?Hypothyroidism Morbid obesity ? Renal/GU ?negative Renal ROS  ?negative genitourinary ?  ?Musculoskeletal ? ?(+) Arthritis , Osteoarthritis,   ? Abdominal ?  ?Peds ?negative pediatric ROS ?(+)  Hematology ? ?(+) Blood dyscrasia (CLL), anemia ,   ?Anesthesia Other Findings ? ? Reproductive/Obstetrics ?negative OB ROS ? ?  ? ? ? ? ? ? ? ? ? ? ? ? ? ?  ?  ? ? ? ? ? ? ? ? ?Anesthesia Physical ?Anesthesia Plan ? ?ASA: 3 ? ?Anesthesia Plan: General  ? ?Post-op Pain Management: Minimal or no pain anticipated  ? ?Induction: Intravenous ? ?PONV Risk Score and Plan: Propofol infusion ? ?Airway Management Planned: Nasal Cannula and Natural Airway ? ?Additional Equipment:  ? ?Intra-op Plan:  ? ?Post-operative Plan:  ? ?Informed Consent: I have reviewed the patients History and Physical, chart, labs and discussed the procedure including the risks, benefits and alternatives for the proposed anesthesia with the patient or authorized representative who has indicated his/her understanding and acceptance.  ? ? ? ?Dental advisory given ? ?Plan Discussed with: CRNA and Surgeon ? ?Anesthesia Plan Comments:    ? ? ? ? ? ? ?Anesthesia Quick Evaluation ? ?

## 2021-11-20 NOTE — Anesthesia Procedure Notes (Signed)
Date/Time: 11/20/2021 10:50 AM ?Performed by: Orlie Dakin, CRNA ?Pre-anesthesia Checklist: Patient identified, Emergency Drugs available, Suction available and Patient being monitored ?Patient Re-evaluated:Patient Re-evaluated prior to induction ?Oxygen Delivery Method: Nasal cannula ?Induction Type: IV induction ?Placement Confirmation: positive ETCO2 ? ? ? ? ?

## 2021-11-20 NOTE — Progress Notes (Signed)
? ?Progress Note ? ?Patient Name: Daisy Black ?Date of Encounter: 11/20/2021 ? ?Primary Cardiologist: Rozann Lesches, MD  ? ?Subjective  ? ?Abdominal pain has resolved. ?Still having symptomatic palpitations. ?Heart rates variable 90s to 120s with mild activity (sitting up). ?Denies any missed AC doses here or out of hospital. ?No planned surgeries. ?NPO since midnight. ? ?Inpatient Medications  ?  ?Scheduled Meds: ? apixaban  5 mg Oral BID  ? Chlorhexidine Gluconate Cloth  6 each Topical Daily  ? flecainide  75 mg Oral BID  ? levothyroxine  125 mcg Oral Q0600  ? metoprolol tartrate  25 mg Oral TID  ? pantoprazole  40 mg Oral Daily  ? polyethylene glycol  17 g Oral BID  ? traZODone  50 mg Oral QHS  ? ?Continuous Infusions: ? sodium chloride Stopped (11/19/21 2207)  ? piperacillin-tazobactam (ZOSYN)  IV 3.375 g (11/20/21 2671)  ? ?PRN Meds: ?acetaminophen **OR** acetaminophen, ALPRAZolam, fentaNYL (SUBLIMAZE) injection, oxyCODONE  ? ?Vital Signs  ?  ?Vitals:  ? 11/20/21 0400 11/20/21 0500 11/20/21 0718 11/20/21 0800  ?BP:   (!) 104/58 (!) 95/57  ?Pulse:   (!) 103 (!) 122  ?Resp:   (!) 31 12  ?Temp: 97.9 ?F (36.6 ?C)  97.6 ?F (36.4 ?C)   ?TempSrc: Oral  Axillary   ?SpO2:   100% 100%  ?Weight:  119.3 kg    ?Height:      ? ? ?Intake/Output Summary (Last 24 hours) at 11/20/2021 0905 ?Last data filed at 11/19/2021 2247 ?Gross per 24 hour  ?Intake 1167.43 ml  ?Output --  ?Net 1167.43 ml  ? ?Filed Weights  ? 11/16/21 0448 11/17/21 0419 11/20/21 0500  ?Weight: 111.7 kg 117.2 kg 119.3 kg  ? ? ?Telemetry  ?  ?AF RVR - Personally Reviewed ? ?Physical Exam  ? ?Gen: Mild distress, Morbid obesity ?Neck: No JVD ?Cardiac: No Rubs or Gallops,  no Murmur, IRIR tachycardia, no radial pulses ?Respiratory: Clear to auscultation bilaterally, normal effort, normal  respiratory rate ?GI: Soft, nontender, non-distended  ?MS: No  edema;  moves all extremities ?Integument: Skin feels well ?Neuro:  At time of evaluation, alert and oriented  to person/place/time/situation  ?Psych: Normal affect, patient feels poorly ? ? ?Labs  ?  ?Chemistry ?Recent Labs  ?Lab 11/15/21 ?1733 11/16/21 ?0354 11/17/21 ?2458 11/18/21 ?0411 11/20/21 ?0411  ?NA 143 140 143 143 144  ?K 4.0 3.7 3.8 3.6 4.0  ?CL 112* 110 111 112* 114*  ?CO2 '26 26 28 29 27  '$ ?GLUCOSE 99 120* 117* 101* 109*  ?BUN '23 18 14 11 15  '$ ?CREATININE 0.75 0.69 0.79 0.75 0.73  ?CALCIUM 9.8 9.4 9.1 9.4 9.6  ?PROT 6.0* 5.8*  --   --   --   ?ALBUMIN 3.7 3.3* 3.0* 3.2*  --   ?AST 13* 13*  --   --   --   ?ALT 12 13  --   --   --   ?ALKPHOS 99 91  --   --   --   ?BILITOT 0.5 0.6  --   --   --   ?GFRNONAA >60 >60 >60 >60 >60  ?ANIONGAP 5 4* 4* 2* 3*  ?  ? ?Hematology ?Recent Labs  ?Lab 11/17/21 ?0998 11/18/21 ?0411 11/20/21 ?0411  ?WBC 15.0* 16.8* 18.7*  ?RBC 3.80* 3.98 3.92  ?HGB 10.8* 11.6* 11.4*  ?HCT 36.8 38.8 37.3  ?MCV 96.8 97.5 95.2  ?MCH 28.4 29.1 29.1  ?MCHC 29.3* 29.9* 30.6  ?RDW 15.0 15.2  15.1  ?PLT 119* 132* 131*  ? ? ?Cardiac EnzymesNo results for input(s): TROPONINI in the last 168 hours. No results for input(s): TROPIPOC in the last 168 hours.  ? ?BNPNo results for input(s): BNP, PROBNP in the last 168 hours.  ? ?DDimer No results for input(s): DDIMER in the last 168 hours.  ? ?Radiology  ?  ?No results found. ? ?Patient Profile  ?   ?71 y.o. female PMH of paroxysmal atrial fibrillation (on Flecainide), HLD, GERD and CLL who is currently admitted for diverticulitis. Cardiology consulted due to atrial fibrillation with RVR.  ? ?Assessment & Plan  ?  ?Persistent Atrial Fibrillation ?Hypotension  ?CLL and Chronic Thrombocytopenia  ?Acute diverticulitis with no plans for procedures/surgery ?- Risk factors include Age, ?- CHADSVASC=3. ?- on flecainide 75 and lopressor 25 mg PO TID, unable to further tirtrate due to hypotension ?- on eliquis 5 mg PO BID with no missed doses ?- discussed with TRH, Patient and husband; consented for DCCV ? ?Will attempt to get on schedule today ? ?Risks and benefits of  cardioversion have been discussed with the patient.  These include arrhythmia, stroke, & death.  The patient understands these risks and is willing to proceed. ? ? ?For questions or updates, please contact [place] ?Please consult [email or web address] for contact info under Cardiology/STEMI. ?  ?   ?Signed, ?Werner Lean, MD  ?11/20/2021, 9:05 AM   ? ?

## 2021-11-20 NOTE — Progress Notes (Signed)
Electrical Cardioversion Procedure Note ?[name] ?[id number] ?1950-12-25 ? ?Procedure: Electrical Cardioversion ?Indications:  Atrial Fibrillation ? ?Procedure Details ?Consent: Risks of procedure as well as the alternatives and risks of each were explained to the (patient/caregiver).  Consent for procedure obtained. ?Time Out: Verified patient identification, verified procedure, site/side was marked, verified correct patient position, special equipment/implants available, medications/allergies/relevent history reviewed, required imaging and test results available.  Performed ? ?Patient placed on cardiac monitor, pulse oximetry, supplemental oxygen as necessary.  ?Sedation given:  propofol by P. Welty, CRNA ?Pacer pads placed anterior and posterior chest. Placed by Cardiologist ? ?Cardioverted 2 time(s).  ?Cardioverted at 200J. ? ?Evaluation ?Findings: Post procedure EKG shows: Atrial Fibrillation ?Complications: None ?Patient did tolerate procedure well. ? ? ?Charm Barges S ?11/20/2021, 11:14 AM ? ? ? ?

## 2021-11-20 NOTE — Progress Notes (Addendum)
?PROGRESS NOTE ? ? ? ? ?Daisy Black, is a 71 y.o. female, DOB - 1951/04/04, RCV:[id number] ? ?Admit date - 11/15/2021   Admitting Physician Bernadette Hoit, DO ? ?Outpatient Primary MD for the patient is Ludwig Clarks, FNP ? ?LOS - 5 ? ?Chief Complaint  ?Patient presents with  ? Atrial Fibrillation  ? Hip Pain  ?    ?Brief Narrative:  ? 71 y.o. female with medical history significant of CLL (Follows with Dr Grayland Ormond, [facility]), A-Fib on Eliquis, GERD, Hypothyroidism admitted with acute diverticulitis and also found to be in A-fib with RVR ?  ?-Assessment and Plan: ?1)PAF with RVR--- PTA patient was on flecanide '50mg'$  bid, lopressor '50mg'$  bid, eliquis '5mg'$  bid, ?---Rate control remains very challenging despite IV Cardizem and p.o. metoprolol and attempts at cardioversion on 11/20/2021 ?-Unable to titrate metoprolol further due to soft BP ?--11/20/2021 unsuccessful cardioversion despite multiple shocks remains in atrial fibrillation ?--Cardiologist recommends stopping flecainide and loading with amiodarone instead ? ?2)Acute Diverticulitis--CT shows diverticulitis type findings in the descending and sigmoid colon ?-Persistent leukocytosis in the setting of CLL ?-Continue IV fentanyl as needed for pain control ?-IV Zosyn as ordered ? ?3) CLL--- patient with persistent leukocytosis ? ?4)GERD--continue Protonix ? ?5)Hypothyroidism--- continue levothyroxine 125 mcg daily,   ?-TSH is 1.9 ? ?6)Chronic Thrombocytopenia--- no obvious bleeding at this time,  ?-Stable, ?-monitor closely especially while on Eliquis ? ?7) mild acute anemia--suspect hemodilutional component ?-No acute bleeding concerns ?-Monitor H&H ? ?Disposition/Need for in-Hospital Stay- patient unable to be discharged at this time due to acute diverticulitis requiring IV antibiotics, afebrile with RVR initially required IV Cardizem for rate control ?--Rate control remains very challenging ?-Failed electrical cardioversion, needs IV amiodarone load ? ?Status is:  Inpatient  ? ?Disposition: The patient is from: Home ?             Anticipated d/c is to: Home ?             Anticipated d/c date is: 2 days ?             Patient currently is not medically stable to d/c. ?Barriers: Not Clinically Stable-  ? ?Procedures:- ?-11/20/2021 unsuccessful cardioversion despite multiple shocks remains in atrial fibrillation ? ?Code Status :  -  Code Status: Full Code  ? ?Family Communication:    (patient is alert, awake and coherent)  ?-Discussed with husband at bedside ? ?DVT Prophylaxis  :   - SCDs   SCDs Start: 11/15/21 2357 ?apixaban (ELIQUIS) tablet 5 mg  ? ?Lab Results  ?Component Value Date  ? PLT 131 (L) 11/20/2021  ? ?Inpatient Medications ? ?Scheduled Meds: ? [START ON 11/21/2021] amiodarone  400 mg Oral BID  ? apixaban  5 mg Oral BID  ? Chlorhexidine Gluconate Cloth  6 each Topical Daily  ? levothyroxine  125 mcg Oral Q0600  ? metoprolol tartrate  25 mg Oral TID  ? pantoprazole  40 mg Oral Daily  ? polyethylene glycol  17 g Oral BID  ? traZODone  50 mg Oral QHS  ? ?Continuous Infusions: ? sodium chloride Stopped (11/19/21 2207)  ? piperacillin-tazobactam (ZOSYN)  IV Stopped (11/20/21 0930)  ? ?PRN Meds:.acetaminophen **OR** acetaminophen, ALPRAZolam, fentaNYL (SUBLIMAZE) injection, oxyCODONE ? ? ?Anti-infectives (From admission, onward)  ? ? Start     Dose/Rate Route Frequency Ordered Stop  ? 11/16/21 0330  piperacillin-tazobactam (ZOSYN) IVPB 3.375 g       ? 3.375 g ?12.5 mL/hr over 240 Minutes Intravenous Every 8 hours 11/16/21  0242    ? 11/15/21 2000  levofloxacin (LEVAQUIN) IVPB 750 mg       ? 750 mg ?100 mL/hr over 90 Minutes Intravenous  Once 11/15/21 1954 11/15/21 2330  ? ?  ?  ?Subjective: ?[name] today has no fevers, no emesis,  No chest pain,  - ? ?-Failed electrical cardioversion despite multiple shocks, ?-No chest pains ? ?Objective: ?Vitals:  ? 11/20/21 1100 11/20/21 1115 11/20/21 1135 11/20/21 1155  ?BP: (!) 92/57 105/64 108/78   ?Pulse: (!) 105 (!) 102  (!)  109  ?Resp: 17 19  (!) 27  ?Temp: 97.9 ?F (36.6 ?C)     ?TempSrc:      ?SpO2: 100% 100%  100%  ?Weight:      ?Height:      ? ? ?Intake/Output Summary (Last 24 hours) at 11/20/2021 1200 ?Last data filed at 11/20/2021 1051 ?Gross per 24 hour  ?Intake 1446.64 ml  ?Output --  ?Net 1446.64 ml  ? ?Filed Weights  ? 11/17/21 0419 11/20/21 0500 11/20/21 0955  ?Weight: 117.2 kg 119.3 kg 119.3 kg  ? ? ?Physical Exam ?Gen:- Awake Alert, in no acute distress ?HEENT:- [place].AT, No sclera icterus ?Neck-Supple Neck,No JVD,.  ?Lungs-  CTAB , fair symmetrical air movement ?CV- S1, S2 normal, irregularly irregular and tachycardic ? abd-  +ve B.Sounds, Abd Soft, improving left-sided abdominal tenderness without rebound or guarding ?Extremity/Skin:- No  edema, pedal pulses present  ?Psych-affect is appropriate, oriented x3 ?Neuro-no new focal deficits, no tremors ? ?Data Reviewed: I have personally reviewed following labs and imaging studies ? ?CBC: ?Recent Labs  ?Lab 11/15/21 ?1733 11/16/21 ?0354 11/17/21 ?1610 11/18/21 ?0411 11/20/21 ?0411  ?WBC 24.3* 17.6* 15.0* 16.8* 18.7*  ?NEUTROABS 7.8*  --   --   --   --   ?HGB 12.5 11.8* 10.8* 11.6* 11.4*  ?HCT 40.7 40.2 36.8 38.8 37.3  ?MCV 95.8 95.9 96.8 97.5 95.2  ?PLT 131* 119* 119* 132* 131*  ? ?Basic Metabolic Panel: ?Recent Labs  ?Lab 11/15/21 ?1733 11/16/21 ?0354 11/17/21 ?9604 11/18/21 ?0411 11/20/21 ?0411  ?NA 143 140 143 143 144  ?K 4.0 3.7 3.8 3.6 4.0  ?CL 112* 110 111 112* 114*  ?CO2 '26 26 28 29 27  '$ ?GLUCOSE 99 120* 117* 101* 109*  ?BUN '23 18 14 11 15  '$ ?CREATININE 0.75 0.69 0.79 0.75 0.73  ?CALCIUM 9.8 9.4 9.1 9.4 9.6  ?MG  --  2.2  --  2.2  --   ?PHOS  --  3.1 3.0 3.3  --   ? ?GFR: ?Estimated Creatinine Clearance: 86.2 mL/min (by C-G formula based on SCr of 0.73 mg/dL). ?Liver Function Tests: ?Recent Labs  ?Lab 11/15/21 ?1733 11/16/21 ?0354 11/17/21 ?5409 11/18/21 ?0411  ?AST 13* 13*  --   --   ?ALT 12 13  --   --   ?ALKPHOS 99 91  --   --   ?BILITOT 0.5 0.6  --   --   ?PROT 6.0* 5.8*   --   --   ?ALBUMIN 3.7 3.3* 3.0* 3.2*  ? ?Cardiac Enzymes: ?No results for input(s): CKTOTAL, CKMB, CKMBINDEX, TROPONINI in the last 168 hours. ?BNP (last 3 results) ?No results for input(s): PROBNP in the last 8760 hours. ?HbA1C: ?No results for input(s): HGBA1C in the last 72 hours. ?Sepsis Labs: ?'@LABRCNTIP'$ (procalcitonin:4,lacticidven:4) ?) ?Recent Results (from the past 240 hour(s))  ?MRSA Next Gen by PCR, Nasal     Status: None  ? Collection Time: 11/16/21 12:15 AM  ? Specimen:  Nasal Mucosa; Nasal Swab  ?Result Value Ref Range Status  ? MRSA by PCR Next Gen NOT DETECTED NOT DETECTED Final  ?  Comment: (NOTE) ?The GeneXpert MRSA Assay (FDA approved for NASAL specimens only), ?is one component of a comprehensive MRSA colonization surveillance ?program. It is not intended to diagnose MRSA infection nor to guide ?or monitor treatment for MRSA infections. ?Test performance is not FDA approved in patients less than 2 years ?old. ?Performed at [facility], [address], [place], [place] [address] ?  ?Culture, blood (Routine X 2) w Reflex to ID Panel     Status: None (Preliminary result)  ? Collection Time: 11/16/21  3:54 AM  ? Specimen: BLOOD  ?Result Value Ref Range Status  ? Specimen Description BLOOD BLOOD RIGHT HAND  Final  ? Special Requests   Final  ?  BOTTLES DRAWN AEROBIC AND ANAEROBIC Blood Culture adequate volume  ? Culture   Final  ?  NO GROWTH 3 DAYS ?Performed at [facility], [address] [place], [place] [address] ?  ? Report Status PENDING  Incomplete  ?Culture, blood (Routine X 2) w Reflex to ID Panel     Status: None (Preliminary result)  ? Collection Time: 11/16/21  3:54 AM  ? Specimen: BLOOD  ?Result Value Ref Range Status  ? Specimen Description BLOOD  Final  ? Special Requests NONE  Final  ? Culture   Final  ?  NO GROWTH 3 DAYS ?Performed at [facility], [address] [place], [place] [id number] ?  ? Report Status PENDING  Incomplete  ?  ?Radiology Studies: ?No results found. ? ? ?Scheduled  Meds: ? [START ON 11/21/2021] amiodarone  400 mg Oral BID  ? apixaban  5 mg Oral BID  ? Chlorhexidine Gluconate Cloth  6 each Topical Daily  ? levothyroxine  125 mcg Oral Q0600  ? metoprolol tartrate  25

## 2021-11-20 NOTE — Anesthesia Postprocedure Evaluation (Signed)
Anesthesia Post Note ? ?Patient: JALON BLACKWELDER ? ?Procedure(s) Performed: CARDIOVERSION ? ?Patient location during evaluation: PACU ?Anesthesia Type: General ?Level of consciousness: awake and alert and oriented ?Pain management: pain level controlled ?Vital Signs Assessment: post-procedure vital signs reviewed and stable ?Respiratory status: spontaneous breathing, nonlabored ventilation and respiratory function stable ?Cardiovascular status: blood pressure returned to baseline and stable ?Postop Assessment: no apparent nausea or vomiting ?Anesthetic complications: no ? ? ?No notable events documented. ? ? ?Last Vitals:  ?Vitals:  ? 11/20/21 1300 11/20/21 1305  ?BP: (!) 100/59   ?Pulse:  90  ?Resp: 19 14  ?Temp:    ?SpO2:  99%  ?  ?Last Pain:  ?Vitals:  ? 11/20/21 0955  ?TempSrc: Oral  ?PainSc: 0-No pain  ? ? ?  ?  ?  ?  ?  ?  ? ?Lanie Schelling C Bless Lisenby ? ? ? ? ?

## 2021-11-20 NOTE — H&P (View-Only) (Signed)
? ?Progress Note ? ?Patient Name: Daisy Black ?Date of Encounter: 11/20/2021 ? ?Primary Cardiologist: Rozann Lesches, MD  ? ?Subjective  ? ?Abdominal pain has resolved. ?Still having symptomatic palpitations. ?Heart rates variable 90s to 120s with mild activity (sitting up). ?Denies any missed AC doses here or out of hospital. ?No planned surgeries. ?NPO since midnight. ? ?Inpatient Medications  ?  ?Scheduled Meds: ? apixaban  5 mg Oral BID  ? Chlorhexidine Gluconate Cloth  6 each Topical Daily  ? flecainide  75 mg Oral BID  ? levothyroxine  125 mcg Oral Q0600  ? metoprolol tartrate  25 mg Oral TID  ? pantoprazole  40 mg Oral Daily  ? polyethylene glycol  17 g Oral BID  ? traZODone  50 mg Oral QHS  ? ?Continuous Infusions: ? sodium chloride Stopped (11/19/21 2207)  ? piperacillin-tazobactam (ZOSYN)  IV 3.375 g (11/20/21 8638)  ? ?PRN Meds: ?acetaminophen **OR** acetaminophen, ALPRAZolam, fentaNYL (SUBLIMAZE) injection, oxyCODONE  ? ?Vital Signs  ?  ?Vitals:  ? 11/20/21 0400 11/20/21 0500 11/20/21 0718 11/20/21 0800  ?BP:   (!) 104/58 (!) 95/57  ?Pulse:   (!) 103 (!) 122  ?Resp:   (!) 31 12  ?Temp: 97.9 ?F (36.6 ?C)  97.6 ?F (36.4 ?C)   ?TempSrc: Oral  Axillary   ?SpO2:   100% 100%  ?Weight:  119.3 kg    ?Height:      ? ? ?Intake/Output Summary (Last 24 hours) at 11/20/2021 0905 ?Last data filed at 11/19/2021 2247 ?Gross per 24 hour  ?Intake 1167.43 ml  ?Output --  ?Net 1167.43 ml  ? ?Filed Weights  ? 11/16/21 0448 11/17/21 0419 11/20/21 0500  ?Weight: 111.7 kg 117.2 kg 119.3 kg  ? ? ?Telemetry  ?  ?AF RVR - Personally Reviewed ? ?Physical Exam  ? ?Gen: Mild distress, Morbid obesity ?Neck: No JVD ?Cardiac: No Rubs or Gallops,  no Murmur, IRIR tachycardia, no radial pulses ?Respiratory: Clear to auscultation bilaterally, normal effort, normal  respiratory rate ?GI: Soft, nontender, non-distended  ?MS: No  edema;  moves all extremities ?Integument: Skin feels well ?Neuro:  At time of evaluation, alert and oriented  to person/place/time/situation  ?Psych: Normal affect, patient feels poorly ? ? ?Labs  ?  ?Chemistry ?Recent Labs  ?Lab 11/15/21 ?1733 11/16/21 ?0354 11/17/21 ?1771 11/18/21 ?0411 11/20/21 ?0411  ?NA 143 140 143 143 144  ?K 4.0 3.7 3.8 3.6 4.0  ?CL 112* 110 111 112* 114*  ?CO2 '26 26 28 29 27  '$ ?GLUCOSE 99 120* 117* 101* 109*  ?BUN '23 18 14 11 15  '$ ?CREATININE 0.75 0.69 0.79 0.75 0.73  ?CALCIUM 9.8 9.4 9.1 9.4 9.6  ?PROT 6.0* 5.8*  --   --   --   ?ALBUMIN 3.7 3.3* 3.0* 3.2*  --   ?AST 13* 13*  --   --   --   ?ALT 12 13  --   --   --   ?ALKPHOS 99 91  --   --   --   ?BILITOT 0.5 0.6  --   --   --   ?GFRNONAA >60 >60 >60 >60 >60  ?ANIONGAP 5 4* 4* 2* 3*  ?  ? ?Hematology ?Recent Labs  ?Lab 11/17/21 ?1657 11/18/21 ?0411 11/20/21 ?0411  ?WBC 15.0* 16.8* 18.7*  ?RBC 3.80* 3.98 3.92  ?HGB 10.8* 11.6* 11.4*  ?HCT 36.8 38.8 37.3  ?MCV 96.8 97.5 95.2  ?MCH 28.4 29.1 29.1  ?MCHC 29.3* 29.9* 30.6  ?RDW 15.0 15.2  15.1  ?PLT 119* 132* 131*  ? ? ?Cardiac EnzymesNo results for input(s): TROPONINI in the last 168 hours. No results for input(s): TROPIPOC in the last 168 hours.  ? ?BNPNo results for input(s): BNP, PROBNP in the last 168 hours.  ? ?DDimer No results for input(s): DDIMER in the last 168 hours.  ? ?Radiology  ?  ?No results found. ? ?Patient Profile  ?   ?71 y.o. female PMH of paroxysmal atrial fibrillation (on Flecainide), HLD, GERD and CLL who is currently admitted for diverticulitis. Cardiology consulted due to atrial fibrillation with RVR.  ? ?Assessment & Plan  ?  ?Persistent Atrial Fibrillation ?Hypotension  ?CLL and Chronic Thrombocytopenia  ?Acute diverticulitis with no plans for procedures/surgery ?- Risk factors include Age, ?- CHADSVASC=3. ?- on flecainide 75 and lopressor 25 mg PO TID, unable to further tirtrate due to hypotension ?- on eliquis 5 mg PO BID with no missed doses ?- discussed with TRH, Patient and husband; consented for DCCV ? ?Will attempt to get on schedule today ? ?Risks and benefits of  cardioversion have been discussed with the patient.  These include arrhythmia, stroke, & death.  The patient understands these risks and is willing to proceed. ? ? ?For questions or updates, please contact [facility] ?Please consult [email or web address] for contact info under Cardiology/STEMI. ?  ?   ?Signed, ?Werner Lean, MD  ?11/20/2021, 9:05 AM   ? ?

## 2021-11-20 NOTE — Interval H&P Note (Signed)
History and Physical Interval Note: ? ?11/20/2021 ?10:51 AM ? ?[name]  has presented today for surgery, with the diagnosis of a-fib.  The various methods of treatment have been discussed with the patient and family. After consideration of risks, benefits and other options for treatment, the patient has consented to  Procedure(s): ?CARDIOVERSION (N/A) as a surgical intervention.  The patient's history has been reviewed, patient examined, no change in status, stable for surgery.  I have reviewed the patient's chart and labs.  Questions were answered to the patient's satisfaction.   ? ? ?Daisy Black ? ? ?

## 2021-11-20 NOTE — CV Procedure (Signed)
? ?  Electrical Cardioversion Procedure Note ?Daisy Black ?[id number] ?May 04, 1951 ? ?Procedure: Electrical Cardioversion ?Indications:  Atrial Fibrillation ? ?Time Out: Verified patient identification, verified procedure,medications/allergies/relevent history reviewed, required imaging and test results available.  Performed ? ?Procedure Details ? ?The patient was NPO after midnight. Anesthesia was administered at the beside  by [facility].  Cardioversion was done with synchronized biphasic defibrillation with AP pads with 200 Joules despite two attempts, and manual pressure.  The patient did not convert to sinus rhythm. The patient tolerated the procedure well  ? ?IMPRESSION: ? ?Unsuccessful cardioversion of atrial fibrillation.  Will stop flecainide and will try amiodarone load ? ? ? ?Daisy Black Daisy Black ?11/20/2021, 10:57 AM ? ? ?

## 2021-11-21 ENCOUNTER — Encounter (HOSPITAL_COMMUNITY): Payer: Self-pay | Admitting: Internal Medicine

## 2021-11-21 DIAGNOSIS — I480 Paroxysmal atrial fibrillation: Secondary | ICD-10-CM | POA: Diagnosis not present

## 2021-11-21 DIAGNOSIS — I5031 Acute diastolic (congestive) heart failure: Secondary | ICD-10-CM

## 2021-11-21 NOTE — Progress Notes (Signed)
Pharmacy Antibiotic Note ? ?Daisy Black is a 71 y.o. female admitted on 11/15/2021 with Diverticulitis/leukocytosis/CLL. Pharmacy assisting with Zosyn management. Renal function remains stable. ? ?Patient remains afebrile with blood cultures no growth to date.  ? ?Plan: ?No dosage adjustments needed. ?F/u length of therapy ? ?Temp (24hrs), Avg:97.6 ?F (36.4 ?C), Min:97.4 ?F (36.3 ?C), Max:97.8 ?F (36.6 ?C) ? ?Recent Labs  ?Lab 11/15/21 ?1733 11/16/21 ?0354 11/17/21 ?4696 11/18/21 ?0411 11/20/21 ?0411  ?WBC 24.3* 17.6* 15.0* 16.8* 18.7*  ?CREATININE 0.75 0.69 0.79 0.75 0.73  ?  ?Estimated Creatinine Clearance: 85.9 mL/min (by C-G formula based on SCr of 0.73 mg/dL).   ? ?Allergies  ?Allergen Reactions  ? Flagyl [Metronidazole]   ?  Classic side effect of severe nausea, unable to tolerate.   ? ? ?Microbiology results: ?4/20 Blood cx >> ngtd ? ? ?Lorenso Courier, PharmD ?Clinical Pharmacist ?11/21/2021 11:45 AM ? ? ? ? ? ? ?

## 2021-11-21 NOTE — Progress Notes (Signed)
?PROGRESS NOTE ? ? ? ? ?Daisy Black, is a 71 y.o. female, DOB - 07-09-1951, PXT:[id number] ? ?Admit date - 11/15/2021   Admitting Physician Daisy Hoit, DO ? ?Outpatient Primary MD for the patient is Daisy Clarks, FNP ? ?LOS - 6 ? ?Chief Complaint  ?Patient presents with  ? Atrial Fibrillation  ? Hip Pain  ?    ?Brief Narrative:  ? 71 y.o. female with medical history significant of CLL (Follows with Daisy Black, Daisy Black), A-Fib on Eliquis, GERD, Hypothyroidism admitted with acute diverticulitis and also found to be in A-fib with RVR ?Failed DC cardioversion on 11/20/2021, plans for amiodarone load followed by attempt at DC cardioversion probably on Friday, 11/24/2021 ? ?  ?-Assessment and Plan: ?1)PAF with RVR--- PTA patient was on flecanide '50mg'$  bid, lopressor '50mg'$  bid, eliquis '5mg'$  bid, ?---Rate control remains very challenging despite IV Cardizem and p.o. metoprolol and attempts at cardioversion on 11/20/2021 ?-Unable to titrate metoprolol further due to soft BP ?--11/20/2021 unsuccessful cardioversion despite multiple shocks remains in atrial fibrillation ?--Cardiologist recommends stopping flecainide and loading with amiodarone instead ?- plans for amiodarone load followed by attempt at DC cardioversion probably on Friday, 11/24/2021 ? ?2)Acute Diverticulitis--CT shows diverticulitis type findings in the descending and sigmoid colon ?-Persistent leukocytosis in the setting of CLL ?-Continue IV fentanyl as needed for pain control ?-IV Zosyn as ordered ? ?3) CLL--- patient with persistent leukocytosis ? ?4)GERD--continue Protonix ? ?5)Hypothyroidism--- continue levothyroxine 125 mcg daily,   ?-TSH is 1.9 ? ?6)Chronic Thrombocytopenia--- no obvious bleeding at this time,  ?-Stable, ?-monitor closely especially while on Eliquis ? ?7) mild acute anemia--suspect hemodilutional component ?-Hgb stable ?-No acute bleeding concerns ?-Monitor H&H ? ?Disposition/Need for in-Hospital Stay- patient unable to be discharged  at this time due to acute diverticulitis requiring IV antibiotics, afebrile with RVR initially required IV Cardizem for rate control ?--Rate control remains very challenging ?-Failed electrical cardioversion, needs IV amiodarone load prior to attempting cardioversion probably on 11/24/2021 ? ?Status is: Inpatient  ? ?Disposition: The patient is from: Home ?             Anticipated d/c is to: Home ?             Anticipated d/c date is: 3 days ?             Patient currently is not medically stable to d/c. ?Barriers: Not Clinically Stable-  ? ?Procedures:- ?-11/20/2021 unsuccessful cardioversion despite multiple shocks remains in atrial fibrillation ? ?Code Status :  -  Code Status: Full Code  ? ?Family Communication:    (patient is alert, awake and coherent)  ?-Discussed with husband at bedside ? ?DVT Prophylaxis  :   - SCDs   SCDs Start: 11/15/21 2357 ?apixaban (ELIQUIS) tablet 5 mg  ? ?Lab Results  ?Component Value Date  ? PLT 131 (L) 11/20/2021  ? ?Inpatient Medications ? ?Scheduled Meds: ? apixaban  5 mg Oral BID  ? Chlorhexidine Gluconate Cloth  6 each Topical Daily  ? levothyroxine  125 mcg Oral Q0600  ? metoprolol tartrate  25 mg Oral TID  ? pantoprazole  40 mg Oral Daily  ? polyethylene glycol  17 g Oral BID  ? traZODone  50 mg Oral QHS  ? ?Continuous Infusions: ? sodium chloride Stopped (11/19/21 2207)  ? piperacillin-tazobactam (ZOSYN)  IV 12.5 mL/hr at 11/21/21 1450  ? ?PRN Meds:.acetaminophen **OR** acetaminophen, ALPRAZolam, fentaNYL (SUBLIMAZE) injection, oxyCODONE ? ? ?Anti-infectives (From admission, onward)  ? ? Start  Dose/Rate Route Frequency Ordered Stop  ? 11/16/21 0330  piperacillin-tazobactam (ZOSYN) IVPB 3.375 g       ? 3.375 g ?12.5 mL/hr over 240 Minutes Intravenous Every 8 hours 11/16/21 0242    ? 11/15/21 2000  levofloxacin (LEVAQUIN) IVPB 750 mg       ? 750 mg ?100 mL/hr over 90 Minutes Intravenous  Once 11/15/21 1954 11/15/21 2330  ? ?  ?  ?Subjective: ?Daisy Black today has no  fevers, no emesis,  No chest pain,  - ? ?Husband at bedside, dizziness and palpitations with attempts to be active ?-Heart rate in the 110s,  even at rest ? ?Objective: ?Vitals:  ? 11/21/21 1601 11/21/21 1605 11/21/21 1700 11/21/21 1800  ?BP:  124/81 (!) 124/110 (!) 96/57  ?Pulse:  79 (!) 126 (!) 122  ?Resp:  '19 16 17  '$ ?Temp: 99 ?F (37.2 ?C)     ?TempSrc: Oral     ?SpO2:  96% 98% 100%  ?Weight:      ?Height:      ? ? ?Intake/Output Summary (Last 24 hours) at 11/21/2021 1846 ?Last data filed at 11/21/2021 1450 ?Gross per 24 hour  ?Intake 269.04 ml  ?Output --  ?Net 269.04 ml  ? ?Filed Weights  ? 11/20/21 0500 11/20/21 0955 11/21/21 0400  ?Weight: 119.3 kg 119.3 kg 118.6 kg  ? ? ?Physical Exam ?Gen:- Awake Alert, in no acute distress, dizziness palpitations and dyspnea on exertion with attempts at activity ?HEENT:- [place].AT, No sclera icterus ?Neck-Supple Neck,No JVD,.  ?Lungs-  CTAB , fair symmetrical air movement ?CV- S1, S2 normal, irregularly irregular and tachycardic ? abd-  +ve B.Sounds, Abd Soft, improving left-sided abdominal tenderness without rebound or guarding ?Extremity/Skin:- No  edema, pedal pulses present  ?Psych-affect is appropriate, oriented x3 ?Neuro-generalized weakness, no new focal deficits, no tremors ? ?Data Reviewed: I have personally reviewed following labs and imaging studies ? ?CBC: ?Recent Labs  ?Lab 11/15/21 ?1733 11/16/21 ?0354 11/17/21 ?3009 11/18/21 ?0411 11/20/21 ?0411  ?WBC 24.3* 17.6* 15.0* 16.8* 18.7*  ?NEUTROABS 7.8*  --   --   --   --   ?HGB 12.5 11.8* 10.8* 11.6* 11.4*  ?HCT 40.7 40.2 36.8 38.8 37.3  ?MCV 95.8 95.9 96.8 97.5 95.2  ?PLT 131* 119* 119* 132* 131*  ? ?Basic Metabolic Panel: ?Recent Labs  ?Lab 11/15/21 ?1733 11/16/21 ?0354 11/17/21 ?2330 11/18/21 ?0411 11/20/21 ?0411  ?NA 143 140 143 143 144  ?K 4.0 3.7 3.8 3.6 4.0  ?CL 112* 110 111 112* 114*  ?CO2 '26 26 28 29 27  '$ ?GLUCOSE 99 120* 117* 101* 109*  ?BUN '23 18 14 11 15  '$ ?CREATININE 0.75 0.69 0.79 0.75 0.73  ?CALCIUM 9.8 9.4  9.1 9.4 9.6  ?MG  --  2.2  --  2.2  --   ?PHOS  --  3.1 3.0 3.3  --   ? ?GFR: ?Estimated Creatinine Clearance: 85.9 mL/min (by C-G formula based on SCr of 0.73 mg/dL). ?Liver Function Tests: ?Recent Labs  ?Lab 11/15/21 ?1733 11/16/21 ?0354 11/17/21 ?0762 11/18/21 ?0411  ?AST 13* 13*  --   --   ?ALT 12 13  --   --   ?ALKPHOS 99 91  --   --   ?BILITOT 0.5 0.6  --   --   ?PROT 6.0* 5.8*  --   --   ?ALBUMIN 3.7 3.3* 3.0* 3.2*  ? ?Cardiac Enzymes: ?No results for input(s): CKTOTAL, CKMB, CKMBINDEX, TROPONINI in the last 168 hours. ?BNP (last 3  results) ?No results for input(s): PROBNP in the last 8760 hours. ?HbA1C: ?No results for input(s): HGBA1C in the last 72 hours. ?Sepsis Labs: ?'@LABRCNTIP'$ (procalcitonin:4,lacticidven:4) ?) ?Recent Results (from the past 240 hour(s))  ?MRSA Next Gen by PCR, Nasal     Status: None  ? Collection Time: 11/16/21 12:15 AM  ? Specimen: Nasal Mucosa; Nasal Swab  ?Result Value Ref Range Status  ? MRSA by PCR Next Gen NOT DETECTED NOT DETECTED Final  ?  Comment: (NOTE) ?The GeneXpert MRSA Assay (FDA approved for NASAL specimens only), ?is one component of a comprehensive MRSA colonization surveillance ?program. It is not intended to diagnose MRSA infection nor to guide ?or monitor treatment for MRSA infections. ?Test performance is not FDA approved in patients less than 2 years ?old. ?Performed at [facility], [address], [place], [place] [address] ?  ?Culture, blood (Routine X 2) w Reflex to ID Panel     Status: None (Preliminary result)  ? Collection Time: 11/16/21  3:54 AM  ? Specimen: BLOOD  ?Result Value Ref Range Status  ? Specimen Description BLOOD BLOOD RIGHT HAND  Final  ? Special Requests   Final  ?  BOTTLES DRAWN AEROBIC AND ANAEROBIC Blood Culture adequate volume  ? Culture   Final  ?  NO GROWTH 3 DAYS ?Performed at [facility], [address], [place], [place] [address] ?  ? Report Status PENDING  Incomplete  ?Culture, blood (Routine X 2) w Reflex to ID Panel     Status:  None (Preliminary result)  ? Collection Time: 11/16/21  3:54 AM  ? Specimen: BLOOD  ?Result Value Ref Range Status  ? Specimen Description BLOOD  Final  ? Special Requests NONE  Final  ? Culture   Final  ?  N

## 2021-11-21 NOTE — Progress Notes (Signed)
? ?Progress Note ? ?Patient Name: Daisy Black ?Date of Encounter: 11/21/2021 ? ?Primary Cardiologist: Rozann Lesches, MD  ? ?Subjective  ? ?S/P failed DCCV yesterday.  Remains in atrial fibrillation with HR currently 90bpm but at times up in the 110's.  ? ?Inpatient Medications  ?  ?Scheduled Meds: ? amiodarone  400 mg Oral BID  ? apixaban  5 mg Oral BID  ? Chlorhexidine Gluconate Cloth  6 each Topical Daily  ? levothyroxine  125 mcg Oral Q0600  ? metoprolol tartrate  25 mg Oral TID  ? pantoprazole  40 mg Oral Daily  ? polyethylene glycol  17 g Oral BID  ? traZODone  50 mg Oral QHS  ? ?Continuous Infusions: ? sodium chloride Stopped (11/19/21 2207)  ? piperacillin-tazobactam (ZOSYN)  IV 3.375 g (11/21/21 0523)  ? ?PRN Meds: ?acetaminophen **OR** acetaminophen, ALPRAZolam, fentaNYL (SUBLIMAZE) injection, oxyCODONE  ? ?Vital Signs  ?  ?Vitals:  ? 11/21/21 0700 11/21/21 0800 11/21/21 0900 11/21/21 1027  ?BP: 112/84 106/71 124/85 (!) 104/53  ?Pulse: 98 (!) 117 (!) 117 60  ?Resp: '10 13 19 15  '$ ?Temp:      ?TempSrc:      ?SpO2: 100% 100% 98% 96%  ?Weight:      ?Height:      ? ? ?Intake/Output Summary (Last 24 hours) at 11/21/2021 1028 ?Last data filed at 11/21/2021 2620 ?Gross per 24 hour  ?Intake 450 ml  ?Output --  ?Net 450 ml  ? ? ?Filed Weights  ? 11/20/21 0500 11/20/21 0955 11/21/21 0400  ?Weight: 119.3 kg 119.3 kg 118.6 kg  ? ? ?Telemetry  ?  ?Atrial fibrillation with heart rate in the 90s but goes up in the 110s at time's- personally Reviewed ? ?Physical Exam  ? ?GEN: Well nourished, well developed in no acute distress, morbidly obese ?HEENT: Normal ?NECK: No JVD; No carotid bruits ?LYMPHATICS: No lymphadenopathy ?CARDIAC: Irregularly irregular, no murmurs, rubs, gallops ?RESPIRATORY:  Clear to auscultation without rales, wheezing or rhonchi  ?ABDOMEN: Soft, non-tender, non-distended ?MUSCULOSKELETAL:  trace LLE edema; No deformity  ?SKIN: Warm and dry ?NEUROLOGIC:  Alert and oriented x 3 ?PSYCHIATRIC:  Normal  affect   ? ?Labs  ?  ?Chemistry ?Recent Labs  ?Lab 11/15/21 ?1733 11/16/21 ?0354 11/17/21 ?3559 11/18/21 ?0411 11/20/21 ?0411  ?NA 143 140 143 143 144  ?K 4.0 3.7 3.8 3.6 4.0  ?CL 112* 110 111 112* 114*  ?CO2 '26 26 28 29 27  '$ ?GLUCOSE 99 120* 117* 101* 109*  ?BUN '23 18 14 11 15  '$ ?CREATININE 0.75 0.69 0.79 0.75 0.73  ?CALCIUM 9.8 9.4 9.1 9.4 9.6  ?PROT 6.0* 5.8*  --   --   --   ?ALBUMIN 3.7 3.3* 3.0* 3.2*  --   ?AST 13* 13*  --   --   --   ?ALT 12 13  --   --   --   ?ALKPHOS 99 91  --   --   --   ?BILITOT 0.5 0.6  --   --   --   ?GFRNONAA >60 >60 >60 >60 >60  ?ANIONGAP 5 4* 4* 2* 3*  ? ?  ? ?Hematology ?Recent Labs  ?Lab 11/17/21 ?7416 11/18/21 ?0411 11/20/21 ?0411  ?WBC 15.0* 16.8* 18.7*  ?RBC 3.80* 3.98 3.92  ?HGB 10.8* 11.6* 11.4*  ?HCT 36.8 38.8 37.3  ?MCV 96.8 97.5 95.2  ?MCH 28.4 29.1 29.1  ?MCHC 29.3* 29.9* 30.6  ?RDW 15.0 15.2 15.1  ?PLT 119* 132* 131*  ? ? ? ?  Cardiac EnzymesNo results for input(s): TROPONINI in the last 168 hours. No results for input(s): TROPIPOC in the last 168 hours.  ? ?BNPNo results for input(s): BNP, PROBNP in the last 168 hours.  ? ?DDimer No results for input(s): DDIMER in the last 168 hours.  ? ?Radiology  ?  ?No results found. ? ?Patient Profile  ?   ?71 y.o. female PMH of paroxysmal atrial fibrillation (on Flecainide), HLD, GERD and CLL who is currently admitted for diverticulitis. Cardiology consulted due to atrial fibrillation with RVR.  ? ?Assessment & Plan  ?  ?Persistent Atrial Fibrillation ?- Occurred in the setting of acute diverticulitis  ?- risk factors include Age, ?- CHADSVASC=3. ?- on flecainide 75 and lopressor 25 mg PO TID, unable to further tirtrate due to hypotension ?- on eliquis 5 mg PO BID with no missed doses ?- s/p failed DCCV yesterday ?- Flecainide was stopped yesterday and started on p.o. Amio 400 mg twice daily load with plans for cardioversion after several days of loading ?- Discussed with EP my concerns of starting amnio without holding flecainide for 48  hours and risk of increased flecainide levels. ?-There was some wide-complex tachycardia noted on telemetry although I suspect that this is mainly artifact and does not represent flecainide toxicity as it appears that there are QRSs that march through ?- Cannot add calcium channel blocker or uptitrate beta-blocker due to soft blood pressures. ?- Recommend that we hold amiodarone today to avoid increased levels of flecainide while it is working out and then tomorrow start IV amio afternoon once she is 48 hours without a dose of flecainide ?-Recommend loading with IV amnio for a few days prior to repeat cardioversion ? ?Hypotension  ?-This has improved some but still intermittently with soft blood pressures ? ?CLL and Chronic Thrombocytopenia  ?-Per [facility] ? ?Acute diverticulitis  ?-Per [facility] and surgery with no plans for procedures/surgery at this time ? ?For questions or updates, please contact [place] ?Please consult [email or web address] for contact info under Cardiology/STEMI. ?  ?   ?Signed, ?Fransico Him, MD  ?11/21/2021, 10:28 AM   ? ?

## 2021-11-22 DIAGNOSIS — I480 Paroxysmal atrial fibrillation: Secondary | ICD-10-CM | POA: Diagnosis not present

## 2021-11-22 DIAGNOSIS — I4891 Unspecified atrial fibrillation: Secondary | ICD-10-CM

## 2021-11-22 LAB — CULTURE, BLOOD (ROUTINE X 2)
Culture: NO GROWTH
Culture: NO GROWTH
Special Requests: ADEQUATE

## 2021-11-22 LAB — CBC
HCT: 36.7 % (ref 36.0–46.0)
Hemoglobin: 11.2 g/dL — ABNORMAL LOW (ref 12.0–15.0)
MCH: 29.3 pg (ref 26.0–34.0)
MCHC: 30.5 g/dL (ref 30.0–36.0)
MCV: 96.1 fL (ref 80.0–100.0)
Platelets: 126 10*3/uL — ABNORMAL LOW (ref 150–400)
RBC: 3.82 MIL/uL — ABNORMAL LOW (ref 3.87–5.11)
RDW: 15.3 % (ref 11.5–15.5)
WBC: 18.8 10*3/uL — ABNORMAL HIGH (ref 4.0–10.5)
nRBC: 0 % (ref 0.0–0.2)

## 2021-11-22 LAB — RENAL FUNCTION PANEL
Albumin: 3.2 g/dL — ABNORMAL LOW (ref 3.5–5.0)
Anion gap: 4 — ABNORMAL LOW (ref 5–15)
BUN: 14 mg/dL (ref 8–23)
CO2: 25 mmol/L (ref 22–32)
Calcium: 9.4 mg/dL (ref 8.9–10.3)
Chloride: 112 mmol/L — ABNORMAL HIGH (ref 98–111)
Creatinine, Ser: 0.93 mg/dL (ref 0.44–1.00)
GFR, Estimated: >60 mL/min (ref >60)
Glucose, Bld: 106 mg/dL — ABNORMAL HIGH (ref 70–99)
Phosphorus: 3.3 mg/dL (ref 2.5–4.6)
Potassium: 4 mmol/L (ref 3.5–5.1)
Sodium: 141 mmol/L (ref 135–145)

## 2021-11-22 MED ORDER — ONDANSETRON HCL 4 MG/2ML IJ SOLN
4.0000 mg | Freq: Four times a day (QID) | INTRAMUSCULAR | Status: DC | PRN
  Administered 2021-11-22 – 2021-11-23 (×3): 4 mg via INTRAVENOUS
  Filled 2021-11-22 (×3): qty 2

## 2021-11-22 MED ORDER — AMIODARONE HCL IN DEXTROSE 360-4.14 MG/200ML-% IV SOLN
60.0000 mg/h | INTRAVENOUS | Status: AC
  Administered 2021-11-22 (×2): 60 mg/h via INTRAVENOUS
  Filled 2021-11-22: qty 200

## 2021-11-22 MED ORDER — AMIODARONE LOAD VIA INFUSION
150.0000 mg | Freq: Once | INTRAVENOUS | Status: AC
  Administered 2021-11-22: 150 mg via INTRAVENOUS
  Filled 2021-11-22: qty 83.34

## 2021-11-22 MED ORDER — AMIODARONE HCL 200 MG PO TABS: 200.0000 mg | ORAL_TABLET | Freq: Every day | ORAL | Status: DC

## 2021-11-22 MED ORDER — AMIODARONE HCL IN DEXTROSE 360-4.14 MG/200ML-% IV SOLN
30.0000 mg/h | INTRAVENOUS | Status: DC
  Administered 2021-11-23 (×2): 30 mg/h via INTRAVENOUS
  Filled 2021-11-22 (×4): qty 200

## 2021-11-22 NOTE — Progress Notes (Signed)
MEDICATION RELATED CONSULT NOTE - INITIAL  ? ?Pharmacy Consult for Drug-Drug interaction with Amiodarone ? ? ?Allergies  ?Allergen Reactions  ? Flagyl [Metronidazole]   ?  Classic side effect of severe nausea, unable to tolerate.   ? ? ?Medications:  ?Amiodarone gtt ?Apixaban ?PRN meds: fenanyl, alprazolam, oxycodone ? ?Assessment: ?71 year old female hx Afib with persistent Afib w/ RVR, failed initial cardioversion. IV amio initiated with plans to repeat cardioversion. Amio is a  CYP 3A4 inhibitor - may increase apixaban levels however not considered significant enough to adjust dosing. Would continue to monitor for x/sx bleeding. ?May also increase levels of alprazolam, fentanyl, oxycodone. These are prn and can be monitored while inpatient. ? ?Plan:  ?No significant interactions identified. No changes needed. ? ?Lorenso Courier, PharmD ?Clinical Pharmacist ?11/22/2021 12:00 PM ? ? ? ? ? ? ? ? ?

## 2021-11-22 NOTE — Progress Notes (Signed)
Patient requested IV Zofran d/t c/o nausea d/t cardiac medications she takes, MD notified  ?

## 2021-11-22 NOTE — Progress Notes (Signed)
? ?Progress Note ? ?Patient Name: Daisy Black ?Date of Encounter: 11/22/2021 ? ?Primary Cardiologist: Rozann Lesches, MD ? ?Subjective  ? ?Eating breakfast this morning.  No abdominal pain.  No chest pain or palpitations. ? ?Inpatient Medications  ?  ?Scheduled Meds: ? apixaban  5 mg Oral BID  ? Chlorhexidine Gluconate Cloth  6 each Topical Daily  ? levothyroxine  125 mcg Oral Q0600  ? metoprolol tartrate  25 mg Oral TID  ? pantoprazole  40 mg Oral Daily  ? polyethylene glycol  17 g Oral BID  ? traZODone  50 mg Oral QHS  ? ?Continuous Infusions: ? sodium chloride Stopped (11/19/21 2207)  ? piperacillin-tazobactam (ZOSYN)  IV 3.375 g (11/22/21 0540)  ? ?PRN Meds: ?acetaminophen **OR** acetaminophen, ALPRAZolam, fentaNYL (SUBLIMAZE) injection, oxyCODONE  ? ?Vital Signs  ?  ?Vitals:  ? 11/22/21 0400 11/22/21 0500 11/22/21 0542 11/22/21 0600  ?BP: (!) 90/54 115/69  (!) 92/57  ?Pulse: 97 (!) 120    ?Resp: 19 12    ?Temp:      ?TempSrc:      ?SpO2: 96% 99%    ?Weight:   118.3 kg   ?Height:      ? ? ?Intake/Output Summary (Last 24 hours) at 11/22/2021 0818 ?Last data filed at 11/21/2021 1450 ?Gross per 24 hour  ?Intake 69.04 ml  ?Output --  ?Net 69.04 ml  ? ?Filed Weights  ? 11/20/21 0955 11/21/21 0400 11/22/21 0542  ?Weight: 119.3 kg 118.6 kg 118.3 kg  ? ? ?Telemetry  ?  ?Atrial fibrillation with lead artifact (not VT).  Personally reviewed. ? ?ECG  ?  ?An ECG dated 11/15/2021 was personally reviewed today and demonstrated:  Atrial fibrillation with RVR, Q-wave in lead III. ? ?Physical Exam  ? ?GEN: No acute distress.   ?Neck: No JVD. ?Cardiac: Irregularly irregular without gallop.  ?Respiratory: Nonlabored. Clear to auscultation bilaterally. ?GI: Soft, nontender, bowel sounds present. ?MS: No edema; No deformity. ?Neuro:  Nonfocal. ?Psych: Alert and oriented x 3. Normal affect. ? ?Labs  ?  ?Chemistry ?Recent Labs  ?Lab 11/15/21 ?1733 11/16/21 ?0354 11/17/21 ?5726 11/18/21 ?0411 11/20/21 ?0411 11/22/21 ?2035  ?NA 143  140 143 143 144 141  ?K 4.0 3.7 3.8 3.6 4.0 4.0  ?CL 112* 110 111 112* 114* 112*  ?CO2 '26 26 28 29 27 25  '$ ?GLUCOSE 99 120* 117* 101* 109* 106*  ?BUN '23 18 14 11 15 14  '$ ?CREATININE 0.75 0.69 0.79 0.75 0.73 0.93  ?CALCIUM 9.8 9.4 9.1 9.4 9.6 9.4  ?PROT 6.0* 5.8*  --   --   --   --   ?ALBUMIN 3.7 3.3* 3.0* 3.2*  --  3.2*  ?AST 13* 13*  --   --   --   --   ?ALT 12 13  --   --   --   --   ?ALKPHOS 99 91  --   --   --   --   ?BILITOT 0.5 0.6  --   --   --   --   ?GFRNONAA >60 >60 >60 >60 >60 >60  ?ANIONGAP 5 4* 4* 2* 3* 4*  ?  ? ?Hematology ?Recent Labs  ?Lab 11/18/21 ?0411 11/20/21 ?0411 11/22/21 ?5974  ?WBC 16.8* 18.7* 18.8*  ?RBC 3.98 3.92 3.82*  ?HGB 11.6* 11.4* 11.2*  ?HCT 38.8 37.3 36.7  ?MCV 97.5 95.2 96.1  ?MCH 29.1 29.1 29.3  ?MCHC 29.9* 30.6 30.5  ?RDW 15.2 15.1 15.3  ?PLT 132* 131* 126*  ? ? ?  Radiology  ?  ?No results found. ? ?Cardiac Studies  ? ?Echocardiogram 06/29/2020: ? 1. Left ventricular ejection fraction, by estimation, is 60 to 65%. The  ?left ventricle has normal function. The left ventricle has no regional  ?wall motion abnormalities. There is mild left ventricular hypertrophy.  ?Left ventricular diastolic parameters  ?are consistent with Grade I diastolic dysfunction (impaired relaxation).  ? 2. Right ventricular systolic function is normal. The right ventricular  ?size is normal.  ? 3. The mitral valve is normal in structure. No evidence of mitral valve  ?regurgitation. No evidence of mitral stenosis.  ? 4. The aortic valve is tricuspid. Aortic valve regurgitation is not  ?visualized. No aortic stenosis is present.  ? 5. The inferior vena cava is normal in size with greater than 50%  ?respiratory variability, suggesting right atrial pressure of 3 mmHg.  ? ?Assessment & Plan  ?  ?1.  Persistent atrial fibrillation with RVR in the setting of acute diverticulitis.  She has a history of paroxysmal atrial fibrillation and has generally done well on flecainide since 2017.  Underwent attempted  cardioversion Monday which failed and plan is now to convert to IV amiodarone with repeat cardioversion attempt eventually.  She remains on Eliquis and Lopressor.  CHA2DS2-VASc score is 3.  Flecainide was discontinued on Monday. ? ?2.  Acute diverticulitis, no surgical/invasive procedures planned.  She has clinically improved.  On IV Zosyn.  Afebrile. ? ?3.  CLL with chronic thrombocytopenia. ? ?I reviewed the chart and discussed the situation with the patient this morning.  Plan to initiate IV amiodarone this afternoon (48 hours after flecainide), otherwise continue Eliquis and Lopressor.  If she does not convert in the interim, most likely plan on repeat cardioversion attempt for Friday.  Check ECG tomorrow. ? ?Signed, ?Rozann Lesches, MD  ?11/22/2021, 8:18 AM    ?

## 2021-11-22 NOTE — Progress Notes (Signed)
?PROGRESS NOTE ? ? ? ? ?Daisy Black, is a 71 y.o. female, DOB - [birth year], KDX:[id number] ? ?Admit date - 11/15/2021   Admitting Physician Bernadette Hoit, DO ? ?Outpatient Primary MD for the patient is Ludwig Clarks, FNP ? ?LOS - 7 ? ?Chief Complaint  ?Patient presents with  ? Atrial Fibrillation  ? Hip Pain  ?    ? ?Subjective: ?Toby Ayad was seen and examined this morning, ?Was complaining of some dizziness, hypotensive overnight BP as low as 90/54, currently 92/57, heart rate 106, RR 22 ? ?Denies any chest pain, only palpitation ? ? ?Brief Narrative:  ? 71 y.o. female with medical history significant of CLL (Follows with Dr Grayland Ormond, [facility]), A-Fib on Eliquis, GERD, Hypothyroidism admitted with acute diverticulitis and also found to be in A-fib with RVR ?Failed DC cardioversion on 11/20/2021, plans for amiodarone load followed by attempt at DC cardioversion probably on Friday, 11/24/2021 ? ?  ?-Assessment and Plan: ?1)PAF with RVR--- PTA patient was on flecanide '50mg'$  bid, lopressor '50mg'$  bid, eliquis '5mg'$  bid, ?---Rate control remains very challenging despite IV Cardizem and p.o. metoprolol and attempts at cardioversion on 11/20/2021 ?-Unable to titrate metoprolol further due to soft BP ?--11/20/2021 unsuccessful cardioversion despite multiple shocks remains in atrial fibrillation ?--Cardiologist recommends stopping flecainide and loading with amiodarone instead ?- plans for amiodarone load followed by attempt at DC cardioversion probably on Friday, 11/24/2021 ? ?-IV amiodarone was reinitiated today 11/22/2021 ? ?2)Acute Diverticulitis--CT shows diverticulitis type findings in the descending and sigmoid colon ?-Persistent leukocytosis in the setting of CLL ?-Continue IV fentanyl as needed for pain control ?-IV Zosyn will complete a 7-day course today 11/22/2021 ?(Still having some leukocytosis, but afebrile..  With a history of CLL) ? ?3) CLL--- patient with persistent leukocytosis ?We will discontinue IV Zosyn  completed 7-day course ? ?4)GERD--continue Protonix ? ?5)Hypothyroidism--- continue levothyroxine 125 mcg daily,   ?-TSH is 1.9 ? ?6)Chronic Thrombocytopenia--- no obvious bleeding at this time,  ?-Remained stable ?-monitor closely especially while on Eliquis ? ?7) mild acute anemia--suspect hemodilutional component ?-Hgb stable ?-No acute bleeding concerns ?-Monitor H&H ? ? ?-------------------------------------------------------------------------------------------------------------- ?Disposition/Need for in-Hospital Stay- patient unable to be discharged at this time due to acute diverticulitis requiring IV antibiotics, afebrile with RVR initially required IV amiodarone, planning for cardioversion again ? ?--Rate control remains very challenging ?-Failed electrical cardioversion, needs IV amiodarone load prior to attempting cardioversion probably on 11/24/2021 ? ?Status is: Inpatient  ? ?Disposition: The patient is from: Home ?             Anticipated d/c is to: Home ?             Anticipated d/c date is: 3 days ?             Patient currently is not medically stable to d/c. ?Barriers: Not Clinically Stable-  ? ?Procedures:- ?-11/20/2021 unsuccessful cardioversion despite multiple shocks remains in atrial fibrillation ? ?Code Status :  -  Code Status: Full Code  ? ?Family Communication:    (patient is alert, awake and coherent)  ?-Discussed with husband at bedside ? ?DVT Prophylaxis  :   - SCDs   SCDs Start: 11/15/21 2357 ?apixaban (ELIQUIS) tablet 5 mg  ? ?Lab Results  ?Component Value Date  ? PLT 126 (L) 11/22/2021  ? ?Inpatient Medications ? ?Scheduled Meds: ? amiodarone  150 mg Intravenous Once  ? apixaban  5 mg Oral BID  ? Chlorhexidine Gluconate Cloth  6 each Topical Daily  ? levothyroxine  125 mcg Oral Q0600  ? metoprolol tartrate  25 mg Oral TID  ? pantoprazole  40 mg Oral Daily  ? polyethylene glycol  17 g Oral BID  ? traZODone  50 mg Oral QHS  ? ?Continuous Infusions: ? sodium chloride Stopped (11/19/21  2207)  ? amiodarone    ? Followed by  ? amiodarone    ? piperacillin-tazobactam (ZOSYN)  IV 3.375 g (11/22/21 0540)  ? ?PRN Meds:.acetaminophen **OR** acetaminophen, ALPRAZolam, fentaNYL (SUBLIMAZE) injection, oxyCODONE ? ? ?Anti-infectives (From admission, onward)  ? ? Start     Dose/Rate Route Frequency Ordered Stop  ? 11/16/21 0330  piperacillin-tazobactam (ZOSYN) IVPB 3.375 g       ? 3.375 g ?12.5 mL/hr over 240 Minutes Intravenous Every 8 hours 11/16/21 0242    ? 11/15/21 2000  levofloxacin (LEVAQUIN) IVPB 750 mg       ? 750 mg ?100 mL/hr over 90 Minutes Intravenous  Once 11/15/21 1954 11/15/21 2330  ? ?  ?  ? ? ?Objective: ?Vitals:  ? 11/22/21 0542 11/22/21 0600 11/22/21 0730 11/22/21 1146  ?BP:  (!) 92/57    ?Pulse:   68 (!) 106  ?Resp:   15 (!) 22  ?Temp:   97.9 ?F (36.6 ?C) 98.1 ?F (36.7 ?C)  ?TempSrc:   Oral Oral  ?SpO2:   98% 97%  ?Weight: 118.3 kg     ?Height:      ? ? ?Intake/Output Summary (Last 24 hours) at 11/22/2021 1317 ?Last data filed at 11/21/2021 1450 ?Gross per 24 hour  ?Intake 69.04 ml  ?Output --  ?Net 69.04 ml  ? ?Filed Weights  ? 11/20/21 0955 11/21/21 0400 11/22/21 0542  ?Weight: 119.3 kg 118.6 kg 118.3 kg  ? ?Physical Exam: ?  ?General:  AAO x 3,  cooperative, no distress; dizzy  ?HEENT:  Normocephalic, PERRL, otherwise with in Normal limits   ?Neuro:  CNII-XII intact. , normal motor and sensation, reflexes intact   ?Lungs:   Clear to auscultation BL, Respirations unlabored,  ?No wheezes / crackles  ?Cardio:    S1/S2, irregularly irregular -  murmure, No Rubs or Gallops   ?Abdomen:  Soft, non-tender, bowel sounds active all four quadrants, ?no guarding or peritoneal signs.  ?Muscular  ?skeletal:  Limited exam -global generalized weaknesses ?- in bed, able to move all 4 extremities,   ?2+ pulses,  symmetric, No pitting edema  ?Skin:  Dry, warm to touch, negative for any Rashes,  ?Wounds: Please see nursing documentation ?   ? ? ?  ? ?Data Reviewed: I have personally reviewed following labs  and imaging studies ? ?CBC: ?Recent Labs  ?Lab 11/15/21 ?1733 11/16/21 ?0354 11/17/21 ?1610 11/18/21 ?0411 11/20/21 ?0411 11/22/21 ?9604  ?WBC 24.3* 17.6* 15.0* 16.8* 18.7* 18.8*  ?NEUTROABS 7.8*  --   --   --   --   --   ?HGB 12.5 11.8* 10.8* 11.6* 11.4* 11.2*  ?HCT 40.7 40.2 36.8 38.8 37.3 36.7  ?MCV 95.8 95.9 96.8 97.5 95.2 96.1  ?PLT 131* 119* 119* 132* 131* 126*  ? ?Basic Metabolic Panel: ?Recent Labs  ?Lab 11/16/21 ?0354 11/17/21 ?5409 11/18/21 ?0411 11/20/21 ?0411 11/22/21 ?8119  ?NA 140 143 143 144 141  ?K 3.7 3.8 3.6 4.0 4.0  ?CL 110 111 112* 114* 112*  ?CO2 '26 28 29 27 25  '$ ?GLUCOSE 120* 117* 101* 109* 106*  ?BUN '18 14 11 15 14  '$ ?CREATININE 0.69 0.79 0.75 0.73 0.93  ?CALCIUM 9.4 9.1 9.4 9.6 9.4  ?  MG 2.2  --  2.2  --   --   ?PHOS 3.1 3.0 3.3  --  3.3  ? ?GFR: ?Estimated Creatinine Clearance: 73.8 mL/min (by C-G formula based on SCr of 0.93 mg/dL). ?Liver Function Tests: ?Recent Labs  ?Lab 11/15/21 ?1733 11/16/21 ?0354 11/17/21 ?1937 11/18/21 ?0411 11/22/21 ?9024  ?AST 13* 13*  --   --   --   ?ALT 12 13  --   --   --   ?ALKPHOS 99 91  --   --   --   ?BILITOT 0.5 0.6  --   --   --   ?PROT 6.0* 5.8*  --   --   --   ?ALBUMIN 3.7 3.3* 3.0* 3.2* 3.2*  ? ? ? ?Recent Results (from the past 240 hour(s))  ?MRSA Next Gen by PCR, Nasal     Status: None  ? Collection Time: 11/16/21 12:15 AM  ? Specimen: Nasal Mucosa; Nasal Swab  ?Result Value Ref Range Status  ? MRSA by PCR Next Gen NOT DETECTED NOT DETECTED Final  ?  Comment: (NOTE) ?The GeneXpert MRSA Assay (FDA approved for NASAL specimens only), ?is one component of a comprehensive MRSA colonization surveillance ?program. It is not intended to diagnose MRSA infection nor to guide ?or monitor treatment for MRSA infections. ?Test performance is not FDA approved in patients less than 2 years ?old. ?Performed at [facility], [address], [place], [place] [address] ?  ?Culture, blood (Routine X 2) w Reflex to ID Panel     Status: None  ? Collection Time: 11/16/21  3:54  AM  ? Specimen: BLOOD  ?Result Value Ref Range Status  ? Specimen Description BLOOD BLOOD RIGHT HAND  Final  ? Special Requests   Final  ?  BOTTLES DRAWN AEROBIC AND ANAEROBIC Blood Culture adequate vol

## 2021-11-22 NOTE — Telephone Encounter (Signed)
Pt currently admitted. Fyi to Vicente Males ?

## 2021-11-23 DIAGNOSIS — I480 Paroxysmal atrial fibrillation: Secondary | ICD-10-CM | POA: Diagnosis not present

## 2021-11-23 DIAGNOSIS — I4819 Other persistent atrial fibrillation: Secondary | ICD-10-CM | POA: Diagnosis not present

## 2021-11-23 NOTE — Progress Notes (Signed)
? ?Progress Note ? ?Patient Name: Daisy Black ?Date of Encounter: 11/23/2021 ? ?Primary Cardiologist: Rozann Lesches, MD ? ?Subjective  ? ?Patient sleeping comfortably this morning.  I spoke with family member in the room. ? ?Inpatient Medications  ?  ?Scheduled Meds: ? apixaban  5 mg Oral BID  ? Chlorhexidine Gluconate Cloth  6 each Topical Daily  ? levothyroxine  125 mcg Oral Q0600  ? metoprolol tartrate  25 mg Oral TID  ? pantoprazole  40 mg Oral Daily  ? polyethylene glycol  17 g Oral BID  ? traZODone  50 mg Oral QHS  ? ?Continuous Infusions: ? sodium chloride Stopped (11/19/21 2207)  ? amiodarone 30 mg/hr (11/23/21 0329)  ? ?PRN Meds: ?acetaminophen **OR** acetaminophen, ALPRAZolam, fentaNYL (SUBLIMAZE) injection, ondansetron (ZOFRAN) IV, oxyCODONE  ? ?Vital Signs  ?  ?Vitals:  ? 11/22/21 1800 11/22/21 2000 11/22/21 2207 11/23/21 0713  ?BP: (!) 93/51  125/77   ?Pulse: 94  86 (!) 36  ?Resp: 20   12  ?Temp:  98 ?F (36.7 ?C)  97.7 ?F (36.5 ?C)  ?TempSrc:  Oral  Oral  ?SpO2: 97%   97%  ?Weight:      ?Height:      ? ? ?Intake/Output Summary (Last 24 hours) at 11/23/2021 0815 ?Last data filed at 11/22/2021 1500 ?Gross per 24 hour  ?Intake 145.84 ml  ?Output --  ?Net 145.84 ml  ? ?Filed Weights  ? 11/20/21 0955 11/21/21 0400 11/22/21 0542  ?Weight: 119.3 kg 118.6 kg 118.3 kg  ? ? ?Telemetry  ?  ?Atrial fibrillation with lead artifact (not VT).  Personally reviewed. ? ?ECG  ?  ?An ECG dated 11/15/2021 was personally reviewed today and demonstrated:  Atrial fibrillation with RVR, Q-wave in lead III. ? ?Physical Exam  ? ?GEN: No acute distress.   ?Neck: No JVD. ?Cardiac: Irregularly irregular without gallop.  ?Respiratory: Nonlabored. Clear to auscultation bilaterally. ?MS: No edema. ? ?Labs  ?  ?Chemistry ?Recent Labs  ?Lab 11/17/21 ?4008 11/18/21 ?0411 11/20/21 ?0411 11/22/21 ?6761  ?NA 143 143 144 141  ?K 3.8 3.6 4.0 4.0  ?CL 111 112* 114* 112*  ?CO2 '28 29 27 25  '$ ?GLUCOSE 117* 101* 109* 106*  ?BUN '14 11 15 14   '$ ?CREATININE 0.79 0.75 0.73 0.93  ?CALCIUM 9.1 9.4 9.6 9.4  ?ALBUMIN 3.0* 3.2*  --  3.2*  ?GFRNONAA >60 >60 >60 >60  ?ANIONGAP 4* 2* 3* 4*  ?  ? ?Hematology ?Recent Labs  ?Lab 11/18/21 ?0411 11/20/21 ?0411 11/22/21 ?9509  ?WBC 16.8* 18.7* 18.8*  ?RBC 3.98 3.92 3.82*  ?HGB 11.6* 11.4* 11.2*  ?HCT 38.8 37.3 36.7  ?MCV 97.5 95.2 96.1  ?MCH 29.1 29.1 29.3  ?MCHC 29.9* 30.6 30.5  ?RDW 15.2 15.1 15.3  ?PLT 132* 131* 126*  ? ? ?Radiology  ?  ?No results found. ? ?Cardiac Studies  ? ?Echocardiogram 06/29/2020: ? 1. Left ventricular ejection fraction, by estimation, is 60 to 65%. The  ?left ventricle has normal function. The left ventricle has no regional  ?wall motion abnormalities. There is mild left ventricular hypertrophy.  ?Left ventricular diastolic parameters  ?are consistent with Grade I diastolic dysfunction (impaired relaxation).  ? 2. Right ventricular systolic function is normal. The right ventricular  ?size is normal.  ? 3. The mitral valve is normal in structure. No evidence of mitral valve  ?regurgitation. No evidence of mitral stenosis.  ? 4. The aortic valve is tricuspid. Aortic valve regurgitation is not  ?visualized. No  aortic stenosis is present.  ? 5. The inferior vena cava is normal in size with greater than 50%  ?respiratory variability, suggesting right atrial pressure of 3 mmHg.  ? ?Assessment & Plan  ?  ?1.  Persistent atrial fibrillation presenting with RVR in the setting of acute diverticulitis.  She has a history of paroxysmal atrial fibrillation and has generally done well on flecainide since 2017.  Underwent attempted cardioversion Monday which failed and plan is now to convert to IV amiodarone with repeat cardioversion attempt.  She remains on Eliquis and Lopressor.  CHA2DS2-VASc score is 3.  Flecainide was discontinued on Monday and she was initiated on IV amiodarone yesterday afternoon. ? ?2.  Acute diverticulitis, no surgical/invasive procedures planned.  She has clinically improved.  On IV  Zosyn.  Afebrile. ? ?3.  CLL with chronic thrombocytopenia.  Platelets are stable. ? ?Follow-up ECG pending.  She remains in atrial fibrillation, rate controlled at this time.  Continue IV amiodarone, Lopressor, and Eliquis.  If she does not convert today, plan is for repeat DCCV tomorrow afternoon. ? ?Signed, ?Rozann Lesches, MD  ?11/23/2021, 8:15 AM    ?

## 2021-11-23 NOTE — Progress Notes (Signed)
?PROGRESS NOTE ? ? ? ? ?Daisy Black, is a 71 y.o. female, DOB - [birth year], [id number] ? ?Admit date - 11/15/2021   Admitting Physician Bernadette Hoit, DO ? ?Outpatient Primary MD for the patient is Ludwig Clarks, FNP ? ?LOS - 8 ? ?Chief Complaint  ?Patient presents with  ? Atrial Fibrillation  ? Hip Pain  ?    ? ?Subjective: ?Has no complaints today ? ?Brief Narrative:  ? 71 y.o. female with medical history significant of CLL (Follows with Dr Grayland Ormond, [facility]), A-Fib on Eliquis, GERD, Hypothyroidism admitted with acute diverticulitis and also found to be in A-fib with RVR ?Failed DC cardioversion on 11/20/2021, plans for amiodarone load followed by attempt at DC cardioversion probably on Friday, 11/24/2021 ? ?  ?-Assessment and Plan: ?1)PAF with RVR--- PTA patient was on flecanide '50mg'$  bid, lopressor '50mg'$  bid, eliquis '5mg'$  bid, ?---Rate control remains very challenging despite IV Cardizem and p.o. metoprolol and attempts at cardioversion on 11/20/2021 ?-Unable to titrate metoprolol further due to soft BP ?--11/20/2021 unsuccessful cardioversion despite multiple shocks remains in atrial fibrillation ?--Cardiologist recommends stopping flecainide and loading with amiodarone instead ?- plans for amiodarone load followed by attempt at DC cardioversion probably on Friday, 11/24/2021 ? ?-IV amiodarone was reinitiated 11/22/2021 ? ?2)Acute Diverticulitis--CT shows diverticulitis type findings in the descending and sigmoid colon ?-Persistent leukocytosis in the setting of CLL ?-Continue IV fentanyl as needed for pain control ?-IV Zosyn will complete a 7-day course  11/22/2021 ?(Still having some leukocytosis, but afebrile..  With a history of CLL) ? ?3) CLL--- patient with persistent leukocytosis ?We will discontinue IV Zosyn completed 7-day course ? ?4)GERD--continue Protonix ? ?5)Hypothyroidism--- continue levothyroxine 125 mcg daily,   ?-TSH is 1.9 ? ?6)Chronic Thrombocytopenia--- no obvious bleeding at this time,   ?-Remained stable ?-monitor closely especially while on Eliquis ? ?7) mild acute anemia--suspect hemodilutional component ?-Hgb stable ?-No acute bleeding concerns ?-Monitor H&H ? ? ?-------------------------------------------------------------------------------------------------------------- ?Code Status :  -  Code Status: Full Code  ? ?Family Communication:    Husband at bedside ? ?DVT Prophylaxis  :   - SCDs   SCDs Start: 11/15/21 2357 ?apixaban (ELIQUIS) tablet 5 mg  ? ?Lab Results  ?Component Value Date  ? PLT 126 (L) 11/22/2021  ? ?Inpatient Medications ? ?Scheduled Meds: ? apixaban  5 mg Oral BID  ? Chlorhexidine Gluconate Cloth  6 each Topical Daily  ? levothyroxine  125 mcg Oral Q0600  ? metoprolol tartrate  25 mg Oral TID  ? pantoprazole  40 mg Oral Daily  ? polyethylene glycol  17 g Oral BID  ? traZODone  50 mg Oral QHS  ? ?Continuous Infusions: ? sodium chloride Stopped (11/19/21 2207)  ? amiodarone 30 mg/hr (11/23/21 0329)  ? ?PRN Meds:.acetaminophen **OR** acetaminophen, ALPRAZolam, fentaNYL (SUBLIMAZE) injection, ondansetron (ZOFRAN) IV, oxyCODONE ? ? ?Anti-infectives (From admission, onward)  ? ? Start     Dose/Rate Route Frequency Ordered Stop  ? 11/16/21 0330  piperacillin-tazobactam (ZOSYN) IVPB 3.375 g  Status:  Discontinued       ? 3.375 g ?12.5 mL/hr over 240 Minutes Intravenous Every 8 hours 11/16/21 0242 11/23/21 0738  ? 11/15/21 2000  levofloxacin (LEVAQUIN) IVPB 750 mg       ? 750 mg ?100 mL/hr over 90 Minutes Intravenous  Once 11/15/21 1954 11/15/21 2330  ? ?  ?  ? ? ?Objective: ?Vitals:  ? 11/22/21 2000 11/22/21 2207 11/23/21 0713 11/23/21 0800  ?BP:  125/77    ?Pulse:  86 (!) 36 (!)  110  ?Resp:   12 14  ?Temp: 98 ?F (36.7 ?C)  97.7 ?F (36.5 ?C)   ?TempSrc: Oral  Oral   ?SpO2:   97% 95%  ?Black:      ?Height:      ? ? ?Intake/Output Summary (Last 24 hours) at 11/23/2021 0946 ?Last data filed at 11/22/2021 1500 ?Gross per 24 hour  ?Intake 145.84 ml  ?Output --  ?Net 145.84 ml  ? ?Filed  Weights  ? 11/20/21 0955 11/21/21 0400 11/22/21 0542  ?Black: 119.3 kg 118.6 kg 118.3 kg  ? ?Physical Exam: ?  ?General:  AAO x 3,  cooperative, no distress; dizzy  ?HEENT:  Normocephalic, PERRL, otherwise with in Normal limits   ?Neuro:  CNII-XII intact. , normal motor and sensation, reflexes intact   ?Lungs:   Clear to auscultation BL, Respirations unlabored,  ?No wheezes / crackles  ?Cardio:    S1/S2, irregularly irregular -  murmure, No Rubs or Gallops   ?Abdomen:  Soft, non-tender, bowel sounds active all four quadrants, ?no guarding or peritoneal signs.  ?Muscular  ?skeletal:  Limited exam -global generalized weaknesses ?- in bed, able to move all 4 extremities,   ?2+ pulses,  symmetric, No pitting edema  ?Skin:  Dry, warm to touch, negative for any Rashes,  ?Wounds: Please see nursing documentation ?   ? ? ?  ? ?Data Reviewed: I have personally reviewed following labs and imaging studies ? ?CBC: ?Recent Labs  ?Lab 11/17/21 ?0102 11/18/21 ?0411 11/20/21 ?0411 11/22/21 ?7253  ?WBC 15.0* 16.8* 18.7* 18.8*  ?HGB 10.8* 11.6* 11.4* 11.2*  ?HCT 36.8 38.8 37.3 36.7  ?MCV 96.8 97.5 95.2 96.1  ?PLT 119* 132* 131* 126*  ? ?Basic Metabolic Panel: ?Recent Labs  ?Lab 11/17/21 ?6644 11/18/21 ?0411 11/20/21 ?0411 11/22/21 ?0347  ?NA 143 143 144 141  ?K 3.8 3.6 4.0 4.0  ?CL 111 112* 114* 112*  ?CO2 '28 29 27 25  '$ ?GLUCOSE 117* 101* 109* 106*  ?BUN '14 11 15 14  '$ ?CREATININE 0.79 0.75 0.73 0.93  ?CALCIUM 9.1 9.4 9.6 9.4  ?MG  --  2.2  --   --   ?PHOS 3.0 3.3  --  3.3  ? ?GFR: ?Estimated Creatinine Clearance: 73.8 mL/min (by C-G formula based on SCr of 0.93 mg/dL). ?Liver Function Tests: ?Recent Labs  ?Lab 11/17/21 ?4259 11/18/21 ?0411 11/22/21 ?5638  ?ALBUMIN 3.0* 3.2* 3.2*  ? ? ? ?Recent Results (from the past 240 hour(s))  ?MRSA Next Gen by PCR, Nasal     Status: None  ? Collection Time: 11/16/21 12:15 AM  ? Specimen: Nasal Mucosa; Nasal Swab  ?Result Value Ref Range Status  ? MRSA by PCR Next Gen NOT DETECTED NOT DETECTED  Final  ?  Comment: (NOTE) ?The GeneXpert MRSA Assay (FDA approved for NASAL specimens only), ?is one component of a comprehensive MRSA colonization surveillance ?program. It is not intended to diagnose MRSA infection nor to guide ?or monitor treatment for MRSA infections. ?Test performance is not FDA approved in patients less than 2 years ?old. ?Performed at [facility], [address] [place], [place] [address] ?  ?Culture, blood (Routine X 2) w Reflex to ID Panel     Status: None  ? Collection Time: 11/16/21  3:54 AM  ? Specimen: BLOOD  ?Result Value Ref Range Status  ? Specimen Description BLOOD BLOOD RIGHT HAND  Final  ? Special Requests   Final  ?  BOTTLES DRAWN AEROBIC AND ANAEROBIC Blood Culture adequate volume  ?  Culture   Final  ?  NO GROWTH 6 DAYS ?Performed at [facility], [address] [place], [place] [address] ?  ? Report Status 11/22/2021 FINAL  Final  ?Culture, blood (Routine X 2) w Reflex to ID Panel     Status: None  ? Collection Time: 11/16/21  3:54 AM  ? Specimen: BLOOD  ?Result Value Ref Range Status  ? Specimen Description BLOOD  Final  ? Special Requests NONE  Final  ? Culture   Final  ?  NO GROWTH 6 DAYS ?Performed at [facility], [address] [place], [place] [address] ?  ? Report Status 11/22/2021 FINAL  Final  ?  ?Radiology Studies: ?No results found. ? ?Scheduled Meds: ? apixaban  5 mg Oral BID  ? Chlorhexidine Gluconate Cloth  6 each Topical Daily  ? levothyroxine  125 mcg Oral Q0600  ? metoprolol tartrate  25 mg Oral TID  ? pantoprazole  40 mg Oral Daily  ? polyethylene glycol  17 g Oral BID  ? traZODone  50 mg Oral QHS  ? ?Continuous Infusions: ? sodium chloride Stopped (11/19/21 2207)  ? amiodarone 30 mg/hr (11/23/21 0329)  ? ? ? LOS: 8 days  ? ?Borna Wessinger A M.D on 11/23/2021 at 9:46 AM ? ?

## 2021-11-24 ENCOUNTER — Encounter (HOSPITAL_COMMUNITY): Payer: Self-pay | Admitting: Internal Medicine

## 2021-11-24 ENCOUNTER — Other Ambulatory Visit: Payer: Self-pay

## 2021-11-24 ENCOUNTER — Inpatient Hospital Stay (HOSPITAL_COMMUNITY): Payer: Medicare Other | Admitting: Certified Registered"

## 2021-11-24 ENCOUNTER — Encounter (HOSPITAL_COMMUNITY)
Admission: EM | Disposition: A | Discharge status: Home / Self Care | Payer: Self-pay | Source: Home / Self Care | Attending: Family Medicine

## 2021-11-24 DIAGNOSIS — I4819 Other persistent atrial fibrillation: Secondary | ICD-10-CM | POA: Diagnosis not present

## 2021-11-24 DIAGNOSIS — I110 Hypertensive heart disease with heart failure: Secondary | ICD-10-CM

## 2021-11-24 DIAGNOSIS — I509 Heart failure, unspecified: Secondary | ICD-10-CM

## 2021-11-24 DIAGNOSIS — E039 Hypothyroidism, unspecified: Secondary | ICD-10-CM

## 2021-11-24 DIAGNOSIS — I480 Paroxysmal atrial fibrillation: Secondary | ICD-10-CM | POA: Diagnosis not present

## 2021-11-24 HISTORY — PX: CARDIOVERSION: SHX1299

## 2021-11-24 SURGERY — CARDIOVERSION
Anesthesia: General

## 2021-11-24 MED ORDER — AMIODARONE HCL 200 MG PO TABS: 200.0000 mg | ORAL_TABLET | Freq: Two times a day (BID) | ORAL | 0 refills | Status: DC

## 2021-11-24 MED ORDER — PROPOFOL 10 MG/ML IV BOLUS
INTRAVENOUS | Status: DC | PRN
  Administered 2021-11-24: 80 mg via INTRAVENOUS

## 2021-11-24 MED ORDER — METOPROLOL TARTRATE 25 MG PO TABS: 25.0000 mg | ORAL_TABLET | Freq: Two times a day (BID) | ORAL | Status: DC

## 2021-11-24 MED ORDER — AMIODARONE HCL 200 MG PO TABS
200.0000 mg | ORAL_TABLET | Freq: Two times a day (BID) | ORAL | Status: DC
  Administered 2021-11-24: 200 mg via ORAL
  Filled 2021-11-24: qty 1

## 2021-11-24 MED ORDER — LACTATED RINGERS IV SOLN: INTRAVENOUS | Status: DC | PRN

## 2021-11-24 MED ORDER — METOPROLOL TARTRATE 25 MG PO TABS: 25.0000 mg | ORAL_TABLET | Freq: Two times a day (BID) | ORAL | 0 refills | Status: DC

## 2021-11-24 SURGICAL SUPPLY — 2 items
GLOVE BIOGEL PI IND STRL 7.0 (GLOVE) ×2 IMPLANT
GLOVE BIOGEL PI INDICATOR 7.0 (GLOVE) ×2

## 2021-11-24 NOTE — Progress Notes (Signed)
Per Dr.McDowell hold patient's AM dose of Metoprolol, has scheduled cardioversion this afternoon ?

## 2021-11-24 NOTE — Anesthesia Procedure Notes (Signed)
Date/Time: 11/24/2021 12:51 PM ?Performed by: Karna Dupes, CRNA ?Pre-anesthesia Checklist: Patient identified, Emergency Drugs available, Suction available and Patient being monitored ?Oxygen Delivery Method: Nasal cannula ? ? ? ? ?

## 2021-11-24 NOTE — Progress Notes (Signed)
Barbara from day surgery just called to report they will be up shortly to transport patient down for cardioversion, patient made aware  ?

## 2021-11-24 NOTE — CV Procedure (Signed)
Elective direct-current cardioversion ? ?Indication: Persistent atrial fibrillation ? ?Description of procedure: After informed consent was obtained the patient was taken to the PACU where a timeout was performed.  Anterior and posterior pads were placed in standard fashion and connected to a biphasic defibrillator.  Deep sedation was achieved with use of propofol, administered and monitored by the anesthesia service.  A sandbag was placed on the anterior chest pad.  An initial 150 J synchronized shock was delivered with atrial fibrillation persisting thereafter.  A second 200 J synchronized shock was delivered, again with atrial fibrillation persisting thereafter.  A third 200 J shock was delivered with conversion to sinus rhythm.  Patient remained hemodynamically stable throughout and there were no immediate complications.  Sinus rhythm confirmed by ECG. ? ?Disposition: After appropriate monitoring patient will be returned to her bed in the ICU.  Anticipate conversion from IV amiodarone to oral dosing with continuation of beta-blocker and Eliquis. ? ?Satira Sark, M.D., F.A.C.C. ? ?

## 2021-11-24 NOTE — Progress Notes (Signed)
? ?Progress Note ? ?Patient Name: Daisy Black ?Date of Encounter: 11/24/2021 ? ?Primary Cardiologist: Rozann Lesches, MD ? ?Subjective  ? ?No shortness of breath or palpitations.  Was up in her room some yesterday.  Prepared for cardioversion attempt later this afternoon.  She states that she really would like to go home. ? ?Inpatient Medications  ?  ?Scheduled Meds: ? apixaban  5 mg Oral BID  ? Chlorhexidine Gluconate Cloth  6 each Topical Daily  ? levothyroxine  125 mcg Oral Q0600  ? metoprolol tartrate  25 mg Oral TID  ? pantoprazole  40 mg Oral Daily  ? polyethylene glycol  17 g Oral BID  ? traZODone  50 mg Oral QHS  ? ?Continuous Infusions: ? sodium chloride Stopped (11/19/21 2207)  ? amiodarone 30 mg/hr (11/23/21 1613)  ? ?PRN Meds: ?acetaminophen **OR** acetaminophen, ALPRAZolam, fentaNYL (SUBLIMAZE) injection, ondansetron (ZOFRAN) IV, oxyCODONE  ? ?Vital Signs  ?  ?Vitals:  ? 11/24/21 0400 11/24/21 0500 11/24/21 0600 11/24/21 0712  ?BP: (!) 82/47 (!) 74/46 (!) 94/48   ?Pulse: 79 76 62   ?Resp: (!) '25 17 13   '$ ?Temp: 97.6 ?F (36.4 ?C)   97.6 ?F (36.4 ?C)  ?TempSrc: Oral   Oral  ?SpO2: 100% 98% 99%   ?Weight:  119 kg    ?Height:      ? ? ?Intake/Output Summary (Last 24 hours) at 11/24/2021 0844 ?Last data filed at 11/24/2021 0600 ?Gross per 24 hour  ?Intake 864.93 ml  ?Output --  ?Net 864.93 ml  ? ?Filed Weights  ? 11/21/21 0400 11/22/21 0542 11/24/21 0500  ?Weight: 118.6 kg 118.3 kg 119 kg  ? ? ?Telemetry  ?  ?Rate controlled atrial fibrillation.  Personally reviewed. ? ?ECG  ?  ?An ECG dated 11/23/2021 was personally reviewed today and demonstrated:  Rate controlled atrial fibrillation. ? ?Physical Exam  ? ?GEN: No acute distress.   ?Neck: No JVD. ?Cardiac: Irregularly irregular without gallop.  ?Respiratory: Nonlabored. Clear to auscultation bilaterally. ?MS: No edema. ? ?Labs  ?  ?Chemistry ?Recent Labs  ?Lab 11/18/21 ?0411 11/20/21 ?0411 11/22/21 ?7416  ?NA 143 144 141  ?K 3.6 4.0 4.0  ?CL 112* 114*  112*  ?CO2 '29 27 25  '$ ?GLUCOSE 101* 109* 106*  ?BUN '11 15 14  '$ ?CREATININE 0.75 0.73 0.93  ?CALCIUM 9.4 9.6 9.4  ?ALBUMIN 3.2*  --  3.2*  ?GFRNONAA >60 >60 >60  ?ANIONGAP 2* 3* 4*  ?  ? ?Hematology ?Recent Labs  ?Lab 11/18/21 ?0411 11/20/21 ?0411 11/22/21 ?3845  ?WBC 16.8* 18.7* 18.8*  ?RBC 3.98 3.92 3.82*  ?HGB 11.6* 11.4* 11.2*  ?HCT 38.8 37.3 36.7  ?MCV 97.5 95.2 96.1  ?MCH 29.1 29.1 29.3  ?MCHC 29.9* 30.6 30.5  ?RDW 15.2 15.1 15.3  ?PLT 132* 131* 126*  ? ? ?Radiology  ?  ?No results found. ? ?Cardiac Studies  ? ?Echocardiogram 06/29/2020: ? 1. Left ventricular ejection fraction, by estimation, is 60 to 65%. The  ?left ventricle has normal function. The left ventricle has no regional  ?wall motion abnormalities. There is mild left ventricular hypertrophy.  ?Left ventricular diastolic parameters  ?are consistent with Grade I diastolic dysfunction (impaired relaxation).  ? 2. Right ventricular systolic function is normal. The right ventricular  ?size is normal.  ? 3. The mitral valve is normal in structure. No evidence of mitral valve  ?regurgitation. No evidence of mitral stenosis.  ? 4. The aortic valve is tricuspid. Aortic valve regurgitation is not  ?  visualized. No aortic stenosis is present.  ? 5. The inferior vena cava is normal in size with greater than 50%  ?respiratory variability, suggesting right atrial pressure of 3 mmHg.  ? ?Assessment & Plan  ?  ?1.  Persistent atrial fibrillation presenting with RVR in the setting of acute diverticulitis.  She has a history of paroxysmal atrial fibrillation and had generally done well on flecainide since 2017.  Underwent attempted cardioversion Monday which failed and plan is now to convert to IV amiodarone with repeat cardioversion attempt.  She remains on Eliquis and Lopressor.  CHA2DS2-VASc score is 3.  Flecainide was discontinued on Monday and she is on IV amiodarone now in anticipation of repeat cardioversion attempt. ? ?2.  Acute diverticulitis, no  surgical/invasive procedures planned.  She has clinically improved.  On IV Zosyn.  Afebrile. ? ?3.  CLL with chronic thrombocytopenia.  Platelets are stable. ? ?Plan is to proceed with cardioversion attempt this afternoon as scheduled on IV amiodarone.  Hold a.m. Lopressor to avoid post conversion bradycardia.  If cardioversion attempt is not successful on amiodarone, would then plan on rate control strategy and anticoagulation and discharge most likely.  We can get her set up in the atrial fibrillation clinic to discuss possibility of Tikosyn versus potentially EP evaluation for ablation. ? ?Signed, ?Rozann Lesches, MD  ?11/24/2021, 8:44 AM    ?

## 2021-11-24 NOTE — Transfer of Care (Signed)
Immediate Anesthesia Transfer of Care Note ? ?Patient: Daisy Black ? ?Procedure(s) Performed: CARDIOVERSION ? ?Patient Location: PACU ? ?Anesthesia Type:General ? ?Level of Consciousness: awake ? ?Airway & Oxygen Therapy: Patient Spontanous Breathing and on [place] [facility] [place] ? ?Post-op Assessment: Report given to RN and Post -op Vital signs reviewed and stable ? ?Post vital signs: Reviewed and stable ? ?Last Vitals:  ?Vitals Value Taken Time  ?BP 102/54   ?Temp 98.2   ?Pulse 69   ?Resp 17   ?SpO2 98%   ? ? ?Last Pain:  ?Vitals:  ? 11/24/21 1234  ?TempSrc: Oral  ?PainSc: 5   ?   ? ?Patients Stated Pain Goal: 0 (11/23/21 1244) ? ?Complications: No notable events documented. ?

## 2021-11-24 NOTE — Progress Notes (Signed)
D/c instructions, paperwork, & hard Rxs provided to patient & patients family, transported patient to vehicle & husband driving her home  ?

## 2021-11-24 NOTE — Progress Notes (Signed)
Consent for cardioversion due to persistent atrial fibrillation with Satira Sark, MD obtained & placed in patient's chart  ?

## 2021-11-24 NOTE — Discharge Summary (Signed)
Physician Discharge Summary  ?Daisy Black XLK:[id number] DOB: 1950-12-20 DOA: 11/15/2021 ? ?PCP: Ludwig Clarks, FNP ? ?Admit date: 11/15/2021 ?Discharge date: 11/24/2021 ? ?Time spent: 35 minutes ? ?Recommendations for Outpatient Follow-up:  ?Follow-up with primary care physician in 1 to 2 weeks ?Follow-up with cardiology in 2 to 4 weeks as scheduled ? ?Discharge Diagnoses:  ?Principal Problem: ?  Paroxysmal atrial fibrillation with RVR ([place]) ?Active Problems: ?  Acute diverticulitis ?  Thrombocytopenia ([place]) ?  Leukocytosis ?  CLL (chronic lymphocytic leukemia) ([place]) ?  GERD (gastroesophageal reflux disease) ?  Left sided abdominal pain ?  Hypothyroidism ?  Acute diastolic CHF (congestive heart failure) ([place]) ? ? ?Discharge Condition: Stable ? ? ?Filed Weights  ? 11/22/21 0542 11/24/21 0500 11/24/21 1234  ?Weight: 118.3 kg 119 kg 119 kg  ? ? ?History of present illness:  ? 71 y.o. female with medical history significant of CLL (Follows with Dr Grayland Ormond, [facility]), A-Fib on Eliquis, GERD, Hypothyroidism admitted with acute diverticulitis and also found to be in A-fib with RVR ?Failed DC cardioversion on 11/20/2021, plans for amiodarone load followed by attempt at DC cardioversion probably on Friday, 11/24/2021 which was successful.  Patient discharged in stable and improved condition.  She is to follow-up with primary care physician in 1 to 2 weeks.  She will also be set up to follow-up with cardiology especially possibly EP cardiologist to set up for starting Tikosyn as an outpatient if needed.  Patient being discharged in stable and improved condition.  Instructed to take Amio 200 mg twice a day for 5 days then switch to only once a day. ? ?Hospital Course:  ?1)PAF with RVR--- PTA patient was on flecanide '50mg'$  bid, lopressor '50mg'$  bid, eliquis '5mg'$  bid, ?---Rate control remains very challenging despite IV Cardizem and p.o. metoprolol and attempts at cardioversion on 11/20/2021 ?-Unable to titrate metoprolol further due  to soft BP ?--11/20/2021 unsuccessful cardioversion despite multiple shocks remains in atrial fibrillation ?--Cardiologist recommends stopping flecainide and loading with amiodarone instead ?- plans for amiodarone load followed by attempt at DC cardioversion probably on Friday, 11/24/2021 ?-Cardioversion 11/24/2021 successful ?   ?2)Acute Diverticulitis--CT shows diverticulitis type findings in the descending and sigmoid colon ?-Persistent leukocytosis in the setting of CLL ?-IV Zosyn will complete a 7-day course  11/22/2021 ?(Still having some leukocytosis, but afebrile..  With a history of CLL) ?  ?3) CLL--- patient with persistent leukocytosis ?We will discontinue IV Zosyn completed 7-day course ?  ?4)GERD--continue Protonix ?  ?5)Hypothyroidism--- continue levothyroxine 125 mcg daily,   ?-TSH is 1.9 ?  ?6)Chronic Thrombocytopenia--- no obvious bleeding at this time,  ?-Remained stable ?-monitor closely especially while on Eliquis ?  ?7) mild acute anemia--suspect hemodilutional component ?-Hgb stable ?-No acute bleeding concerns ?-Monitor H&H ?  ? ?Discharge Exam: ?Vitals:  ? 11/24/21 1315 11/24/21 1500  ?BP: (!) 103/48   ?Pulse: 68 73  ?Resp: 17 19  ?Temp:    ?SpO2: 99% 100%  ? ? ?General: Alert and oriented no apparent distress ?Cardiovascular: Irregular rate regular rhythm without murmurs rubs or gallops ?Respiratory: Clear to auscultation bilaterally no wheezes rhonchi rales ? ?Discharge Instructions ? ? ?Discharge Instructions   ? ? Diet - low sodium heart healthy   Complete by: As directed ?  ? Discharge instructions   Complete by: As directed ?  ? Follow-up with cardiology as instructed ? ?Follow-up with primary care physician in 1 to 2 weeks  ? Increase activity slowly   Complete by: As directed ?  ? ?  ? ?  Allergies as of 11/24/2021   ? ?   Reactions  ? Flagyl [metronidazole]   ? Classic side effect of severe nausea, unable to tolerate.   ? ?  ? ?  ?Medication List  ?  ? ?STOP taking these medications    ? ?flecainide 50 MG tablet ?Commonly known as: TAMBOCOR ?  ?sucralfate 1 GM/10ML suspension ?Commonly known as: CARAFATE ?  ? ?  ? ?TAKE these medications   ? ?acetaminophen 650 MG CR tablet ?Commonly known as: TYLENOL ?Take 650-1,300 mg by mouth daily as needed for pain. ?  ?amiodarone 200 MG tablet ?Commonly known as: PACERONE ?Take 1 tablet (200 mg total) by mouth 2 (two) times daily. Take 200 mg twice a day for 5 days then only once a day ?  ?apixaban 5 MG Tabs tablet ?Commonly known as: ELIQUIS ?Take 1 tablet (5 mg total) by mouth 2 (two) times daily. ?  ?CHLOR-TABLETS PO ?Take 2 tablets by mouth daily. ?  ?docusate sodium 100 MG capsule ?Commonly known as: COLACE ?Take 100 mg by mouth daily. ?  ?fluticasone 50 MCG/ACT nasal spray ?Commonly known as: FLONASE ?Place 1 spray into both nostrils daily as needed for allergies or rhinitis. ?  ?furosemide 20 MG tablet ?Commonly known as: LASIX ?TAKE 2 TABLETS (40 MG TOTAL) BY MOUTH DAILY. ?What changed:  ?when to take this ?reasons to take this ?  ?HYDROcodone-acetaminophen 5-325 MG tablet ?Commonly known as: NORCO/VICODIN ?Take 1 tablet by mouth every 4 (four) hours as needed. ?What changed: Another medication with the same name was removed. Continue taking this medication, and follow the directions you see here. ?  ?hydrocortisone 2.5 % rectal cream ?Commonly known as: ANUSOL-HC ?Place 1 application rectally 2 (two) times daily. ?  ?levothyroxine 125 MCG tablet ?Commonly known as: SYNTHROID ?Take 125 mcg by mouth daily before breakfast. ?  ?metoprolol tartrate 25 MG tablet ?Commonly known as: LOPRESSOR ?Take 1 tablet (25 mg total) by mouth 2 (two) times daily. ?What changed:  ?medication strength ?how much to take ?  ?pantoprazole 40 MG tablet ?Commonly known as: PROTONIX ?TAKE ONE TABLET BY MOUTH TWO TIMES A DAY BEFORE A MEAL ?  ?promethazine 25 MG tablet ?Commonly known as: PHENERGAN ?Take 1 tablet (25 mg total) by mouth every 6 (six) hours as needed for nausea  or vomiting. ?  ? ?  ? ?Allergies  ?Allergen Reactions  ? Flagyl [Metronidazole]   ?  Classic side effect of severe nausea, unable to tolerate.   ? ? Follow-up Information   ? ? Erma Heritage, PA-C Follow up on 12/01/2021.   ?Specialties: Physician Assistant, Cardiology ?Why: [facility] Follow-up on 12/01/2021 at 3:30 PM. ?Contact information: ?[address] ?[place] [address] ?[phone] ? ? ?  ?  ? ?  ?  ? ?  ? ? ? ?The results of significant diagnostics from this hospitalization (including imaging, microbiology, ancillary and laboratory) are listed below for reference.   ? ?Significant Diagnostic Studies: ?CT Abdomen Pelvis W Contrast ? ?Result Date: 11/15/2021 ?CLINICAL DATA:  Left lower quadrant abdominal pain, nausea for 4 days, history of CLL EXAM: CT ABDOMEN AND PELVIS WITH CONTRAST TECHNIQUE: Multidetector CT imaging of the abdomen and pelvis was performed using the standard protocol following bolus administration of intravenous contrast. RADIATION DOSE REDUCTION: This exam was performed according to the departmental dose-optimization program which includes automated exposure control, adjustment of the mA and/or kV according to patient size and/or use of iterative reconstruction technique. CONTRAST:  157m OMNIPAQUE IOHEXOL 300 MG/ML  SOLN COMPARISON:  06/28/2021, 01/11/2021 FINDINGS: Lower chest: No acute pleural or parenchymal lung disease. Hepatobiliary: Cholecystectomy. Liver is unremarkable. No biliary duct dilation. Pancreas: Unremarkable. No pancreatic ductal dilatation or surrounding inflammatory changes. Spleen: Stable splenomegaly, measuring 16.4 by 13.8 by 6.0 cm. No focal parenchymal abnormality. Adrenals/Urinary Tract: Adrenals are unremarkable. Kidneys enhance normally and symmetrically. No urinary tract calculi or obstructive uropathy. Bladder is minimally distended without focal abnormality. Stomach/Bowel: Scattered diverticulosis of the colon. Wall thickening and pericolonic  fat stranding at the junction of the descending and sigmoid colon consistent with acute uncomplicated diverticulitis. No perforation, fluid collection, or abscess. No bowel obstruction or ileus. Normal appen

## 2021-11-24 NOTE — Care Management Important Message (Signed)
Important Message ? ?Patient Details  ?Name: Daisy Black ?MRN: [id number] ?Date of Birth: 05-06-1951 ? ? ?Medicare Important Message Given:  Yes ? ? ? ? ?Tommy Medal ?11/24/2021, 2:30 PM ?

## 2021-11-24 NOTE — H&P (View-Only) (Signed)
? ?Progress Note ? ?Patient Name: Daisy Black ?Date of Encounter: 11/24/2021 ? ?Primary Cardiologist: Rozann Lesches, MD ? ?Subjective  ? ?No shortness of breath or palpitations.  Was up in her room some yesterday.  Prepared for cardioversion attempt later this afternoon.  She states that she really would like to go home. ? ?Inpatient Medications  ?  ?Scheduled Meds: ? apixaban  5 mg Oral BID  ? Chlorhexidine Gluconate Cloth  6 each Topical Daily  ? levothyroxine  125 mcg Oral Q0600  ? metoprolol tartrate  25 mg Oral TID  ? pantoprazole  40 mg Oral Daily  ? polyethylene glycol  17 g Oral BID  ? traZODone  50 mg Oral QHS  ? ?Continuous Infusions: ? sodium chloride Stopped (11/19/21 2207)  ? amiodarone 30 mg/hr (11/23/21 1613)  ? ?PRN Meds: ?acetaminophen **OR** acetaminophen, ALPRAZolam, fentaNYL (SUBLIMAZE) injection, ondansetron (ZOFRAN) IV, oxyCODONE  ? ?Vital Signs  ?  ?Vitals:  ? 11/24/21 0400 11/24/21 0500 11/24/21 0600 11/24/21 0712  ?BP: (!) 82/47 (!) 74/46 (!) 94/48   ?Pulse: 79 76 62   ?Resp: (!) '25 17 13   '$ ?Temp: 97.6 ?F (36.4 ?C)   97.6 ?F (36.4 ?C)  ?TempSrc: Oral   Oral  ?SpO2: 100% 98% 99%   ?Weight:  119 kg    ?Height:      ? ? ?Intake/Output Summary (Last 24 hours) at 11/24/2021 0844 ?Last data filed at 11/24/2021 0600 ?Gross per 24 hour  ?Intake 864.93 ml  ?Output --  ?Net 864.93 ml  ? ?Filed Weights  ? 11/21/21 0400 11/22/21 0542 11/24/21 0500  ?Weight: 118.6 kg 118.3 kg 119 kg  ? ? ?Telemetry  ?  ?Rate controlled atrial fibrillation.  Personally reviewed. ? ?ECG  ?  ?An ECG dated 11/23/2021 was personally reviewed today and demonstrated:  Rate controlled atrial fibrillation. ? ?Physical Exam  ? ?GEN: No acute distress.   ?Neck: No JVD. ?Cardiac: Irregularly irregular without gallop.  ?Respiratory: Nonlabored. Clear to auscultation bilaterally. ?MS: No edema. ? ?Labs  ?  ?Chemistry ?Recent Labs  ?Lab 11/18/21 ?0411 11/20/21 ?0411 11/22/21 ?9563  ?NA 143 144 141  ?K 3.6 4.0 4.0  ?CL 112* 114*  112*  ?CO2 '29 27 25  '$ ?GLUCOSE 101* 109* 106*  ?BUN '11 15 14  '$ ?CREATININE 0.75 0.73 0.93  ?CALCIUM 9.4 9.6 9.4  ?ALBUMIN 3.2*  --  3.2*  ?GFRNONAA >60 >60 >60  ?ANIONGAP 2* 3* 4*  ?  ? ?Hematology ?Recent Labs  ?Lab 11/18/21 ?0411 11/20/21 ?0411 11/22/21 ?8756  ?WBC 16.8* 18.7* 18.8*  ?RBC 3.98 3.92 3.82*  ?HGB 11.6* 11.4* 11.2*  ?HCT 38.8 37.3 36.7  ?MCV 97.5 95.2 96.1  ?MCH 29.1 29.1 29.3  ?MCHC 29.9* 30.6 30.5  ?RDW 15.2 15.1 15.3  ?PLT 132* 131* 126*  ? ? ?Radiology  ?  ?No results found. ? ?Cardiac Studies  ? ?Echocardiogram 06/29/2020: ? 1. Left ventricular ejection fraction, by estimation, is 60 to 65%. The  ?left ventricle has normal function. The left ventricle has no regional  ?wall motion abnormalities. There is mild left ventricular hypertrophy.  ?Left ventricular diastolic parameters  ?are consistent with Grade I diastolic dysfunction (impaired relaxation).  ? 2. Right ventricular systolic function is normal. The right ventricular  ?size is normal.  ? 3. The mitral valve is normal in structure. No evidence of mitral valve  ?regurgitation. No evidence of mitral stenosis.  ? 4. The aortic valve is tricuspid. Aortic valve regurgitation is not  ?  visualized. No aortic stenosis is present.  ? 5. The inferior vena cava is normal in size with greater than 50%  ?respiratory variability, suggesting right atrial pressure of 3 mmHg.  ? ?Assessment & Plan  ?  ?1.  Persistent atrial fibrillation presenting with RVR in the setting of acute diverticulitis.  She has a history of paroxysmal atrial fibrillation and had generally done well on flecainide since 2017.  Underwent attempted cardioversion Monday which failed and plan is now to convert to IV amiodarone with repeat cardioversion attempt.  She remains on Eliquis and Lopressor.  CHA2DS2-VASc score is 3.  Flecainide was discontinued on Monday and she is on IV amiodarone now in anticipation of repeat cardioversion attempt. ? ?2.  Acute diverticulitis, no  surgical/invasive procedures planned.  She has clinically improved.  On IV Zosyn.  Afebrile. ? ?3.  CLL with chronic thrombocytopenia.  Platelets are stable. ? ?Plan is to proceed with cardioversion attempt this afternoon as scheduled on IV amiodarone.  Hold a.m. Lopressor to avoid post conversion bradycardia.  If cardioversion attempt is not successful on amiodarone, would then plan on rate control strategy and anticoagulation and discharge most likely.  We can get her set up in the atrial fibrillation clinic to discuss possibility of Tikosyn versus potentially EP evaluation for ablation. ? ?Signed, ?Rozann Lesches, MD  ?11/24/2021, 8:44 AM    ?

## 2021-11-24 NOTE — Progress Notes (Signed)
Electrical Cardioversion Procedure Note ?Daisy Black ?[id number] ?15-Feb-1951 ? ?Procedure: Electrical Cardioversion ?Indications:  Atrial Fibrillation ? ?Procedure Details ?Consent: Risks of procedure as well as the alternatives and risks of each were explained to the (patient/caregiver).  Consent for procedure obtained. ?Time Out: Verified patient identification, verified procedure, site/side was marked, verified correct patient position, special equipment/implants available, medications/allergies/relevent history reviewed, required imaging and test results available.  Performed  ? ?Patient placed on cardiac monitor, pulse oximetry, supplemental oxygen as necessary.  ?Sedation given:  propofol ?Pacer pads placed anterior and posterior chest. ? ?Cardioverted 3 time(s).  ?Cardioverted at 150J ,200J, 200J ? ?Evaluation ?Findings: Post procedure EKG shows: NSR ?Complications: None ?Patient did tolerate procedure well. ? ? ?[place] ?11/24/2021, 1:19 PM ? ? ? ?

## 2021-11-24 NOTE — Progress Notes (Signed)
? ?  Progress Note ? ?Patient Name: Daisy Black ?Date of Encounter: 11/24/2021 ? ?Primary Cardiologist: Rozann Lesches, MD ? ?Patient status post successful electrical cardioversion today.  From a cardiac perspective she should be stable for discharge today.  Continue Eliquis 5 mg twice daily, change Lopressor to 25 mg twice daily. Continue amiodarone 200 mg twice daily for a total of 5 days, then switch to 200 mg once daily.  We will schedule an outpatient cardiology follow-up. ? ?Signed, ?Rozann Lesches, MD  ?11/24/2021, 2:50 PM    ?

## 2021-11-24 NOTE — Progress Notes (Signed)
Patient off the unit at this time for cardioversion  ?

## 2021-11-24 NOTE — Anesthesia Postprocedure Evaluation (Signed)
Anesthesia Post Note ? ?Patient: Daisy Black ? ?Procedure(s) Performed: CARDIOVERSION ? ?Patient location during evaluation: Phase II ?Anesthesia Type: General ?Level of consciousness: awake and alert and oriented ?Pain management: pain level controlled ?Vital Signs Assessment: post-procedure vital signs reviewed and stable ?Respiratory status: spontaneous breathing, nonlabored ventilation and respiratory function stable ?Cardiovascular status: blood pressure returned to baseline and stable ?Postop Assessment: no apparent nausea or vomiting ?Anesthetic complications: no ? ? ?No notable events documented. ? ? ?Last Vitals:  ?Vitals:  ? 11/24/21 1306 11/24/21 1315  ?BP: (!) 99/51 (!) 103/48  ?Pulse: 68 68  ?Resp: 16 17  ?Temp:    ?SpO2: 99% 99%  ?  ?Last Pain:  ?Vitals:  ? 11/24/21 1234  ?TempSrc: Oral  ?PainSc: 5   ? ? ?  ?  ?  ?  ?  ?  ? ?Keyah Blizard C Durand Wittmeyer ? ? ? ? ?

## 2021-11-24 NOTE — Progress Notes (Signed)
Report received from Apolonio Schneiders, [place] in PACU, patient had successful cardioversion & will return to her room in ICU, reported current VS BP-99/51 P-68 R-16 T-98.2 oral  ?

## 2021-11-24 NOTE — Anesthesia Preprocedure Evaluation (Addendum)
Anesthesia Evaluation  ?Patient identified by MRN, date of birth, ID band ?Patient awake ? ? ? ?Reviewed: ?Allergy & Precautions, NPO status , Patient's Chart, lab work & pertinent test results, reviewed documented beta blocker date and time  ? ?Airway ?Mallampati: II ? ?TM Distance: >3 FB ?Neck ROM: Full ? ? ? Dental ? ?(+) Dental Advisory Given, Caps ?[place] :   ?Pulmonary ?asthma , former smoker,  ?  ?Pulmonary exam normal ?breath sounds clear to auscultation ? ? ? ? ? ? Cardiovascular ?Exercise Tolerance: Good ?hypertension, Pt. on medications and Pt. on home beta blockers ?+CHF  ?+ dysrhythmias Atrial Fibrillation  ?Rhythm:Irregular Rate:Tachycardia ? ? ?  ?Neuro/Psych ? Neuromuscular disease negative psych ROS  ? GI/Hepatic ?Neg liver ROS, hiatal hernia, GERD  Medicated and Controlled,  ?Endo/Other  ?Hypothyroidism Morbid obesity ? Renal/GU ?negative Renal ROS  ?negative genitourinary ?  ?Musculoskeletal ? ?(+) Arthritis , Osteoarthritis,   ? Abdominal ?  ?Peds ?negative pediatric ROS ?(+)  Hematology ? ?(+) Blood dyscrasia (CLL), anemia ,   ?Anesthesia Other Findings ? ? Reproductive/Obstetrics ?negative OB ROS ? ?  ? ? ? ? ? ? ? ? ? ? ? ? ? ?  ?  ? ? ? ? ? ? ? ? ?Anesthesia Physical ? ?Anesthesia Plan ? ?ASA: 4 ? ?Anesthesia Plan: General  ? ?Post-op Pain Management: Minimal or no pain anticipated  ? ?Induction: Intravenous ? ?PONV Risk Score and Plan: Propofol infusion ? ?Airway Management Planned: Nasal Cannula and Natural Airway ? ?Additional Equipment:  ? ?Intra-op Plan:  ? ?Post-operative Plan:  ? ?Informed Consent: I have reviewed the patients History and Physical, chart, labs and discussed the procedure including the risks, benefits and alternatives for the proposed anesthesia with the patient or authorized representative who has indicated his/her understanding and acceptance.  ? ? ? ?Dental advisory given ? ?Plan Discussed with: CRNA and Surgeon ? ?Anesthesia Plan  Comments:   ? ? ? ? ? ? ?Anesthesia Quick Evaluation ? ?

## 2021-11-24 NOTE — Interval H&P Note (Signed)
History and Physical Interval Note: ? ?11/24/2021 ?12:53 PM ? ?Daisy Black  has presented today for surgery, with the diagnosis of a-fib.  The various methods of treatment have been discussed with the patient and family. After consideration of risks, benefits and other options for treatment, the patient has consented to  Procedure(s): ?CARDIOVERSION (N/A) as a surgical intervention.  The patient's history has been reviewed, patient examined, no change in status, stable for surgery.  I have reviewed the patient's chart and labs.  Questions were answered to the patient's satisfaction.   ? ? ?Rozann Lesches ? ? ?

## 2021-11-28 ENCOUNTER — Encounter (HOSPITAL_COMMUNITY): Payer: Self-pay | Admitting: Cardiology

## 2021-12-01 ENCOUNTER — Encounter: Payer: Self-pay | Admitting: Student

## 2021-12-01 ENCOUNTER — Ambulatory Visit (INDEPENDENT_AMBULATORY_CARE_PROVIDER_SITE_OTHER): Payer: Medicare Other | Admitting: Student

## 2021-12-01 VITALS — BP 132/78 | HR 68 | Ht 68.0 in | Wt 251.0 lb

## 2021-12-01 DIAGNOSIS — E039 Hypothyroidism, unspecified: Secondary | ICD-10-CM

## 2021-12-01 DIAGNOSIS — I480 Paroxysmal atrial fibrillation: Secondary | ICD-10-CM | POA: Diagnosis not present

## 2021-12-01 DIAGNOSIS — R600 Localized edema: Secondary | ICD-10-CM | POA: Diagnosis not present

## 2021-12-01 DIAGNOSIS — B3731 Acute candidiasis of vulva and vagina: Secondary | ICD-10-CM | POA: Diagnosis not present

## 2021-12-01 MED ORDER — APIXABAN 5 MG PO TABS: 5.0000 mg | ORAL_TABLET | Freq: Two times a day (BID) | ORAL | 3 refills | Status: DC

## 2021-12-01 MED ORDER — AMIODARONE HCL 200 MG PO TABS: 200.0000 mg | ORAL_TABLET | Freq: Every day | ORAL | 2 refills | Status: DC

## 2021-12-01 MED ORDER — METOPROLOL TARTRATE 50 MG PO TABS: 50.0000 mg | ORAL_TABLET | Freq: Two times a day (BID) | ORAL | 3 refills | Status: DC

## 2021-12-01 MED ORDER — FUROSEMIDE 20 MG PO TABS: ORAL_TABLET | ORAL | 3 refills | Status: DC

## 2021-12-01 MED ORDER — FLUCONAZOLE 150 MG PO TABS: ORAL_TABLET | ORAL | 0 refills | Status: DC

## 2021-12-01 NOTE — Patient Instructions (Addendum)
Medication Instructions:  ? ?INCREASE Metoprolol to 50 mg twice a day ? ? ?Call us if you experience increased palpitations ? ? ? ?Labwork: ?None today ? ?Testing/Procedures: ?None today ? ?Follow-Up: ?Keep July appointment ? ?Any Other Special Instructions Will Be Listed Below (If Applicable). ? ?If you need a refill on your cardiac medications before your next appointment, please call your pharmacy. ? ?

## 2021-12-01 NOTE — Progress Notes (Signed)
? ?Cardiology Office Note   ? ?Date:  12/02/2021  ? ?ID:  Daisy Black, DOB 12/15/1950, MRN [id number] ? ?PCP:  Ludwig Clarks, FNP  ?Cardiologist: Rozann Lesches, MD   ? ?Chief Complaint  ?Patient presents with  ? Hospitalization Follow-up  ? ? ?History of Present Illness:   ? ?Daisy Black is a 71 y.o. female with past medical history of paroxysmal atrial fibrillation, HLD, GERD and CLL who presents to the office today for hospital follow-up. ? ?She most recently presented to [facility] ED on 11/15/2021 for evaluation of worsening left lower quadrant abdominal pain and also reported intermittent palpitations for the past week. She was found to have diverticulitis and was started on Zosyn. Cardiology was consulted due to atrial fibrillation with RVR and she was initially started on IV Cardizem for rate control but this was difficult to titrate given hypotension. She underwent an attempt at [place] on 11/20/2021 while on Flecainide but this was unsuccessful. Following a 48-hour washout of Flecainide, she was started on IV Amiodarone. She ultimately underwent repeat DCCV on 11/24/2021 and converted to normal sinus rhythm on her third shock. She was continued on Eliquis 5 mg twice daily at discharge along with Lopressor 25 mg twice daily. Was recommended to continue Amiodarone 200 mg twice daily for 5 days then switch to 200 mg daily.  ? ?In talking with the patient and her daughter today, she reports feeling weak since hospitalization but feels that this may be secondary to being in the hospital for 10 days. She does report intermittent nausea as well since her hospitalization. She has been checking her heart rate at home and felt palpitations yesterday and her heart rate was into the 130's but she took an extra Lopressor with improvement. She was previously on Lopressor 50 mg twice daily and overall tolerated this well and we discussed possible adjustment of this back to her prior dose. No specific orthopnea or  PND. She did have lower extremity edema upon hospital discharge and took Lasix for several days with improvement in symptoms. ? ?She remains on Eliquis for anticoagulation and denies any recent melena, hematochezia or hematuria. She has been suffering from a yeast infection likely due to the antibiotics she received during admission.  ? ? ?Past Medical History:  ?Diagnosis Date  ? Anemia   ? Arthritis   ? Asthma   ? CLL (chronic lymphocytic leukemia) ([place])   ? Essential hypertension   ? GERD (gastroesophageal reflux disease)   ? History of hiatal hernia   ? Hyperlipidemia   ? Paroxysmal atrial fibrillation (HCC)   ? Splenomegaly   ? ? ?Past Surgical History:  ?Procedure Laterality Date  ? ANKLE SURGERY Right   ? repair fracture  ? CARDIOVERSION N/A 03/20/2016  ? Procedure: CARDIOVERSION;  Surgeon: Satira Sark, MD;  Location: [facility];  Service: Cardiovascular;  Laterality: N/A;  ? CARDIOVERSION N/A 05/22/2016  ? Procedure: CARDIOVERSION;  Surgeon: Satira Sark, MD;  Location: [facility];  Service: Cardiovascular;  Laterality: N/A;  ? CARDIOVERSION N/A 11/20/2021  ? Procedure: CARDIOVERSION;  Surgeon: Werner Lean, MD;  Location: [facility];  Service: Cardiovascular;  Laterality: N/A;  ? CARDIOVERSION N/A 11/24/2021  ? Procedure: CARDIOVERSION;  Surgeon: Satira Sark, MD;  Location: [facility];  Service: Cardiovascular;  Laterality: N/A;  ? CHOLECYSTECTOMY    ? COLONOSCOPY WITH PROPOFOL N/A 03/27/2018  ? Internal hemorrhoids, diverticulosis in sigmoid and descending colon, three 4-6 mm polyps at IC valve. tubular  adenomas 5 year surveillance  ? ESOPHAGOGASTRODUODENOSCOPY (EGD) WITH PROPOFOL N/A 03/27/2018  ? Moderate Schatzki's ring s/p dilation, small hiatal hernia  ? HERNIA REPAIR  2011  ? Incisional and umbilical utilizing mesh  ? MALONEY DILATION N/A 03/27/2018  ? Procedure: MALONEY DILATION;  Surgeon: Daneil Dolin, MD;  Location: [facility];  Service: Endoscopy;  Laterality: N/A;  ? POLYPECTOMY   03/27/2018  ? Procedure: POLYPECTOMY;  Surgeon: Daneil Dolin, MD;  Location: [facility];  Service: Endoscopy;;  colon  ? ROTATOR CUFF REPAIR Left   ? TEE WITHOUT CARDIOVERSION N/A 01/17/2016  ? Procedure: TRANSESOPHAGEAL ECHOCARDIOGRAM (TEE);  Surgeon: Herminio Commons, MD;  Location: [facility];  Service: Cardiovascular;  Laterality: N/A;  ? TEE WITHOUT CARDIOVERSION N/A 02/21/2016  ? Procedure: TRANSESOPHAGEAL ECHOCARDIOGRAM (TEE) WITH PROPOFOL;  Surgeon: Herminio Commons, MD;  Location: [facility];  Service: Cardiovascular;  Laterality: N/A;  ? TEE WITHOUT CARDIOVERSION N/A 03/20/2016  ? Procedure: TRANSESOPHAGEAL ECHOCARDIOGRAM (TEE) WITH PROPOFOL;  Surgeon: Satira Sark, MD;  Location: [facility];  Service: Cardiovascular;  Laterality: N/A;  ? TONSILLECTOMY    ? TOTAL KNEE ARTHROPLASTY  2022  ? ? ?Current Medications: ?Outpatient Medications Prior to Visit  ?Medication Sig Dispense Refill  ? acetaminophen (TYLENOL) 650 MG CR tablet Take 650-1,300 mg by mouth daily as needed for pain.    ? Chlorpheniramine Maleate (CHLOR-TABLETS PO) Take 2 tablets by mouth daily.    ? docusate sodium (COLACE) 100 MG capsule Take 100 mg by mouth daily.    ? fluticasone (FLONASE) 50 MCG/ACT nasal spray Place 1 spray into both nostrils daily as needed for allergies or rhinitis.    ? HYDROcodone-acetaminophen (NORCO/VICODIN) 5-325 MG tablet Take 1 tablet by mouth every 4 (four) hours as needed. 14 tablet 0  ? hydrocortisone (ANUSOL-HC) 2.5 % rectal cream Place 1 application rectally 2 (two) times daily. 30 g 1  ? levothyroxine (SYNTHROID, LEVOTHROID) 125 MCG tablet Take 125 mcg by mouth daily before breakfast.    ? pantoprazole (PROTONIX) 40 MG tablet TAKE ONE TABLET BY MOUTH TWO TIMES A DAY BEFORE A MEAL 90 tablet 3  ? promethazine (PHENERGAN) 25 MG tablet Take 1 tablet (25 mg total) by mouth every 6 (six) hours as needed for nausea or vomiting. 60 tablet 1  ? amiodarone (PACERONE) 200 MG tablet Take 1 tablet (200 mg total)  by mouth 2 (two) times daily. Take 200 mg twice a day for 5 days then only once a day 60 tablet 0  ? apixaban (ELIQUIS) 5 MG TABS tablet Take 1 tablet (5 mg total) by mouth 2 (two) times daily. 180 tablet 3  ? furosemide (LASIX) 20 MG tablet TAKE 2 TABLETS (40 MG TOTAL) BY MOUTH DAILY. (Patient taking differently: Take 40 mg by mouth as needed.) 180 tablet 2  ? metoprolol tartrate (LOPRESSOR) 25 MG tablet Take 1 tablet (25 mg total) by mouth 2 (two) times daily. 60 tablet 0  ? ?Facility-Administered Medications Prior to Visit  ?Medication Dose Route Frequency Provider Last Rate Last Admin  ? hydrocortisone cream 1 % 1 application  1 application. Topical TID PRN Satira Sark, MD      ?  ? ?Allergies:   Flagyl [metronidazole]  ? ?Social History  ? ?Socioeconomic History  ? Marital status: Married  ?  Spouse name: Not on file  ? Number of children: 9  ? Years of education: Not on file  ? Highest education level: Not on file  ?Occupational  History  ? Not on file  ?Tobacco Use  ? Smoking status: Former  ?  Packs/day: 0.25  ?  Years: 2.00  ?  Pack years: 0.50  ?  Types: Cigarettes  ?  Quit date: 08/03/1975  ?  Years since quitting: 46.3  ? Smokeless tobacco: Never  ?Vaping Use  ? Vaping Use: Never used  ?Substance and Sexual Activity  ? Alcohol use: No  ?  Alcohol/week: 0.0 standard drinks  ? Drug use: No  ? Sexual activity: Yes  ?  Birth control/protection: Post-menopausal  ?Other Topics Concern  ? Not on file  ?Social History Narrative  ? Not on file  ? ?Social Determinants of Health  ? ?Financial Resource Strain: Not on file  ?Food Insecurity: Not on file  ?Transportation Needs: Not on file  ?Physical Activity: Not on file  ?Stress: Not on file  ?Social Connections: Not on file  ?  ? ?Family History:  The patient's family history includes Breast cancer in her paternal aunt; Crohn's disease in her son; Leukemia in her mother; Ovarian cancer (age of onset: 58) in her mother; Stroke (age of onset: 39) in her father.   ? ?Review of Systems:   ? ?Please see the history of present illness.    ? ?All other systems reviewed and are otherwise negative except as noted above. ? ? ?Physical Exam:   ? ?VS:  BP 132/78   Pulse 68

## 2021-12-02 ENCOUNTER — Encounter: Payer: Self-pay | Admitting: Student

## 2021-12-04 ENCOUNTER — Encounter (HOSPITAL_COMMUNITY): Payer: Self-pay | Admitting: *Deleted

## 2021-12-04 ENCOUNTER — Other Ambulatory Visit: Payer: Self-pay

## 2021-12-04 ENCOUNTER — Emergency Department (HOSPITAL_COMMUNITY)
Admission: EM | Admit: 2021-12-04 | Discharge: 2021-12-04 | Disposition: A | Discharge status: Home / Self Care | Payer: Medicare Other | Attending: Emergency Medicine | Admitting: Emergency Medicine

## 2021-12-04 ENCOUNTER — Telehealth: Payer: Self-pay | Admitting: Student

## 2021-12-04 ENCOUNTER — Emergency Department (HOSPITAL_COMMUNITY): Payer: Medicare Other

## 2021-12-04 DIAGNOSIS — R630 Anorexia: Secondary | ICD-10-CM | POA: Insufficient documentation

## 2021-12-04 DIAGNOSIS — Z7901 Long term (current) use of anticoagulants: Secondary | ICD-10-CM | POA: Insufficient documentation

## 2021-12-04 DIAGNOSIS — E876 Hypokalemia: Secondary | ICD-10-CM

## 2021-12-04 DIAGNOSIS — R197 Diarrhea, unspecified: Secondary | ICD-10-CM | POA: Diagnosis not present

## 2021-12-04 DIAGNOSIS — R109 Unspecified abdominal pain: Secondary | ICD-10-CM | POA: Diagnosis present

## 2021-12-04 LAB — COMPREHENSIVE METABOLIC PANEL
ALT: 17 U/L (ref 0–44)
AST: 29 U/L (ref 15–41)
Albumin: 4.4 g/dL (ref 3.5–5.0)
Alkaline Phosphatase: 128 U/L — ABNORMAL HIGH (ref 38–126)
Anion gap: 10 (ref 5–15)
BUN: 16 mg/dL (ref 8–23)
CO2: 24 mmol/L (ref 22–32)
Calcium: 10.4 mg/dL — ABNORMAL HIGH (ref 8.9–10.3)
Chloride: 107 mmol/L (ref 98–111)
Creatinine, Ser: 0.99 mg/dL (ref 0.44–1.00)
GFR, Estimated: >60 mL/min (ref >60)
Glucose, Bld: 98 mg/dL (ref 70–99)
Potassium: 3.4 mmol/L — ABNORMAL LOW (ref 3.5–5.1)
Sodium: 141 mmol/L (ref 135–145)
Total Bilirubin: 1 mg/dL (ref 0.3–1.2)
Total Protein: 7.1 g/dL (ref 6.5–8.1)

## 2021-12-04 LAB — CBC WITH DIFFERENTIAL/PLATELET
Basophils Absolute: 0 10*3/uL (ref 0.0–0.1)
Basophils Relative: 0 %
Eosinophils Absolute: 0.3 10*3/uL (ref 0.0–0.5)
Eosinophils Relative: 1 %
HCT: 45.2 % (ref 36.0–46.0)
Hemoglobin: 14 g/dL (ref 12.0–15.0)
Lymphocytes Relative: 91 %
Lymphs Abs: 25.8 10*3/uL — ABNORMAL HIGH (ref 0.7–4.0)
MCH: 29 pg (ref 26.0–34.0)
MCHC: 31 g/dL (ref 30.0–36.0)
MCV: 93.8 fL (ref 80.0–100.0)
Monocytes Absolute: 0 10*3/uL — ABNORMAL LOW (ref 0.1–1.0)
Monocytes Relative: 0 %
Neutro Abs: 2.3 10*3/uL (ref 1.7–7.7)
Neutrophils Relative %: 8 %
Platelets: 149 10*3/uL — ABNORMAL LOW (ref 150–400)
RBC: 4.82 MIL/uL (ref 3.87–5.11)
RDW: 15.1 % (ref 11.5–15.5)
WBC: 28.3 10*3/uL — ABNORMAL HIGH (ref 4.0–10.5)
nRBC: 0 % (ref 0.0–0.2)

## 2021-12-04 LAB — LIPASE, BLOOD: Lipase: 35 U/L (ref 11–51)

## 2021-12-04 MED ORDER — POTASSIUM CHLORIDE CRYS ER 20 MEQ PO TBCR: 20.0000 meq | EXTENDED_RELEASE_TABLET | Freq: Every day | ORAL | 0 refills | Status: DC

## 2021-12-04 MED ORDER — SODIUM CHLORIDE 0.9 % IV SOLN: INTRAVENOUS | Status: DC

## 2021-12-04 MED ORDER — METOPROLOL TARTRATE 25 MG PO TABS: 25.0000 mg | ORAL_TABLET | Freq: Two times a day (BID) | ORAL | 3 refills | Status: DC

## 2021-12-04 MED ORDER — IOHEXOL 300 MG/ML  SOLN
100.0000 mL | Freq: Once | INTRAMUSCULAR | Status: AC | PRN
  Administered 2021-12-04: 100 mL via INTRAVENOUS

## 2021-12-04 NOTE — Discharge Instructions (Addendum)
There were no serious complications found on testing today.  Your potassium level slightly low so we are prescribing potassium to take to improve that.  Follow-up with your primary care doctor next week for checkup and repeat potassium testing.  Return here, if needed ?

## 2021-12-04 NOTE — ED Notes (Signed)
EDP into room 

## 2021-12-04 NOTE — ED Provider Notes (Signed)
?[facility] ?Provider Note ? ? ?CSN: [id number] ?Arrival date & time: 12/04/21  0646 ? ?  ? ?History ? ?No chief complaint on file. ? ? ?Daisy Black is a 71 y.o. female. ? ?HPI ?She presents for evaluation of abdominal pain with loose bowel movements and decreased appetite, for several days.  These are recurrent symptoms, similar to when she was last diagnosed with diverticulitis in April 2023.  At that time she was hospitalized and treated with antibiotics as well as had intervention for A-fib with RVR.  This has been stabilized.  She has ongoing chronic abdominal pain and follows closely with GI.  She was due to have EGD and possible esophageal dilatation, for ongoing dysphagia and abdominal pain.  She is not currently complaining of dysphagia.  She tried altering diet after 24 hours of fasting but has not had relief of her symptoms.  She denies blood in stool. ?  ? ?Home Medications ?Prior to Admission medications   ?Medication Sig Start Date End Date Taking? Authorizing Provider  ?acetaminophen (TYLENOL) 650 MG CR tablet Take 650-1,300 mg by mouth daily as needed for pain.    [provider]  ?amiodarone (PACERONE) 200 MG tablet Take 1 tablet (200 mg total) by mouth daily. 12/01/21 08/28/22  Strader, Fransisco Hertz, PA-C  ?apixaban (ELIQUIS) 5 MG TABS tablet Take 1 tablet (5 mg total) by mouth 2 (two) times daily. 12/01/21   Erma Heritage, PA-C  ?Chlorpheniramine Maleate (CHLOR-TABLETS PO) Take 2 tablets by mouth daily.    [provider]  ?docusate sodium (COLACE) 100 MG capsule Take 100 mg by mouth daily.    [provider]  ?fluconazole (DIFLUCAN) 150 MG tablet Take 1 tablet on Day 1. Can repeat on Day 3 if symptoms not improved. 12/01/21   Strader, Fransisco Hertz, PA-C  ?fluticasone (FLONASE) 50 MCG/ACT nasal spray Place 1 spray into both nostrils daily as needed for allergies or rhinitis.    [provider]  ?furosemide (LASIX) 20 MG tablet Take 40 mg daily  as needed 12/01/21   Ahmed Prima, Fransisco Hertz, PA-C  ?HYDROcodone-acetaminophen (NORCO/VICODIN) 5-325 MG tablet Take 1 tablet by mouth every 4 (four) hours as needed. 06/29/21   Isla Pence, MD  ?hydrocortisone (ANUSOL-HC) 2.5 % rectal cream Place 1 application rectally 2 (two) times daily. 08/10/21   Annitta Needs, NP  ?levothyroxine (SYNTHROID, LEVOTHROID) 125 MCG tablet Take 125 mcg by mouth daily before breakfast.    [provider]  ?metoprolol tartrate (LOPRESSOR) 50 MG tablet Take 1 tablet (50 mg total) by mouth 2 (two) times daily. 12/01/21 11/26/22  Strader, Fransisco Hertz, PA-C  ?pantoprazole (PROTONIX) 40 MG tablet TAKE ONE TABLET BY MOUTH TWO TIMES A DAY BEFORE A MEAL 08/10/21   Annitta Needs, NP  ?promethazine (PHENERGAN) 25 MG tablet Take 1 tablet (25 mg total) by mouth every 6 (six) hours as needed for nausea or vomiting. 09/12/21   Lloyd Huger, MD  ?   ? ?Allergies    ?Flagyl [metronidazole]   ? ?Review of Systems   ?Review of Systems ? ?Physical Exam ?Updated Vital Signs ?There were no vitals taken for this visit. ?Physical Exam ?Vitals and nursing note reviewed.  ?Constitutional:   ?   General: She is not in acute distress. ?   Appearance: She is well-developed. She is obese. She is not ill-appearing, toxic-appearing or diaphoretic.  ?HENT:  ?   Head: Normocephalic and atraumatic.  ?   Right Ear: External  ear normal.  ?   Left Ear: External ear normal.  ?Eyes:  ?   Conjunctiva/sclera: Conjunctivae normal.  ?   Pupils: Pupils are equal, round, and reactive to light.  ?Neck:  ?   Trachea: Phonation normal.  ?Cardiovascular:  ?   Rate and Rhythm: Normal rate.  ?Pulmonary:  ?   Effort: Pulmonary effort is normal.  ?Abdominal:  ?   General: There is no distension.  ?Musculoskeletal:     ?   General: Normal range of motion.  ?   Cervical back: Normal range of motion and neck supple.  ?Skin: ?   General: Skin is warm and dry.  ?Neurological:  ?   Mental Status: She is alert and oriented to person,  place, and time.  ?   Cranial Nerves: No cranial nerve deficit.  ?   Sensory: No sensory deficit.  ?   Motor: No abnormal muscle tone.  ?   Coordination: Coordination normal.  ?Psychiatric:     ?   Mood and Affect: Mood normal.     ?   Behavior: Behavior normal.     ?   Thought Content: Thought content normal.     ?   Judgment: Judgment normal.  ? ? ?ED Results / Procedures / Treatments   ?Labs ?(all labs ordered are listed, but only abnormal results are displayed) ?Labs Reviewed - No data to display ? ?EKG ?None ? ?Radiology ?No results found. ? ?Procedures ?Procedures  ? ? ?Medications Ordered in ED ?Medications - No data to display ? ?ED Course/ Medical Decision Making/ A&P ?  ?                        ?Medical Decision Making ?Recurrent abdominal pain with diarrhea, with history of diverticulitis as well as chronic abdominal pain and history of swallowing disorder requiring esophageal dilatation. ? ?Amount and/or Complexity of Data Reviewed ?Independent Historian:  ?   Details: She is a cogent historian ?External Data Reviewed: notes. ?   Details: Recent hospitalization, April 2023 and GI notes from March 2023. ?Labs: ordered. ?   Details: CBC, metabolic panel, lipase- ?Radiology: ordered and independent interpretation performed. ?   Details: CT abdomen pelvis with IV contrast ? ?Risk ?Prescription drug management. ? ? ? ? ? ? ? ? ? ? ?Final Clinical Impression(s) / ED Diagnoses ?Final diagnoses:  ?None  ? ? ?Rx / DC Orders ?ED Discharge Orders   ? ? None  ? ?  ? ? ?  ?Daleen Bo, MD ?12/04/21 1208 ? ?

## 2021-12-04 NOTE — Telephone Encounter (Signed)
?  Pt c/o medication issue: ? ?1. Name of Medication: metoprolol tartrate (LOPRESSOR) 50 MG tablet ? ?2. How are you currently taking this medication (dosage and times per day)? 50 mg twice daily as directed at [place] 12/01/21 ? ?3. Are you having a reaction (difficulty breathing--STAT)?  ? ?4. What is your medication issue? Daughter is concerned about the patient's low HR. The patient did not have any issues with a low HR before the medication was changed  ? ?STAT if HR is under 50 or over 120 ?(normal HR is 60-100 beats per minute) ? ?What is your heart rate? 50 ? ?Do you have a log of your heart rate readings (document readings)? Patient keeps a record on her  ? ?Do you have any other symptoms? No ?

## 2021-12-04 NOTE — Telephone Encounter (Signed)
I spoke with daughter, Raquel Sarna, and they will reduce lopressor back to 25 mg bid and watch BP, HR at home. ?

## 2021-12-04 NOTE — Telephone Encounter (Signed)
I spoke with daughter.Her mother has had a exacerbation of her diverticulitis and is currently in the [facility] ED.She states she is not concerned right now with her heart rate. I will FYI B.Strader,PA-C ?

## 2021-12-04 NOTE — ED Notes (Signed)
EDP at BS 

## 2021-12-04 NOTE — ED Notes (Signed)
Back from CT, prefers to stay in w/c. ?

## 2021-12-04 NOTE — ED Notes (Signed)
C/o LLQ abd pain, "feels like past diverticulitis", also nausea and diarrhea. Denies fever, bleeding, vomiting, back pain, or urinary sx. Verbalizes past colonoscopy was normal. ?

## 2021-12-04 NOTE — ED Triage Notes (Signed)
Pt states she is having abdominal pain with n/v/d that has continued since her hospitalization from 4/19 ?

## 2021-12-05 LAB — PATHOLOGIST SMEAR REVIEW

## 2021-12-11 ENCOUNTER — Telehealth: Payer: Self-pay

## 2021-12-11 NOTE — Telephone Encounter (Signed)
Pt phoned and wants to know when she can have a EGD scheduled. She was just in ED on 5-08 for abd pain and heart related issues. She stated she was suppose to be having one in January and she never was contacted. I advised the pt it was because she was also having heart related issues then. ?I advised the pt that you were at the hospital today and it may be towards the end of the week before we contact her regarding this. The pt was ok with this. ?

## 2021-12-14 ENCOUNTER — Ambulatory Visit: Payer: Medicare Other | Admitting: Oncology

## 2021-12-15 ENCOUNTER — Other Ambulatory Visit: Payer: Self-pay | Admitting: Gastroenterology

## 2021-12-15 NOTE — Telephone Encounter (Signed)
The pt phoned back again today advising she was not contacted regarding the Dr's findings of  her having a EGD. Please advise

## 2021-12-18 NOTE — Telephone Encounter (Signed)
Pt called back and LMOVM regarding her not having heard back from [place] regarding whether she can have a EGD or not? Please advise because I looked at notes and there had been complications regarding this pt.

## 2021-12-19 ENCOUNTER — Other Ambulatory Visit: Payer: Self-pay | Admitting: Cardiology

## 2021-12-19 NOTE — Telephone Encounter (Signed)
Noted will route to front schedulers for an appt in a non urgent spot

## 2021-12-19 NOTE — Telephone Encounter (Signed)
FYI  Returned the pt's call and was advised by her that about 2 weeks ago she was in ED with abd pain. CT was done. Pt states she has been taking her Pantoprazole and Pepcid without relief. I advised her that you had sent in the Lidocaine for her with 2 refills to hold her until she gets in here (which she is very grateful for). I sent a note to the front advising that the pt needed a 2 week visit (with you or any APP). Her appt with Dr Domenic Polite is July 5th. She is very anxious for EGD. I advised her that we have to have cardiac clearance first to which she understood.

## 2021-12-19 NOTE — Telephone Encounter (Signed)
I had wanted her to see cardiology before scheduling an EGD. However, I last saw her in January 2023, and a lot has happened since then. Please arrange non-urgent follow-up with myself or another APP in next 2 weeks to follow-up and arrange EGD.

## 2021-12-27 NOTE — Progress Notes (Unsigned)
GI Office Note    Referring Provider: Ludwig Clarks, FNP Primary Care Physician:  Ludwig Clarks, FNP Primary GI: Dr. Gala Romney  Date:  12/28/2021  ID:  Daisy, Black [birth year], MRN [id number]   Chief Complaint   Chief Complaint  Patient presents with   Dysphagia    Food getting stuck in throat, GERD     History of Present Illness  Daisy Black is a 71 y.o. female presenting today for follow up.   Last colonoscopy and EGD in 2019.  EGD with moderate Schatzki's ring s/p dilation, small hiatal hernia, normal duodenum.  Colonoscopy with internal hemorrhoids, diverticulosis in sigmoid and descending colon, three 4 to 6 mm polyps at the ileocecal valve.  Due for surveillance colonoscopy in 2024.  Last seen in the office 08/10/21. She was having epigastric pain radiating to her back with intermittent nausea. Denied food triggers. Reported solid food dysphagia, tightness in her esophagus. Was on pantoprazole BID. Was due to see oncology and cardiology soon. Previously had CT due to LUQ pain in November 2022 with no evidence of diverticulitis and then LUQ pain resolved. Also c/o low volume hematochezia, requested cream, declined rectal exam. She was advised to begin carafate QID and anusol prn. After seeing cardiology and obtaining clearance she was to undergo EGD/dilation.  She was recently hospitalized in April 2023. She was admitted for diverticulitis and found to be in Afib RVR. Her heart rate was difficult to control with medications and an attempt was made to cardiovert that was initially unsuccessful on 4/24. She was successfully cardioverted on 11/24/21. She was discharged on eliquis and amiodarone. Flecanide was stopped.  She has follow up with cardiology on 12/01/21. Her metoprolol was increased back to her prior dose of 50 mg BID and advised to continue oral amiodarone as well as Eliquis. EP referral recommended.   She was seen in the ED for abdominal pain on 12/04/21. She  presented with abdominal pain, loose BM, decreased appetite for several days. Tried altering diet after 24 hours fasting but did not improve her symptoms. Denied BRBPR. Had no complaints of dysphagia. CT A/P with splenomegaly (known CLL) and no acute findings to explain symptoms.  Mildly elevated alk phos, Hgb normal, thrombocytopenia, lipase normal.  Today:  GERD: reflux is better but still there. Has elevated her bed, etc. Protonix BID. Not using TUMs recently in between. Had taken pepcid daily for a couple of weeks as well with some improvement. Has been on Protonix a couple of years. Has tried Prilosec, Nexium in the past as well. Chest pains at times with belly pain. Denies burning.   Dysphagia: comes and goes. Mostly has problems with meat but does not eat much meat but does feel like it gets stuck mid chest. Does have a hernia she is aware of. Has been eating a lot of yogurt and beans, peanut butter. No protein shakes. Can eat deli chicken and meatloaf.   Noticed that her voice has been different for a little over a year.   Abdominal pain: mostly epigastric, sometimes in LUQ, sore throat, nausea, diarrhea for a week or so. Has been more conscious about her diet. Abdominal pain better as well. Had intermittent LLQ pain then found out it was diverticulitis, doing better since antibiotics.   Reports heart is doing well. No overt chest pains and   Past Medical History:  Diagnosis Date   Anemia    Arthritis    Asthma    CLL (  chronic lymphocytic leukemia) ([place])    Essential hypertension    GERD (gastroesophageal reflux disease)    History of hiatal hernia    Hyperlipidemia    Paroxysmal atrial fibrillation (HCC)    Splenomegaly     Past Surgical History:  Procedure Laterality Date   ANKLE SURGERY Right    repair fracture   CARDIOVERSION N/A 03/20/2016   Procedure: CARDIOVERSION;  Surgeon: Satira Sark, MD;  Location: [facility];  Service: Cardiovascular;  Laterality: N/A;    CARDIOVERSION N/A 05/22/2016   Procedure: CARDIOVERSION;  Surgeon: Satira Sark, MD;  Location: [facility];  Service: Cardiovascular;  Laterality: N/A;   CARDIOVERSION N/A 11/20/2021   Procedure: CARDIOVERSION;  Surgeon: Werner Lean, MD;  Location: [facility];  Service: Cardiovascular;  Laterality: N/A;   CARDIOVERSION N/A 11/24/2021   Procedure: CARDIOVERSION;  Surgeon: Satira Sark, MD;  Location: [facility];  Service: Cardiovascular;  Laterality: N/A;   CHOLECYSTECTOMY     COLONOSCOPY WITH PROPOFOL N/A 03/27/2018   Internal hemorrhoids, diverticulosis in sigmoid and descending colon, three 4-6 mm polyps at IC valve. tubular adenomas 5 year surveillance   ESOPHAGOGASTRODUODENOSCOPY (EGD) WITH PROPOFOL N/A 03/27/2018   Moderate Schatzki's ring s/p dilation, small hiatal hernia   HERNIA REPAIR  2011   Incisional and umbilical utilizing mesh   MALONEY DILATION N/A 03/27/2018   Procedure: Daisy Black DILATION;  Surgeon: Daneil Dolin, MD;  [facility]: [facility];  Service: Endoscopy;  Laterality: N/A;   POLYPECTOMY  03/27/2018   Procedure: POLYPECTOMY;  Surgeon: Daneil Dolin, MD;  [facility];  Service: Endoscopy;;  colon   ROTATOR CUFF REPAIR Left    TEE WITHOUT CARDIOVERSION N/A 01/17/2016   Procedure: TRANSESOPHAGEAL ECHOCARDIOGRAM (TEE);  Surgeon: Herminio Commons, MD;  Location: [facility];  Service: Cardiovascular;  Laterality: N/A;   TEE WITHOUT CARDIOVERSION N/A 02/21/2016   Procedure: TRANSESOPHAGEAL ECHOCARDIOGRAM (TEE) WITH PROPOFOL;  Surgeon: Herminio Commons, MD;  Location: [facility];  Service: Cardiovascular;  Laterality: N/A;   TEE WITHOUT CARDIOVERSION N/A 03/20/2016   Procedure: TRANSESOPHAGEAL ECHOCARDIOGRAM (TEE) WITH PROPOFOL;  Surgeon: Satira Sark, MD;  Location: [facility];  Service: Cardiovascular;  Laterality: N/A;   TONSILLECTOMY     TOTAL KNEE ARTHROPLASTY  2022    Current Outpatient Medications  Medication Sig Dispense Refill    acetaminophen (TYLENOL) 650 MG CR tablet Take 650-1,300 mg by mouth daily as needed for pain.     amiodarone (PACERONE) 200 MG tablet Take 1 tablet (200 mg total) by mouth daily. 90 tablet 2   apixaban (ELIQUIS) 5 MG TABS tablet Take 1 tablet (5 mg total) by mouth 2 (two) times daily. 180 tablet 3   Chlorpheniramine Maleate (CHLOR-TABLETS PO) Take 2 tablets by mouth daily as needed (allergies).     docusate sodium (COLACE) 100 MG capsule Take 100 mg by mouth daily as needed for mild constipation.     fluticasone (FLONASE) 50 MCG/ACT nasal spray Place 1 spray into both nostrils daily as needed for allergies or rhinitis.     furosemide (LASIX) 20 MG tablet Take 2 tablets (40 mg total) by mouth daily as needed for fluid. 180 tablet 2   hydrocortisone (ANUSOL-HC) 2.5 % rectal cream Place 1 application rectally 2 (two) times daily. 30 g 1   lansoprazole (PREVACID) 30 MG capsule Take 1 capsule (30 mg total) by mouth daily at 12 noon. 60 capsule 3   levothyroxine (SYNTHROID, LEVOTHROID) 125 MCG tablet Take 125 mcg by mouth  daily before breakfast.     lidocaine (XYLOCAINE) 2 % solution TAKE 15ML BY MOUTH AS DIRECTED EVERY 6 HOURS AS NEEDED FOR MOUTH PAIN 100 mL 2   metoprolol tartrate (LOPRESSOR) 25 MG tablet Take 1 tablet (25 mg total) by mouth 2 (two) times daily. 180 tablet 3   promethazine (PHENERGAN) 25 MG tablet Take 1 tablet (25 mg total) by mouth every 6 (six) hours as needed for nausea or vomiting. 60 tablet 1   No current facility-administered medications for this visit.   Facility-Administered Medications Ordered in Other Visits  Medication Dose Route Frequency Provider Last Rate Last Admin   hydrocortisone cream 1 % 1 application  1 application. Topical TID PRN Satira Sark, MD        Allergies as of 12/28/2021 - Review Complete 12/28/2021  Allergen Reaction Noted   Flagyl [metronidazole]  05/01/2019    Family History  Problem Relation Age of Onset   Ovarian cancer Mother 74        Secondary to ovarian cancer   Leukemia Mother    Stroke Father 19       Brain stem infarction   Breast cancer Paternal Aunt    Crohn's disease Son    Colon cancer Neg Hx     Social History   Socioeconomic History   Marital status: Married    Spouse name: Not on file   Number of children: 9   Years of education: Not on file   Highest education level: Not on file  Occupational History   Not on file  Tobacco Use   Smoking status: Former    Packs/day: 0.25    Years: 2.00    Pack years: 0.50    Types: Cigarettes    Quit date: 08/03/1975    Years since quitting: 46.4   Smokeless tobacco: Never  Vaping Use   Vaping Use: Never used  Substance and Sexual Activity   Alcohol use: No    Alcohol/week: 0.0 standard drinks   Drug use: No   Sexual activity: Yes    Birth control/protection: Post-menopausal  Other Topics Concern   Not on file  Social History Narrative   Not on file   Social Determinants of Health   Financial Resource Strain: Not on file  Food Insecurity: Not on file  Transportation Needs: Not on file  Physical Activity: Not on file  Stress: Not on file  Social Connections: Not on file     Review of Systems   Gen: Denies fever, chills, anorexia. Denies fatigue, weakness, weight loss.  CV: Denies chest pain, palpitations, syncope, peripheral edema, and claudication. Resp: Denies dyspnea at rest, cough, wheezing, coughing up blood, and pleurisy. GI: see HPI Derm: Denies rash, itching, dry skin Psych: Denies depression, anxiety, memory loss, confusion. No homicidal or suicidal ideation.  Heme: Denies bruising, bleeding, and enlarged lymph nodes.   Physical Exam   BP 130/62   Pulse 66   Temp 97.7 F (36.5 C) (Temporal)   Ht 5' 8"  (1.727 m)   Wt 244 lb (110.7 kg)   BMI 37.10 kg/m   General:   Alert and oriented. No distress noted. Pleasant and cooperative.  Head:  Normocephalic and atraumatic. Eyes:  Conjuctiva clear without scleral  icterus. Mouth:  Oral mucosa pink and moist. Good dentition. No lesions. Lungs:  Clear to auscultation bilaterally. No wheezes, rales, or rhonchi. No distress.  Heart:  S1, S2 present without murmurs appreciated.  Abdomen:  +BS, soft, non-tender and non-distended. No  rebound or guarding. No HSM or masses noted. Rectal: deferred Msk:  Symmetrical without gross deformities. Normal posture. Extremities:  Without edema. Neurologic:  Alert and  oriented x4 Psych:  Alert and cooperative. Normal mood and affect.   Assessment  DESIRAY ORCHARD is a 71 y.o. female with a history of anemia, asthma, HLD, GERD, HTN, Afib on eliquis, and CLL, following with [facility] presenting today with ongoing dysphagia and GERD.  Dysphagia: Continues to have solid food dysphagia.  Seen in January for the same thing, has had some cardiac complications since this timeframe.  Mostly has trouble with meats, only able to consume daily chicken and meat loaf.  She also reports a change in her voice over the last year.  Her last EGD in 2019 she had a Schatzki's ring s/p dilation.  She is intentionally trying to lose weight, doing weight watchers.  Advised her to ensure that she is adequately in taking protein, advised protein shake daily as tolerated.  Given her associated GERD and epigastric pain as well, will proceed with EGD with possible dilation in the near future.  GERD/Epigastric pain: Reports that pain and reflux has been improved with dietary changes, however still there.  Occasionally will have some left upper quadrant involvement.  Denies any burning.  Previously on pantoprazole 40 mg twice daily.  For a few weeks she took Pepcid daily as well with some improvement in symptoms but has not been taking it recently.  States she has been on pantoprazole for years, has tried Prilosec and Nexium in the past.  We will switch to lansoprazole 30 mg daily 30 minutes prior to dinner, advised her that she can take Pepcid  1-2 times daily as needed.  Discussed long-term side effects of PPI use today. GERD lifestyle modifications reinforced.  As stated as above we will proceed with EGD.  Recent Diverticulitis: Recently treated for diverticulitis in April.  Doing well posttreatment.  If pain develops again or has recurrent diverticulitis, may need to consider completing colonoscopy.  Due for screening colonoscopy in 2024.  PLAN   Lansoprazole 30 mg daily, 30 minutes prior to dinner Pepcid as needed once to twice daily.  Call with progress report in 2-3 weeks.  GERD lifestyle modifications reinforced. Proceed with upper endoscopy +/- dilation with propofol by Dr. Gala Romney in near future: the risks, benefits, and alternatives have been discussed with the patient in detail. The patient states understanding and desires to proceed. ASA 3 Hold Eliquis 48 hours prior to procedure Cardiac clearance needed for procedure   Venetia Night, MSN, FNP-BC, AGACNP-BC [facility]

## 2021-12-28 ENCOUNTER — Telehealth: Payer: Self-pay | Admitting: *Deleted

## 2021-12-28 ENCOUNTER — Ambulatory Visit (INDEPENDENT_AMBULATORY_CARE_PROVIDER_SITE_OTHER): Payer: Medicare Other | Admitting: Gastroenterology

## 2021-12-28 ENCOUNTER — Encounter: Payer: Self-pay | Admitting: Gastroenterology

## 2021-12-28 VITALS — BP 130/62 | HR 66 | Temp 97.7°F | Ht 68.0 in | Wt 244.0 lb

## 2021-12-28 DIAGNOSIS — K219 Gastro-esophageal reflux disease without esophagitis: Secondary | ICD-10-CM

## 2021-12-28 DIAGNOSIS — R1310 Dysphagia, unspecified: Secondary | ICD-10-CM

## 2021-12-28 DIAGNOSIS — R1013 Epigastric pain: Secondary | ICD-10-CM

## 2021-12-28 DIAGNOSIS — Z8719 Personal history of other diseases of the digestive system: Secondary | ICD-10-CM | POA: Diagnosis not present

## 2021-12-28 MED ORDER — LANSOPRAZOLE 30 MG PO CPDR: 30.0000 mg | DELAYED_RELEASE_CAPSULE | Freq: Every day | ORAL | 3 refills | Status: DC

## 2021-12-28 NOTE — Telephone Encounter (Signed)
Attention: Preop   We would like to request holding the following medication for patient please.  Procedure: Upper Endoscopy with possible dilation   Date: TBD  Medication to hold: Eliquis for 48 hours prior to procedure   Surgeon: Dr. Gala Romney   Phone: [phone]  Fax:  [phone]  Type of Anesthesia:  Propofol ASA III

## 2021-12-28 NOTE — Telephone Encounter (Signed)
Clinical pharmacist to review Eliquis 

## 2021-12-28 NOTE — Patient Instructions (Addendum)
I want you to stop taking pantoprazole and begin taking lansoprazole 30 mg daily, 30 minutes prior to dinner.  I want you to take Pepcid as needed during the day.  If after 2 to 3 weeks you feel your symptoms are worsened despite adherence to your diet, you may increase your lansoprazole to twice daily and continue to use Pepcid in between for breakthrough symptoms. Please call the office to let me know how you are doing.   We will reach out to cardiology for clearance to proceed with your upper endoscopy and to hold your Eliquis for 48 hours prior to procedure.  It was a pleasure to see you today! I want to create trusting relationships with patients. If you receive a survey regarding your visit,  I greatly appreciate you taking time to fill this out on paper or through your MyChart. I value your feedback.  Venetia Night, MSN, FNP-BC, AGACNP-BC [facility]

## 2021-12-28 NOTE — Telephone Encounter (Signed)
Sent medication clearance. Waiting for approval for procedure.  

## 2021-12-29 NOTE — Telephone Encounter (Signed)
Patient with diagnosis of afib on Eliquis for anticoagulation.    Procedure: upper endoscopy Date of procedure: TBD  CHA2DS2-VASc Score = 4  This indicates a 4.8% annual risk of stroke. The patient's score is based upon: CHF History: 1 HTN History: 1 Diabetes History: 0 Stroke History: 0 Vascular Disease History: 0 Age Score: 1 Gender Score: 1  Pt underwent cardioversion on 11/20/21 and 11/24/21.  CrCl 67m/min using adjusted body weight due to obesity Platelet count 149K  She is now just outside her 30 day post DCCV period. Per office protocol, patient can hold Eliquis for 2 days prior to procedure as requested.

## 2021-12-29 NOTE — Telephone Encounter (Signed)
    Patient Name: Daisy Black  DOB: November 23, 1950 MRN: [id number]  Primary Cardiologist: Rozann Lesches, MD  Chart reviewed as part of pre-operative protocol coverage.  Per pharmacy she is now just outside her 30 day post DCCV period. Per office protocol, patient can hold Eliquis for 2 days prior to procedure as requested.     I will route this recommendation to the requesting party via Epic fax function and remove from pre-op pool.  Please call with questions.  Mable Fill, Marissa Nestle, NP 12/29/2021, 3:41 PM

## 2022-01-01 NOTE — Telephone Encounter (Signed)
Will call pt to schedule once we receive schedule

## 2022-01-01 NOTE — Telephone Encounter (Signed)
Received medication clearance. Copy placed on providers desk.  

## 2022-01-02 NOTE — Telephone Encounter (Signed)
Noted  

## 2022-01-08 NOTE — Telephone Encounter (Signed)
Patient called back and stated after looking at the calendar she will be out of town that whole week. Aware will call with august schedule.

## 2022-01-08 NOTE — Telephone Encounter (Signed)
Spoke with pt. She has been scheduled for 7/14 at 1:15pm. Aware will mail instructions/pre-op appt.

## 2022-01-25 ENCOUNTER — Encounter: Payer: Self-pay | Admitting: *Deleted

## 2022-01-25 NOTE — Telephone Encounter (Signed)
Spoke with pt. Scheduled for 8/2. Will mail new instructions/pre-op appt.

## 2022-01-31 ENCOUNTER — Ambulatory Visit (INDEPENDENT_AMBULATORY_CARE_PROVIDER_SITE_OTHER): Payer: Medicare Other | Admitting: Cardiology

## 2022-01-31 ENCOUNTER — Encounter: Payer: Self-pay | Admitting: Cardiology

## 2022-01-31 VITALS — BP 114/68 | HR 77 | Ht 68.0 in | Wt 242.8 lb

## 2022-01-31 DIAGNOSIS — I480 Paroxysmal atrial fibrillation: Secondary | ICD-10-CM

## 2022-01-31 DIAGNOSIS — E039 Hypothyroidism, unspecified: Secondary | ICD-10-CM | POA: Diagnosis not present

## 2022-01-31 NOTE — Progress Notes (Signed)
Cardiology Office Note  Date: 01/31/2022   ID: Daisy Black, Daisy Black 12/16/1950, MRN [id number]  PCP:  Ludwig Clarks, FNP  Cardiologist:  Rozann Lesches, MD Electrophysiologist:  None   Chief Complaint  Patient presents with   Cardiac follow-up    History of Present Illness: Daisy Black is a 71 y.o. female last seen in May by Ms. Strader PA-C, I reviewed the note.  She is here for a follow-up visit, reports no significant palpitations on current regimen including low-dose amiodarone, Lopressor, and Eliquis.  She does not report any spontaneous bleeding problems.  She has not had any obvious sustained atrial fibrillation since successful cardioversion on amiodarone in April.  Lab work shows normal TSH and LFTs, chest x-ray in April showed no acute findings.  She also gets annual eye exams at baseline.  Past Medical History:  Diagnosis Date   Anemia    Arthritis    Asthma    CLL (chronic lymphocytic leukemia) ([place])    Essential hypertension    GERD (gastroesophageal reflux disease)    History of hiatal hernia    Hyperlipidemia    Paroxysmal atrial fibrillation (HCC)    Splenomegaly     Past Surgical History:  Procedure Laterality Date   ANKLE SURGERY Right    repair fracture   CARDIOVERSION N/A 03/20/2016   Procedure: CARDIOVERSION;  Surgeon: Satira Sark, MD;  Location: [facility];  Service: Cardiovascular;  Laterality: N/A;   CARDIOVERSION N/A 05/22/2016   Procedure: CARDIOVERSION;  Surgeon: Satira Sark, MD;  Location: [facility];  Service: Cardiovascular;  Laterality: N/A;   CARDIOVERSION N/A 11/20/2021   Procedure: CARDIOVERSION;  Surgeon: Werner Lean, MD;  Location: [facility];  Service: Cardiovascular;  Laterality: N/A;   CARDIOVERSION N/A 11/24/2021   Procedure: CARDIOVERSION;  Surgeon: Satira Sark, MD;  Location: [facility];  Service: Cardiovascular;  Laterality: N/A;   CHOLECYSTECTOMY     COLONOSCOPY WITH PROPOFOL N/A 03/27/2018   Internal  hemorrhoids, diverticulosis in sigmoid and descending colon, three 4-6 mm polyps at IC valve. tubular adenomas 5 year surveillance   ESOPHAGOGASTRODUODENOSCOPY (EGD) WITH PROPOFOL N/A 03/27/2018   Moderate Schatzki's ring s/p dilation, small hiatal hernia   HERNIA REPAIR  2011   Incisional and umbilical utilizing mesh   MALONEY DILATION N/A 03/27/2018   Procedure: Venia Minks DILATION;  Surgeon: Daneil Dolin, MD;  [facility];  Service: Endoscopy;  Laterality: N/A;   POLYPECTOMY  03/27/2018   Procedure: POLYPECTOMY;  Surgeon: Daneil Dolin, MD;  [facility];  Service: Endoscopy;;  colon   ROTATOR CUFF REPAIR Left    TEE WITHOUT CARDIOVERSION N/A 01/17/2016   Procedure: TRANSESOPHAGEAL ECHOCARDIOGRAM (TEE);  Surgeon: Herminio Commons, MD;  Location: [facility];  Service: Cardiovascular;  Laterality: N/A;   TEE WITHOUT CARDIOVERSION N/A 02/21/2016   Procedure: TRANSESOPHAGEAL ECHOCARDIOGRAM (TEE) WITH PROPOFOL;  Surgeon: Herminio Commons, MD;  Location: [facility];  Service: Cardiovascular;  Laterality: N/A;   TEE WITHOUT CARDIOVERSION N/A 03/20/2016   Procedure: TRANSESOPHAGEAL ECHOCARDIOGRAM (TEE) WITH PROPOFOL;  Surgeon: Satira Sark, MD;  Location: [facility];  Service: Cardiovascular;  Laterality: N/A;   TONSILLECTOMY     TOTAL KNEE ARTHROPLASTY  2022    Current Outpatient Medications  Medication Sig Dispense Refill   acetaminophen (TYLENOL) 650 MG CR tablet Take 650-1,300 mg by mouth daily as needed for pain.     amiodarone (PACERONE) 200 MG tablet Take 1 tablet (200 mg total) by  mouth daily. 90 tablet 2   apixaban (ELIQUIS) 5 MG TABS tablet Take 1 tablet (5 mg total) by mouth 2 (two) times daily. 180 tablet 3   Chlorpheniramine Maleate (CHLOR-TABLETS PO) Take 2 tablets by mouth daily as needed (allergies).     docusate sodium (COLACE) 100 MG capsule Take 100 mg by mouth daily as needed for mild constipation.     fluticasone (FLONASE) 50 MCG/ACT nasal spray  Place 1 spray into both nostrils daily as needed for allergies or rhinitis.     furosemide (LASIX) 20 MG tablet Take 2 tablets (40 mg total) by mouth daily as needed for fluid. 180 tablet 2   hydrocortisone (ANUSOL-HC) 2.5 % rectal cream Place 1 application rectally 2 (two) times daily. 30 g 1   lansoprazole (PREVACID) 30 MG capsule Take 1 capsule (30 mg total) by mouth daily at 12 noon. 60 capsule 3   levothyroxine (SYNTHROID, LEVOTHROID) 125 MCG tablet Take 125 mcg by mouth daily before breakfast.     lidocaine (XYLOCAINE) 2 % solution TAKE 15ML BY MOUTH AS DIRECTED EVERY 6 HOURS AS NEEDED FOR MOUTH PAIN 100 mL 2   metoprolol tartrate (LOPRESSOR) 25 MG tablet Take 1 tablet (25 mg total) by mouth 2 (two) times daily. 180 tablet 3   promethazine (PHENERGAN) 25 MG tablet Take 1 tablet (25 mg total) by mouth every 6 (six) hours as needed for nausea or vomiting. 60 tablet 1   No current facility-administered medications for this visit.   Facility-Administered Medications Ordered in Other Visits  Medication Dose Route Frequency Provider Last Rate Last Admin   hydrocortisone cream 1 % 1 application  1 application  Topical TID PRN Satira Sark, MD       Allergies:  Flagyl [metronidazole]   ROS: No orthopnea or PND.  Chronic back pain.  Physical Exam: VS:  BP 114/68   Pulse 77   Ht '5\' 8"'$  (1.727 m)   Wt 242 lb 12.8 oz (110.1 kg)   SpO2 96%   BMI 36.92 kg/m , BMI Body mass index is 36.92 kg/m.  Wt Readings from Last 3 Encounters:  01/31/22 242 lb 12.8 oz (110.1 kg)  12/28/21 244 lb (110.7 kg)  12/04/21 245 lb (111.1 kg)    General: Patient appears comfortable at rest. HEENT: Conjunctiva and lids normal, oropharynx clear. Neck: Supple, no elevated JVP or carotid bruits, no thyromegaly. Lungs: Clear to auscultation, nonlabored breathing at rest. Cardiac: Regular rate and rhythm, no S3 or significant systolic murmur, no pericardial rub. Extremities: No pitting edema.  ECG:  An ECG  dated 12/01/2021 was personally reviewed today and demonstrated:  Sinus rhythm.  Recent Labwork: 11/17/2021: TSH 1.987 11/18/2021: Magnesium 2.2 12/04/2021: ALT 17; AST 29; BUN 16; Creatinine, Ser 0.99; Hemoglobin 14.0; Platelets 149; Potassium 3.4; Sodium 141     Component Value Date/Time   CHOL 219 (H) 11/30/2009 1950   TRIG 69 11/30/2009 1950   HDL 56 11/30/2009 1950   CHOLHDL 3.9 Ratio 11/30/2009 1950   VLDL 14 11/30/2009 1950   LDLCALC 149 (H) 11/30/2009 1950    Other Studies Reviewed Today:  Echocardiogram 06/29/2020:  1. Left ventricular ejection fraction, by estimation, is 60 to 65%. The  left ventricle has normal function. The left ventricle has no regional  wall motion abnormalities. There is mild left ventricular hypertrophy.  Left ventricular diastolic parameters  are consistent with Grade I diastolic dysfunction (impaired relaxation).   2. Right ventricular systolic function is normal. The right ventricular  size is normal.   3. The mitral valve is normal in structure. No evidence of mitral valve  regurgitation. No evidence of mitral stenosis.   4. The aortic valve is tricuspid. Aortic valve regurgitation is not  visualized. No aortic stenosis is present.   5. The inferior vena cava is normal in size with greater than 50%  respiratory variability, suggesting right atrial pressure of 3 mmHg.   Assessment and Plan:  1.  Paroxysmal to persistent atrial fibrillation with CHA2DS2-VASc score of 3.  She was taken off of flecainide and replaced with amiodarone, subsequently underwent successful DCCV in April and has done well without significant palpitations since that time.  Continue Lopressor and Eliquis.  Plan to see her back in 6 months, check TSH and LFTs at that time.  We have discussed EP referral if symptoms or not adequately controlled for discussions regarding either Tikosyn or possibly ablation.  2.  Hypothyroidism, currently on Synthroid and with normal TSH in April.   Continue to follow on amiodarone.  Medication Adjustments/Labs and Tests Ordered: Current medicines are reviewed at length with the patient today.  Concerns regarding medicines are outlined above.   Tests Ordered: Orders Placed This Encounter  Procedures   Hepatic function panel   TSH    Medication Changes: No orders of the defined types were placed in this encounter.   Disposition:  Follow up  6 months.  Signed, Satira Sark, MD, [facility] 01/31/2022 3:36 PM    [facility] [place], [place], [place] [address] Phone: [phone]; Fax: [phone]

## 2022-01-31 NOTE — Patient Instructions (Signed)
Medication Instructions:  Your physician recommends that you continue on your current medications as directed. Please refer to the Current Medication list given to you today.   Labwork: TSH, LFT's ([facility] in [place])  Testing/Procedures: None  Follow-Up:  Your physician recommends that you schedule a follow-up appointment in: 6 months  Any Other Special Instructions Will Be Listed Below (If Applicable).  If you need a refill on your cardiac medications before your next appointment, please call your pharmacy.

## 2022-02-01 ENCOUNTER — Encounter: Payer: Self-pay | Admitting: Internal Medicine

## 2022-02-09 ENCOUNTER — Encounter (HOSPITAL_COMMUNITY): Payer: Self-pay

## 2022-02-09 ENCOUNTER — Ambulatory Visit (HOSPITAL_COMMUNITY): Admit: 2022-02-09 | Payer: Medicare Other | Admitting: Internal Medicine

## 2022-02-09 SURGERY — ESOPHAGOGASTRODUODENOSCOPY (EGD) WITH PROPOFOL
Anesthesia: Monitor Anesthesia Care

## 2022-02-22 ENCOUNTER — Telehealth: Payer: Self-pay | Admitting: Oncology

## 2022-02-22 NOTE — Patient Instructions (Signed)
Daisy Black  02/22/2022     '@PREFPERIOPPHARMACY'$ @   Your procedure is scheduled on  02/28/2022.   Report to [facility] at  [phone]  A.M.   Call this number if you have problems the morning of surgery:  [phone]   Remember:  Follow the diet instructions given to you by the office.     Your last dose of eliquis should be on 02/25/2022.     Take these medicines the morning of surgery with A SIP OF WATER        pacerone, zyrtec, prevacid, synthroid, metoprolol.     Do not wear jewelry, make-up or nail polish.  Do not wear lotions, powders, or perfumes, or deodorant.  Do not shave 48 hours prior to surgery.  Men may shave face and neck.  Do not bring valuables to the hospital.  [facility] is not responsible for any belongings or valuables.  Contacts, dentures or bridgework may not be worn into surgery.  Leave your suitcase in the car.  After surgery it may be brought to your room.  For patients admitted to the hospital, discharge time will be determined by your treatment team.  Patients discharged the day of surgery will not be allowed to drive home and must have someone with them for 24 hours.    Special instructions:   DO NOT smoke tobacco or vape for 24 hours before your procedure.  Please read over the following fact sheets that you were given. Anesthesia Post-op Instructions and Care and Recovery After Surgery      Upper Endoscopy, Adult, Care After This sheet gives you information about how to care for yourself after your procedure. Your health care provider may also give you more specific instructions. If you have problems or questions, contact your health care provider. What can I expect after the procedure? After the procedure, it is common to have: A sore throat. Mild stomach pain or discomfort. Bloating. Nausea. Follow these instructions at home:  Follow instructions from your health care provider about what to eat or drink after your  procedure. Return to your normal activities as told by your health care provider. Ask your health care provider what activities are safe for you. Take over-the-counter and prescription medicines only as told by your health care provider. If you were given a sedative during the procedure, it can affect you for several hours. Do not drive or operate machinery until your health care provider says that it is safe. Keep all follow-up visits as told by your health care provider. This is important. Contact a health care provider if you have: A sore throat that lasts longer than one day. Trouble swallowing. Get help right away if: You vomit blood or your vomit looks like coffee grounds. You have: A fever. Bloody, black, or tarry stools. A severe sore throat or you cannot swallow. Difficulty breathing. Severe pain in your chest or abdomen. Summary After the procedure, it is common to have a sore throat, mild stomach discomfort, bloating, and nausea. If you were given a sedative during the procedure, it can affect you for several hours. Do not drive or operate machinery until your health care provider says that it is safe. Follow instructions from your health care provider about what to eat or drink after your procedure. Return to your normal activities as told by your health care provider. This information is not intended to replace advice given to you by your health care  provider. Make sure you discuss any questions you have with your health care provider. Document Revised: 05/22/2019 Document Reviewed: 12/16/2017 Elsevier Patient Education  [place]. Esophageal Dilatation Esophageal dilatation, also called esophageal dilation, is a procedure to widen or open a blocked or narrowed part of the esophagus. The esophagus is the part of the body that moves food and liquid from the mouth to the stomach. You may need this procedure if: You have a buildup of scar tissue in your esophagus that  makes it difficult, painful, or impossible to swallow. This can be caused by gastroesophageal reflux disease (GERD). You have cancer of the esophagus. There is a problem with how food moves through your esophagus. In some cases, you may need this procedure repeated at a later time to dilate the esophagus gradually. Tell a health care provider about: Any allergies you have. All medicines you are taking, including vitamins, herbs, eye drops, creams, and over-the-counter medicines. Any problems you or family members have had with anesthetic medicines. Any blood disorders you have. Any surgeries you have had. Any medical conditions you have. Any antibiotic medicines you are required to take before dental procedures. Whether you are pregnant or may be pregnant. What are the risks? Generally, this is a safe procedure. However, problems may occur, including: Bleeding due to a tear in the lining of the esophagus. A hole, or perforation, in the esophagus. What happens before the procedure? Ask your health care provider about: Changing or stopping your regular medicines. This is especially important if you are taking diabetes medicines or blood thinners. Taking medicines such as aspirin and ibuprofen. These medicines can thin your blood. Do not take these medicines unless your health care provider tells you to take them. Taking over-the-counter medicines, vitamins, herbs, and supplements. Follow instructions from your health care provider about eating or drinking restrictions. Plan to have a responsible adult take you home from the hospital or clinic. Plan to have a responsible adult care for you for the time you are told after you leave the hospital or clinic. This is important. What happens during the procedure? You may be given a medicine to help you relax (sedative). A numbing medicine may be sprayed into the back of your throat, or you may gargle the medicine. Your health care provider may  perform the dilatation using various surgical instruments, such as: Simple dilators. This instrument is carefully placed in the esophagus to stretch it. Guided wire bougies. This involves using an endoscope to insert a wire into the esophagus. A dilator is passed over this wire to enlarge the esophagus. Then the wire is removed. Balloon dilators. An endoscope with a small balloon is inserted into the esophagus. The balloon is inflated to stretch the esophagus and open it up. The procedure may vary among health care providers and hospitals. What can I expect after the procedure? Your blood pressure, heart rate, breathing rate, and blood oxygen level will be monitored until you leave the hospital or clinic. Your throat may feel slightly sore and numb. This will get better over time. You will not be allowed to eat or drink until your throat is no longer numb. When you are able to drink, urinate, and sit on the edge of the bed without nausea or dizziness, you may be able to return home. Follow these instructions at home: Take over-the-counter and prescription medicines only as told by your health care provider. If you were given a sedative during the procedure, it can affect you  for several hours. Do not drive or operate machinery until your health care provider says that it is safe. Plan to have a responsible adult care for you for the time you are told. This is important. Follow instructions from your health care provider about any eating or drinking restrictions. Do not use any products that contain nicotine or tobacco, such as cigarettes, e-cigarettes, and chewing tobacco. If you need help quitting, ask your health care provider. Keep all follow-up visits. This is important. Contact a health care provider if: You have a fever. You have pain that is not relieved by medicine. Get help right away if: You have chest pain. You have trouble breathing. You have trouble swallowing. You vomit  blood. You have black, tarry, or bloody stools. These symptoms may represent a serious problem that is an emergency. Do not wait to see if the symptoms will go away. Get medical help right away. Call your local emergency services (911 in the [place] Do not drive yourself to the hospital. Summary Esophageal dilatation, also called esophageal dilation, is a procedure to widen or open a blocked or narrowed part of the esophagus. Plan to have a responsible adult take you home from the hospital or clinic. For this procedure, a numbing medicine may be sprayed into the back of your throat, or you may gargle the medicine. Do not drive or operate machinery until your health care provider says that it is safe. This information is not intended to replace advice given to you by your health care provider. Make sure you discuss any questions you have with your health care provider. Document Revised: 12/02/2019 Document Reviewed: 12/02/2019 Elsevier Patient Education  [place] After This sheet gives you information about how to care for yourself after your procedure. Your health care provider may also give you more specific instructions. If you have problems or questions, contact your health care provider. What can I expect after the procedure? After the procedure, it is common to have: Tiredness. Forgetfulness about what happened after the procedure. Impaired judgment for important decisions. Nausea or vomiting. Some difficulty with balance. Follow these instructions at home: For the time period you were told by your health care provider:     Rest as needed. Do not participate in activities where you could fall or become injured. Do not drive or use machinery. Do not drink alcohol. Do not take sleeping pills or medicines that cause drowsiness. Do not make important decisions or sign legal documents. Do not take care of children on your own. Eating and  drinking Follow the diet that is recommended by your health care provider. Drink enough fluid to keep your urine pale yellow. If you vomit: Drink water, juice, or soup when you can drink without vomiting. Make sure you have little or no nausea before eating solid foods. General instructions Have a responsible adult stay with you for the time you are told. It is important to have someone help care for you until you are awake and alert. Take over-the-counter and prescription medicines only as told by your health care provider. If you have sleep apnea, surgery and certain medicines can increase your risk for breathing problems. Follow instructions from your health care provider about wearing your sleep device: Anytime you are sleeping, including during daytime naps. While taking prescription pain medicines, sleeping medicines, or medicines that make you drowsy. Avoid smoking. Keep all follow-up visits as told by your health care provider. This is important. Contact a  health care provider if: You keep feeling nauseous or you keep vomiting. You feel light-headed. You are still sleepy or having trouble with balance after 24 hours. You develop a rash. You have a fever. You have redness or swelling around the IV site. Get help right away if: You have trouble breathing. You have new-onset confusion at home. Summary For several hours after your procedure, you may feel tired. You may also be forgetful and have poor judgment. Have a responsible adult stay with you for the time you are told. It is important to have someone help care for you until you are awake and alert. Rest as told. Do not drive or operate machinery. Do not drink alcohol or take sleeping pills. Get help right away if you have trouble breathing, or if you suddenly become confused. This information is not intended to replace advice given to you by your health care provider. Make sure you discuss any questions you have with your  health care provider. Document Revised: 06/20/2021 Document Reviewed: 06/18/2019 Elsevier Patient Education  [place].

## 2022-02-22 NOTE — Telephone Encounter (Signed)
Patient called and stated she recently had some scans done and wants to change her appointment with Dr. Grayland Ormond until late October. She also wants him to look at her recent scan. She would like to not have the Ctn that is scheduled for August and see if she can get that rescheduled to October?  Please advise

## 2022-02-26 ENCOUNTER — Encounter (HOSPITAL_COMMUNITY)
Admission: RE | Admit: 2022-02-26 | Discharge: 2022-02-26 | Disposition: A | Discharge status: Home / Self Care | Payer: Medicare Other | Source: Ambulatory Visit | Attending: Internal Medicine | Admitting: Internal Medicine

## 2022-02-26 ENCOUNTER — Encounter (HOSPITAL_COMMUNITY): Payer: Self-pay

## 2022-02-26 VITALS — BP 134/68 | HR 69 | Temp 98.5°F | Resp 18 | Ht 68.0 in | Wt 242.7 lb

## 2022-02-26 DIAGNOSIS — Z79899 Other long term (current) drug therapy: Secondary | ICD-10-CM | POA: Insufficient documentation

## 2022-02-26 DIAGNOSIS — Z01812 Encounter for preprocedural laboratory examination: Secondary | ICD-10-CM | POA: Insufficient documentation

## 2022-02-26 LAB — BASIC METABOLIC PANEL
Anion gap: 6 (ref 5–15)
BUN: 15 mg/dL (ref 8–23)
CO2: 25 mmol/L (ref 22–32)
Calcium: 9.5 mg/dL (ref 8.9–10.3)
Chloride: 112 mmol/L — ABNORMAL HIGH (ref 98–111)
Creatinine, Ser: 0.87 mg/dL (ref 0.44–1.00)
GFR, Estimated: >60 mL/min (ref >60)
Glucose, Bld: 90 mg/dL (ref 70–99)
Potassium: 3.7 mmol/L (ref 3.5–5.1)
Sodium: 143 mmol/L (ref 135–145)

## 2022-02-28 ENCOUNTER — Ambulatory Visit (HOSPITAL_COMMUNITY)
Admission: RE | Admit: 2022-02-28 | Discharge: 2022-02-28 | Disposition: A | Discharge status: Home / Self Care | Payer: Medicare Other | Attending: Internal Medicine | Admitting: Internal Medicine

## 2022-02-28 ENCOUNTER — Encounter (HOSPITAL_COMMUNITY): Payer: Self-pay | Admitting: Internal Medicine

## 2022-02-28 ENCOUNTER — Encounter (HOSPITAL_COMMUNITY)
Admission: RE | Disposition: A | Discharge status: Home / Self Care | Payer: Self-pay | Source: Home / Self Care | Attending: Internal Medicine

## 2022-02-28 ENCOUNTER — Ambulatory Visit (HOSPITAL_BASED_OUTPATIENT_CLINIC_OR_DEPARTMENT_OTHER): Payer: Medicare Other | Admitting: Certified Registered Nurse Anesthetist

## 2022-02-28 ENCOUNTER — Telehealth: Payer: Self-pay

## 2022-02-28 ENCOUNTER — Ambulatory Visit (HOSPITAL_COMMUNITY): Payer: Medicare Other | Admitting: Certified Registered Nurse Anesthetist

## 2022-02-28 ENCOUNTER — Other Ambulatory Visit: Payer: Self-pay

## 2022-02-28 DIAGNOSIS — Z87891 Personal history of nicotine dependence: Secondary | ICD-10-CM | POA: Diagnosis not present

## 2022-02-28 DIAGNOSIS — I509 Heart failure, unspecified: Secondary | ICD-10-CM | POA: Diagnosis not present

## 2022-02-28 DIAGNOSIS — E039 Hypothyroidism, unspecified: Secondary | ICD-10-CM

## 2022-02-28 DIAGNOSIS — I5031 Acute diastolic (congestive) heart failure: Secondary | ICD-10-CM

## 2022-02-28 DIAGNOSIS — Z79899 Other long term (current) drug therapy: Secondary | ICD-10-CM | POA: Diagnosis not present

## 2022-02-28 DIAGNOSIS — I110 Hypertensive heart disease with heart failure: Secondary | ICD-10-CM | POA: Diagnosis not present

## 2022-02-28 DIAGNOSIS — K219 Gastro-esophageal reflux disease without esophagitis: Secondary | ICD-10-CM | POA: Diagnosis not present

## 2022-02-28 DIAGNOSIS — K222 Esophageal obstruction: Secondary | ICD-10-CM | POA: Diagnosis not present

## 2022-02-28 DIAGNOSIS — R1310 Dysphagia, unspecified: Secondary | ICD-10-CM | POA: Diagnosis not present

## 2022-02-28 DIAGNOSIS — K259 Gastric ulcer, unspecified as acute or chronic, without hemorrhage or perforation: Secondary | ICD-10-CM | POA: Insufficient documentation

## 2022-02-28 HISTORY — PX: ESOPHAGOGASTRODUODENOSCOPY (EGD) WITH PROPOFOL: SHX5813

## 2022-02-28 HISTORY — PX: MALONEY DILATION: SHX5535

## 2022-02-28 HISTORY — PX: BIOPSY: SHX5522

## 2022-02-28 SURGERY — ESOPHAGOGASTRODUODENOSCOPY (EGD) WITH PROPOFOL
Anesthesia: General

## 2022-02-28 MED ORDER — PROPOFOL 500 MG/50ML IV EMUL
INTRAVENOUS | Status: DC | PRN
  Administered 2022-02-28: 150 ug/kg/min via INTRAVENOUS

## 2022-02-28 MED ORDER — PHENYLEPHRINE HCL (PRESSORS) 10 MG/ML IV SOLN
INTRAVENOUS | Status: DC | PRN
  Administered 2022-02-28: 25 ug via INTRAVENOUS

## 2022-02-28 MED ORDER — RABEPRAZOLE SODIUM 20 MG PO TBEC: 20.0000 mg | DELAYED_RELEASE_TABLET | Freq: Two times a day (BID) | ORAL | 11 refills | Status: DC

## 2022-02-28 MED ORDER — LACTATED RINGERS IV SOLN: INTRAVENOUS | Status: DC | PRN

## 2022-02-28 MED ORDER — PROPOFOL 500 MG/50ML IV EMUL
INTRAVENOUS | Status: AC
  Filled 2022-02-28: qty 50

## 2022-02-28 MED ORDER — LIDOCAINE HCL (CARDIAC) PF 100 MG/5ML IV SOSY
PREFILLED_SYRINGE | INTRAVENOUS | Status: DC | PRN
  Administered 2022-02-28: 25 mg via INTRAVENOUS

## 2022-02-28 MED ORDER — PROPOFOL 10 MG/ML IV BOLUS
INTRAVENOUS | Status: DC | PRN
  Administered 2022-02-28: 60 mg via INTRAVENOUS

## 2022-02-28 NOTE — Discharge Instructions (Signed)
EGD Discharge instructions Please read the instructions outlined below and refer to this sheet in the next few weeks. These discharge instructions provide you with general information on caring for yourself after you leave the hospital. Your doctor may also give you specific instructions. While your treatment has been planned according to the most current medical practices available, unavoidable complications occasionally occur. If you have any problems or questions after discharge, please call your doctor. ACTIVITY You may resume your regular activity but move at a slower pace for the next 24 hours.  Take frequent rest periods for the next 24 hours.  Walking will help expel (get rid of) the air and reduce the bloated feeling in your abdomen.  No driving for 24 hours (because of the anesthesia (medicine) used during the test).  You may shower.  Do not sign any important legal documents or operate any machinery for 24 hours (because of the anesthesia used during the test).  NUTRITION Drink plenty of fluids.  You may resume your normal diet.  Begin with a light meal and progress to your normal diet.  Avoid alcoholic beverages for 24 hours or as instructed by your caregiver.  MEDICATIONS You may resume your normal medications unless your caregiver tells you otherwise.  WHAT YOU CAN EXPECT TODAY You may experience abdominal discomfort such as a feeling of fullness or "gas" pains.  FOLLOW-UP Your doctor will discuss the results of your test with you.  SEEK IMMEDIATE MEDICAL ATTENTION IF ANY OF THE FOLLOWING OCCUR: Excessive nausea (feeling sick to your stomach) and/or vomiting.  Severe abdominal pain and distention (swelling).  Trouble swallowing.  Temperature over 101 F (37.8 C).  Rectal bleeding or vomiting of blood.      Your esophagus was stretched today  I was able to stretch it a little bigger than last time)  Stomach was a bit inflamed.  Samples taken.  Stop  lansoprazole  Begin rabeprazole or Aciphex 20 mg pill -take 1 before breakfast and supper every day.  My office is calling a new prescription into your pharmacy  Further recommendations to follow pending review of pathology report  At patient request, I called Jana Half a [phone] findings  Resume Eliquis today.

## 2022-02-28 NOTE — Anesthesia Preprocedure Evaluation (Signed)
Anesthesia Evaluation  Patient identified by MRN, date of birth, ID band Patient awake    Reviewed: Allergy & Precautions, H&P , NPO status , Patient's Chart, lab work & pertinent test results, reviewed documented beta blocker date and time   Airway Mallampati: II  TM Distance: >3 FB Neck ROM: full    Dental no notable dental hx.    Pulmonary asthma , former smoker,    Pulmonary exam normal breath sounds clear to auscultation       Cardiovascular Exercise Tolerance: Good hypertension, +CHF  + dysrhythmias Atrial Fibrillation  Rhythm:regular Rate:Normal     Neuro/Psych  Neuromuscular disease negative psych ROS   GI/Hepatic Neg liver ROS, hiatal hernia, GERD  Medicated,  Endo/Other  Hypothyroidism   Renal/GU negative Renal ROS  negative genitourinary   Musculoskeletal   Abdominal   Peds  Hematology  (+) Blood dyscrasia, anemia ,   Anesthesia Other Findings   Reproductive/Obstetrics negative OB ROS                             Anesthesia Physical Anesthesia Plan  ASA: 3  Anesthesia Plan: General   Post-op Pain Management:    Induction:   PONV Risk Score and Plan: Propofol infusion  Airway Management Planned:   Additional Equipment:   Intra-op Plan:   Post-operative Plan:   Informed Consent: I have reviewed the patients History and Physical, chart, labs and discussed the procedure including the risks, benefits and alternatives for the proposed anesthesia with the patient or authorized representative who has indicated his/her understanding and acceptance.     Dental Advisory Given  Plan Discussed with: CRNA  Anesthesia Plan Comments:         Anesthesia Quick Evaluation

## 2022-02-28 NOTE — Telephone Encounter (Signed)
-----   Message from Daneil Dolin, MD sent at 02/28/2022 10:04 AM EDT ----- I am stopping her lansoprazole.  Need a new prescription for rabeprazole or Aciphex 20 mg pill take 1 before breakfast and supper daily.  Dispense 60 with 11 refills

## 2022-02-28 NOTE — Telephone Encounter (Signed)
Noted. Will send in to pharmacy on file in patient's record.

## 2022-02-28 NOTE — H&P (Signed)
$'@LOGO'e$ @   Primary Care Physician:  Ludwig Clarks, FNP Primary Gastroenterologist:  Dr. Gala Romney  Pre-Procedure History & Physical: HPI:  Daisy Black is a 71 y.o. female here for further evaluation of recurrent esophageal dysphagia.  History of Schatzki's ring dilated to 21 [place] and disrupted with biopsy forceps 2019.  Long-term improvement until recently.  GERD suboptimally managed on pantoprazole twice daily.  Switch to Prevacid which is doing even less of a job in controlling her reflux symptoms these days.  Eliquis held on 7/30.  Past Medical History:  Diagnosis Date   Anemia    Arthritis    Asthma    Atrial fibrillation ([place])    CLL (chronic lymphocytic leukemia) ([place])    Dysrhythmia    Essential hypertension    GERD (gastroesophageal reflux disease)    History of hiatal hernia    Hyperlipidemia    Paroxysmal atrial fibrillation ([facility])    Splenomegaly     Past Surgical History:  Procedure Laterality Date   ANKLE SURGERY Right    repair fracture   CARDIOVERSION N/A 03/20/2016   Procedure: CARDIOVERSION;  Surgeon: Satira Sark, MD;  Location: [facility];  Service: Cardiovascular;  Laterality: N/A;   CARDIOVERSION N/A 05/22/2016   Procedure: CARDIOVERSION;  Surgeon: Satira Sark, MD;  Location: [facility];  Service: Cardiovascular;  Laterality: N/A;   CARDIOVERSION N/A 11/20/2021   Procedure: CARDIOVERSION;  Surgeon: Werner Lean, MD;  Location: [facility];  Service: Cardiovascular;  Laterality: N/A;   CARDIOVERSION N/A 11/24/2021   Procedure: CARDIOVERSION;  Surgeon: Satira Sark, MD;  Location: [facility];  Service: Cardiovascular;  Laterality: N/A;   CHOLECYSTECTOMY     COLONOSCOPY WITH PROPOFOL N/A 03/27/2018   Internal hemorrhoids, diverticulosis in sigmoid and descending colon, three 4-6 mm polyps at IC valve. tubular adenomas 5 year surveillance   ESOPHAGOGASTRODUODENOSCOPY (EGD) WITH PROPOFOL N/A 03/27/2018   Moderate Schatzki's ring s/p dilation, small  hiatal hernia   HERNIA REPAIR  2011   Incisional and umbilical utilizing mesh   MALONEY DILATION N/A 03/27/2018   Procedure: Venia Minks DILATION;  Surgeon: Daneil Dolin, MD;  Location: [facility];  Service: Endoscopy;  Laterality: N/A;   POLYPECTOMY  03/27/2018   Procedure: POLYPECTOMY;  Surgeon: Daneil Dolin, MD;  [facility];  Service: Endoscopy;;  colon   ROTATOR CUFF REPAIR Left    TEE WITHOUT CARDIOVERSION N/A 01/17/2016   Procedure: TRANSESOPHAGEAL ECHOCARDIOGRAM (TEE);  Surgeon: Herminio Commons, MD;  Location: [facility];  Service: Cardiovascular;  Laterality: N/A;   TEE WITHOUT CARDIOVERSION N/A 02/21/2016   Procedure: TRANSESOPHAGEAL ECHOCARDIOGRAM (TEE) WITH PROPOFOL;  Surgeon: Herminio Commons, MD;  Location: [facility];  Service: Cardiovascular;  Laterality: N/A;   TEE WITHOUT CARDIOVERSION N/A 03/20/2016   Procedure: TRANSESOPHAGEAL ECHOCARDIOGRAM (TEE) WITH PROPOFOL;  Surgeon: Satira Sark, MD;  Location: [facility];  Service: Cardiovascular;  Laterality: N/A;   TONSILLECTOMY     TOTAL KNEE ARTHROPLASTY  2022    Prior to Admission medications   Medication Sig Start Date End Date Taking? Authorizing Provider  acetaminophen (TYLENOL) 650 MG CR tablet Take 650-1,300 mg by mouth daily as needed for pain.   Yes [provider]  amiodarone (PACERONE) 200 MG tablet Take 1 tablet (200 mg total) by mouth daily. 12/01/21 08/28/22 Yes Strader, Fransisco Hertz, PA-C  apixaban (ELIQUIS) 5 MG TABS tablet Take 1 tablet (5 mg total) by mouth 2 (two) times daily. 12/01/21  Yes Strader, Fransisco Hertz, PA-C  cetirizine (ZYRTEC) 10 MG tablet Take 10 mg by mouth daily.   Yes [provider]  Chlorpheniramine Maleate (CHLOR-TABLETS PO) Take 2 tablets by mouth daily as needed (allergies).   Yes [provider]  docusate sodium (COLACE) 100 MG capsule Take 200 mg by mouth every morning.   Yes [provider]  fluticasone (FLONASE) 50 MCG/ACT nasal spray Place  1 spray into both nostrils daily as needed for allergies or rhinitis.   Yes [provider]  furosemide (LASIX) 20 MG tablet Take 2 tablets (40 mg total) by mouth daily as needed for fluid. 12/19/21  Yes Satira Sark, MD  lansoprazole (PREVACID) 30 MG capsule Take 1 capsule (30 mg total) by mouth daily at 12 noon. 12/28/21  Yes Mahon, Courtney L, NP  levothyroxine (SYNTHROID, LEVOTHROID) 125 MCG tablet Take 125 mcg by mouth daily before breakfast.   Yes [provider]  lidocaine (XYLOCAINE) 2 % solution TAKE 15ML BY MOUTH AS DIRECTED EVERY 6 HOURS AS NEEDED FOR MOUTH PAIN 12/18/21  Yes Annitta Needs, NP  metoprolol tartrate (LOPRESSOR) 25 MG tablet Take 1 tablet (25 mg total) by mouth 2 (two) times daily. 12/04/21  Yes Strader, [place] M, PA-C  Polyethyl Glycol-Propyl Glycol (SYSTANE OP) Place 1-2 drops into both eyes daily.   Yes [provider]  promethazine (PHENERGAN) 25 MG tablet Take 1 tablet (25 mg total) by mouth every 6 (six) hours as needed for nausea or vomiting. 09/12/21  Yes Lloyd Huger, MD  hydrocortisone (ANUSOL-HC) 2.5 % rectal cream Place 1 application rectally 2 (two) times daily. Patient taking differently: Place 1 application  rectally daily as needed for hemorrhoids. 08/10/21   Annitta Needs, NP    Allergies as of 01/25/2022 - Review Complete 12/28/2021  Allergen Reaction Noted   Flagyl [metronidazole]  05/01/2019    Family History  Problem Relation Age of Onset   Ovarian cancer Mother 65       Secondary to ovarian cancer   Leukemia Mother    Stroke Father 69       Brain stem infarction   Breast cancer Paternal Aunt    Crohn's disease Son    Colon cancer Neg Hx     Social History   Socioeconomic History   Marital status: Married    Spouse name: Not on file   Number of children: 9   Years of education: Not on file   Highest education level: Not on file  Occupational History   Not on file  Tobacco Use   Smoking status:  Former    Packs/day: 0.25    Years: 2.00    Total pack years: 0.50    Types: Cigarettes    Quit date: 08/03/1975    Years since quitting: 46.6   Smokeless tobacco: Never  Vaping Use   Vaping Use: Never used  Substance and Sexual Activity   Alcohol use: No    Alcohol/week: 0.0 standard drinks of alcohol   Drug use: No   Sexual activity: Yes    Birth control/protection: Post-menopausal  Other Topics Concern   Not on file  Social History Narrative   Not on file   Social Determinants of Health   Financial Resource Strain: Not on file  Food Insecurity: Not on file  Transportation Needs: Not on file  Physical Activity: Not on file  Stress: Not on file  Social Connections: Not on file  Intimate Partner Violence: Not on file    Review of Systems:  See HPI, otherwise negative ROS  Physical Exam: BP 111/64   Pulse (!) 57   Temp 98.5 F (36.9 C) (Oral)   Resp 18   SpO2 97%  General:   Alert,  Well-developed, well-nourished, pleasant and cooperative in NAD Neck:  Supple; no masses or thyromegaly. No significant cervical adenopathy. Lungs:  Clear throughout to auscultation.   No wheezes, crackles, or rhonchi. No acute distress. Heart:  Regular rate and rhythm; no murmurs, clicks, rubs,  or gallops. Abdomen: Non-distended, normal bowel sounds.  Soft and nontender without appreciable mass or hepatosplenomegaly.  Pulses:  Normal pulses noted. Extremities:  Without clubbing or edema.  Impression/Plan: 71 year old lady with GERD and history of Schatzki's ring presenting with recurrent esophageal dysphagia.  Her ring is likely tightened up.  GERD suboptimally managed.  I have offered the patient EGD with esophageal dilation as feasible/appropriate today per plan.  The risks, benefits, limitations, alternatives and imponderables have been reviewed with the patient. Potential for esophageal dilation, biopsy, etc. have also been reviewed.  Questions have been answered. All parties  agreeable.      Notice: This dictation was prepared with Dragon dictation along with smaller phrase technology. Any transcriptional errors that result from this process are unintentional and may not be corrected upon review.

## 2022-02-28 NOTE — Op Note (Signed)
[facility] Patient Name: Daisy Black Procedure Date: 02/28/2022 9:10 AM MRN: [id number] Date of Birth: 1951/04/11 Attending MD: Norvel Richards , MD CSN: [id number] Age: 71 Admit Type: Outpatient Procedure:                Upper GI endoscopy Indications:              Dysphagia Providers:                Norvel Richards, MD, Lurline Del, RN, Raphael Gibney, Technician Referring MD:              Medicines:                Propofol per Anesthesia Complications:            No immediate complications. Estimated Blood Loss:     Estimated blood loss was minimal. Procedure:                Pre-Anesthesia Assessment:                           - Prior to the procedure, a History and Physical                            was performed, and patient medications and                            allergies were reviewed. The patient's tolerance of                            previous anesthesia was also reviewed. The risks                            and benefits of the procedure and the sedation                            options and risks were discussed with the patient.                            All questions were answered, and informed consent                            was obtained. Prior Anticoagulants: The patient                            last took Eliquis (apixaban) 3 days prior to the                            procedure. ASA Grade Assessment: III - A patient                            with severe systemic disease. After reviewing the  risks and benefits, the patient was deemed in                            satisfactory condition to undergo the procedure.                           After obtaining informed consent, the endoscope was                            passed under direct vision. Throughout the                            procedure, the patient's blood pressure, pulse, and                            oxygen saturations were  monitored continuously. The                            GIF-H190 ([id number]) scope was introduced through the                            mouth, and advanced to the second part of duodenum.                            The upper GI endoscopy was accomplished without                            difficulty. The patient tolerated the procedure                            well. Scope In: 9:37:51 AM Scope Out: 9:49:47 AM Total Procedure Duration: 0 hours 11 minutes 56 seconds  Findings:      A mild Schatzki ring was found at the gastroesophageal junction.       Esophagus was patent. Otherwise, appeared normal.      Focal antral erosions; otherwise, gastric mucosa appeared normal. Patent       pylorus.      The duodenal bulb and second portion of the duodenum were normal. The       scope was withdrawn. Dilation was performed with a Maloney dilator with       mild resistance at 78 Fr. The scope was withdrawn. Dilation was       performed with a Maloney dilator with mild resistance at 56 Fr. The       dilation site was examined following endoscope reinsertion and showed       mild mucosal disruption. Estimated blood loss was minimal. Finally, the       abnormal gastric mucosa was biopsied. Impression:               - Mild Schatzki ring. Dilated. Gastric erosions of                            uncertain significance?"status post biopsy following  dilation.                           - Normal duodenal bulb and second portion of the                            duodenum.                           -Patient states lansoprazole not doing as well is                            pantoprazole and pantoprazole was not very                            effective in managing her reflux symptoms. Moderate Sedation:      Moderate (conscious) sedation was personally administered by an       anesthesia professional. The following parameters were monitored: oxygen       saturation, heart rate,  blood pressure, respiratory rate, EKG, adequacy       of pulmonary ventilation, and response to care. Recommendation:           - Patient has a contact number available for                            emergencies. The signs and symptoms of potential                            delayed complications were discussed with the                            patient. Return to normal activities tomorrow.                            Written discharge instructions were provided to the                            patient.                           - Advance diet as tolerated.                           -Stop lansoprazole; begin Aciphex or rabeprazole 20                            mg twice daily before breakfast and supper. New                            prescription provided.-                           - Await pathology results.                           - Return to my office in 6 weeks. Procedure Code(s):        ---  Professional ---                           [phone], Esophagogastroduodenoscopy, flexible,                            transoral; diagnostic, including collection of                            specimen(s) by brushing or washing, when performed                            (separate procedure)                           43450, Dilation of esophagus, by unguided sound or                            bougie, single or multiple passes Diagnosis Code(s):        --- Professional ---                           K22.2, Esophageal obstruction                           R13.10, Dysphagia, unspecified CPT copyright 2019 [employer]. All rights reserved. The codes documented in this report are preliminary and upon coder review may  be revised to meet current compliance requirements. Cristopher Estimable. Jacoya Bauman, MD Norvel Richards, MD 02/28/2022 10:13:13 AM This report has been signed electronically. Number of Addenda: 0

## 2022-02-28 NOTE — Transfer of Care (Signed)
Immediate Anesthesia Transfer of Care Note  Patient: Daisy Black  Procedure(s) Performed: ESOPHAGOGASTRODUODENOSCOPY (EGD) WITH PROPOFOL [place]  Patient Location: PACU  Anesthesia Type:General  Level of Consciousness: awake, alert  and oriented  Airway & Oxygen Therapy: Patient Spontanous Breathing  Post-op Assessment: Report given to RN, Post -op Vital signs reviewed and stable, Patient moving all extremities X 4 and Patient able to stick tongue midline  Post vital signs: Reviewed  Last Vitals:  Vitals Value Taken Time  BP 104/57 02/28/22 0959  Temp 36.5 C 02/28/22 0959  Pulse 54 02/28/22 0959  Resp 18 02/28/22 0959  SpO2 96 % 02/28/22 0959    Last Pain:  Vitals:   02/28/22 0959  TempSrc: Oral  PainSc: 0-No pain      Patients Stated Pain Goal: 7 (78/24/23 5361)  Complications: No notable events documented.

## 2022-03-01 LAB — SURGICAL PATHOLOGY

## 2022-03-02 NOTE — Anesthesia Postprocedure Evaluation (Signed)
Anesthesia Post Note  Patient: Daisy Black  Procedure(s) Performed: ESOPHAGOGASTRODUODENOSCOPY (EGD) WITH PROPOFOL [place]  Patient location during evaluation: Phase II Anesthesia Type: General Level of consciousness: awake Pain management: pain level controlled Vital Signs Assessment: post-procedure vital signs reviewed and stable Respiratory status: spontaneous breathing and respiratory function stable Cardiovascular status: blood pressure returned to baseline and stable Postop Assessment: no headache and no apparent nausea or vomiting Anesthetic complications: no Comments: Late entry   No notable events documented.   Last Vitals:  Vitals:   02/28/22 0824 02/28/22 0959  BP: 111/64 (!) 104/57  Pulse: (!) 57 (!) 54  Resp: 18 18  Temp: 36.9 C 36.5 C  SpO2: 97% 96%    Last Pain:  Vitals:   03/01/22 1458  TempSrc:   PainSc: [place]

## 2022-03-04 ENCOUNTER — Encounter: Payer: Self-pay | Admitting: Internal Medicine

## 2022-03-06 ENCOUNTER — Encounter (HOSPITAL_COMMUNITY): Payer: Self-pay | Admitting: Internal Medicine

## 2022-03-12 ENCOUNTER — Other Ambulatory Visit: Payer: Medicare Other

## 2022-03-12 ENCOUNTER — Ambulatory Visit: Payer: Medicare Other

## 2022-03-15 ENCOUNTER — Ambulatory Visit: Payer: Medicare Other | Admitting: Oncology

## 2022-04-11 ENCOUNTER — Ambulatory Visit (INDEPENDENT_AMBULATORY_CARE_PROVIDER_SITE_OTHER): Payer: Medicare Other | Admitting: Gastroenterology

## 2022-04-11 ENCOUNTER — Encounter: Payer: Self-pay | Admitting: Gastroenterology

## 2022-04-11 VITALS — BP 130/75 | HR 78 | Temp 97.5°F | Ht 68.0 in | Wt 240.0 lb

## 2022-04-11 DIAGNOSIS — R1013 Epigastric pain: Secondary | ICD-10-CM | POA: Diagnosis not present

## 2022-04-11 DIAGNOSIS — K219 Gastro-esophageal reflux disease without esophagitis: Secondary | ICD-10-CM | POA: Diagnosis not present

## 2022-04-11 MED ORDER — SUCRALFATE 1 GM/10ML PO SUSP: 1.0000 g | Freq: Three times a day (TID) | ORAL | 0 refills | Status: DC

## 2022-04-11 NOTE — Patient Instructions (Addendum)
Take rabeprazole '20mg'$  twice daily, 30 minutes before breakfast and supper. Try sucralfate one gram up to four times daily for abdominal pain, burning.  If your abdominal pain is not better in the next 24-48 hours please call. Call no later than Friday morning.  I will review your upcoming CT scans when available.

## 2022-04-11 NOTE — Progress Notes (Signed)
GI Office Note    Referring Provider: Ludwig Clarks, FNP Primary Care Physician:  Ludwig Clarks, FNP  Primary Gastroenterologist: Garfield Cornea, MD   Chief Complaint   Chief Complaint  Patient presents with   Follow-up    Having some abd pain and issues with reflux.     History of Present Illness   Daisy Black is a 71 y.o. female presenting today for follow up. Last seen in office in 12/2021. She has history of diverticulitis, epigastric pain, GERD.   She reports doing well since her EGD a month ago. Was transitioned from lansoprazole to rabeprazole after her procedure. Monday she started having epigastric pain radiating into the chest similar to symptoms she was having before. Some nausea but no vomiting. Burning pain. Also some pain in LUQ. BM regular. No melena, brbpr. She has been taking rabeprazole in the morning but does not typically eat afterwards. She takes another dose in the evening.   History of splenomegaly and CLL. She is having CT chest/abd/pelvis next month.   She had CT documented uncomplicated diverticulitis at junction of descending and sigmoid colon in 10/2021.    CT pelvis with contrast May 2023: Splenomegaly with similar to slightly enlarged abdominal pelvic adenopathy, compatible with known history of CLL.  EGD 02/2022: - Mild Schatzki ring. Dilated.  -Gastric erosions of uncertain significance?status post biopsy,antral mucosa with focal erosion and reactive changes, no H.pylori - Normal duodenal bulb and second portion of the duodenum. -Patient states lansoprazole not doing as well is pantoprazole and pantoprazole was not very effective in managing her reflux symptoms.   Colonoscopy 2019, internal hemorrhoids, diverticulosis in the sigmoid and descending colon, three 4-6 mm polyps at icv. Due for surveillance colonoscopy in 2024.    Medications   Current Outpatient Medications  Medication Sig Dispense Refill   acetaminophen (TYLENOL) 650 MG  CR tablet Take 650-1,300 mg by mouth daily as needed for pain.     amiodarone (PACERONE) 200 MG tablet Take 1 tablet (200 mg total) by mouth daily. 90 tablet 2   apixaban (ELIQUIS) 5 MG TABS tablet Take 1 tablet (5 mg total) by mouth 2 (two) times daily. 180 tablet 3   cetirizine (ZYRTEC) 10 MG tablet Take 10 mg by mouth daily.     Chlorpheniramine Maleate (CHLOR-TABLETS PO) Take 2 tablets by mouth daily as needed (allergies).     docusate sodium (COLACE) 100 MG capsule Take 200 mg by mouth every morning.     fluticasone (FLONASE) 50 MCG/ACT nasal spray Place 1 spray into both nostrils daily as needed for allergies or rhinitis.     furosemide (LASIX) 20 MG tablet Take 2 tablets (40 mg total) by mouth daily as needed for fluid. 180 tablet 2   hydrocortisone (ANUSOL-HC) 2.5 % rectal cream Place 1 application rectally 2 (two) times daily. (Patient taking differently: Place 1 application  rectally daily as needed for hemorrhoids.) 30 g 1   levothyroxine (SYNTHROID, LEVOTHROID) 125 MCG tablet Take 125 mcg by mouth daily before breakfast.     lidocaine (XYLOCAINE) 2 % solution TAKE 15ML BY MOUTH AS DIRECTED EVERY 6 HOURS AS NEEDED FOR MOUTH PAIN 100 mL 2   metoprolol tartrate (LOPRESSOR) 25 MG tablet Take 1 tablet (25 mg total) by mouth 2 (two) times daily. 180 tablet 3   Polyethyl Glycol-Propyl Glycol (SYSTANE OP) Place 1-2 drops into both eyes daily.     promethazine (PHENERGAN) 25 MG tablet Take 1 tablet (25 mg  total) by mouth every 6 (six) hours as needed for nausea or vomiting. 60 tablet 1   RABEprazole (ACIPHEX) 20 MG tablet Take 1 tablet (20 mg total) by mouth 2 (two) times daily before a meal. 60 tablet 11   No current facility-administered medications for this visit.   Facility-Administered Medications Ordered in Other Visits  Medication Dose Route Frequency Provider Last Rate Last Admin   hydrocortisone cream 1 % 1 application  1 application  Topical TID PRN Satira Sark, MD         Allergies   Allergies as of 04/11/2022 - Review Complete 04/11/2022  Allergen Reaction Noted   Flagyl [metronidazole] Other (See Comments) 05/01/2019     Review of Systems   General: Negative for anorexia, weight loss, fever, chills, fatigue, weakness. ENT: Negative for hoarseness, difficulty swallowing , nasal congestion. CV: Negative for chest pain, angina, palpitations, dyspnea on exertion, peripheral edema.  Respiratory: Negative for dyspnea at rest, dyspnea on exertion, cough, sputum, wheezing.  GI: See history of present illness. GU:  Negative for dysuria, hematuria, urinary incontinence, urinary frequency, nocturnal urination.  Endo: Negative for unusual weight change.     Physical Exam   BP 130/75 (BP Location: Left Arm, Patient Position: Sitting, Cuff Size: Normal)   Pulse 78   Temp (!) 97.5 F (36.4 C) (Temporal)   Ht '5\' 8"'$  (1.727 m)   Wt 240 lb (108.9 kg)   SpO2 97%   BMI 36.49 kg/m    General: Well-nourished, well-developed in no acute distress.  Eyes: No icterus. Mouth: Oropharyngeal mucosa moist and pink , no lesions erythema or exudate. Lungs: Clear to auscultation bilaterally.  Heart: Regular rate and rhythm, no murmurs rubs or gallops.  Abdomen: Bowel sounds are normal, nondistended, no hepatosplenomegaly or masses, no abdominal bruits or hernia , no rebound or guarding. Mild epigastric tenderness Rectal: not performed  Extremities: No lower extremity edema. No clubbing or deformities. Neuro: Alert and oriented x 4   Skin: Warm and dry, no jaundice.   Psych: Alert and cooperative, normal mood and affect.  Labs   Lab Results  Component Value Date   CREATININE 0.87 02/26/2022   BUN 15 02/26/2022   NA 143 02/26/2022   K 3.7 02/26/2022   CL 112 (H) 02/26/2022   CO2 25 02/26/2022   Lab Results  Component Value Date   ALT 17 12/04/2021   AST 29 12/04/2021   ALKPHOS 128 (H) 12/04/2021   BILITOT 1.0 12/04/2021   Lab Results  Component Value  Date   WBC 28.3 (H) 12/04/2021   HGB 14.0 12/04/2021   HCT 45.2 12/04/2021   MCV 93.8 12/04/2021   PLT 149 (L) 12/04/2021   Lab Results  Component Value Date   INR 1.1 11/20/2021   INR 2.6 01/25/2010   INR 1.8 01/11/2010   Lab Results  Component Value Date   TSH 1.987 11/17/2021   Lab Results  Component Value Date   LIPASE 35 12/04/2021    Imaging Studies   No results found.  Assessment   GERD/epigastric pain: typical heartburn doing okay. Recurrent epigastric pain started two days ago. Burning quality. Gastric erosions noted last month on EGD. Could be flare of GERD/gastritis. Splenomegaly could be playing a role. Doubt diverticulitis. CT documented episodes in 10/2021 associated with LLQ pain.     PLAN   Take rabeprazole '20mg'$  BID, 30 minutes before breakfast and supper. Add sucralfate 1 gram qid short term. She will call before end of week  if abdominal pain not improved.  Review upcoming CTs as available.  Laureen Ochs. Bobby Rumpf, [place], [facility]

## 2022-05-01 ENCOUNTER — Ambulatory Visit: Payer: Medicare Other

## 2022-05-06 NOTE — Progress Notes (Signed)
[place]  Telephone:([phone]) [phone] Fax:(336) [phone]  ID: Daisy Black OB: 10-15-1950  MR#: [id number]  PZW#:[id number]  Patient Care Team: Ludwig Clarks, FNP as PCP - General (Family Medicine) Satira Sark, MD as PCP - Cardiology (Cardiology) Aviva Signs, MD (General Surgery) Rico Junker, RN as Registered Nurse Theodore Demark, RN (Inactive) as Registered Nurse Satira Sark, MD as Consulting Physician (Cardiology) Daneil Dolin, MD as Consulting Physician (Gastroenterology) Lloyd Huger, MD as Consulting Physician (Hematology and Oncology) Ludwig Clarks, FNP (Family Medicine)  CHIEF COMPLAINT: CLL  INTERVAL HISTORY: Patient returns to clinic today for repeat laboratory work, further evaluation, and discussion of her imaging results.  She continues to feel well and is asymptomatic. She does not complain of any weakness or fatigue. She has no neurologic complaints.  She denies any fevers, chills, or night sweats.  She has no chest pain, shortness of breath, cough, or hemoptysis. She denies any nausea, vomiting, constipation, or diarrhea.  She has no urinary complaints.  Patient offers no specific complaints today.  REVIEW OF SYSTEMS:   Review of Systems  Constitutional: Negative.  Negative for diaphoresis, fever, malaise/fatigue and weight loss.  Respiratory: Negative.  Negative for cough and shortness of breath.   Cardiovascular: Negative.  Negative for chest pain and leg swelling.  Gastrointestinal: Negative.  Negative for abdominal pain, blood in stool, constipation, diarrhea, heartburn, melena, nausea and vomiting.  Genitourinary: Negative.  Negative for flank pain.  Musculoskeletal:  Negative for back pain and joint pain.  Skin: Negative.  Negative for rash.  Neurological: Negative.  Negative for dizziness, sensory change, focal weakness, weakness and headaches.  Psychiatric/Behavioral: Negative.  The patient is not  nervous/anxious.     As per HPI. Otherwise, a complete review of systems is negative.  PAST MEDICAL HISTORY: Past Medical History:  Diagnosis Date   Anemia    Arthritis    Asthma    Atrial fibrillation ([place])    CLL (chronic lymphocytic leukemia) ([place])    Dysrhythmia    Essential hypertension    GERD (gastroesophageal reflux disease)    History of hiatal hernia    Hyperlipidemia    Paroxysmal atrial fibrillation ([facility])    Splenomegaly     PAST SURGICAL HISTORY: Past Surgical History:  Procedure Laterality Date   ANKLE SURGERY Right    repair fracture   BIOPSY  02/28/2022   Procedure: BIOPSY;  Surgeon: Daneil Dolin, MD;  [facility];  Service: Endoscopy;;  gastric   CARDIOVERSION N/A 03/20/2016   Procedure: CARDIOVERSION;  Surgeon: Satira Sark, MD;  Location: [facility];  Service: Cardiovascular;  Laterality: N/A;   CARDIOVERSION N/A 05/22/2016   Procedure: CARDIOVERSION;  Surgeon: Satira Sark, MD;  Location: [facility];  Service: Cardiovascular;  Laterality: N/A;   CARDIOVERSION N/A 11/20/2021   Procedure: CARDIOVERSION;  Surgeon: Werner Lean, MD;  Location: [facility];  Service: Cardiovascular;  Laterality: N/A;   CARDIOVERSION N/A 11/24/2021   Procedure: CARDIOVERSION;  Surgeon: Satira Sark, MD;  Location: [facility];  Service: Cardiovascular;  Laterality: N/A;   CHOLECYSTECTOMY     COLONOSCOPY WITH PROPOFOL N/A 03/27/2018   Internal hemorrhoids, diverticulosis in sigmoid and descending colon, three 4-6 mm polyps at IC valve. tubular adenomas 5 year surveillance   ESOPHAGOGASTRODUODENOSCOPY (EGD) WITH PROPOFOL N/A 03/27/2018   Moderate Schatzki's ring s/p dilation, small hiatal hernia   ESOPHAGOGASTRODUODENOSCOPY (EGD) WITH PROPOFOL N/A 02/28/2022   Procedure: ESOPHAGOGASTRODUODENOSCOPY (EGD) WITH PROPOFOL;  Surgeon: Daneil Dolin, MD;  [facility];  Service: Endoscopy;  Laterality: N/A;  8:15am   HERNIA REPAIR  2011   Incisional and  umbilical utilizing mesh   MALONEY DILATION N/A 03/27/2018   Procedure: Venia Minks DILATION;  Surgeon: Daneil Dolin, MD;  [facility];  Service: Endoscopy;  Laterality: N/A;   MALONEY DILATION N/A 02/28/2022   Procedure: Venia Minks DILATION;  Surgeon: Daneil Dolin, MD;  [facility];  Service: Endoscopy;  Laterality: N/A;   POLYPECTOMY  03/27/2018   Procedure: POLYPECTOMY;  Surgeon: Daneil Dolin, MD;  [facility];  Service: Endoscopy;;  colon   ROTATOR CUFF REPAIR Left    TEE WITHOUT CARDIOVERSION N/A 01/17/2016   Procedure: TRANSESOPHAGEAL ECHOCARDIOGRAM (TEE);  Surgeon: Herminio Commons, MD;  [facility];  Service: Cardiovascular;  Laterality: N/A;   TEE WITHOUT CARDIOVERSION N/A 02/21/2016   Procedure: TRANSESOPHAGEAL ECHOCARDIOGRAM (TEE) WITH PROPOFOL;  Surgeon: Herminio Commons, MD;  [facility];  Service: Cardiovascular;  Laterality: N/A;   TEE WITHOUT CARDIOVERSION N/A 03/20/2016   Procedure: TRANSESOPHAGEAL ECHOCARDIOGRAM (TEE) WITH PROPOFOL;  Surgeon: Satira Sark, MD;  [facility]: [facility];  Service: Cardiovascular;  Laterality: N/A;   TONSILLECTOMY     TOTAL KNEE ARTHROPLASTY  2022    FAMILY HISTORY Family History  Problem Relation Age of Onset   Ovarian cancer Mother 63       Secondary to ovarian cancer   Leukemia Mother    Stroke Father 73       Brain stem infarction   Breast cancer Paternal Aunt    Crohn's disease Son    Colon cancer Neg Hx        ADVANCED DIRECTIVES:    HEALTH MAINTENANCE: Social History   Tobacco Use   Smoking status: Former    Packs/day: 0.25    Years: 2.00    Total pack years: 0.50    Types: Cigarettes    Quit date: 08/03/1975    Years since quitting: 46.8   Smokeless tobacco: Never  Vaping Use   Vaping Use: Never used  Substance Use Topics   Alcohol use: No    Alcohol/week: 0.0 standard drinks of alcohol   Drug use: No     Colonoscopy:  PAP:  Bone density:  Lipid  panel:  Allergies  Allergen Reactions   Flagyl [Metronidazole] Other (See Comments)    Classic side effect of severe nausea, unable to tolerate.     Current Outpatient Medications  Medication Sig Dispense Refill   acetaminophen (TYLENOL) 650 MG CR tablet Take 650-1,300 mg by mouth daily as needed for pain.     amiodarone (PACERONE) 200 MG tablet Take 1 tablet (200 mg total) by mouth daily. 90 tablet 2   apixaban (ELIQUIS) 5 MG TABS tablet Take 1 tablet (5 mg total) by mouth 2 (two) times daily. 180 tablet 3   cetirizine (ZYRTEC) 10 MG tablet Take 10 mg by mouth daily.     Chlorpheniramine Maleate (CHLOR-TABLETS PO) Take 2 tablets by mouth daily as needed (allergies).     docusate sodium (COLACE) 100 MG capsule Take 200 mg by mouth every morning.     fluticasone (FLONASE) 50 MCG/ACT nasal spray Place 1 spray into both nostrils daily as needed for allergies or rhinitis.     furosemide (LASIX) 20 MG tablet Take 2 tablets (40 mg total) by mouth daily as needed for fluid. 180 tablet 2   hydrocortisone (ANUSOL-HC) 2.5 %  rectal cream Place 1 application rectally 2 (two) times daily. (Patient taking differently: Place 1 application  rectally daily as needed for hemorrhoids.) 30 g 1   levothyroxine (SYNTHROID, LEVOTHROID) 125 MCG tablet Take 125 mcg by mouth daily before breakfast.     lidocaine (XYLOCAINE) 2 % solution TAKE 15ML BY MOUTH AS DIRECTED EVERY 6 HOURS AS NEEDED FOR MOUTH PAIN 100 mL 2   metoprolol tartrate (LOPRESSOR) 25 MG tablet Take 1 tablet (25 mg total) by mouth 2 (two) times daily. 180 tablet 3   Polyethyl Glycol-Propyl Glycol (SYSTANE OP) Place 1-2 drops into both eyes daily.     RABEprazole (ACIPHEX) 20 MG tablet Take 1 tablet (20 mg total) by mouth 2 (two) times daily before a meal. 60 tablet 11   sucralfate (CARAFATE) 1 GM/10ML suspension Take 10 mLs (1 g total) by mouth 4 (four) times daily -  with meals and at bedtime. As needed for abdominal burning/pain. 1200 mL 0    promethazine (PHENERGAN) 25 MG tablet Take 1 tablet (25 mg total) by mouth every 6 (six) hours as needed for nausea or vomiting. 60 tablet 1   No current facility-administered medications for this visit.   Facility-Administered Medications Ordered in Other Visits  Medication Dose Route Frequency Provider Last Rate Last Admin   hydrocortisone cream 1 % 1 application  1 application  Topical TID PRN Satira Sark, MD        OBJECTIVE: Vitals:   05/10/22 1410  BP: 131/64  Pulse: 80  Resp: 16  Temp: 98.7 F (37.1 C)  SpO2: 97%     Body mass index is 36.95 kg/m.    ECOG FS:0 - Asymptomatic  General: Well-developed, well-nourished, no acute distress. Eyes: Pink conjunctiva, anicteric sclera. HEENT: Normocephalic, moist mucous membranes. Lungs: No audible wheezing or coughing. Heart: Regular rate and rhythm. Abdomen: Soft, nontender, no obvious distention. Musculoskeletal: No edema, cyanosis, or clubbing. Neuro: Alert, answering all questions appropriately. Cranial nerves grossly intact. Skin: No rashes or petechiae noted. Psych: Normal affect.  LAB RESULTS:  Lab Results  Component Value Date   NA 143 02/26/2022   K 3.7 02/26/2022   CL 112 (H) 02/26/2022   CO2 25 02/26/2022   GLUCOSE 90 02/26/2022   BUN 15 02/26/2022   CREATININE 0.80 05/08/2022   CALCIUM 9.5 02/26/2022   PROT 7.1 12/04/2021   ALBUMIN 4.4 12/04/2021   AST 29 12/04/2021   ALT 17 12/04/2021   ALKPHOS 128 (H) 12/04/2021   BILITOT 1.0 12/04/2021   GFRNONAA >60 02/26/2022   GFRAA >60 03/09/2020    Lab Results  Component Value Date   WBC 28.7 (H) 05/10/2022   NEUTROABS PENDING 05/10/2022   HGB 13.0 05/10/2022   HCT 41.0 05/10/2022   MCV 93.2 05/10/2022   PLT 129 (L) 05/10/2022     STUDIES: CT CHEST ABDOMEN PELVIS W CONTRAST  Result Date: 05/09/2022 CLINICAL DATA:  Chronic lymphocytic leukemia. Restaging. COVID-19 in 2021. Cholecystectomy. Hernia repairs. Ex-smoker. * Tracking Code: BO *  EXAM: CT CHEST, ABDOMEN, AND PELVIS WITH CONTRAST TECHNIQUE: Multidetector CT imaging of the chest, abdomen and pelvis was performed following the standard protocol during bolus administration of intravenous contrast. RADIATION DOSE REDUCTION: This exam was performed according to the departmental dose-optimization program which includes automated exposure control, adjustment of the mA and/or kV according to patient size and/or use of iterative reconstruction technique. CONTRAST:  111m OMNIPAQUE IOHEXOL 300 MG/ML  SOLN COMPARISON:  12/04/2021.  Most recent chest CT 01/11/2021 FINDINGS: CT  CHEST FINDINGS Cardiovascular: Tortuous thoracic aorta. Aortic atherosclerosis. Normal heart size, without pericardial effusion. No central pulmonary embolism, on this non-dedicated study. Pulmonary artery enlargement, outflow tract 3.2 cm. Mediastinum/Nodes: Innumerable small low jugular and supraclavicular nodes. These appear increased in size and number compared to the prior, with an index right posterior triangle node measuring 7 mm on 04/02 versus only 2-3 mm maximally on the prior. Bilateral axillary adenopathy. A right axillary node measures 2.3 cm in 28/2 versus 1.8 cm on the prior exam (when remeasured). Subcarinal node measures 1.5 cm on 33/2 versus 8 mm on the prior exam (when remeasured). Bilateral, right greater than left hilar adenopathy is also increased. Multiple left-sided thyroid nodules including up to 1.4 cm, similar Not clinically significant; no follow-up imaging recommended (ref: J Am Coll Radiol. 2015 Feb;12(2): 143-50) Lungs/Pleura: No pleural fluid.  Minimal left base scarring. Musculoskeletal: No acute osseous abnormality. CT ABDOMEN PELVIS FINDINGS Hepatobiliary: Normal liver. Cholecystectomy, without biliary ductal dilatation. Pancreas: Normal, without mass or ductal dilatation. Spleen: The subcentimeter hypoattenuating splenic lesions are felt to be similar and are of doubtful clinical significance.  Splenomegaly at 17.2 cm craniocaudal versus 16.8 cm on the prior. Adrenals/Urinary Tract: Normal adrenal glands. Left renal too small to characterize lesions are most likely cysts . In the absence of clinically indicated signs/symptoms require(s) no independent follow-up. Normal right kidney. No hydronephrosis. Normal urinary bladder. Stomach/Bowel: The proximal stomach appears thick walled, but is underdistended. Extensive colonic diverticulosis. Normal terminal ileum and appendix. Vascular/Lymphatic: Aortic atherosclerosis. Portacaval adenopathy at 1.5 cm on 59/2 versus 1.4 cm on 12/04/2021 (when remeasured). Gastrohepatic ligament index node measures 1.3 cm on 55/2 versus 1.2 cm on the prior. Pelvic sidewall adenopathy, with a right external iliac node measuring 1.7 cm on 111/2 versus 1.6 cm on the prior exam (when remeasured). Left external iliac node measures 1.6 cm on 109/2 versus 1.7 cm on the prior. Left common iliac 1.3 cm node on 81/2 is similar to on the prior. Reproductive: Normal uterus and adnexa. Other: No significant free fluid. Musculoskeletal: No acute osseous abnormality. IMPRESSION: 1. Since the most recent chest CT of 01/11/2021, mild-to-moderate progression of lymphoma within the chest and lower neck. 2. Since the most recent abdominopelvic CT of 12/04/2021, similar to minimal progression of lymphoma, as evidenced by adenopathy and splenomegaly. 3. Pulmonary artery enlargement suggests pulmonary arterial hypertension. 4.  Aortic Atherosclerosis (ICD10-I70.0). 5. Apparent proximal gastric wall thickening is most likely due to underdistention. Correlate with symptoms of gastritis. Electronically Signed   By: Abigail Miyamoto M.D.   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  PLAN:    1. CLL: Patient completed her second round of weekly single agent Rituxan x4 on August 20, 2019.  Her most recent imaging with CT scan on May 09, 2022  reviewed independently and reported as above with mild progression of lymphadenopathy as well as splenomegaly.  Her white blood cell count remains elevated, but essentially stable at 28.7.  Patient does not wish to pursue treatment at this time.  If treatment is indicated, patient has indicated that she would prefer Imbruvica or zanubrutinib.  No intervention is needed at this time.  Return to clinic in 6 months with repeat imaging and further evaluation.   2.  Early satiety, indigestion: Patient does not complain of this today.  Likely secondary to splenomegaly.  3.  Atrial fibrillation: Continue monitoring and treatment per cardiology. 4.  Iron deficiency anemia: Resolved.  She last received IV Feraheme in May 2017.  5.  PET positive thyroid lesions: 1.2 cm right lobe nodule also seen on thyroid ultrasound April 06, 2016.  No change on recent CT scan.  Continue follow-up with ENT as indicated. 6.  Pain: Patient does not complain of this today.  Patient continues to use hydrocodone sparingly. 7.  Nausea: Patient does not complain of this today.  Continue Phenergan as needed. 8.  Thrombocytopenia: Chronic and unchanged.  Patient's platelet count is 129 today.  Likely related to underlying splenomegaly.   9.  Knee replacement: Continue follow-up with orthopedics and rehab as scheduled.  Patient expressed understanding and was in agreement with this plan. She also understands that She can call clinic at any time with any questions, concerns, or complaints.     Lloyd Huger, MD   05/10/2022 2:46 PM

## 2022-05-08 ENCOUNTER — Ambulatory Visit
Admission: RE | Admit: 2022-05-08 | Discharge: 2022-05-08 | Disposition: A | Discharge status: Home / Self Care | Payer: Medicare Other | Source: Ambulatory Visit | Attending: Oncology | Admitting: Oncology

## 2022-05-08 DIAGNOSIS — C9110 Chronic lymphocytic leukemia of B-cell type not having achieved remission: Secondary | ICD-10-CM | POA: Diagnosis present

## 2022-05-08 LAB — POCT I-STAT CREATININE: Creatinine, Ser: 0.8 mg/dL (ref 0.44–1.00)

## 2022-05-08 MED ORDER — IOHEXOL 300 MG/ML  SOLN
100.0000 mL | Freq: Once | INTRAMUSCULAR | Status: AC | PRN
  Administered 2022-05-08: 100 mL via INTRAVENOUS

## 2022-05-10 ENCOUNTER — Encounter: Payer: Self-pay | Admitting: Oncology

## 2022-05-10 ENCOUNTER — Other Ambulatory Visit: Payer: Self-pay

## 2022-05-10 ENCOUNTER — Inpatient Hospital Stay (HOSPITAL_BASED_OUTPATIENT_CLINIC_OR_DEPARTMENT_OTHER): Payer: Medicare Other | Admitting: Oncology

## 2022-05-10 ENCOUNTER — Inpatient Hospital Stay: Payer: Medicare Other | Attending: Oncology

## 2022-05-10 VITALS — BP 131/64 | HR 80 | Temp 98.7°F | Resp 16 | Ht 68.0 in | Wt 243.0 lb

## 2022-05-10 DIAGNOSIS — C9110 Chronic lymphocytic leukemia of B-cell type not having achieved remission: Secondary | ICD-10-CM | POA: Diagnosis present

## 2022-05-10 DIAGNOSIS — R110 Nausea: Secondary | ICD-10-CM | POA: Diagnosis not present

## 2022-05-10 DIAGNOSIS — D696 Thrombocytopenia, unspecified: Secondary | ICD-10-CM | POA: Insufficient documentation

## 2022-05-10 LAB — CBC WITH DIFFERENTIAL/PLATELET
Abs Immature Granulocytes: 0.04 10*3/uL (ref 0.00–0.07)
Basophils Absolute: 0.1 10*3/uL (ref 0.0–0.1)
Basophils Relative: 0 %
Eosinophils Absolute: 0.2 10*3/uL (ref 0.0–0.5)
Eosinophils Relative: 1 %
HCT: 41 % (ref 36.0–46.0)
Hemoglobin: 13 g/dL (ref 12.0–15.0)
Immature Granulocytes: 0 %
Lymphocytes Relative: 81 %
Lymphs Abs: 23.2 10*3/uL — ABNORMAL HIGH (ref 0.7–4.0)
MCH: 29.5 pg (ref 26.0–34.0)
MCHC: 31.7 g/dL (ref 30.0–36.0)
MCV: 93.2 fL (ref 80.0–100.0)
Monocytes Absolute: 2 10*3/uL — ABNORMAL HIGH (ref 0.1–1.0)
Monocytes Relative: 7 %
Neutro Abs: 3.2 10*3/uL (ref 1.7–7.7)
Neutrophils Relative %: 11 %
Platelets: 129 10*3/uL — ABNORMAL LOW (ref 150–400)
RBC: 4.4 MIL/uL (ref 3.87–5.11)
RDW: 14.9 % (ref 11.5–15.5)
WBC: 28.7 10*3/uL — ABNORMAL HIGH (ref 4.0–10.5)
nRBC: 0.1 % (ref 0.0–0.2)

## 2022-05-10 LAB — COMPREHENSIVE METABOLIC PANEL
ALT: 23 U/L (ref 0–44)
AST: 25 U/L (ref 15–41)
Albumin: 4 g/dL (ref 3.5–5.0)
Alkaline Phosphatase: 109 U/L (ref 38–126)
Anion gap: 6 (ref 5–15)
BUN: 13 mg/dL (ref 8–23)
CO2: 25 mmol/L (ref 22–32)
Calcium: 10.1 mg/dL (ref 8.9–10.3)
Chloride: 112 mmol/L — ABNORMAL HIGH (ref 98–111)
Creatinine, Ser: 0.87 mg/dL (ref 0.44–1.00)
GFR, Estimated: >60 mL/min (ref >60)
Glucose, Bld: 90 mg/dL (ref 70–99)
Potassium: 4.2 mmol/L (ref 3.5–5.1)
Sodium: 143 mmol/L (ref 135–145)
Total Bilirubin: 0.8 mg/dL (ref 0.3–1.2)
Total Protein: 6.2 g/dL — ABNORMAL LOW (ref 6.5–8.1)

## 2022-05-10 MED ORDER — PROMETHAZINE HCL 25 MG PO TABS: 25.0000 mg | ORAL_TABLET | Freq: Four times a day (QID) | ORAL | 1 refills | Status: DC | PRN

## 2022-05-10 MED ORDER — HYDROCODONE-ACETAMINOPHEN 5-325 MG PO TABS: 1.0000 | ORAL_TABLET | Freq: Four times a day (QID) | ORAL | 0 refills | Status: DC | PRN

## 2022-05-11 ENCOUNTER — Telehealth: Payer: Self-pay | Admitting: Cardiology

## 2022-05-11 ENCOUNTER — Other Ambulatory Visit: Payer: Self-pay | Admitting: *Deleted

## 2022-05-11 DIAGNOSIS — I480 Paroxysmal atrial fibrillation: Secondary | ICD-10-CM

## 2022-05-11 NOTE — Telephone Encounter (Signed)
Patient states she had a scan that showed lung scarring and she would like to discuss this with Dr. Myles Gip nurse.

## 2022-05-14 NOTE — Telephone Encounter (Signed)
Please see mychart message for details

## 2022-05-15 ENCOUNTER — Ambulatory Visit: Payer: Medicare Other | Attending: Cardiology

## 2022-05-15 DIAGNOSIS — I480 Paroxysmal atrial fibrillation: Secondary | ICD-10-CM | POA: Diagnosis present

## 2022-05-16 LAB — ECHOCARDIOGRAM COMPLETE
AR max vel: 2.47 cm2
AV Area VTI: 2.42 cm2
AV Area mean vel: 2.48 cm2
AV Mean grad: 4.8 mmHg
AV Peak grad: 9.6 mmHg
Ao pk vel: 1.55 m/s
Area-P 1/2: 3.19 cm2
Calc EF: 74.6 %
MV M vel: 2.13 m/s
MV Peak grad: 18.1 mmHg
S' Lateral: 2.85 cm
Single Plane A2C EF: 75.2 %
Single Plane A4C EF: 75.6 %

## 2022-05-23 ENCOUNTER — Telehealth: Payer: Self-pay | Admitting: Cardiology

## 2022-05-23 NOTE — Telephone Encounter (Signed)
On Eliquis for atrial fibrillation. Will route to pharmacy team for input.   CHA2DS2-VASc Score = 5   This indicates a 7.2% annual risk of stroke. The patient's score is based upon: CHF History: 1 HTN History: 1 Diabetes History: 0 Stroke History: 0 Vascular Disease History: 1 (CT with aortic atherosclerosis) Age Score: 1 Gender Score: 1   Platelet count: 129 (05/10/22) Creatinine clearance: 103 mL/min (05/10/22)  Loel Dubonnet, NP

## 2022-05-23 NOTE — Telephone Encounter (Signed)
   Pre-operative Risk Assessment    Patient Name: Daisy Black  DOB: 1950/11/13 MRN: [id number]      Request for Surgical Clearance    Procedure:   thoracic/lumbar facet inj (left)  Date of Surgery:  Clearance TBD                                 Surgeon: Dr. Chanetta Marshall Group or Practice Name:  [place] Phone number:  [phone] Fax number:  [phone]   Type of Clearance Requested:   - Pharmacy:  Hold Apixaban (Eliquis) for 3 days prior to procedure   Type of Anesthesia:  Not Indicated   Additional requests/questions:  Please advise surgeon/provider what medications should be held.  Signed, Desma Paganini   05/23/2022, 4:14 PM

## 2022-05-24 NOTE — Telephone Encounter (Signed)
   Patient Name: Daisy Black  DOB: Jun 19, 1951 MRN: [id number]  Primary Cardiologist: Rozann Lesches, MD  Chart reviewed as part of pre-operative protocol coverage.Pharmacy clearance only. Per office protocols and pharmacist review [facility] may hold Eliquis 3 days prior to planned procedure.   I will route this recommendation to the requesting party via Epic fax function and remove from pre-op pool.  Please call with questions.  Loel Dubonnet, NP 05/24/2022, 3:39 PM

## 2022-05-24 NOTE — Telephone Encounter (Signed)
Patient with diagnosis of afib on Eliquis for anticoagulation.    Procedure: thoracic/lumbar facet inj (left) Date of procedure: TBD   CHA2DS2-VASc Score = 5   This indicates a 7.2% annual risk of stroke. The patient's score is based upon: CHF History: 1 HTN History: 1 Diabetes History: 0 Stroke History: 0 Vascular Disease History: 1 (CT with aortic atherosclerosis) Age Score: 1 Gender Score: 1      CrCl 103 ml/min Platelet count 129  Per office protocol, patient can hold Eliquis for 3 days prior to procedure.    **This guidance is not considered finalized until pre-operative APP has relayed final recommendations.**

## 2022-06-12 ENCOUNTER — Telehealth: Payer: Self-pay

## 2022-06-12 NOTE — Telephone Encounter (Signed)
Please make patient an appointment, she has not been seen in 2 months. We have some virtual openings this week. Otherwise offer her first available NONurgent ov.

## 2022-06-12 NOTE — Telephone Encounter (Signed)
Pt was scheduled with courtney on 06/13/22.

## 2022-06-12 NOTE — Telephone Encounter (Signed)
Pt called stating that she is having issues with gastritis. She is having a lot of abdominal pain, gas, and her stomach gurgles with some nausea. Pt states that she doesn't feel like the aciphex is helping.

## 2022-06-13 ENCOUNTER — Encounter: Payer: Self-pay | Admitting: Gastroenterology

## 2022-06-13 ENCOUNTER — Telehealth (INDEPENDENT_AMBULATORY_CARE_PROVIDER_SITE_OTHER): Payer: Medicare Other | Admitting: Gastroenterology

## 2022-06-13 VITALS — Ht 67.0 in | Wt 236.0 lb

## 2022-06-13 DIAGNOSIS — R140 Abdominal distension (gaseous): Secondary | ICD-10-CM | POA: Diagnosis not present

## 2022-06-13 DIAGNOSIS — K219 Gastro-esophageal reflux disease without esophagitis: Secondary | ICD-10-CM | POA: Diagnosis not present

## 2022-06-13 DIAGNOSIS — R1013 Epigastric pain: Secondary | ICD-10-CM | POA: Diagnosis not present

## 2022-06-13 MED ORDER — OMEPRAZOLE 40 MG PO CPDR: 40.0000 mg | DELAYED_RELEASE_CAPSULE | Freq: Two times a day (BID) | ORAL | 3 refills | Status: DC

## 2022-06-13 NOTE — Progress Notes (Signed)
Primary Care Physician:  Ludwig Clarks, FNP  Primary Gastroenterologist: Cristopher Estimable. Rourk, MD  Patient Location: Home Reason for Visit: abdominal pain, nausea, gas  Persons present on the virtual encounter, with roles: Patient - Daisy Black; Provider - Venetia Night, NP   Total time (minutes) spent on medical discussion: 15 minutes  Virtual Visit Encounter Note Visit is conducted virtually and was requested by patient.   I connected with Daisy Black on 06/13/22 at  9:00 AM EST by video and verified that I am speaking with the correct person using two identifiers.   I discussed the limitations, risks, security and privacy concerns of performing an evaluation and management service by video and the availability of in person appointments. I also discussed with the patient that there may be a patient responsible charge related to this service. The patient expressed understanding and agreed to proceed.  Chief Complaint  Patient presents with   Abdominal Pain    Stomach pains for about a week nausea and gassy      History of Present Illness: Daisy Black is a 71 y.o. female with a history of diverticulitis, epigastric pain, GERD, anemia, asthma, HLD, HTN, A-fib on Eliquis, CLL following with [facility] presenting today with complaint of abdominal pain with nausea and gas.  History of splenomegaly and CLL.  Had recent CT imaging.   CT chest, abdomen, pelvis 05/08/2022: -Mild to moderate progression of lymphoma within the chest and lower neck -Similar minimal progression of lymphoma in the abdomen evidenced by adenopathy and splenomegaly -Pulmonary artery enlargement suggesting pulmonary arterial hypertension -Apparent proximal gastric wall thickening most likely due to under distention (recommend correlation with symptoms of gastritis)  EGD August 2023: -Mild Schatzki's ring s/p dilation -Gastric erosions of uncertain significance s/p biopsy revealing  antral mucosa with focal erosion and reactive changes, no H. Pylori -Normal duodenum -Reported lansoprazole not helping as much as pantoprazole (which also was not controlling her reflux symptoms)  Colonoscopy in 2019 with internal hemorrhoids, sided diverticulosis, and 3 polyps at the ileocecal valve.  Due for surveillance in 2024.  Last office visit 04/11/2022.  Reportedly was doing well since her EGD.  Had recently transition to rabeprazole from lansoprazole.  Recently having some epigastric pain radiating to her chest with nausea without vomiting.  Continue with burning pain and some pain in her left upper quadrant.  Reported regular BMs, no melena or BRBPR.  Advised to continue rabeprazole 20 mg twice daily, add sucralfate 1 g 4 times a day.   Today: Has been having some allergy issues for the last few weeks.   Has been having a lot of epigastric pain since starting the aciphex but last week it increased. Had made some vegetable soup with tomato juice and had eaten it almost every day last week. Still taking the aciphex twice daily and has been taking the carafate every day for the last few days and prior to that was taking it as needed. Felt a little better yesterday. Just recently started taking a probiotic as well. Nausea has gotten better as well. Never vomited. Is having lower abdominal gas with audible sounds. Typical heartburn symptoms are well controlled and keeps her bed elevated and does not eat several hours prior to going to bed. Has tried pantoprazole, lansoprazole in the past. Does not believe she has tried omeprazole.   Having regular bowel movements without issues.    Medications Current Meds  Medication Sig   acetaminophen (TYLENOL)  650 MG CR tablet Take 650-1,300 mg by mouth daily as needed for pain.   amiodarone (PACERONE) 200 MG tablet Take 1 tablet (200 mg total) by mouth daily.   apixaban (ELIQUIS) 5 MG TABS tablet Take 1 tablet (5 mg total) by mouth 2 (two) times  daily.   cetirizine (ZYRTEC) 10 MG tablet Take 10 mg by mouth daily.   Chlorpheniramine Maleate (CHLOR-TABLETS PO) Take 2 tablets by mouth daily as needed (allergies).   docusate sodium (COLACE) 100 MG capsule Take 200 mg by mouth every morning.   fluticasone (FLONASE) 50 MCG/ACT nasal spray Place 1 spray into both nostrils daily as needed for allergies or rhinitis.   furosemide (LASIX) 20 MG tablet Take 2 tablets (40 mg total) by mouth daily as needed for fluid.   levothyroxine (SYNTHROID, LEVOTHROID) 125 MCG tablet Take 125 mcg by mouth daily before breakfast.   lidocaine (XYLOCAINE) 2 % solution TAKE 15ML BY MOUTH AS DIRECTED EVERY 6 HOURS AS NEEDED FOR MOUTH PAIN   metoprolol tartrate (LOPRESSOR) 25 MG tablet Take 1 tablet (25 mg total) by mouth 2 (two) times daily.   Polyethyl Glycol-Propyl Glycol (SYSTANE OP) Place 1-2 drops into both eyes daily.   promethazine (PHENERGAN) 25 MG tablet Take 1 tablet (25 mg total) by mouth every 6 (six) hours as needed for nausea or vomiting.   RABEprazole (ACIPHEX) 20 MG tablet Take 1 tablet (20 mg total) by mouth 2 (two) times daily before a meal.   sucralfate (CARAFATE) 1 GM/10ML suspension Take 10 mLs (1 g total) by mouth 4 (four) times daily -  with meals and at bedtime. As needed for abdominal burning/pain.     History Past Medical History:  Diagnosis Date   Anemia    Arthritis    Asthma    Atrial fibrillation ([place])    CLL (chronic lymphocytic leukemia) ([place])    Dysrhythmia    Essential hypertension    GERD (gastroesophageal reflux disease)    History of hiatal hernia    Hyperlipidemia    Paroxysmal atrial fibrillation ([facility])    Splenomegaly     Past Surgical History:  Procedure Laterality Date   ANKLE SURGERY Right    repair fracture   BIOPSY  02/28/2022   Procedure: BIOPSY;  Surgeon: Daneil Dolin, MD;  [facility];  Service: Endoscopy;;  gastric   CARDIOVERSION N/A 03/20/2016   Procedure: CARDIOVERSION;  Surgeon: Satira Sark, MD;  Location: [facility];  Service: Cardiovascular;  Laterality: N/A;   CARDIOVERSION N/A 05/22/2016   Procedure: CARDIOVERSION;  Surgeon: Satira Sark, MD;  Location: [facility];  Service: Cardiovascular;  Laterality: N/A;   CARDIOVERSION N/A 11/20/2021   Procedure: CARDIOVERSION;  Surgeon: Werner Lean, MD;  Location: [facility];  Service: Cardiovascular;  Laterality: N/A;   CARDIOVERSION N/A 11/24/2021   Procedure: CARDIOVERSION;  Surgeon: Satira Sark, MD;  Location: [facility];  Service: Cardiovascular;  Laterality: N/A;   CHOLECYSTECTOMY     COLONOSCOPY WITH PROPOFOL N/A 03/27/2018   Internal hemorrhoids, diverticulosis in sigmoid and descending colon, three 4-6 mm polyps at IC valve. tubular adenomas 5 year surveillance   ESOPHAGOGASTRODUODENOSCOPY (EGD) WITH PROPOFOL N/A 03/27/2018   Moderate Schatzki's ring s/p dilation, small hiatal hernia   ESOPHAGOGASTRODUODENOSCOPY (EGD) WITH PROPOFOL N/A 02/28/2022   Procedure: ESOPHAGOGASTRODUODENOSCOPY (EGD) WITH PROPOFOL;  Surgeon: Daneil Dolin, MD;  [facility];  Service: Endoscopy;  Laterality: N/A;  8:15am   HERNIA REPAIR  2011   Incisional  and umbilical utilizing mesh   MALONEY DILATION N/A 03/27/2018   Procedure: Venia Minks DILATION;  Surgeon: Daneil Dolin, MD;  [facility];  Service: Endoscopy;  Laterality: N/A;   MALONEY DILATION N/A 02/28/2022   Procedure: Venia Minks DILATION;  Surgeon: Daneil Dolin, MD;  [facility];  Service: Endoscopy;  Laterality: N/A;   POLYPECTOMY  03/27/2018   Procedure: POLYPECTOMY;  Surgeon: Daneil Dolin, MD;  [facility];  Service: Endoscopy;;  colon   ROTATOR CUFF REPAIR Left    TEE WITHOUT CARDIOVERSION N/A 01/17/2016   Procedure: TRANSESOPHAGEAL ECHOCARDIOGRAM (TEE);  Surgeon: Herminio Commons, MD;  Location: [facility];  Service: Cardiovascular;  Laterality: N/A;   TEE WITHOUT CARDIOVERSION N/A 02/21/2016   Procedure: TRANSESOPHAGEAL  ECHOCARDIOGRAM (TEE) WITH PROPOFOL;  Surgeon: Herminio Commons, MD;  [facility]: [facility];  Service: Cardiovascular;  Laterality: N/A;   TEE WITHOUT CARDIOVERSION N/A 03/20/2016   Procedure: TRANSESOPHAGEAL ECHOCARDIOGRAM (TEE) WITH PROPOFOL;  Surgeon: Satira Sark, MD;  Location: [facility];  Service: Cardiovascular;  Laterality: N/A;   TONSILLECTOMY     TOTAL KNEE ARTHROPLASTY  2022    Family History  Problem Relation Age of Onset   Ovarian cancer Mother 65       Secondary to ovarian cancer   Leukemia Mother    Stroke Father 73       Brain stem infarction   Breast cancer Paternal Aunt    Crohn's disease Son    Colon cancer Neg Hx     Social History   Socioeconomic History   Marital status: Married    Spouse name: Not on file   Number of children: 9   Years of education: Not on file   Highest education level: Not on file  Occupational History   Not on file  Tobacco Use   Smoking status: Former    Packs/day: 0.25    Years: 2.00    Total pack years: 0.50    Types: Cigarettes    Quit date: 08/03/1975    Years since quitting: 46.8   Smokeless tobacco: Never  Vaping Use   Vaping Use: Never used  Substance and Sexual Activity   Alcohol use: No    Alcohol/week: 0.0 standard drinks of alcohol   Drug use: No   Sexual activity: Yes    Birth control/protection: Post-menopausal  Other Topics Concern   Not on file  Social History Narrative   Not on file   Social Determinants of Health   Financial Resource Strain: Not on file  Food Insecurity: Not on file  Transportation Needs: Not on file  Physical Activity: Not on file  Stress: Not on file  Social Connections: Not on file      Review of Systems: Gen: Denies fever, chills, anorexia. Denies fatigue, weakness, weight loss.  CV: Denies chest pain, palpitations, syncope, peripheral edema, and claudication. Resp: Denies dyspnea at rest, cough, wheezing, coughing up blood, and pleurisy. GI: see HPI Derm: Denies rash,  itching, dry skin Psych: Denies depression, anxiety, memory loss, confusion. No homicidal or suicidal ideation.  Heme: Denies bruising, bleeding, and enlarged lymph nodes.  Observations/Objective: No distress. Alert and oriented. Pleasant. Well nourished. Normal mood and affect. Unable to perform complete physical exam due to video encounter.   Assessment:  GERD/epigastric pain/gassiness: Stable heartburn/indigestion controlled with Aciphex 20 mg twice daily however she continues to have recurrent epigastric pain with burning.  Last EGD in August 2023 with gastric erosions and reactive changes.  She previously been on pantoprazole.  Suspect largely that her recurrent symptoms are due to dietary factors such as frequently consuming acidic foods, most recently exacerbated by days of vegetable soup made with onions and tomato juice.  Given that her symptoms have never seem to be greatly controlled with Aciphex we will trial omeprazole 40 mg twice daily instead.  Has not been consistently using Carafate since her last visit therefore I have encouraged her to use this 4 times a day for 2 weeks and then stop.  Discussed strict GERD diet.  Given that she is having some gassiness despite regular bowel movements she may also use Beano, Phazyme, Gas-X as needed.  We will continue to monitor and she will contact the office if her symptoms worsen.   Plan:  Stop aciphex and start omeprazole 40 mg BID.  Take Carafate 1g QID for 2 weeks consistently and then stop Strict GERD diet.  Avoid gas producing foods, including lactose/dairy products May take beano, Phazyme or Gas-X as needed Follow-up in 3 months    Follow Up Instructions:  I discussed the assessment and treatment plan with the patient. The patient was provided an opportunity to ask questions and all were answered. The patient agreed with the plan and demonstrated an understanding of the instructions.   The patient was advised to call back or  seek an in-person evaluation if the symptoms worsen or if the condition fails to improve as anticipated.    Venetia Night, MSN, APRN, FNP-BC, AGACNP-BC [facility]

## 2022-06-13 NOTE — Patient Instructions (Addendum)
I want you to stop taking Aciphex and start omeprazole 40 mg twice daily, 30 minutes prior to breakfast and dinner.  Please continue taking the Carafate 1 g 3 times daily before meals, and at bedtime.  Please be consistent with this for at least 2 weeks and then stop the medication.  Follow a strict GERD diet:  Avoid fried, fatty, greasy, spicy, citrus foods. (This includes anything that is tomato based or very acidic in nature).  Red meats are common fatty foods that should be minimized during this time period where you are having increased pain. Avoid caffeine and carbonated beverages. Avoid chocolate. Try eating 4-6 small meals a day rather than 3 large meals. Do not eat within 3 hours of laying down. Prop head of bed up on wood or bricks to create a 6 inch incline.  Avoid high gas-producing foods (eg, cabbage, legumes, onions, broccoli, brussel sprouts, wheat, and potatoes).  This can include dairy products as well.  Limit your consumption of milk and cheese.  Some yogurts without any added sugars or extra ingredients may be okay.  Some people can experience increased gas production with these as well and if you have worsening symptoms then stop.  Lactaid is an over-the-counter medication that you can take prior to consuming dairy products that will help with digestion and could try to limit any excess gas.  You may continue your over-the-counter probiotic to see if this helps.  You may use over-the-counter Gas-X, Beano, or Phazyme as needed for relief of increased gas.  We will have you follow-up in about 3 months, or sooner if needed.  It was a pleasure to see you today. I want to create trusting relationships with patients. If you receive a survey regarding your visit,  I greatly appreciate you taking time to fill this out on paper or through your MyChart. I value your feedback.  Venetia Night, MSN, FNP-BC, AGACNP-BC [facility]

## 2022-07-02 ENCOUNTER — Other Ambulatory Visit: Payer: Self-pay | Admitting: Cardiology

## 2022-07-05 ENCOUNTER — Telehealth (INDEPENDENT_AMBULATORY_CARE_PROVIDER_SITE_OTHER): Payer: Self-pay

## 2022-07-05 NOTE — Telephone Encounter (Signed)
Patient called the office on 07/04/2022 at 8:44 am, and left a message on office voice mail that she mail that she may be having a diverticulitis flare to please call her. I have called the patient and left a message on her voice mail that if she still needed assistance to please call us back at [facility] gastro at [place].

## 2022-07-06 NOTE — Telephone Encounter (Signed)
Tammy Please see messages below. Thanks

## 2022-07-06 NOTE — Telephone Encounter (Signed)
I called and left a message again on the patient vm, asked that the patient please return call to either office,I did leave my name and my phone number for the patient to reach out to [place] today by 11:30 am when the office closes, if not and still having issues I advised that she please reach her pcp.

## 2022-07-09 NOTE — Telephone Encounter (Signed)
Lmom instructing the pt to call and schedule a follow up appt if still having issues.

## 2022-07-11 ENCOUNTER — Telehealth: Payer: Self-pay | Admitting: *Deleted

## 2022-07-11 NOTE — Telephone Encounter (Signed)
Pt called and states she was having stomach pains, but she thinks it was from the antibiotic that she was on for the sinus infection. She states she still has 3 days of Cipro to take and was wondering if she should continue taking it. Informed her to call the provider that prescribe the Cipro and see what they say.

## 2022-07-12 NOTE — Telephone Encounter (Signed)
Noted  

## 2022-07-14 ENCOUNTER — Emergency Department (HOSPITAL_COMMUNITY)
Admission: EM | Admit: 2022-07-14 | Discharge: 2022-07-14 | Disposition: A | Discharge status: Home / Self Care | Payer: Medicare Other | Attending: Emergency Medicine | Admitting: Emergency Medicine

## 2022-07-14 ENCOUNTER — Other Ambulatory Visit: Payer: Self-pay

## 2022-07-14 ENCOUNTER — Emergency Department (HOSPITAL_COMMUNITY): Payer: Medicare Other

## 2022-07-14 ENCOUNTER — Encounter (HOSPITAL_COMMUNITY): Payer: Self-pay | Admitting: Emergency Medicine

## 2022-07-14 DIAGNOSIS — R630 Anorexia: Secondary | ICD-10-CM | POA: Diagnosis not present

## 2022-07-14 DIAGNOSIS — R109 Unspecified abdominal pain: Secondary | ICD-10-CM | POA: Diagnosis present

## 2022-07-14 DIAGNOSIS — R110 Nausea: Secondary | ICD-10-CM | POA: Diagnosis not present

## 2022-07-14 DIAGNOSIS — Z7901 Long term (current) use of anticoagulants: Secondary | ICD-10-CM | POA: Insufficient documentation

## 2022-07-14 DIAGNOSIS — R1084 Generalized abdominal pain: Secondary | ICD-10-CM | POA: Diagnosis not present

## 2022-07-14 LAB — URINALYSIS, ROUTINE W REFLEX MICROSCOPIC
Bilirubin Urine: NEGATIVE
Glucose, UA: NEGATIVE mg/dL
Hgb urine dipstick: NEGATIVE
Ketones, ur: NEGATIVE mg/dL
Leukocytes,Ua: NEGATIVE
Nitrite: NEGATIVE
Protein, ur: NEGATIVE mg/dL
Specific Gravity, Urine: 1.017 (ref 1.005–1.030)
pH: 5 (ref 5.0–8.0)

## 2022-07-14 LAB — COMPREHENSIVE METABOLIC PANEL
ALT: 18 U/L (ref 0–44)
AST: 20 U/L (ref 15–41)
Albumin: 4 g/dL (ref 3.5–5.0)
Alkaline Phosphatase: 104 U/L (ref 38–126)
Anion gap: 5 (ref 5–15)
BUN: 12 mg/dL (ref 8–23)
CO2: 29 mmol/L (ref 22–32)
Calcium: 10.1 mg/dL (ref 8.9–10.3)
Chloride: 109 mmol/L (ref 98–111)
Creatinine, Ser: 0.9 mg/dL (ref 0.44–1.00)
GFR, Estimated: >60 mL/min (ref >60)
Glucose, Bld: 90 mg/dL (ref 70–99)
Potassium: 3.9 mmol/L (ref 3.5–5.1)
Sodium: 143 mmol/L (ref 135–145)
Total Bilirubin: 0.7 mg/dL (ref 0.3–1.2)
Total Protein: 6.5 g/dL (ref 6.5–8.1)

## 2022-07-14 LAB — LIPASE, BLOOD: Lipase: 37 U/L (ref 11–51)

## 2022-07-14 LAB — CBC
HCT: 44.4 % (ref 36.0–46.0)
Hemoglobin: 13.6 g/dL (ref 12.0–15.0)
MCH: 29.5 pg (ref 26.0–34.0)
MCHC: 30.6 g/dL (ref 30.0–36.0)
MCV: 96.3 fL (ref 80.0–100.0)
Platelets: 106 10*3/uL — ABNORMAL LOW (ref 150–400)
RBC: 4.61 MIL/uL (ref 3.87–5.11)
RDW: 16.2 % — ABNORMAL HIGH (ref 11.5–15.5)
WBC: 25.5 10*3/uL — ABNORMAL HIGH (ref 4.0–10.5)
nRBC: 0 % (ref 0.0–0.2)

## 2022-07-14 MED ORDER — TRAMADOL HCL 50 MG PO TABS
50.0000 mg | ORAL_TABLET | Freq: Once | ORAL | Status: AC
  Administered 2022-07-14: 50 mg via ORAL
  Filled 2022-07-14: qty 1

## 2022-07-14 MED ORDER — ONDANSETRON HCL 4 MG/2ML IJ SOLN
4.0000 mg | Freq: Once | INTRAMUSCULAR | Status: AC
  Administered 2022-07-14: 4 mg via INTRAVENOUS
  Filled 2022-07-14: qty 2

## 2022-07-14 MED ORDER — SODIUM CHLORIDE 0.9 % IV BOLUS
1000.0000 mL | Freq: Once | INTRAVENOUS | Status: AC
  Administered 2022-07-14: 1000 mL via INTRAVENOUS

## 2022-07-14 MED ORDER — SUCRALFATE 1 GM/10ML PO SUSP: 1.0000 g | Freq: Three times a day (TID) | ORAL | 0 refills | Status: DC

## 2022-07-14 MED ORDER — IOHEXOL 300 MG/ML  SOLN
100.0000 mL | Freq: Once | INTRAMUSCULAR | Status: AC | PRN
  Administered 2022-07-14: 100 mL via INTRAVENOUS

## 2022-07-14 MED ORDER — TRAMADOL HCL 50 MG PO TABS: 50.0000 mg | ORAL_TABLET | Freq: Four times a day (QID) | ORAL | 0 refills | Status: DC | PRN

## 2022-07-14 MED ORDER — MORPHINE SULFATE (PF) 4 MG/ML IV SOLN
4.0000 mg | Freq: Once | INTRAVENOUS | Status: AC
  Administered 2022-07-14: 4 mg via INTRAVENOUS
  Filled 2022-07-14: qty 1

## 2022-07-14 NOTE — ED Triage Notes (Signed)
Pt c/o abd pain x 1.5 weeks. Some diarrhea and nausea.

## 2022-07-14 NOTE — Discharge Instructions (Addendum)
As discussed, your evaluation today has been largely reassuring.  But, it is important that you monitor your condition carefully, and do not hesitate to return to the ED if you develop new, or concerning changes in your condition.  You are starting any medication for pain control.  Do not drive while taking this medication.  Otherwise, please follow-up with your physician for appropriate ongoing care.

## 2022-07-14 NOTE — ED Provider Notes (Signed)
[facility] EMERGENCY DEPARTMENT Provider Note   CSN: [id number] Arrival date & time: 07/14/22  7371     History  Chief Complaint  Patient presents with   Abdominal Pain    Daisy Black is a 71 y.o. female.  HPI Patient presents with her husband who assists with the history.  Patient has a history of diverticulitis.  Last colonoscopy was 1 year ago.  About 2 weeks ago she developed abdominal pain, nausea, anorexia.  She is seen, started antibiotics by her GI clinic, though she has not seen her physician.  Today's last day of her antibiotics and she notes that she has persistent pain in her mid abdomen, the left lower quadrant pain which to her was typical for her diverticulitis has resolved.  There is continued associated anorexia, nausea, but no diarrhea.  She states that she is hesitant to take pain medication, but is here for pain control.    Home Medications Prior to Admission medications   Medication Sig Start Date End Date Taking? Authorizing Provider  traMADol (ULTRAM) 50 MG tablet Take 1 tablet (50 mg total) by mouth every 6 (six) hours as needed for severe pain. 07/14/22  Yes Carmin Muskrat, MD  acetaminophen (TYLENOL) 650 MG CR tablet Take 650-1,300 mg by mouth daily as needed for pain.    [provider]  amiodarone (PACERONE) 200 MG tablet Take 1 tablet (200 mg total) by mouth daily. 12/01/21 08/28/22  Strader, Fransisco Hertz, PA-C  apixaban (ELIQUIS) 5 MG TABS tablet Take 1 tablet (5 mg total) by mouth 2 (two) times daily. 12/01/21   Strader, Fransisco Hertz, PA-C  cetirizine (ZYRTEC) 10 MG tablet Take 10 mg by mouth daily.    [provider]  Chlorpheniramine Maleate (CHLOR-TABLETS PO) Take 2 tablets by mouth daily as needed (allergies).    [provider]  docusate sodium (COLACE) 100 MG capsule Take 200 mg by mouth every morning.    [provider]  fluticasone (FLONASE) 50 MCG/ACT nasal spray Place 1 spray into both nostrils daily as needed  for allergies or rhinitis.    [provider]  furosemide (LASIX) 20 MG tablet Take 2 tablets (40 mg total) by mouth daily as needed for fluid. 12/19/21   Satira Sark, MD  levothyroxine (SYNTHROID, LEVOTHROID) 125 MCG tablet Take 125 mcg by mouth daily before breakfast.    [provider]  lidocaine (XYLOCAINE) 2 % solution TAKE 15ML BY MOUTH AS DIRECTED EVERY 6 HOURS AS NEEDED FOR MOUTH PAIN 12/18/21   Annitta Needs, NP  metoprolol tartrate (LOPRESSOR) 25 MG tablet TAKE ONE TABLET BY MOUTH TWO TIMES A DAY 07/02/22   Strader, [place] M, PA-C  omeprazole (PRILOSEC) 40 MG capsule Take 1 capsule (40 mg total) by mouth in the morning and at bedtime. 06/13/22   Sherron Monday, NP  Polyethyl Glycol-Propyl Glycol (SYSTANE OP) Place 1-2 drops into both eyes daily.    [provider]  promethazine (PHENERGAN) 25 MG tablet Take 1 tablet (25 mg total) by mouth every 6 (six) hours as needed for nausea or vomiting. 05/10/22   Lloyd Huger, MD  sucralfate (CARAFATE) 1 GM/10ML suspension Take 10 mLs (1 g total) by mouth 4 (four) times daily -  with meals and at bedtime. As needed for abdominal burning/pain. 04/11/22 06/13/22  Mahala Menghini, PA-C      Allergies    Flagyl [metronidazole]    Review of Systems   Review of Systems  All other  systems reviewed and are negative.   Physical Exam Updated Vital Signs BP (!) 114/58 (BP Location: Left Arm)   Pulse (!) 52   Temp 97.7 F (36.5 C) (Oral)   Resp 16   Ht '5\' 7"'$  (1.702 m)   Wt 103.9 kg   SpO2 100%   BMI 35.87 kg/m  Physical Exam Vitals and nursing note reviewed.  Constitutional:      General: She is not in acute distress.    Appearance: She is well-developed. She is obese.  HENT:     Head: Normocephalic and atraumatic.  Eyes:     Conjunctiva/sclera: Conjunctivae normal.  Cardiovascular:     Rate and Rhythm: Normal rate and regular rhythm.  Pulmonary:     Effort: Pulmonary effort is normal. No  respiratory distress.     Breath sounds: Normal breath sounds. No stridor.  Abdominal:     General: There is no distension.     Tenderness: There is abdominal tenderness in the epigastric area and periumbilical area.  Skin:    General: Skin is warm and dry.  Neurological:     Mental Status: She is alert and oriented to person, place, and time.     Cranial Nerves: No cranial nerve deficit.  Psychiatric:        Mood and Affect: Mood normal.     ED Results / Procedures / Treatments   Labs (all labs ordered are listed, but only abnormal results are displayed) Labs Reviewed  CBC - Abnormal; Notable for the following components:      Result Value   WBC 25.5 (*)    RDW 16.2 (*)    Platelets 106 (*)    All other components within normal limits  LIPASE, BLOOD  COMPREHENSIVE METABOLIC PANEL  URINALYSIS, ROUTINE W REFLEX MICROSCOPIC    EKG EKG Interpretation  Date/Time:  Saturday July 14 2022 07:31:04 EST Ventricular Rate:  50 PR Interval:  192 QRS Duration: 86 QT Interval:  430 QTC Calculation: 392 R Axis:   9 Text Interpretation: Sinus bradycardia with sinus arrhythmia Minimal voltage criteria for LVH, may be normal variant ( R in aVL ) Cannot rule out Anterior infarct , age undetermined Abnormal ECG When compared with ECG of 24-Nov-2021 13:05, No significant change was found Confirmed by Carmin Muskrat [phone]) on 07/14/2022 7:51:49 AM  Radiology CT Abdomen Pelvis W Contrast  Result Date: 07/14/2022 CLINICAL DATA:  Abdominal pain, diarrhea, evaluate for diverticulitis EXAM: CT ABDOMEN AND PELVIS WITH CONTRAST TECHNIQUE: Multidetector CT imaging of the abdomen and pelvis was performed using the standard protocol following bolus administration of intravenous contrast. RADIATION DOSE REDUCTION: This exam was performed according to the departmental dose-optimization program which includes automated exposure control, adjustment of the mA and/or kV according to patient size and/or  use of iterative reconstruction technique. CONTRAST:  114m OMNIPAQUE IOHEXOL 300 MG/ML  SOLN COMPARISON:  05/08/2022 FINDINGS: Lower chest: Visualized lower lung fields are clear. There are enlarged lymph nodes in the visualized inferior aspect of right axilla measuring up to 2.3 cm in short axis measurement. Hepatobiliary: No focal abnormalities are seen in liver. There is no dilation of bile ducts. Gallbladder is not seen. Pancreas: No focal abnormalities are seen. Spleen: Spleen measures 17.3 cm in maximum diameter. Adrenals/Urinary Tract: Adrenals appear stable. There is no hydronephrosis. There are no renal or ureteral stones. Possible subcentimeter cysts are seen in the kidneys. There are no renal or ureteral stones. Urinary bladder is not distended. Stomach/Bowel: Stomach is unremarkable. Small bowel  loops are not dilated. Appendix is not dilated. Multiple diverticula seen in colon. There is no evidence of focal acute diverticulitis. Vascular/Lymphatic: Scattered tiny vascular calcifications are seen. There are enlarged lymph nodes in mesentery and retroperitoneum. There are enlarged lymph nodes along the lesser curvature of stomach measuring up to 1.2 cm in short axis. There is a portacaval node measuring 1.2 cm in short axis. There are para-aortic nodes measuring up to 1.4 cm in short axis. There are iliac nodes measuring up to 1.5 cm in short axis. There are enlarged lymph nodes in both inguinal regions measuring up to 1.2 cm in short axis. There is slight decrease in size of enlarged lymph nodes in abdomen and pelvis. Reproductive: Unremarkable. Other: There is no ascites or pneumoperitoneum. There is diastasis between the rectus muscles. Small right inguinal hernia containing fat is seen. Musculoskeletal: No acute findings are seen in bony structures. IMPRESSION: There is no evidence of intestinal obstruction or pneumoperitoneum. There is no hydronephrosis. Appendix is not dilated. Diverticulosis of  colon without signs of focal acute diverticulitis. Enlarged spleen. There are enlarged lymph nodes in right axilla, abdomen and pelvis consistent with history of lymphoma. There is slight interval decrease in size of the lymph nodes. Electronically Signed   By: Elmer Picker M.D.   On: 07/14/2022 10:16    Procedures Procedures    Medications Ordered in ED Medications  traMADol (ULTRAM) tablet 50 mg (has no administration in time range)  sodium chloride 0.9 % bolus 1,000 mL (1,000 mLs Intravenous New Bag/Given 07/14/22 0919)  ondansetron (ZOFRAN) injection 4 mg (4 mg Intravenous Given 07/14/22 0919)  morphine (PF) 4 MG/ML injection 4 mg (4 mg Intravenous Given 07/14/22 0919)  iohexol (OMNIPAQUE) 300 MG/ML solution 100 mL (100 mLs Intravenous Contrast Given 07/14/22 0936)    ED Course/ Medical Decision Making/ A&P This patient with a Hx of obesity, hypertension, diverticulitis presents to the ED for concern of abdominal pain, nausea, anorexia, this involves an extensive number of treatment options, and is a complaint that carries with it a high risk of complications and morbidity.    The differential diagnosis includes diverticulitis, colitis, mesenteric adenitis   Social Determinants of Health:  Obesity, age  Additional history obtained:  Additional history and/or information obtained from husband at bedside, notable for HPI   After the initial evaluation, orders, including: Labs CT IV fentanyl, Zofran, fluids were initiated.   Patient placed on Cardiac and Pulse-Oximetry Monitors. The patient was maintained on a cardiac monitor.  The cardiac monitored showed an rhythm of 60 sinus normal The patient was also maintained on pulse oximetry. The readings were typically 100% room air normal   On repeat evaluation of the patient improved  Lab Tests:  I personally interpreted labs.  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  We discussed all findings at length, including CT, labs.  No evidence for bacteremia, sepsis, diverticulitis. Some suspicion for the patient's chronic conditions and/or resolving diverticulitis contributing to her pain.  Without hemodynamic instability, with generally consistent, reassuring labs as above, the hospitalization was a  consideration given her age, she is appropriate for discharge to follow-up with her gastroenterologist in 2 days.  She requests pain meds on discharge, this was facilitated.  Final Clinical Impression(s) / ED Diagnoses Final diagnoses:  Generalized abdominal pain    Rx / DC Orders ED Discharge Orders          Ordered    traMADol (ULTRAM) 50 MG tablet  Every 6 hours PRN        07/14/22 1054              Carmin Muskrat, MD 07/14/22 1055

## 2022-07-16 ENCOUNTER — Telehealth: Payer: Self-pay | Admitting: Internal Medicine

## 2022-07-16 NOTE — Telephone Encounter (Signed)
Spoke to pt, she informed me that she went to ED on 12/16 and had a CT done and would like provider to look at it. Informed me that she is not in that much pain, but would like an OV. I informed her that it maybe the first week in January before we could get her in. She said that would be fine.

## 2022-07-16 NOTE — Telephone Encounter (Signed)
Pt aware of OV on 1/29 at 11am with Venetia Night, NP

## 2022-07-16 NOTE — Telephone Encounter (Signed)
Pt was seen in the ER for abd pain and called to make OV. Daisy Night, NP just had OV with her on 06/13/2022. Please advise if she needs another OV? [phone]

## 2022-08-14 ENCOUNTER — Other Ambulatory Visit: Payer: Self-pay | Admitting: *Deleted

## 2022-08-14 ENCOUNTER — Encounter: Payer: Self-pay | Admitting: Cardiology

## 2022-08-14 ENCOUNTER — Ambulatory Visit: Payer: Medicare Other | Attending: Cardiology | Admitting: Cardiology

## 2022-08-14 VITALS — BP 122/64 | HR 70 | Ht 68.0 in | Wt 237.0 lb

## 2022-08-14 DIAGNOSIS — I480 Paroxysmal atrial fibrillation: Secondary | ICD-10-CM

## 2022-08-14 DIAGNOSIS — Z79899 Other long term (current) drug therapy: Secondary | ICD-10-CM

## 2022-08-14 NOTE — Progress Notes (Signed)
Cardiology Office Note  Date: 08/14/2022   ID: Daisy, Black 1951-02-22, MRN [id number]  PCP:  Ludwig Clarks, FNP  Cardiologist:  Rozann Lesches, MD Electrophysiologist:  None   Chief Complaint  Patient presents with   Cardiac follow-up    History of Present Illness: Daisy Black is a 72 y.o. female last seen in July 2023.  She is here for a routine visit.  Overall doing well on amiodarone in terms of rhythm suppression.  She continues on Eliquis for stroke prophylaxis, no spontaneous bleeding problems reported.  I personally reviewed her ECG today which shows sinus rhythm with prolonged PR interval.  She had LFTs done recently in December 2023 which were normal.  Plan to add a TSH at this time.  She does have a history of hypothyroidism and is on Synthroid.  Past Medical History:  Diagnosis Date   Anemia    Arthritis    Asthma    Atrial fibrillation (HCC)    CLL (chronic lymphocytic leukemia) (HCC)    Essential hypertension    GERD (gastroesophageal reflux disease)    History of hiatal hernia    Hyperlipidemia    Paroxysmal atrial fibrillation (HCC)    Splenomegaly     Current Outpatient Medications  Medication Sig Dispense Refill   acetaminophen (TYLENOL) 650 MG CR tablet Take 650-1,300 mg by mouth daily as needed for pain.     albuterol (VENTOLIN HFA) 108 (90 Base) MCG/ACT inhaler INHALE 1 PUFF BY MOUTH AS NEEDED INHALATION EVERY 4 HRS     amiodarone (PACERONE) 200 MG tablet Take 1 tablet (200 mg total) by mouth daily. 90 tablet 2   apixaban (ELIQUIS) 5 MG TABS tablet Take 1 tablet (5 mg total) by mouth 2 (two) times daily. 180 tablet 3   cetirizine (ZYRTEC) 10 MG tablet Take 10 mg by mouth daily.     Chlorpheniramine Maleate (CHLOR-TABLETS PO) Take 2 tablets by mouth daily as needed (allergies).     docusate sodium (COLACE) 100 MG capsule Take 200 mg by mouth every morning.     fluticasone (FLONASE) 50 MCG/ACT nasal spray Place 1 spray into both  nostrils daily as needed for allergies or rhinitis.     furosemide (LASIX) 20 MG tablet Take 2 tablets (40 mg total) by mouth daily as needed for fluid. 180 tablet 2   levothyroxine (SYNTHROID, LEVOTHROID) 125 MCG tablet Take 125 mcg by mouth daily before breakfast.     lidocaine (XYLOCAINE) 2 % solution TAKE 15ML BY MOUTH AS DIRECTED EVERY 6 HOURS AS NEEDED FOR MOUTH PAIN 100 mL 2   metoprolol tartrate (LOPRESSOR) 25 MG tablet TAKE ONE TABLET BY MOUTH TWO TIMES A DAY 180 tablet 1   omeprazole (PRILOSEC) 40 MG capsule Take 1 capsule (40 mg total) by mouth in the morning and at bedtime. 90 capsule 3   Polyethyl Glycol-Propyl Glycol (SYSTANE OP) Place 1-2 drops into both eyes daily.     promethazine (PHENERGAN) 25 MG tablet Take 1 tablet (25 mg total) by mouth every 6 (six) hours as needed for nausea or vomiting. 60 tablet 1   sucralfate (CARAFATE) 1 GM/10ML suspension Take 10 mLs (1 g total) by mouth 4 (four) times daily -  with meals and at bedtime. Please use three times daily for the next five days. 420 mL 0   traMADol (ULTRAM) 50 MG tablet Take 1 tablet (50 mg total) by mouth every 6 (six) hours as needed for severe pain. 10 tablet  0   No current facility-administered medications for this visit.   Facility-Administered Medications Ordered in Other Visits  Medication Dose Route Frequency Provider Last Rate Last Admin   hydrocortisone cream 1 % 1 application  1 application  Topical TID PRN Satira Sark, MD       Allergies:  Flagyl [metronidazole]   ROS: No orthopnea or PND.  Physical Exam: VS:  BP 122/64   Pulse 70   Ht '5\' 8"'$  (1.727 m)   Wt 237 lb (107.5 kg)   SpO2 98%   BMI 36.04 kg/m , BMI Body mass index is 36.04 kg/m.  Wt Readings from Last 3 Encounters:  08/14/22 237 lb (107.5 kg)  07/14/22 229 lb (103.9 kg)  06/13/22 236 lb (107 kg)    General: Patient appears comfortable at rest. HEENT: Conjunctiva and lids normal. Lungs: Clear to auscultation, nonlabored breathing  at rest. Cardiac: Regular rate and rhythm, no S3 or significant systolic murmur.  ECG:  An ECG dated 07/14/2022 was personally reviewed today and demonstrated:  Sinus bradycardia with PACs and increased voltage.  Recent Labwork: 11/17/2021: TSH 1.987 11/18/2021: Magnesium 2.2 07/14/2022: ALT 18; AST 20; BUN 12; Creatinine, Ser 0.90; Hemoglobin 13.6; Platelets 106; Potassium 3.9; Sodium 143   Other Studies Reviewed Today:  Echocardiogram 05/15/2022:  1. Left ventricular ejection fraction, by estimation, is 65 to 70%. The  left ventricle has normal function. The left ventricle has no regional  wall motion abnormalities. Left ventricular diastolic parameters were  normal. The average left ventricular  global longitudinal strain is -24.0 %. The global longitudinal strain is  normal.   2. Right ventricular systolic function is normal. The right ventricular  size is normal.   3. The mitral valve is normal in structure. No evidence of mitral valve  regurgitation. No evidence of mitral stenosis.   4. The aortic valve is tricuspid. Aortic valve regurgitation is not  visualized. No aortic stenosis is present.   5. The inferior vena cava is normal in size with greater than 50%  respiratory variability, suggesting right atrial pressure of 3 mmHg.   Assessment and Plan:  1.  Paroxysmal to persistent atrial fibrillation with CHA2DS2-VASc score of 3.  She is symptomatically stable at this time on amiodarone, Lopressor, and Eliquis.  ECG reviewed today.  Recent LFTs normal, check TSH.  2.  Known hypothyroidism, currently on Synthroid.  TSH was normal in April of last year.  Medication Adjustments/Labs and Tests Ordered: Current medicines are reviewed at length with the patient today.  Concerns regarding medicines are outlined above.   Tests Ordered: Orders Placed This Encounter  Procedures   TSH   EKG 12-Lead    Medication Changes: No orders of the defined types were placed in this  encounter.   Disposition:  Follow up  6 months.  Signed, Satira Sark, MD, [facility] 08/14/2022 4:02 PM    [facility] [place], [place], [place] [address] Phone: [phone]; Fax: [phone]

## 2022-08-14 NOTE — Patient Instructions (Addendum)
Medication Instructions:  Your physician recommends that you continue on your current medications as directed. Please refer to the Current Medication list given to you today.  Labwork: TSH  Testing/Procedures: none  Follow-Up: Your physician recommends that you schedule a follow-up appointment in: 6 months  Any Other Special Instructions Will Be Listed Below (If Applicable).  If you need a refill on your cardiac medications before your next appointment, please call your pharmacy.

## 2022-08-23 ENCOUNTER — Ambulatory Visit: Payer: Medicare Other | Admitting: Nurse Practitioner

## 2022-08-23 ENCOUNTER — Encounter: Payer: Self-pay | Admitting: Cardiology

## 2022-08-23 NOTE — Progress Notes (Deleted)
Cardiology Office Note:    Date:  08/23/2022   ID:  Daisy Black, Daisy Black 08/04/1950, MRN [id number]  PCP:  Ludwig Clarks, [place] Prov[facility] Cardiologist:  Rozann Lesches, MD { Click to update primary MD,subspecialty MD or APP then REFRESH:1}    Referring MD: Ludwig Clarks, FNP   No chief complaint on file. ***  History of Present Illness:    Daisy Black is a 72 y.o. female with a hx of ***  Paroxysmal A-fib  Hypertension Hyperlipidemia CLL, anemia Asthma Hypothyroidism  Patient is a 72 year old female with past medical history as mentioned above.  Last seen by Dr. Domenic Polite on August 14, 2022.  She was doing overall well on amiodarone.  Denied any issues while on Eliquis.  EKG in office revealed sinus rhythm with prolonged PR interval.  No medication changes were made.  TSH was arranged.  Was told to follow-up in 6 months.  She contacted our office today and noted heart fluttering, heart rate was fluctuating between 102 and 66.  Patient stated she would like an appointment for EKG.  Today she presents for follow-up.  She states. Past Medical History:  Diagnosis Date   Anemia    Arthritis    Asthma    Atrial fibrillation ([place])    CLL (chronic lymphocytic leukemia) ([place])    Essential hypertension    GERD (gastroesophageal reflux disease)    History of hiatal hernia    Hyperlipidemia    Paroxysmal atrial fibrillation (HCC)    Splenomegaly     Past Surgical History:  Procedure Laterality Date   ANKLE SURGERY Right    repair fracture   BIOPSY  02/28/2022   Procedure: BIOPSY;  Surgeon: Daneil Dolin, MD;  [facility];  Service: Endoscopy;;  gastric   CARDIOVERSION N/A 03/20/2016   Procedure: CARDIOVERSION;  Surgeon: Satira Sark, MD;  [facility]: [facility];  Service: Cardiovascular;  Laterality: N/A;   CARDIOVERSION N/A 05/22/2016   Procedure: CARDIOVERSION;  Surgeon: Satira Sark, MD;  [facility];  Service:  Cardiovascular;  Laterality: N/A;   CARDIOVERSION N/A 11/20/2021   Procedure: CARDIOVERSION;  Surgeon: Werner Lean, MD;  [facility]: [facility];  Service: Cardiovascular;  Laterality: N/A;   CARDIOVERSION N/A 11/24/2021   Procedure: CARDIOVERSION;  Surgeon: Satira Sark, MD;  [facility];  Service: Cardiovascular;  Laterality: N/A;   CHOLECYSTECTOMY     COLONOSCOPY WITH PROPOFOL N/A 03/27/2018   Internal hemorrhoids, diverticulosis in sigmoid and descending colon, three 4-6 mm polyps at IC valve. tubular adenomas 5 year surveillance   ESOPHAGOGASTRODUODENOSCOPY (EGD) WITH PROPOFOL N/A 03/27/2018   Moderate Schatzki's ring s/p dilation, small hiatal hernia   ESOPHAGOGASTRODUODENOSCOPY (EGD) WITH PROPOFOL N/A 02/28/2022   Procedure: ESOPHAGOGASTRODUODENOSCOPY (EGD) WITH PROPOFOL;  Surgeon: Daneil Dolin, MD;  [facility];  Service: Endoscopy;  Laterality: N/A;  8:15am   HERNIA REPAIR  2011   Incisional and umbilical utilizing mesh   MALONEY DILATION N/A 03/27/2018   Procedure: Venia Minks DILATION;  Surgeon: Daneil Dolin, MD;  [facility];  Service: Endoscopy;  Laterality: N/A;   MALONEY DILATION N/A 02/28/2022   Procedure: Venia Minks DILATION;  Surgeon: Daneil Dolin, MD;  [facility];  Service: Endoscopy;  Laterality: N/A;   POLYPECTOMY  03/27/2018   Procedure: POLYPECTOMY;  Surgeon: Daneil Dolin, MD;  [facility];  Service: Endoscopy;;  colon   ROTATOR CUFF REPAIR Left  TEE WITHOUT CARDIOVERSION N/A 01/17/2016   Procedure: TRANSESOPHAGEAL ECHOCARDIOGRAM (TEE);  Surgeon: Herminio Commons, MD;  [facility];  Service: Cardiovascular;  Laterality: N/A;   TEE WITHOUT CARDIOVERSION N/A 02/21/2016   Procedure: TRANSESOPHAGEAL ECHOCARDIOGRAM (TEE) WITH PROPOFOL;  Surgeon: Herminio Commons, MD;  [facility];  Service: Cardiovascular;  Laterality: N/A;   TEE WITHOUT CARDIOVERSION N/A 03/20/2016   Procedure:  TRANSESOPHAGEAL ECHOCARDIOGRAM (TEE) WITH PROPOFOL;  Surgeon: Satira Sark, MD;  [facility];  Service: Cardiovascular;  Laterality: N/A;   TONSILLECTOMY     TOTAL KNEE ARTHROPLASTY  2022    Current Medications: No outpatient medications have been marked as taking for the 08/23/22 encounter (Appointment) with Finis Bud, NP.     Allergies:   Flagyl [metronidazole]   Social History   Socioeconomic History   Marital status: Married    Spouse name: Not on file   Number of children: 9   Years of education: Not on file   Highest education level: Not on file  Occupational History   Not on file  Tobacco Use   Smoking status: Former    Packs/day: 0.25    Years: 2.00    Total pack years: 0.50    Types: Cigarettes    Quit date: 08/03/1975    Years since quitting: 47.0   Smokeless tobacco: Never  Vaping Use   Vaping Use: Never used  Substance and Sexual Activity   Alcohol use: No    Alcohol/week: 0.0 standard drinks of alcohol   Drug use: No   Sexual activity: Yes    Birth control/protection: Post-menopausal  Other Topics Concern   Not on file  Social History Narrative   Not on file   Social Determinants of Health   Financial Resource Strain: Not on file  Food Insecurity: Not on file  Transportation Needs: Not on file  Physical Activity: Not on file  Stress: Not on file  Social Connections: Not on file     Family History: The patient's ***family history includes Breast cancer in her paternal aunt; Crohn's disease in her son; Leukemia in her mother; Ovarian cancer (age of onset: 92) in her mother; Stroke (age of onset: 24) in her father. There is no history of Colon cancer.  ROS:   Please see the history of present illness.    *** All other systems reviewed and are negative.  EKGs/Labs/Other Studies Reviewed:    The following studies were reviewed today: ***  EKG:  EKG is *** ordered today.  The ekg ordered today demonstrates ***  Recent  Labs: 11/17/2021: TSH 1.987 11/18/2021: Magnesium 2.2 07/14/2022: ALT 18; BUN 12; Creatinine, Ser 0.90; Hemoglobin 13.6; Platelets 106; Potassium 3.9; Sodium 143  Recent Lipid Panel    Component Value Date/Time   CHOL 219 (H) 11/30/2009 1950   TRIG 69 11/30/2009 1950   HDL 56 11/30/2009 1950   CHOLHDL 3.9 Ratio 11/30/2009 1950   VLDL 14 11/30/2009 1950   LDLCALC 149 (H) 11/30/2009 1950     Risk Assessment/Calculations:   {Does this patient have ATRIAL FIBRILLATION?[phone]}  No BP recorded.  {Refresh Note OR Click here to enter BP  :1}***         Physical Exam:    VS:  There were no vitals taken for this visit.    Wt Readings from Last 3 Encounters:  08/14/22 237 lb (107.5 kg)  07/14/22 229 lb (103.9 kg)  06/13/22 236 lb (107 kg)     GEN: *** Well nourished,  well developed in no acute distress HEENT: Normal NECK: No JVD; No carotid bruits LYMPHATICS: No lymphadenopathy CARDIAC: ***RRR, no murmurs, rubs, gallops RESPIRATORY:  Clear to auscultation without rales, wheezing or rhonchi  ABDOMEN: Soft, non-tender, non-distended MUSCULOSKELETAL:  No edema; No deformity  SKIN: Warm and dry NEUROLOGIC:  Alert and oriented x 3 PSYCHIATRIC:  Normal affect   ASSESSMENT:    No diagnosis found. PLAN:    In order of problems listed above:  ***      {Are you ordering a CV Procedure (e.g. stress test, cath, DCCV, TEE, etc)?   Press F2        :[id number]    Medication Adjustments/Labs and Tests Ordered: Current medicines are reviewed at length with the patient today.  Concerns regarding medicines are outlined above.  No orders of the defined types were placed in this encounter.  No orders of the defined types were placed in this encounter.   There are no Patient Instructions on file for this visit.   Signed, Finis Bud, NP  08/23/2022 12:17 PM    [place]

## 2022-08-25 NOTE — Progress Notes (Unsigned)
GI Office Note    Referring Provider: Ludwig Clarks, FNP Primary Care Physician:  Ludwig Clarks, FNP Primary Gastroenterologist: Cristopher Estimable.Rourk, MD  Date:  08/27/2022  ID:  Daisy Black, Daisy Black [birth year], MRN [id number]   Chief Complaint   Chief Complaint  Patient presents with   Abdominal Pain    Still having stomach issues.    History of Present Illness  Daisy Black is a 72 y.o. female with a history of diverticulitis, epigastric pain, GERD, anemia, asthma, HLD, HTN, Afib on eliquis, CLL following in [facility]  presenting today with request to discuss recent CT findings after ED visit.    Colonoscopy in August 2019 with internal hemorrhoids, sigmoid and descending diverticulosis, and 3 polyps at the ileocecal valve.  Due for surveillance in 2024.  EGD August 2023: -Mild Schatzki's ring s/p dilation -Gastric erosions of uncertain significance s/p biopsy revealing antral mucosa with focal erosion and reactive changes, no H. Pylori -Normal duodenum -Reported lansoprazole not helping as much as pantoprazole (which also was not controlling her reflux symptoms)  CT chest, abdomen, pelvis 05/08/2022: -Mild to moderate progression of lymphoma within the chest and lower neck -Similar minimal progression of lymphoma in the abdomen evidenced by adenopathy and splenomegaly -Pulmonary artery enlargement suggesting pulmonary arterial hypertension -Apparent proximal gastric wall thickening most likely due to under distention (recommend correlation with symptoms of gastritis)  Last visit virtual on 06/13/22. Reported worsening epigastric pain since starting rabeprazole. Admitted to eating vegetable soup frequently. Nausea improved with recent probiotic. Typical reflux symptoms well controlled however having abdominal pain. Failed pantoprazole and lansoprazole in the past. Advised to stop lansoprazole and trial omeprazole BID. Advised to take carafate 1g QID for 2 weeks and  then stop. [place] as needed. GERD diet reinforced.   ED visit 07/14/22. C/o abdominal pain, nausea, anorexia for 2 weeks. Last day of antibiotics but having persistent pain to mid abdomen. LLQ pain resolved. EKG with bradycardia and sinus arrhythmia. WBC 25.5, plts 106. Labs otherwise normal. Discharged with tramadol as needed for pain control.   CT A/P 07/14/22 Impression: -There is no evidence of intestinal obstruction or pneumoperitoneum. -There is no hydronephrosis. Appendix is not dilated. -Diverticulosis of colon without signs of focal acute diverticulitis. -Enlarged spleen. There are enlarged lymph nodes in right axilla, abdomen and pelvis consistent with history of lymphoma. There is slight interval decrease in size of the lymph nodes. -diastasis recti -small right fat containing inguinal hernia  Today:  Abdominal pain better than it was before but is still there. Pain 4/10 at its worst. Hurts in the mornings mostly before eating or drinking anything. Pain does not wake her up in the middle of the night. Nauseas sometimes but not vomiting. Has been eating better following a gerd diet and that has improved her symptoms. Having pain everyday, some worse than others. Take carafate sometimes and that does help. Only taking nexium 20 mg daily. Has stopped omeprazole at some point. Unclear at this time.   Did not eat any chocolate through christmas or thanksgiving. It is a big trigger for her. No coffee. Only drinks decaf or non caf tea. (Green tea).  Not a meat eater. Does not fry foods. May eat some french fries more often than not. Has been working on losing weight. Has started eating humus on bread instead of humus.   Reports many of these troubles started after her diverticulitis flare in March/April of last year when she went into Afib and  was started on amiodarone. Queries if this could be contributing to any of her symptoms.    Current Outpatient Medications  Medication Sig  Dispense Refill   acetaminophen (TYLENOL) 650 MG CR tablet Take 650-1,300 mg by mouth daily as needed for pain.     amiodarone (PACERONE) 200 MG tablet Take 1 tablet (200 mg total) by mouth daily. 90 tablet 2   apixaban (ELIQUIS) 5 MG TABS tablet Take 1 tablet (5 mg total) by mouth 2 (two) times daily. 180 tablet 3   Chlorpheniramine Maleate (CHLOR-TABLETS PO) Take 2 tablets by mouth daily as needed (allergies).     esomeprazole (NEXIUM) 40 MG capsule Take 1 capsule (40 mg total) by mouth 2 (two) times daily before a meal. 180 capsule 1   levothyroxine (SYNTHROID, LEVOTHROID) 125 MCG tablet Take 125 mcg by mouth daily before breakfast.     metoprolol tartrate (LOPRESSOR) 25 MG tablet TAKE ONE TABLET BY MOUTH TWO TIMES A DAY 180 tablet 1   sucralfate (CARAFATE) 1 g tablet Take 1 tablet (1 g total) by mouth 4 (four) times daily -  with meals and at bedtime. 120 tablet 1   albuterol (VENTOLIN HFA) 108 (90 Base) MCG/ACT inhaler INHALE 1 PUFF BY MOUTH AS NEEDED INHALATION EVERY 4 HRS (Patient not taking: Reported on 08/27/2022)     docusate sodium (COLACE) 100 MG capsule Take 200 mg by mouth every morning. (Patient not taking: Reported on 08/27/2022)     fluticasone (FLONASE) 50 MCG/ACT nasal spray Place 1 spray into both nostrils daily as needed for allergies or rhinitis. (Patient not taking: Reported on 08/27/2022)     furosemide (LASIX) 20 MG tablet Take 2 tablets (40 mg total) by mouth daily as needed for fluid. (Patient not taking: Reported on 08/27/2022) 180 tablet 2   promethazine (PHENERGAN) 25 MG tablet Take 1 tablet (25 mg total) by mouth every 6 (six) hours as needed for nausea or vomiting. (Patient not taking: Reported on 08/27/2022) 60 tablet 1   traMADol (ULTRAM) 50 MG tablet Take 1 tablet (50 mg total) by mouth every 6 (six) hours as needed for severe pain. (Patient not taking: Reported on 08/27/2022) 10 tablet 0   No current facility-administered medications for this visit.    Facility-Administered Medications Ordered in Other Visits  Medication Dose Route Frequency Provider Last Rate Last Admin   hydrocortisone cream 1 % 1 application  1 application  Topical TID PRN Satira Sark, MD        Past Medical History:  Diagnosis Date   Anemia    Arthritis    Asthma    Atrial fibrillation ([place])    CLL (chronic lymphocytic leukemia) ([place])    Essential hypertension    GERD (gastroesophageal reflux disease)    History of hiatal hernia    Hyperlipidemia    Paroxysmal atrial fibrillation ([place])    Splenomegaly     Past Surgical History:  Procedure Laterality Date   ANKLE SURGERY Right    repair fracture   BIOPSY  02/28/2022   Procedure: BIOPSY;  Surgeon: Daneil Dolin, MD;  [facility];  Service: Endoscopy;;  gastric   CARDIOVERSION N/A 03/20/2016   Procedure: CARDIOVERSION;  Surgeon: Satira Sark, MD;  [facility];  Service: Cardiovascular;  Laterality: N/A;   CARDIOVERSION N/A 05/22/2016   Procedure: CARDIOVERSION;  Surgeon: Satira Sark, MD;  [facility];  Service: Cardiovascular;  Laterality: N/A;   CARDIOVERSION N/A 11/20/2021   Procedure: CARDIOVERSION;  Surgeon: Werner Lean, MD;  [facility];  Service: Cardiovascular;  Laterality: N/A;   CARDIOVERSION N/A 11/24/2021   Procedure: CARDIOVERSION;  Surgeon: Satira Sark, MD;  [facility];  Service: Cardiovascular;  Laterality: N/A;   CHOLECYSTECTOMY     COLONOSCOPY WITH PROPOFOL N/A 03/27/2018   Internal hemorrhoids, diverticulosis in sigmoid and descending colon, three 4-6 mm polyps at IC valve. tubular adenomas 5 year surveillance   ESOPHAGOGASTRODUODENOSCOPY (EGD) WITH PROPOFOL N/A 03/27/2018   Moderate Schatzki's ring s/p dilation, small hiatal hernia   ESOPHAGOGASTRODUODENOSCOPY (EGD) WITH PROPOFOL N/A 02/28/2022   Procedure: ESOPHAGOGASTRODUODENOSCOPY (EGD) WITH PROPOFOL;  Surgeon: Daneil Dolin, MD;  [facility];  Service:  Endoscopy;  Laterality: N/A;  8:15am   HERNIA REPAIR  2011   Incisional and umbilical utilizing mesh   MALONEY DILATION N/A 03/27/2018   Procedure: Venia Minks DILATION;  Surgeon: Daneil Dolin, MD;  [facility];  Service: Endoscopy;  Laterality: N/A;   MALONEY DILATION N/A 02/28/2022   Procedure: Venia Minks DILATION;  Surgeon: Daneil Dolin, MD;  [facility];  Service: Endoscopy;  Laterality: N/A;   POLYPECTOMY  03/27/2018   Procedure: POLYPECTOMY;  Surgeon: Daneil Dolin, MD;  [facility];  Service: Endoscopy;;  colon   ROTATOR CUFF REPAIR Left    TEE WITHOUT CARDIOVERSION N/A 01/17/2016   Procedure: TRANSESOPHAGEAL ECHOCARDIOGRAM (TEE);  Surgeon: Herminio Commons, MD;  [facility];  Service: Cardiovascular;  Laterality: N/A;   TEE WITHOUT CARDIOVERSION N/A 02/21/2016   Procedure: TRANSESOPHAGEAL ECHOCARDIOGRAM (TEE) WITH PROPOFOL;  Surgeon: Herminio Commons, MD;  [facility];  Service: Cardiovascular;  Laterality: N/A;   TEE WITHOUT CARDIOVERSION N/A 03/20/2016   Procedure: TRANSESOPHAGEAL ECHOCARDIOGRAM (TEE) WITH PROPOFOL;  Surgeon: Satira Sark, MD;  [facility];  Service: Cardiovascular;  Laterality: N/A;   TONSILLECTOMY     TOTAL KNEE ARTHROPLASTY  2022    Family History  Problem Relation Age of Onset   Ovarian cancer Mother 70       Secondary to ovarian cancer   Leukemia Mother    Stroke Father 41       Brain stem infarction   Breast cancer Paternal Aunt    Crohn's disease Son    Colon cancer Neg Hx     Allergies as of 08/27/2022 - Review Complete 08/27/2022  Allergen Reaction Noted   Flagyl [metronidazole] Other (See Comments) 05/01/2019    Social History   Socioeconomic History   Marital status: Married    Spouse name: Not on file   Number of children: 9   Years of education: Not on file   Highest education level: Not on file  Occupational History   Not on file  Tobacco Use   Smoking status: Former     Packs/day: 0.25    Years: 2.00    Total pack years: 0.50    Types: Cigarettes    Quit date: 08/03/1975    Years since quitting: 47.0   Smokeless tobacco: Never  Vaping Use   Vaping Use: Never used  Substance and Sexual Activity   Alcohol use: No    Alcohol/week: 0.0 standard drinks of alcohol   Drug use: No   Sexual activity: Yes    Birth control/protection: Post-menopausal  Other Topics Concern   Not on file  Social History Narrative   Not on file   Social Determinants of Health   Financial Resource Strain: Not on file  Food Insecurity:  Not on file  Transportation Needs: Not on file  Physical Activity: Not on file  Stress: Not on file  Social Connections: Not on file     Review of Systems   Gen: Denies fever, chills, anorexia. Denies fatigue, weakness, weight loss.  CV: Denies chest pain, palpitations, syncope, peripheral edema, and claudication. Resp: Denies dyspnea at rest, cough, wheezing, coughing up blood, and pleurisy. GI: See HPI Derm: Denies rash, itching, dry skin Psych: Denies depression, anxiety, memory loss, confusion. No homicidal or suicidal ideation.  Heme: Denies bruising, bleeding, and enlarged lymph nodes.   Physical Exam   BP (!) 148/72 (BP Location: Right Arm, Patient Position: Sitting, Cuff Size: Normal)   Pulse 72   Temp 97.7 F (36.5 C) (Temporal)   Ht '5\' 8"'$  (1.727 m)   Wt 238 lb (108 kg)   SpO2 97%   BMI 36.19 kg/m   General:   Alert and oriented. No distress noted. Pleasant and cooperative.  Head:  Normocephalic and atraumatic. Eyes:  Conjuctiva clear without scleral icterus. Mouth:  Oral mucosa pink and moist. Good dentition. No lesions. Lungs:  Clear to auscultation bilaterally. No wheezes, rales, or rhonchi. No distress.  Heart:  S1, S2 present without murmurs appreciated.  Abdomen:  +BS, soft, non-tender and non-distended. No rebound or guarding. No HSM or masses noted. Rectal: deferred Msk:  Symmetrical without gross  deformities. Normal posture. Extremities:  Without edema. Neurologic:  Alert and  oriented x4 Psych:  Alert and cooperative. Normal mood and affect.   Assessment  Daisy Black is a 72 y.o. female with a history of diverticulitis, epigastric pain, GERD, anemia, asthma, HLD, HTN, Afib on eliquis, CLL following in [facility]  presenting today with request to discuss recent CT findings after ED visit.   Epigastric pain, GERD, Nausea: Chronic intermittent epigastric pain. Previously tried and failed multiple PPIs. ED visit for worsening pain 07/14/22 and had CT A/P with no acute abnormality, chronic and stable lymphadenopathy. Patient query possibility of amiodarone contributing to her symptoms.  Carafate has been helpful for use as needed however states it has been expensive although it appears she has been taking suspension therefore we will trial tablets for her to dissolve and make into her own story.  Directions provided.  At her last visit she was advised to begin omeprazole 40 mg twice daily however at some point it appears she was switched to Nexium and has been taking 20 mg twice daily she believes.  States her symptoms are improved from prior but still waking up with epigastric pain described as 4/10 at its worst.  Also with occasional nausea.  States that given the possibility of having to undergo ablation or hospital admission to change antiarrhythmic she would rather deal with possible GI side effects.  Advised to increase esomeprazole to 40 mg twice daily for now and we will consider weaning back to daily dosing follow-up.  Advise she may use famotidine 20 mg nightly for breakthrough.  We discussed diet and advised for her to cut back or avoid garlic and onions as these could be contributors to her reflux.  We discussed gastritis in detail and recommendations going forward. Will plan to follow-up in 3 months, or sooner if needed.  Patient advised to call if symptoms worsen.   PLAN    Continue carafate 1g QID as needed (switch to tablets and make her own slurry). Refilled today.  Continue esomeprazole, increase to 40 mg BID. Will take 2 pills BID for  now until supply used.  May use famotidine 20 mg nightly for breakthrough.  GERD diet, avoid garlic and onions. Low fat diet.  Follow up in 3 months.      Venetia Night, MSN, FNP-BC, AGACNP-BC [facility]

## 2022-08-27 ENCOUNTER — Encounter: Payer: Self-pay | Admitting: Gastroenterology

## 2022-08-27 ENCOUNTER — Telehealth: Payer: Self-pay | Admitting: *Deleted

## 2022-08-27 ENCOUNTER — Ambulatory Visit (INDEPENDENT_AMBULATORY_CARE_PROVIDER_SITE_OTHER): Payer: Medicare Other | Admitting: Gastroenterology

## 2022-08-27 VITALS — BP 148/72 | HR 72 | Temp 97.7°F | Ht 68.0 in | Wt 238.0 lb

## 2022-08-27 DIAGNOSIS — K219 Gastro-esophageal reflux disease without esophagitis: Secondary | ICD-10-CM

## 2022-08-27 DIAGNOSIS — R1013 Epigastric pain: Secondary | ICD-10-CM | POA: Diagnosis not present

## 2022-08-27 MED ORDER — SUCRALFATE 1 G PO TABS: 1.0000 g | ORAL_TABLET | Freq: Three times a day (TID) | ORAL | 1 refills | Status: DC

## 2022-08-27 MED ORDER — ESOMEPRAZOLE MAGNESIUM 40 MG PO CPDR: 40.0000 mg | DELAYED_RELEASE_CAPSULE | Freq: Two times a day (BID) | ORAL | 1 refills | Status: DC

## 2022-08-27 NOTE — Telephone Encounter (Signed)
Noted  

## 2022-08-27 NOTE — Patient Instructions (Addendum)
Follow a GERD diet:  Avoid fried, fatty, greasy, spicy, citrus foods. Avoid caffeine and carbonated beverages. Avoid chocolate. Try eating 4-6 small meals a day rather than 3 large meals. Do not eat within 3 hours of laying down. Prop head of bed up on wood or bricks to create a 6 inch incline.  Try avoiding or cutting back on garlic, onions, high fat foods as these can contribute to reflux and upper abdominal discomfort/gastritis.   I am increasing your nexium to 40 mg twice daily if you are taking 20 mg twice daily currently. Please let me know once you get home. I have also refilled your carafate for you. Please dissolve tablet in 1-2 oz of water and drink like a slurry.   You may take famotidine 20 mg nightly as needed for worsening abdominal discomfort or if you know you will be eating a food that causes worsening symptoms. I believe night time dose would help with your morning discomfort.   We will plan to follow-up in 2-3 months.  Please not hesitate to call sooner if needed.  It was a pleasure to see you today. I want to create trusting relationships with patients. If you receive a survey regarding your visit,  I greatly appreciate you taking time to fill this out on paper or through your MyChart. I value your feedback.  Venetia Night, MSN, FNP-BC, AGACNP-BC [facility]

## 2022-08-27 NOTE — Telephone Encounter (Signed)
Pt called and states she is taking Nexium '40mg'$ .

## 2022-08-29 ENCOUNTER — Telehealth: Payer: Self-pay | Admitting: *Deleted

## 2022-08-29 NOTE — Telephone Encounter (Signed)
Pt called and states she is taking '40mg'$  of Nexium. She states she thought she was taking '20mg'$  twice a day, but is not. She is not clear as to how many Nexium to take daily. Please advise.

## 2022-08-30 NOTE — Telephone Encounter (Signed)
Noted. Informed pt.  

## 2022-10-04 ENCOUNTER — Other Ambulatory Visit: Payer: Self-pay | Admitting: Student

## 2022-10-29 ENCOUNTER — Ambulatory Visit: Payer: Medicare Other | Admitting: Gastroenterology

## 2022-11-09 ENCOUNTER — Ambulatory Visit
Admission: RE | Admit: 2022-11-09 | Discharge: 2022-11-09 | Disposition: A | Discharge status: Home / Self Care | Payer: Medicare Other | Source: Ambulatory Visit | Attending: Oncology | Admitting: Oncology

## 2022-11-09 DIAGNOSIS — C9110 Chronic lymphocytic leukemia of B-cell type not having achieved remission: Secondary | ICD-10-CM

## 2022-11-09 MED ORDER — IOHEXOL 300 MG/ML  SOLN
100.0000 mL | Freq: Once | INTRAMUSCULAR | Status: AC | PRN
  Administered 2022-11-09: 100 mL via INTRAVENOUS

## 2022-11-12 ENCOUNTER — Other Ambulatory Visit: Payer: Self-pay | Admitting: Cardiology

## 2022-11-20 ENCOUNTER — Other Ambulatory Visit: Payer: Self-pay

## 2022-11-20 ENCOUNTER — Encounter: Payer: Self-pay | Admitting: Oncology

## 2022-11-20 ENCOUNTER — Inpatient Hospital Stay: Payer: Medicare Other | Attending: Oncology

## 2022-11-20 ENCOUNTER — Inpatient Hospital Stay (HOSPITAL_BASED_OUTPATIENT_CLINIC_OR_DEPARTMENT_OTHER): Payer: Medicare Other | Admitting: Oncology

## 2022-11-20 VITALS — BP 142/69 | HR 76 | Temp 96.0°F | Resp 17 | Wt 242.0 lb

## 2022-11-20 DIAGNOSIS — Z87891 Personal history of nicotine dependence: Secondary | ICD-10-CM | POA: Insufficient documentation

## 2022-11-20 DIAGNOSIS — R161 Splenomegaly, not elsewhere classified: Secondary | ICD-10-CM | POA: Diagnosis not present

## 2022-11-20 DIAGNOSIS — C9110 Chronic lymphocytic leukemia of B-cell type not having achieved remission: Secondary | ICD-10-CM

## 2022-11-20 DIAGNOSIS — D696 Thrombocytopenia, unspecified: Secondary | ICD-10-CM | POA: Diagnosis not present

## 2022-11-20 LAB — CBC WITH DIFFERENTIAL/PLATELET
Abs Immature Granulocytes: 0.04 10*3/uL (ref 0.00–0.07)
Basophils Absolute: 0.1 10*3/uL (ref 0.0–0.1)
Basophils Relative: 0 %
Eosinophils Absolute: 0.2 10*3/uL (ref 0.0–0.5)
Eosinophils Relative: 1 %
HCT: 41.9 % (ref 36.0–46.0)
Hemoglobin: 12.7 g/dL (ref 12.0–15.0)
Immature Granulocytes: 0 %
Lymphocytes Relative: 82 %
Lymphs Abs: 24.8 10*3/uL — ABNORMAL HIGH (ref 0.7–4.0)
MCH: 29.5 pg (ref 26.0–34.0)
MCHC: 30.3 g/dL (ref 30.0–36.0)
MCV: 97.2 fL (ref 80.0–100.0)
Monocytes Absolute: 1.9 10*3/uL — ABNORMAL HIGH (ref 0.1–1.0)
Monocytes Relative: 6 %
Neutro Abs: 3.4 10*3/uL (ref 1.7–7.7)
Neutrophils Relative %: 11 %
Platelets: 135 10*3/uL — ABNORMAL LOW (ref 150–400)
RBC: 4.31 MIL/uL (ref 3.87–5.11)
RDW: 15 % (ref 11.5–15.5)
Smear Review: NORMAL
WBC: 30.3 10*3/uL — ABNORMAL HIGH (ref 4.0–10.5)
nRBC: 0.1 % (ref 0.0–0.2)

## 2022-11-20 LAB — COMPREHENSIVE METABOLIC PANEL
ALT: 22 U/L (ref 0–44)
AST: 27 U/L (ref 15–41)
Albumin: 4.1 g/dL (ref 3.5–5.0)
Alkaline Phosphatase: 111 U/L (ref 38–126)
Anion gap: 6 (ref 5–15)
BUN: 19 mg/dL (ref 8–23)
CO2: 29 mmol/L (ref 22–32)
Calcium: 9.8 mg/dL (ref 8.9–10.3)
Chloride: 108 mmol/L (ref 98–111)
Creatinine, Ser: 0.85 mg/dL (ref 0.44–1.00)
GFR, Estimated: >60 mL/min (ref >60)
Glucose, Bld: 89 mg/dL (ref 70–99)
Potassium: 4.1 mmol/L (ref 3.5–5.1)
Sodium: 143 mmol/L (ref 135–145)
Total Bilirubin: 0.6 mg/dL (ref 0.3–1.2)
Total Protein: 6.6 g/dL (ref 6.5–8.1)

## 2022-11-20 MED ORDER — HYDROCODONE-ACETAMINOPHEN 5-325 MG PO TABS: 1.0000 | ORAL_TABLET | Freq: Four times a day (QID) | ORAL | 0 refills | Status: DC | PRN

## 2022-11-20 NOTE — Progress Notes (Signed)
[facility]  [phone] [phone]  ID: Daisy Black OB: November 25, 1950  MR#: [id number]  QIO#:[id number]  Patient Care Team: Oneal Grout, FNP as PCP - General (Family Medicine) Jonelle Sidle, MD as PCP - Cardiology (Cardiology) Franky Macho, MD (General Surgery) Jim Like, RN as Registered Nurse Scarlett Presto, RN (Inactive) as Registered Nurse Jonelle Sidle, MD as Consulting Physician (Cardiology) Corbin Ade, MD as Consulting Physician (Gastroenterology) Jeralyn Ruths, MD as Consulting Physician (Hematology and Oncology) Oneal Grout, FNP (Family Medicine)  CHIEF COMPLAINT: CLL  INTERVAL HISTORY: Patient returns to clinic today for repeat laboratory work, further evaluation, and discussion of her imaging results.  She continues to have intermittent left flank pain related to her splenomegaly, but otherwise feels well.  She does not complain of any weakness or fatigue. She has no neurologic complaints.  She denies any fevers, chills, or night sweats.  She has no chest pain, shortness of breath, cough, or hemoptysis. She denies any nausea, vomiting, constipation, or diarrhea.  She has no urinary complaints.  Patient offers no further specific complaints today.  REVIEW OF SYSTEMS:   Review of Systems  Constitutional: Negative.  Negative for diaphoresis, fever, malaise/fatigue and weight loss.  Respiratory: Negative.  Negative for cough and shortness of breath.   Cardiovascular: Negative.  Negative for chest pain and leg swelling.  Gastrointestinal: Negative.  Negative for abdominal pain, blood in stool, constipation, diarrhea, heartburn, melena, nausea and vomiting.  Genitourinary:  Positive for flank pain.  Musculoskeletal:  Negative for back pain and joint pain.  Skin: Negative.  Negative for rash.  Neurological: Negative.  Negative for dizziness, sensory change, focal weakness, weakness and headaches.   Psychiatric/Behavioral: Negative.  The patient is not nervous/anxious.     As per HPI. Otherwise, a complete review of systems is negative.  PAST MEDICAL HISTORY: Past Medical History:  Diagnosis Date   Anemia    Arthritis    Asthma    Atrial fibrillation    CLL (chronic lymphocytic leukemia)    Essential hypertension    GERD (gastroesophageal reflux disease)    History of hiatal hernia    Hyperlipidemia    Paroxysmal atrial fibrillation    Splenomegaly     PAST SURGICAL HISTORY: Past Surgical History:  Procedure Laterality Date   ANKLE SURGERY Right    repair fracture   BIOPSY  02/28/2022   Procedure: BIOPSY;  Surgeon: Corbin Ade, MD;  [facility]: [facility];  Service: Endoscopy;;  gastric   CARDIOVERSION N/A 03/20/2016   Procedure: CARDIOVERSION;  Surgeon: Jonelle Sidle, MD;  Location: [facility];  Service: Cardiovascular;  Laterality: N/A;   CARDIOVERSION N/A 05/22/2016   Procedure: CARDIOVERSION;  Surgeon: Jonelle Sidle, MD;  Location: [facility];  Service: Cardiovascular;  Laterality: N/A;   CARDIOVERSION N/A 11/20/2021   Procedure: CARDIOVERSION;  Surgeon: Christell Constant, MD;  Location: [facility];  Service: Cardiovascular;  Laterality: N/A;   CARDIOVERSION N/A 11/24/2021   Procedure: CARDIOVERSION;  Surgeon: Jonelle Sidle, MD;  Location: [facility];  Service: Cardiovascular;  Laterality: N/A;   CHOLECYSTECTOMY     COLONOSCOPY WITH PROPOFOL N/A 03/27/2018   Internal hemorrhoids, diverticulosis in sigmoid and descending colon, three 4-6 mm polyps at IC valve. tubular adenomas 5 year surveillance   ESOPHAGOGASTRODUODENOSCOPY (EGD) WITH PROPOFOL N/A 03/27/2018   Moderate Schatzki's ring s/p dilation, small hiatal hernia   ESOPHAGOGASTRODUODENOSCOPY (EGD) WITH PROPOFOL N/A 02/28/2022   Procedure: ESOPHAGOGASTRODUODENOSCOPY (EGD)  WITH PROPOFOL;  Surgeon: Corbin Ade, MD;  [facility]: [facility];  Service: Endoscopy;  Laterality: N/A;  8:15am   HERNIA REPAIR   2011   Incisional and umbilical utilizing mesh   MALONEY DILATION N/A 03/27/2018   Procedure: Elease Hashimoto DILATION;  Surgeon: Corbin Ade, MD;  [facility];  Service: Endoscopy;  Laterality: N/A;   MALONEY DILATION N/A 02/28/2022   Procedure: Elease Hashimoto DILATION;  Surgeon: Corbin Ade, MD;  [facility];  Service: Endoscopy;  Laterality: N/A;   POLYPECTOMY  03/27/2018   Procedure: POLYPECTOMY;  Surgeon: Corbin Ade, MD;  [facility];  Service: Endoscopy;;  colon   ROTATOR CUFF REPAIR Left    TEE WITHOUT CARDIOVERSION N/A 01/17/2016   Procedure: TRANSESOPHAGEAL ECHOCARDIOGRAM (TEE);  Surgeon: Laqueta Linden, MD;  Location: [facility];  Service: Cardiovascular;  Laterality: N/A;   TEE WITHOUT CARDIOVERSION N/A 02/21/2016   Procedure: TRANSESOPHAGEAL ECHOCARDIOGRAM (TEE) WITH PROPOFOL;  Surgeon: Laqueta Linden, MD;  Location: [facility];  Service: Cardiovascular;  Laterality: N/A;   TEE WITHOUT CARDIOVERSION N/A 03/20/2016   Procedure: TRANSESOPHAGEAL ECHOCARDIOGRAM (TEE) WITH PROPOFOL;  Surgeon: Jonelle Sidle, MD;  Location: [facility];  Service: Cardiovascular;  Laterality: N/A;   TONSILLECTOMY     TOTAL KNEE ARTHROPLASTY  2022    FAMILY HISTORY Family History  Problem Relation Age of Onset   Ovarian cancer Mother 24       Secondary to ovarian cancer   Leukemia Mother    Stroke Father 43       Brain stem infarction   Breast cancer Paternal Aunt    Crohn's disease Son    Colon cancer Neg Hx        ADVANCED DIRECTIVES:    HEALTH MAINTENANCE: Social History   Tobacco Use   Smoking status: Former    Packs/day: 0.25    Years: 2.00    Additional pack years: 0.00    Total pack years: 0.50    Types: Cigarettes    Quit date: 08/03/1975    Years since quitting: 47.3   Smokeless tobacco: Never  Vaping Use   Vaping Use: Never used  Substance Use Topics   Alcohol use: No    Alcohol/week: 0.0 standard drinks of alcohol   Drug use: No      Colonoscopy:  PAP:  Bone density:  Lipid panel:  Allergies  Allergen Reactions   Flagyl [Metronidazole] Other (See Comments)    Classic side effect of severe nausea, unable to tolerate.     Current Outpatient Medications  Medication Sig Dispense Refill   acetaminophen (TYLENOL) 650 MG CR tablet Take 650-1,300 mg by mouth daily as needed for pain.     amiodarone (PACERONE) 200 MG tablet Take 1 tablet (200 mg total) by mouth daily. 90 tablet 1   apixaban (ELIQUIS) 5 MG TABS tablet Take 1 tablet (5 mg total) by mouth 2 (two) times daily. 180 tablet 3   Chlorpheniramine Maleate (CHLOR-TABLETS PO) Take 2 tablets by mouth daily as needed (allergies).     docusate sodium (COLACE) 100 MG capsule Take 200 mg by mouth every morning.     esomeprazole (NEXIUM) 40 MG capsule Take 1 capsule (40 mg total) by mouth 2 (two) times daily before a meal. 180 capsule 1   furosemide (LASIX) 20 MG tablet TAKE TWO TABLETS BY MOUTH DAILY AS NEEDED FOR FLUID 180 tablet 1   ibuprofen (ADVIL) 800 MG tablet Take 1 tablet 3 times  a day by oral route as needed for 10 days.     levothyroxine (SYNTHROID, LEVOTHROID) 125 MCG tablet Take 125 mcg by mouth daily before breakfast.     metoprolol tartrate (LOPRESSOR) 25 MG tablet TAKE ONE TABLET BY MOUTH TWO TIMES A DAY 180 tablet 1   promethazine (PHENERGAN) 25 MG tablet Take 1 tablet (25 mg total) by mouth every 6 (six) hours as needed for nausea or vomiting. 60 tablet 1   sucralfate (CARAFATE) 1 g tablet Take 1 tablet (1 g total) by mouth 4 (four) times daily -  with meals and at bedtime. 120 tablet 1   traMADol (ULTRAM) 50 MG tablet Take 1 tablet (50 mg total) by mouth every 6 (six) hours as needed for severe pain. 10 tablet 0   albuterol (VENTOLIN HFA) 108 (90 Base) MCG/ACT inhaler INHALE 1 PUFF BY MOUTH AS NEEDED INHALATION EVERY 4 HRS (Patient not taking: Reported on 08/27/2022)     fluticasone (FLONASE) 50 MCG/ACT nasal spray Place 1 spray into both nostrils daily  as needed for allergies or rhinitis. (Patient not taking: Reported on 08/27/2022)     HYDROcodone-acetaminophen (NORCO/VICODIN) 5-325 MG tablet Take 1 tablet by mouth every 6 (six) hours as needed. 30 tablet 0   No current facility-administered medications for this visit.   Facility-Administered Medications Ordered in Other Visits  Medication Dose Route Frequency Provider Last Rate Last Admin   hydrocortisone cream 1 % 1 application  1 application  Topical TID PRN Jonelle Sidle, MD        OBJECTIVE: Vitals:   11/20/22 1351  BP: (!) 142/69  Pulse: 76  Resp: 17  Temp: (!) 96 F (35.6 C)  SpO2: 99%     Body mass index is 36.8 kg/m.    ECOG FS:0 - Asymptomatic  General: Well-developed, well-nourished, no acute distress. Eyes: Pink conjunctiva, anicteric sclera. HEENT: Normocephalic, moist mucous membranes. Lungs: No audible wheezing or coughing. Heart: Regular rate and rhythm. Abdomen: Soft, nontender, no obvious distention. Musculoskeletal: No edema, cyanosis, or clubbing. Neuro: Alert, answering all questions appropriately. Cranial nerves grossly intact. Skin: No rashes or petechiae noted. Psych: Normal affect.  LAB RESULTS:  Lab Results  Component Value Date   NA 143 11/20/2022   K 4.1 11/20/2022   CL 108 11/20/2022   CO2 29 11/20/2022   GLUCOSE 89 11/20/2022   BUN 19 11/20/2022   CREATININE 0.85 11/20/2022   CALCIUM 9.8 11/20/2022   PROT 6.6 11/20/2022   ALBUMIN 4.1 11/20/2022   AST 27 11/20/2022   ALT 22 11/20/2022   ALKPHOS 111 11/20/2022   BILITOT 0.6 11/20/2022   GFRNONAA >60 11/20/2022   GFRAA >60 03/09/2020    Lab Results  Component Value Date   WBC 30.3 (H) 11/20/2022   NEUTROABS PENDING 11/20/2022   HGB 12.7 11/20/2022   HCT 41.9 11/20/2022   MCV 97.2 11/20/2022   PLT 135 (L) 11/20/2022     STUDIES: CT CHEST ABDOMEN PELVIS W CONTRAST  Result Date: 11/11/2022 CLINICAL DATA:  Hematologic malignancy, restaging CLL. * Tracking Code: BO *.  EXAM: CT CHEST, ABDOMEN, AND PELVIS WITH CONTRAST TECHNIQUE: Multidetector CT imaging of the chest, abdomen and pelvis was performed following the standard protocol during bolus administration of intravenous contrast. RADIATION DOSE REDUCTION: This exam was performed according to the departmental dose-optimization program which includes automated exposure control, adjustment of the mA and/or kV according to patient size and/or use of iterative reconstruction technique. CONTRAST:  OMNIPAQUE IOHEXOL 300 MG/ML  SOLN COMPARISON:  Multiple priors including CT May 08, 2022 and July 14, 2022. FINDINGS: CT CHEST FINDINGS Cardiovascular: Normal caliber thoracic aorta. Normal size heart. No significant pericardial effusion/thickening. Mediastinum/Nodes: No significant interval change in the bilateral lower jugular and supraclavicular lymph nodes with the previously indexed right posterior triangle lymph node measuring 7 mm in short axis on image 2/2, unchanged. No significant interval change in the bilateral axillary and mediastinal adenopathy. Previously indexed lymph nodes are as follows: -right axillary lymph node measures 2.2 cm in short axis on image 24/2 previously 2.3 cm. -subcarinal lymph node measures 1.5 cm in short axis on image 29/2, unchanged. Multiple thyroid nodules are again seen measuring up to 14 mm. Not clinically significant; no follow-up imaging recommended (ref: J Am Coll Radiol. 2015 Feb;12(2): 143-50). The esophagus is grossly unremarkable. Lungs/Pleura: No suspicious pulmonary nodules or masses. Scattered atelectasis/scarring. No pleural effusion. No pneumothorax. Musculoskeletal: No aggressive lytic or blastic lesion of bone. CT ABDOMEN PELVIS FINDINGS Hepatobiliary: No suspicious hepatic lesion. Gallbladder surgically absent. No biliary ductal dilation. Pancreas: No pancreatic ductal dilation or evidence of acute inflammation. Spleen: Subcentimeter hypodense splenic lesions are  decreased in conspicuity. Similar splenomegaly measuring 17.1 cm in maximum craniocaudal dimension previously 17.2 cm. Adrenals/Urinary Tract: Bilateral adrenal glands are within normal limits. No hydronephrosis. Kidneys demonstrate symmetric enhancement. No suspicious renal mass. Urinary bladder is unremarkable for degree of distension. Stomach/Bowel: No radiopaque enteric contrast material was administered. Stomach is minimally distended limiting evaluation. No pathologic dilation of small or large bowel. No evidence of acute bowel inflammation. Colonic diverticulosis without findings of acute diverticulitis. Vascular/Lymphatic: Aortic atherosclerosis. The portal, splenic and superior mesenteric veins are patent. Indexed lymph nodes are as follows: -portacaval lymph node measures 14 mm in short axis on image 58/2 previously 15 mm -gastrohepatic ligament lymph node measures 14 mm in short axis on image 53/2 previously 13 mm. -right external iliac lymph node measures 17 mm in short axis on image 107/2, unchanged -left external iliac lymph node measures 15 mm in short axis on image 106/2 previously 16 mm. -left common iliac lymph node measures 14 mm in short axis on image 79/2 previously 13 mm. Reproductive: Uterus and bilateral adnexa are unremarkable. Other: No significant abdominopelvic free fluid. Musculoskeletal: No aggressive lytic or blastic lesion of bone. Multilevel degenerative changes spine. IMPRESSION: 1. Stable adenopathy above and below the diaphragm with similar splenomegaly. 2. Colonic diverticulosis without findings of acute diverticulitis. 3.  Aortic Atherosclerosis (ICD10-I70.0). Electronically Signed   By: Maudry Mayhew M.D.   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  PLAN:    CLL: Patient completed her second round of weekly single agent Rituxan x4 on August 20, 2019.  Her most recent CT scans on November 11, 2022 reviewed  independently and report as above with essentially unchanged lymphadenopathy and splenomegaly of 17.1 cm.  Her white blood cell count is also chronic and unchanged at 30.3.  No intervention is necessary at this time.  If treatment is indicated, patient has indicated that she would prefer Imbruvica or zanubrutinib.  Return to clinic in 6 months with repeat imaging, laboratory work, and further evaluation. Flank pain: Likely secondary to splenomegaly.  Patient was given a prescription for Vicodin today. Early satiety, indigestion: Patient does not complain of this today.  Likely secondary to splenomegaly.  Atrial fibrillation: Continue monitoring and treatment per cardiology. Iron deficiency anemia: Resolved.  She last received IV Feraheme in May  2017.  PET positive thyroid lesions: 1.2 cm right lobe nodule also seen on thyroid ultrasound April 06, 2016.  No change on recent CT scan.  Continue follow-up with ENT as indicated. Thrombocytopenia: Mildly improved.  Patient's platelet count is 135 today.  Likely related to underlying splenomegaly.    Patient expressed understanding and was in agreement with this plan. She also understands that She can call clinic at any time with any questions, concerns, or complaints.     Jeralyn Ruths, MD   11/20/2022 2:15 PM

## 2022-11-20 NOTE — Progress Notes (Signed)
Patient here for oncology follow-up appointment, expresses spleen pain that comes and goes

## 2023-01-07 ENCOUNTER — Other Ambulatory Visit: Payer: Self-pay | Admitting: Student

## 2023-01-28 ENCOUNTER — Ambulatory Visit: Payer: Medicare Other | Attending: Cardiology | Admitting: Cardiology

## 2023-01-28 ENCOUNTER — Encounter: Payer: Self-pay | Admitting: Cardiology

## 2023-01-28 VITALS — BP 130/72 | HR 70 | Ht 67.0 in | Wt 237.6 lb

## 2023-01-28 DIAGNOSIS — I480 Paroxysmal atrial fibrillation: Secondary | ICD-10-CM | POA: Diagnosis present

## 2023-01-28 DIAGNOSIS — I10 Essential (primary) hypertension: Secondary | ICD-10-CM | POA: Insufficient documentation

## 2023-01-28 MED ORDER — APIXABAN 5 MG PO TABS: 5.0000 mg | ORAL_TABLET | Freq: Two times a day (BID) | ORAL | 3 refills | Status: DC

## 2023-01-28 NOTE — Progress Notes (Signed)
    Cardiology Office Note  Date: 01/28/2023   ID: Daisy Black, DOB 06/08/1951, MRN [id number]  History of Present Illness: Daisy Black is a 72 y.o. female last seen in January.  She is here for a routine visit.  Does report intermittent palpitations, sometimes in the evenings, but no progressive pattern.  She has generally done well on amiodarone along with Eliquis.  Denies any spontaneous bleeding problems.  I reviewed her interval lab work from April and earlier in the year.  We went over her medications which are otherwise stable, she is also on low-dose Lopressor.  ECG from January reviewed.  Physical Exam: VS:  BP 130/72   Pulse 70   Ht 5\' 7"  (1.702 m)   Wt 237 lb 9.6 oz (107.8 kg)   SpO2 98%   BMI 37.21 kg/m , BMI Body mass index is 37.21 kg/m.  Wt Readings from Last 3 Encounters:  01/28/23 237 lb 9.6 oz (107.8 kg)  11/20/22 242 lb (109.8 kg)  08/27/22 238 lb (108 kg)    General: Patient appears comfortable at rest. HEENT: Conjunctiva and lids normal. Neck: Supple, no elevated JVP or carotid bruits. Lungs: Clear to auscultation, nonlabored breathing at rest. Cardiac: Regular rate and rhythm, no S3 or significant systolic murmur. Extremities: No pitting edema.  ECG:  An ECG dated 08/14/2022 was personally reviewed today and demonstrated:  Sinus rhythm with prolonged PR interval.  Labwork: January 2024: TSH 1.54 11/20/2022: ALT 22; AST 27; BUN 19; Creatinine, Ser 0.85; Hemoglobin 12.7; Platelets 135; Potassium 4.1; Sodium 143   Other Studies Reviewed Today:  Echocardiogram 05/15/2022:  1. Left ventricular ejection fraction, by estimation, is 65 to 70%. The  left ventricle has normal function. The left ventricle has no regional  wall motion abnormalities. Left ventricular diastolic parameters were  normal. The average left ventricular  global longitudinal strain is -24.0 %. The global longitudinal strain is  normal.   2. Right ventricular systolic function  is normal. The right ventricular  size is normal.   3. The mitral valve is normal in structure. No evidence of mitral valve  regurgitation. No evidence of mitral stenosis.   4. The aortic valve is tricuspid. Aortic valve regurgitation is not  visualized. No aortic stenosis is present.   5. The inferior vena cava is normal in size with greater than 50%  respiratory variability, suggesting right atrial pressure of 3 mmHg.   Assessment and Plan:  1.  Paroxysmal to persistent atrial fibrillation with CHA2DS2-VASc score of 3.  She remains on Eliquis for stroke prophylaxis, no spontaneous bleeding problems reported.  Continue Lopressor and amiodarone as before.  2.  Essential hypertension.  Blood pressure is reasonable today.  No changes were made.  Disposition:  Follow up  6 months.  Signed, Jonelle Sidle, M.D., F.A.C.C. [facility]

## 2023-01-28 NOTE — Patient Instructions (Addendum)

## 2023-02-05 ENCOUNTER — Encounter: Payer: Self-pay | Admitting: Gastroenterology

## 2023-02-05 ENCOUNTER — Ambulatory Visit (INDEPENDENT_AMBULATORY_CARE_PROVIDER_SITE_OTHER): Payer: Medicare Other | Admitting: Gastroenterology

## 2023-02-05 VITALS — BP 131/71 | HR 68 | Temp 97.8°F | Ht 68.0 in | Wt 239.2 lb

## 2023-02-05 DIAGNOSIS — R109 Unspecified abdominal pain: Secondary | ICD-10-CM | POA: Diagnosis not present

## 2023-02-05 DIAGNOSIS — K219 Gastro-esophageal reflux disease without esophagitis: Secondary | ICD-10-CM | POA: Diagnosis not present

## 2023-02-05 MED ORDER — HYDROCORTISONE ACETATE 25 MG RE SUPP: 25.0000 mg | Freq: Two times a day (BID) | RECTAL | 0 refills | Status: DC

## 2023-02-05 NOTE — Patient Instructions (Signed)
RX for anusol suppositories sent to your pharmacy for hemorrhoid flare. If not affordable, please call and we can send in a cream. Continue nexium 40 mg twice daily before breakfast and evening meal. Return to the office in 3 months.  Call sooner if needed. Consider scheduling colonoscopy after your next office visit.

## 2023-02-05 NOTE — Progress Notes (Signed)
GI Office Note    Referring Provider: Oneal Grout, FNP Primary Care Physician:  Oneal Grout, FNP  Primary Gastroenterologist: Roetta Sessions, MD   Chief Complaint   Chief Complaint  Patient presents with   Hemorrhoids    Was having a diverticulitis flare up, finished antibiotics pcp gave her, doing better. Has since developed a hemorrhoid. Also wanted to ask about nexium, if it is safe to be taking.     History of Present Illness   Daisy Black is a 72 y.o. female presenting today for follow up.  Last seen in January 2024.  She has a history of diverticulitis, epigastric pain, GERD, anemia, asthma, hyperlipidemia, hypertension, A-fib on Eliquis, CLL following in [facility] presenting for follow-up with recent episode of suspected diverticulitis.  Last saw her oncologist Dr.Finnegan 10/2022. States her CLL is stable.  CT as noted below.  Recently with recurrent epigastric/left upper quadrant pain.  Seen by PCP and started on empiric antibiotics for possible diverticulitis.  Completed 10-day course of Augmentin.  Patient states antibiotics pretty much to clear her symptoms.  She is doing better.  Bowel movements are pretty regular.  She has had some issues with her hemorrhoids lately.  She has some pain and itching.  She prefers Anusol suppositories over the cream.  Continues to take Nexium 40 mg twice daily, adequate control of her reflux symptoms.  Some mornings wakes up with nausea but does not last very long.  No vomiting.   Colonoscopy in August 2019 with internal hemorrhoids, sigmoid and descending diverticulosis, and 3 polyps at the ileocecal valve.  Due for surveillance in 2024.   EGD August 2023: -Mild Schatzki's ring s/p dilation -Gastric erosions of uncertain significance s/p biopsy revealing antral mucosa with focal erosion and reactive changes, no H. Pylori -Normal duodenum -Reported lansoprazole not helping as much as pantoprazole (which also was  not controlling her reflux symptoms)    CT chest abdomen pelvis with contrast April 24: IMPRESSION: 1. Stable adenopathy above and below the diaphragm with similar splenomegaly. 2. Colonic diverticulosis without findings of acute diverticulitis. 3.  Aortic Atherosclerosis (ICD10-I70.0).  Medications   Current Outpatient Medications  Medication Sig Dispense Refill   acetaminophen (TYLENOL) 650 MG CR tablet Take 650-1,300 mg by mouth daily as needed for pain.     amiodarone (PACERONE) 200 MG tablet Take 1 tablet (200 mg total) by mouth daily. 90 tablet 1   apixaban (ELIQUIS) 5 MG TABS tablet Take 1 tablet (5 mg total) by mouth 2 (two) times daily. 180 tablet 3   Chlorpheniramine Maleate (CHLOR-TABLETS PO) Take 2 tablets by mouth daily as needed (allergies).     docusate sodium (COLACE) 100 MG capsule Take 200 mg by mouth every morning.     esomeprazole (NEXIUM) 40 MG capsule Take 1 capsule (40 mg total) by mouth 2 (two) times daily before a meal. 180 capsule 1   furosemide (LASIX) 20 MG tablet TAKE TWO TABLETS BY MOUTH DAILY AS NEEDED FOR FLUID 180 tablet 1   HYDROcodone-acetaminophen (NORCO/VICODIN) 5-325 MG tablet Take 1 tablet by mouth every 6 (six) hours as needed. 30 tablet 0   levothyroxine (SYNTHROID, LEVOTHROID) 125 MCG tablet Take 125 mcg by mouth daily before breakfast.     metoprolol tartrate (LOPRESSOR) 25 MG tablet TAKE ONE TABLET BY MOUTH TWO TIMES A DAY 180 tablet 2   promethazine (PHENERGAN) 25 MG tablet Take 1 tablet (25 mg total) by mouth every 6 (six) hours  as needed for nausea or vomiting. 60 tablet 1   sucralfate (CARAFATE) 1 g tablet Take 1 tablet (1 g total) by mouth 4 (four) times daily -  with meals and at bedtime. 120 tablet 1   traMADol (ULTRAM) 50 MG tablet Take 1 tablet (50 mg total) by mouth every 6 (six) hours as needed for severe pain. 10 tablet 0   No current facility-administered medications for this visit.   Facility-Administered Medications Ordered in  Other Visits  Medication Dose Route Frequency Provider Last Rate Last Admin   hydrocortisone cream 1 % 1 application  1 application  Topical TID PRN Jonelle Sidle, MD        Allergies   Allergies as of 02/05/2023 - Review Complete 02/05/2023  Allergen Reaction Noted   Flagyl [metronidazole] Other (See Comments) 05/01/2019         Review of Systems   General: Negative for anorexia, weight loss, fever, chills, fatigue, weakness. ENT: Negative for hoarseness, difficulty swallowing , nasal congestion. CV: Negative for chest pain, angina, palpitations, dyspnea on exertion, peripheral edema.  Respiratory: Negative for dyspnea at rest, dyspnea on exertion, cough, sputum, wheezing.  GI: See history of present illness. GU:  Negative for dysuria, hematuria, urinary incontinence, urinary frequency, nocturnal urination.  Endo: Negative for unusual weight change.     Physical Exam   BP 131/71 (BP Location: Right Arm, Patient Position: Sitting, Cuff Size: Normal)   Pulse 68   Temp 97.8 F (36.6 C) (Oral)   Ht 5\' 8"  (1.727 m)   Wt 239 lb 3.2 oz (108.5 kg)   SpO2 96%   BMI 36.37 kg/m    General: Well-nourished, well-developed in no acute distress.  Eyes: No icterus. Mouth: Oropharyngeal mucosa moist and pink   Abdomen: Bowel sounds are normal, nondistended, no hepatosplenomegaly or masses,  no abdominal bruits, no rebound or guarding. Mild epigastric tenderness. Rectal: not performed Extremities: No lower extremity edema. No clubbing or deformities. Neuro: Alert and oriented x 4   Skin: Warm and dry, no jaundice.   Psych: Alert and cooperative, normal mood and affect.  Labs   Lab Results  Component Value Date   ALT 22 11/20/2022   AST 27 11/20/2022   ALKPHOS 111 11/20/2022   BILITOT 0.6 11/20/2022   Lab Results  Component Value Date   WBC 30.3 (H) 11/20/2022   HGB 12.7 11/20/2022   HCT 41.9 11/20/2022   MCV 97.2 11/20/2022   PLT 135 (L) 11/20/2022   Lab Results   Component Value Date   TSH 1.987 11/17/2021   Lab Results  Component Value Date   CREATININE 0.85 11/20/2022   BUN 19 11/20/2022   NA 143 11/20/2022   K 4.1 11/20/2022   CL 108 11/20/2022   CO2 29 11/20/2022    Imaging Studies   No results found.  Assessment   GERD/epigastric pain/nausea: Overall doing well at this time.  Continues Nexium 40 mg twice daily.  She continues to do well over the next few months, could consider decreasing to once daily if tolerated.  Left-sided abdominal pain: Recently treated empirically for diverticulitis per PCP.,  Vantin twice daily for 10 days.  Doing better.  History of adenomatous colon polyps: She is due for surveillance colonoscopy in August.  Given recent possible diverticulitis, would wait at least 4 weeks prior to considering.  She would like to postpone a little further into the fall.  PLAN   Continue Nexium 40 mg twice daily for now.  Consider decreasing to once daily dosing after next visit if she continues to do well. Prescription for Anusol suppositories sent to her pharmacy for hemorrhoid flares as needed. Return to the office in 3 months or call sooner if needed.  Consider scheduling colonoscopy after her next visit.   Leanna Battles. Melvyn Neth, MHS, PA-C [facility]

## 2023-02-06 ENCOUNTER — Other Ambulatory Visit: Payer: Self-pay | Admitting: Gastroenterology

## 2023-02-06 DIAGNOSIS — K219 Gastro-esophageal reflux disease without esophagitis: Secondary | ICD-10-CM

## 2023-02-06 DIAGNOSIS — R1013 Epigastric pain: Secondary | ICD-10-CM

## 2023-03-12 ENCOUNTER — Other Ambulatory Visit: Payer: Self-pay | Admitting: Cardiology

## 2023-03-12 ENCOUNTER — Encounter: Payer: Self-pay | Admitting: *Deleted

## 2023-05-07 ENCOUNTER — Ambulatory Visit: Payer: Medicare Other | Admitting: Gastroenterology

## 2023-05-23 ENCOUNTER — Ambulatory Visit
Admission: RE | Admit: 2023-05-23 | Discharge: 2023-05-23 | Disposition: A | Discharge status: Home / Self Care | Payer: Medicare Other | Source: Ambulatory Visit | Attending: Oncology | Admitting: Oncology

## 2023-05-23 ENCOUNTER — Inpatient Hospital Stay: Payer: Medicare Other | Attending: Oncology

## 2023-05-23 DIAGNOSIS — C9110 Chronic lymphocytic leukemia of B-cell type not having achieved remission: Secondary | ICD-10-CM | POA: Insufficient documentation

## 2023-05-23 DIAGNOSIS — D696 Thrombocytopenia, unspecified: Secondary | ICD-10-CM | POA: Insufficient documentation

## 2023-05-23 DIAGNOSIS — R161 Splenomegaly, not elsewhere classified: Secondary | ICD-10-CM | POA: Diagnosis not present

## 2023-05-23 LAB — COMPREHENSIVE METABOLIC PANEL
ALT: 15 U/L (ref 0–44)
AST: 20 U/L (ref 15–41)
Albumin: 4 g/dL (ref 3.5–5.0)
Alkaline Phosphatase: 87 U/L (ref 38–126)
Anion gap: 5 (ref 5–15)
BUN: 22 mg/dL (ref 8–23)
CO2: 27 mmol/L (ref 22–32)
Calcium: 9.6 mg/dL (ref 8.9–10.3)
Chloride: 109 mmol/L (ref 98–111)
Creatinine, Ser: 0.87 mg/dL (ref 0.44–1.00)
GFR, Estimated: >60 mL/min (ref >60)
Glucose, Bld: 96 mg/dL (ref 70–99)
Potassium: 4.3 mmol/L (ref 3.5–5.1)
Sodium: 141 mmol/L (ref 135–145)
Total Bilirubin: 0.6 mg/dL (ref 0.3–1.2)
Total Protein: 6.2 g/dL — ABNORMAL LOW (ref 6.5–8.1)

## 2023-05-23 LAB — CBC WITH DIFFERENTIAL/PLATELET
Abs Immature Granulocytes: 0.1 10*3/uL — ABNORMAL HIGH (ref 0.00–0.07)
Basophils Absolute: 0.2 10*3/uL — ABNORMAL HIGH (ref 0.0–0.1)
Basophils Relative: 0 %
Eosinophils Absolute: 0.2 10*3/uL (ref 0.0–0.5)
Eosinophils Relative: 1 %
HCT: 40.6 % (ref 36.0–46.0)
Hemoglobin: 12.7 g/dL (ref 12.0–15.0)
Immature Granulocytes: 0 %
Lymphocytes Relative: 84 %
Lymphs Abs: 32 10*3/uL — ABNORMAL HIGH (ref 0.7–4.0)
MCH: 30.5 pg (ref 26.0–34.0)
MCHC: 31.3 g/dL (ref 30.0–36.0)
MCV: 97.4 fL (ref 80.0–100.0)
Monocytes Absolute: 2.3 10*3/uL — ABNORMAL HIGH (ref 0.1–1.0)
Monocytes Relative: 6 %
Neutro Abs: 3.3 10*3/uL (ref 1.7–7.7)
Neutrophils Relative %: 9 %
Platelets: 96 10*3/uL — ABNORMAL LOW (ref 150–400)
RBC: 4.17 MIL/uL (ref 3.87–5.11)
RDW: 15 % (ref 11.5–15.5)
Smear Review: NORMAL
WBC: 38.1 10*3/uL — ABNORMAL HIGH (ref 4.0–10.5)
nRBC: 0 % (ref 0.0–0.2)

## 2023-05-23 MED ORDER — IOHEXOL 300 MG/ML  SOLN
100.0000 mL | Freq: Once | INTRAMUSCULAR | Status: AC | PRN
  Administered 2023-05-23: 100 mL via INTRAVENOUS

## 2023-05-28 ENCOUNTER — Encounter: Payer: Self-pay | Admitting: Oncology

## 2023-05-28 ENCOUNTER — Other Ambulatory Visit: Payer: Medicare Other

## 2023-05-28 ENCOUNTER — Inpatient Hospital Stay (HOSPITAL_BASED_OUTPATIENT_CLINIC_OR_DEPARTMENT_OTHER): Payer: Medicare Other | Admitting: Oncology

## 2023-05-28 ENCOUNTER — Other Ambulatory Visit: Payer: Self-pay | Admitting: *Deleted

## 2023-05-28 VITALS — BP 147/55 | HR 69 | Temp 96.4°F | Resp 16 | Ht 68.0 in | Wt 242.8 lb

## 2023-05-28 DIAGNOSIS — C9110 Chronic lymphocytic leukemia of B-cell type not having achieved remission: Secondary | ICD-10-CM

## 2023-05-28 MED ORDER — HYDROCODONE-ACETAMINOPHEN 5-325 MG PO TABS: 1.0000 | ORAL_TABLET | Freq: Four times a day (QID) | ORAL | 0 refills | Status: DC | PRN

## 2023-05-28 NOTE — Progress Notes (Signed)
[facility]  [phone]) [phone] [phone]  ID: Lenoria Farrier OB: 11/16/1950  MR#: [id number]  RKY#:[id number]  Patient Care Team: Oneal Grout, FNP as PCP - General (Family Medicine) Jonelle Sidle, MD as PCP - Cardiology (Cardiology) Franky Macho, MD (General Surgery) Jim Like, RN as Registered Nurse Scarlett Presto, RN (Inactive) as Registered Nurse Jonelle Sidle, MD as Consulting Physician (Cardiology) Corbin Ade, MD as Consulting Physician (Gastroenterology) Jeralyn Ruths, MD as Consulting Physician (Hematology and Oncology) Oneal Grout, FNP (Family Medicine)  CHIEF COMPLAINT: CLL  INTERVAL HISTORY: Patient returns to clinic today for repeat laboratory work, further evaluation, discussion of her imaging results.  She has some mildly tender lymph nodes in her neck.  She has chronic fatigue as well as interim left flank pain. She has no neurologic complaints.  She denies any fevers, chills, or night sweats.  She has no chest pain, shortness of breath, cough, or hemoptysis. She denies any nausea, vomiting, constipation, or diarrhea.  She has no urinary complaints.  Patient offers no further specific complaints today.  REVIEW OF SYSTEMS:   Review of Systems  Constitutional:  Positive for malaise/fatigue. Negative for diaphoresis, fever and weight loss.  Respiratory: Negative.  Negative for cough and shortness of breath.   Cardiovascular: Negative.  Negative for chest pain and leg swelling.  Gastrointestinal: Negative.  Negative for abdominal pain, blood in stool, constipation, diarrhea, heartburn, melena, nausea and vomiting.  Genitourinary:  Positive for flank pain.  Musculoskeletal:  Negative for back pain and joint pain.  Skin: Negative.  Negative for rash.  Neurological: Negative.  Negative for dizziness, sensory change, focal weakness, weakness and headaches.  Psychiatric/Behavioral: Negative.  The patient is  not nervous/anxious.     As per HPI. Otherwise, a complete review of systems is negative.  PAST MEDICAL HISTORY: Past Medical History:  Diagnosis Date   Anemia    Arthritis    Asthma    Atrial fibrillation (HCC)    CLL (chronic lymphocytic leukemia) (HCC)    Essential hypertension    GERD (gastroesophageal reflux disease)    History of hiatal hernia    Hyperlipidemia    Paroxysmal atrial fibrillation (HCC)    Splenomegaly     PAST SURGICAL HISTORY: Past Surgical History:  Procedure Laterality Date   ANKLE SURGERY Right    repair fracture   BIOPSY  02/28/2022   Procedure: BIOPSY;  Surgeon: Corbin Ade, MD;  [facility];  Service: Endoscopy;;  gastric   CARDIOVERSION N/A 03/20/2016   Procedure: CARDIOVERSION;  Surgeon: Jonelle Sidle, MD;  Location: [facility];  Service: Cardiovascular;  Laterality: N/A;   CARDIOVERSION N/A 05/22/2016   Procedure: CARDIOVERSION;  Surgeon: Jonelle Sidle, MD;  Location: [facility];  Service: Cardiovascular;  Laterality: N/A;   CARDIOVERSION N/A 11/20/2021   Procedure: CARDIOVERSION;  Surgeon: Christell Constant, MD;  Location: [facility];  Service: Cardiovascular;  Laterality: N/A;   CARDIOVERSION N/A 11/24/2021   Procedure: CARDIOVERSION;  Surgeon: Jonelle Sidle, MD;  Location: [facility];  Service: Cardiovascular;  Laterality: N/A;   CHOLECYSTECTOMY     COLONOSCOPY WITH PROPOFOL N/A 03/27/2018   Internal hemorrhoids, diverticulosis in sigmoid and descending colon, three 4-6 mm polyps at IC valve. tubular adenomas 5 year surveillance   ESOPHAGOGASTRODUODENOSCOPY (EGD) WITH PROPOFOL N/A 03/27/2018   Moderate Schatzki's ring s/p dilation, small hiatal hernia   ESOPHAGOGASTRODUODENOSCOPY (EGD) WITH PROPOFOL N/A 02/28/2022   Procedure: ESOPHAGOGASTRODUODENOSCOPY (EGD) WITH  PROPOFOL;  Surgeon: Corbin Ade, MD;  [facility];  Service: Endoscopy;  Laterality: N/A;  8:15am   HERNIA REPAIR  2011   Incisional and umbilical  utilizing mesh   MALONEY DILATION N/A 03/27/2018   Procedure: Elease Hashimoto DILATION;  Surgeon: Corbin Ade, MD;  [facility];  Service: Endoscopy;  Laterality: N/A;   MALONEY DILATION N/A 02/28/2022   Procedure: Elease Hashimoto DILATION;  Surgeon: Corbin Ade, MD;  [facility];  Service: Endoscopy;  Laterality: N/A;   POLYPECTOMY  03/27/2018   Procedure: POLYPECTOMY;  Surgeon: Corbin Ade, MD;  [facility];  Service: Endoscopy;;  colon   ROTATOR CUFF REPAIR Left    TEE WITHOUT CARDIOVERSION N/A 01/17/2016   Procedure: TRANSESOPHAGEAL ECHOCARDIOGRAM (TEE);  Surgeon: Laqueta Linden, MD;  Location: [facility];  Service: Cardiovascular;  Laterality: N/A;   TEE WITHOUT CARDIOVERSION N/A 02/21/2016   Procedure: TRANSESOPHAGEAL ECHOCARDIOGRAM (TEE) WITH PROPOFOL;  Surgeon: Laqueta Linden, MD;  Location: [facility];  Service: Cardiovascular;  Laterality: N/A;   TEE WITHOUT CARDIOVERSION N/A 03/20/2016   Procedure: TRANSESOPHAGEAL ECHOCARDIOGRAM (TEE) WITH PROPOFOL;  Surgeon: Jonelle Sidle, MD;  Location: [facility];  Service: Cardiovascular;  Laterality: N/A;   TONSILLECTOMY     TOTAL KNEE ARTHROPLASTY  2022    FAMILY HISTORY Family History  Problem Relation Age of Onset   Ovarian cancer Mother 25       Secondary to ovarian cancer   Leukemia Mother    Stroke Father 39       Brain stem infarction   Breast cancer Paternal Aunt    Crohn's disease Son    Colon cancer Neg Hx        ADVANCED DIRECTIVES:    HEALTH MAINTENANCE: Social History   Tobacco Use   Smoking status: Former    Current packs/day: 0.00    Average packs/day: 0.3 packs/day for 2.0 years (0.5 ttl pk-yrs)    Types: Cigarettes    Start date: 08/02/1973    Quit date: 08/03/1975    Years since quitting: 47.8   Smokeless tobacco: Never  Vaping Use   Vaping status: Never Used  Substance Use Topics   Alcohol use: No    Alcohol/week: 0.0 standard drinks of alcohol   Drug use: No      Colonoscopy:  PAP:  Bone density:  Lipid panel:  Allergies  Allergen Reactions   Flagyl [Metronidazole] Other (See Comments)    Classic side effect of severe nausea, unable to tolerate.     Current Outpatient Medications  Medication Sig Dispense Refill   acetaminophen (TYLENOL) 650 MG CR tablet Take 650-1,300 mg by mouth daily as needed for pain.     amiodarone (PACERONE) 200 MG tablet Take 1 tablet (200 mg total) by mouth daily. 90 tablet 1   apixaban (ELIQUIS) 5 MG TABS tablet Take 1 tablet (5 mg total) by mouth 2 (two) times daily. 180 tablet 3   Chlorpheniramine Maleate (CHLOR-TABLETS PO) Take 2 tablets by mouth daily as needed (allergies).     docusate sodium (COLACE) 100 MG capsule Take 200 mg by mouth every morning.     esomeprazole (NEXIUM) 40 MG capsule TAKE 1 CAPSULE (40 MG TOTAL) BY MOUTH 2 (TWO) TIMES DAILY BEFORE A MEAL. 180 capsule 1   furosemide (LASIX) 20 MG tablet TAKE TWO TABLETS BY MOUTH DAILY AS NEEDED FOR FLUID 180 tablet 1   hydrocortisone (ANUSOL-HC) 25 MG suppository Place 1 suppository (25 mg  total) rectally 2 (two) times daily. 24 suppository 0   levothyroxine (SYNTHROID, LEVOTHROID) 125 MCG tablet Take 125 mcg by mouth daily before breakfast.     metoprolol tartrate (LOPRESSOR) 25 MG tablet TAKE ONE TABLET BY MOUTH TWO TIMES A DAY 180 tablet 2   promethazine (PHENERGAN) 25 MG tablet Take 1 tablet (25 mg total) by mouth every 6 (six) hours as needed for nausea or vomiting. 60 tablet 1   sucralfate (CARAFATE) 1 g tablet Take 1 tablet (1 g total) by mouth 4 (four) times daily -  with meals and at bedtime. 120 tablet 1   traMADol (ULTRAM) 50 MG tablet Take 1 tablet (50 mg total) by mouth every 6 (six) hours as needed for severe pain. 10 tablet 0   HYDROcodone-acetaminophen (NORCO/VICODIN) 5-325 MG tablet Take 1 tablet by mouth every 6 (six) hours as needed. 30 tablet 0   No current facility-administered medications for this visit.   Facility-Administered  Medications Ordered in Other Visits  Medication Dose Route Frequency Provider Last Rate Last Admin   hydrocortisone cream 1 % 1 application  1 application  Topical TID PRN Jonelle Sidle, MD        OBJECTIVE: Vitals:   05/28/23 1113  BP: (!) 147/55  Pulse: 69  Resp: 16  Temp: (!) 96.4 F (35.8 C)  SpO2: 100%     Body mass index is 36.92 kg/m.    ECOG FS:0 - Asymptomatic  General: Well-developed, well-nourished, no acute distress. Eyes: Pink conjunctiva, anicteric sclera. HEENT: Normocephalic, moist mucous membranes.  Minimally palpable bilateral cervical lymphadenopathy. Lungs: No audible wheezing or coughing. Heart: Regular rate and rhythm. Abdomen: Soft, nontender, no obvious distention. Musculoskeletal: No edema, cyanosis, or clubbing. Neuro: Alert, answering all questions appropriately. Cranial nerves grossly intact. Skin: No rashes or petechiae noted. Psych: Normal affect.  LAB RESULTS:  Lab Results  Component Value Date   NA 141 05/23/2023   K 4.3 05/23/2023   CL 109 05/23/2023   CO2 27 05/23/2023   GLUCOSE 96 05/23/2023   BUN 22 05/23/2023   CREATININE 0.87 05/23/2023   CALCIUM 9.6 05/23/2023   PROT 6.2 (L) 05/23/2023   ALBUMIN 4.0 05/23/2023   AST 20 05/23/2023   ALT 15 05/23/2023   ALKPHOS 87 05/23/2023   BILITOT 0.6 05/23/2023   GFRNONAA >60 05/23/2023   GFRAA >60 03/09/2020    Lab Results  Component Value Date   WBC 38.1 (H) 05/23/2023   NEUTROABS 3.3 05/23/2023   HGB 12.7 05/23/2023   HCT 40.6 05/23/2023   MCV 97.4 05/23/2023   PLT 96 (L) 05/23/2023     STUDIES: CT CHEST ABDOMEN PELVIS W CONTRAST  Result Date: 05/23/2023 CLINICAL DATA:  History of chronic lymphocytic leukemia, monitor. * Tracking Code: BO * EXAM: CT CHEST, ABDOMEN, AND PELVIS WITH CONTRAST TECHNIQUE: Multidetector CT imaging of the chest, abdomen and pelvis was performed following the standard protocol during bolus administration of intravenous contrast. RADIATION DOSE  REDUCTION: This exam was performed according to the departmental dose-optimization program which includes automated exposure control, adjustment of the mA and/or kV according to patient size and/or use of iterative reconstruction technique. CONTRAST:  OMNIPAQUE IOHEXOL 300 MG/ML  SOLN COMPARISON:  Multiple priors including most recent CT November 09, 2022 FINDINGS: CT CHEST FINDINGS Cardiovascular: Normal caliber thoracic aorta. No central pulmonary embolus on this nondedicated study. Normal size heart. No significant pericardial effusion/thickening. Mediastinum/Nodes: No significant interval change in the bilateral lower cervical/supraclavicular, mediastinal and axillary lymph nodes. Previously  indexed lymph nodes are as follows: -right lower cervical lymph node measures 7 mm in short axis on image 3/2, unchanged. -right axillary lymph node measures 2.2 cm in short axis on image 23/2, unchanged. -subcarinal lymph node measures 14 mm in short axis on image 30/2 previously 15 mm. Multiple thyroid nodules are again seen measuring up to 14 mm common not clinically significant and requiring no independent imaging follow-up. The esophagus is grossly unremarkable. Lungs/Pleura: No suspicious pulmonary nodules or masses. No pleural effusion. No pneumothorax. Musculoskeletal: No aggressive lytic or blastic lesion of bone. CT ABDOMEN PELVIS FINDINGS Hepatobiliary: No suspicious hepatic lesion. Gallbladder surgically absent. No biliary ductal dilation. Pancreas: No pancreatic ductal dilation or evidence of acute inflammation. Spleen: Similar splenomegaly measuring 17.2 cm in maximum craniocaudal dimension previously 17.1. Adrenals/Urinary Tract: Bilateral adrenal glands are within normal limits. No hydronephrosis. Kidneys demonstrate symmetric enhancement. Urinary bladder is unremarkable for degree of distension. Stomach/Bowel: No radiopaque enteric contrast material was administered. Stomach is unremarkable for degree of  distension. No pathologic dilation of small or large bowel. Normal appendix. Colonic diverticulosis without findings of acute diverticulitis. Vascular/Lymphatic: Normal caliber abdominal aorta. The portal, splenic and superior mesenteric veins are patent. Stable abdominopelvic adenopathy. Indexed lymph nodes are as follows: -portacaval lymph node measures 14 mm in short axis on image 58/2, unchanged. -gastrohepatic ligament lymph node measures 13 mm in short axis on image 52/2 previously 14 mm. -right external iliac lymph node measures 17 mm in short axis on image 107/2, unchanged. -left external iliac lymph node measures 15 mm in short axis on image 105/2, unchanged. Reproductive: Uterus and bilateral adnexa are unremarkable. Other: No significant abdominopelvic free fluid. Musculoskeletal: No aggressive lytic or blastic lesion of bone. IMPRESSION: 1. Stable adenopathy in the chest, abdomen and pelvis. 2. Similar splenomegaly. 3. Colonic diverticulosis without findings of acute diverticulitis. Electronically Signed   By: Maudry Mayhew M.D.   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  PLAN:    CLL: Patient completed her second round of weekly single agent Rituxan x4 on August 20, 2019.  Her most recent CT scan on May 23, 2023 reviewed independently and report as above with essentially unchanged lymphadenopathy and splenomegaly.  Her white blood cell count has trended up slightly to 38.1.  No intervention is needed at this time.  If treatment is indicated, patient has indicated that she would prefer Imbruvica or zanubrutinib rather than repeat Rituxan.  Return to clinic in 6 months with repeat imaging, laboratory work, and further evaluation.   Flank pain: Chronic and unchanged.  Likely secondary to splenomegaly.  Patient was given a prescription for Vicodin today. Atrial fibrillation: Continue monitoring and treatment per cardiology. Iron  deficiency anemia: Resolved.  She last received IV Feraheme in May 2017.  Thrombocytopenia: Patient's platelet count has trended down slightly to 96, monitor.  Patient is known splenomegaly is likely contributing. PET positive thyroid lesions: 1.2 cm right lobe nodule also seen on thyroid ultrasound April 06, 2016.  No change on recent CT scan.  Continue follow-up with ENT as indicated.  Patient expressed understanding and was in agreement with this plan. She also understands that She can call clinic at any time with any questions, concerns, or complaints.     Jeralyn Ruths, MD   05/28/2023 1:44 PM

## 2023-05-28 NOTE — Progress Notes (Signed)
Having pain in LN on left side of neck. Has noticed more swelling in that area as well.

## 2023-06-10 ENCOUNTER — Ambulatory Visit: Payer: Medicare Other | Admitting: Gastroenterology

## 2023-06-10 NOTE — Progress Notes (Deleted)
GI Office Note    Referring Provider: Oneal Grout, FNP Primary Care Physician:  Oneal Grout, FNP  Primary Gastroenterologist: Roetta Sessions, MD   Chief Complaint   No chief complaint on file.   History of Present Illness   Daisy Black is a 72 y.o. female presenting today for follow up. Last seen in 01/2023. H/o diverticulitis, epigastric pain, GERD, anemia, asthma, hyperlipidemia, hypertension, A-fib on Eliquis, CLL following in [facility].  Colonoscopy in August 2019 with internal hemorrhoids, sigmoid and descending diverticulosis, and 3 polyps at the ileocecal valve.  Due for surveillance in 2024.   EGD August 2023: -Mild Schatzki's ring s/p dilation -Gastric erosions of uncertain significance s/p biopsy revealing antral mucosa with focal erosion and reactive changes, no H. Pylori -Normal duodenum -Reported lansoprazole not helping as much as pantoprazole (which also was not controlling her reflux symptoms)    CT chest abdomen pelvis with contrast April 24: IMPRESSION: 1. Stable adenopathy above and below the diaphragm with similar splenomegaly. 2. Colonic diverticulosis without findings of acute diverticulitis. 3.  Aortic Atherosclerosis (ICD10-I70.0).        Medications   Current Outpatient Medications  Medication Sig Dispense Refill   acetaminophen (TYLENOL) 650 MG CR tablet Take 650-1,300 mg by mouth daily as needed for pain.     amiodarone (PACERONE) 200 MG tablet Take 1 tablet (200 mg total) by mouth daily. 90 tablet 1   apixaban (ELIQUIS) 5 MG TABS tablet Take 1 tablet (5 mg total) by mouth 2 (two) times daily. 180 tablet 3   Chlorpheniramine Maleate (CHLOR-TABLETS PO) Take 2 tablets by mouth daily as needed (allergies).     docusate sodium (COLACE) 100 MG capsule Take 200 mg by mouth every morning.     esomeprazole (NEXIUM) 40 MG capsule TAKE 1 CAPSULE (40 MG TOTAL) BY MOUTH 2 (TWO) TIMES DAILY BEFORE A MEAL. 180 capsule 1    furosemide (LASIX) 20 MG tablet TAKE TWO TABLETS BY MOUTH DAILY AS NEEDED FOR FLUID 180 tablet 1   HYDROcodone-acetaminophen (NORCO/VICODIN) 5-325 MG tablet Take 1 tablet by mouth every 6 (six) hours as needed. 30 tablet 0   hydrocortisone (ANUSOL-HC) 25 MG suppository Place 1 suppository (25 mg total) rectally 2 (two) times daily. 24 suppository 0   levothyroxine (SYNTHROID, LEVOTHROID) 125 MCG tablet Take 125 mcg by mouth daily before breakfast.     metoprolol tartrate (LOPRESSOR) 25 MG tablet TAKE ONE TABLET BY MOUTH TWO TIMES A DAY 180 tablet 2   promethazine (PHENERGAN) 25 MG tablet Take 1 tablet (25 mg total) by mouth every 6 (six) hours as needed for nausea or vomiting. 60 tablet 1   sucralfate (CARAFATE) 1 g tablet Take 1 tablet (1 g total) by mouth 4 (four) times daily -  with meals and at bedtime. 120 tablet 1   traMADol (ULTRAM) 50 MG tablet Take 1 tablet (50 mg total) by mouth every 6 (six) hours as needed for severe pain. 10 tablet 0   No current facility-administered medications for this visit.   Facility-Administered Medications Ordered in Other Visits  Medication Dose Route Frequency Provider Last Rate Last Admin   hydrocortisone cream 1 % 1 application  1 application  Topical TID PRN Jonelle Sidle, MD        Allergies   Allergies as of 06/10/2023 - Review Complete 05/28/2023  Allergen Reaction Noted   Flagyl [metronidazole] Other (See Comments) 05/01/2019     Past Medical History  Past Medical History:  Diagnosis Date   Anemia    Arthritis    Asthma    Atrial fibrillation (HCC)    CLL (chronic lymphocytic leukemia) (HCC)    Essential hypertension    GERD (gastroesophageal reflux disease)    History of hiatal hernia    Hyperlipidemia    Paroxysmal atrial fibrillation (HCC)    Splenomegaly     Past Surgical History   Past Surgical History:  Procedure Laterality Date   ANKLE SURGERY Right    repair fracture   BIOPSY  02/28/2022   Procedure: BIOPSY;   Surgeon: Corbin Ade, MD;  [facility];  Service: Endoscopy;;  gastric   CARDIOVERSION N/A 03/20/2016   Procedure: CARDIOVERSION;  Surgeon: Jonelle Sidle, MD;  Location: [facility];  Service: Cardiovascular;  Laterality: N/A;   CARDIOVERSION N/A 05/22/2016   Procedure: CARDIOVERSION;  Surgeon: Jonelle Sidle, MD;  Location: [facility];  Service: Cardiovascular;  Laterality: N/A;   CARDIOVERSION N/A 11/20/2021   Procedure: CARDIOVERSION;  Surgeon: Christell Constant, MD;  Location: [facility];  Service: Cardiovascular;  Laterality: N/A;   CARDIOVERSION N/A 11/24/2021   Procedure: CARDIOVERSION;  Surgeon: Jonelle Sidle, MD;  Location: [facility];  Service: Cardiovascular;  Laterality: N/A;   CHOLECYSTECTOMY     COLONOSCOPY WITH PROPOFOL N/A 03/27/2018   Internal hemorrhoids, diverticulosis in sigmoid and descending colon, three 4-6 mm polyps at IC valve. tubular adenomas 5 year surveillance   ESOPHAGOGASTRODUODENOSCOPY (EGD) WITH PROPOFOL N/A 03/27/2018   Moderate Schatzki's ring s/p dilation, small hiatal hernia   ESOPHAGOGASTRODUODENOSCOPY (EGD) WITH PROPOFOL N/A 02/28/2022   Procedure: ESOPHAGOGASTRODUODENOSCOPY (EGD) WITH PROPOFOL;  Surgeon: Corbin Ade, MD;  [facility];  Service: Endoscopy;  Laterality: N/A;  8:15am   HERNIA REPAIR  2011   Incisional and umbilical utilizing mesh   MALONEY DILATION N/A 03/27/2018   Procedure: Elease Hashimoto DILATION;  Surgeon: Corbin Ade, MD;  [facility];  Service: Endoscopy;  Laterality: N/A;   MALONEY DILATION N/A 02/28/2022   Procedure: Elease Hashimoto DILATION;  Surgeon: Corbin Ade, MD;  [facility];  Service: Endoscopy;  Laterality: N/A;   POLYPECTOMY  03/27/2018   Procedure: POLYPECTOMY;  Surgeon: Corbin Ade, MD;  [facility];  Service: Endoscopy;;  colon   ROTATOR CUFF REPAIR Left    TEE WITHOUT CARDIOVERSION N/A 01/17/2016   Procedure: TRANSESOPHAGEAL ECHOCARDIOGRAM (TEE);  Surgeon:  Laqueta Linden, MD;  Location: [facility];  Service: Cardiovascular;  Laterality: N/A;   TEE WITHOUT CARDIOVERSION N/A 02/21/2016   Procedure: TRANSESOPHAGEAL ECHOCARDIOGRAM (TEE) WITH PROPOFOL;  Surgeon: Laqueta Linden, MD;  Location: [facility];  Service: Cardiovascular;  Laterality: N/A;   TEE WITHOUT CARDIOVERSION N/A 03/20/2016   Procedure: TRANSESOPHAGEAL ECHOCARDIOGRAM (TEE) WITH PROPOFOL;  Surgeon: Jonelle Sidle, MD;  Location: [facility];  Service: Cardiovascular;  Laterality: N/A;   TONSILLECTOMY     TOTAL KNEE ARTHROPLASTY  2022    Past Family History   Family History  Problem Relation Age of Onset   Ovarian cancer Mother 37       Secondary to ovarian cancer   Leukemia Mother    Stroke Father 33       Brain stem infarction   Breast cancer Paternal Aunt    Crohn's disease Son    Colon cancer Neg Hx     Past Social History   Social History   Socioeconomic History   Marital status: Married  Spouse name: Not on file   Number of children: 9   Years of education: Not on file   Highest education level: Not on file  Occupational History   Not on file  Tobacco Use   Smoking status: Former    Current packs/day: 0.00    Average packs/day: 0.3 packs/day for 2.0 years (0.5 ttl pk-yrs)    Types: Cigarettes    Start date: 08/02/1973    Quit date: 08/03/1975    Years since quitting: 47.8   Smokeless tobacco: Never  Vaping Use   Vaping status: Never Used  Substance and Sexual Activity   Alcohol use: No    Alcohol/week: 0.0 standard drinks of alcohol   Drug use: No   Sexual activity: Yes    Birth control/protection: Post-menopausal  Other Topics Concern   Not on file  Social History Narrative   Not on file   Social Determinants of Health   Financial Resource Strain: Not on file  Food Insecurity: Not on file  Transportation Needs: Not on file  Physical Activity: Not on file  Stress: Not on file  Social Connections: Not on file  Intimate Partner Violence: Not  on file    Review of Systems   General: Negative for anorexia, weight loss, fever, chills, fatigue, weakness. ENT: Negative for hoarseness, difficulty swallowing , nasal congestion. CV: Negative for chest pain, angina, palpitations, dyspnea on exertion, peripheral edema.  Respiratory: Negative for dyspnea at rest, dyspnea on exertion, cough, sputum, wheezing.  GI: See history of present illness. GU:  Negative for dysuria, hematuria, urinary incontinence, urinary frequency, nocturnal urination.  Endo: Negative for unusual weight change.     Physical Exam   There were no vitals taken for this visit.   General: Well-nourished, well-developed in no acute distress.  Eyes: No icterus. Mouth: Oropharyngeal mucosa moist and pink , no lesions erythema or exudate. Lungs: Clear to auscultation bilaterally.  Heart: Regular rate and rhythm, no murmurs rubs or gallops.  Abdomen: Bowel sounds are normal, nontender, nondistended, no hepatosplenomegaly or masses,  no abdominal bruits or hernia , no rebound or guarding.  Rectal: ***  Extremities: No lower extremity edema. No clubbing or deformities. Neuro: Alert and oriented x 4   Skin: Warm and dry, no jaundice.   Psych: Alert and cooperative, normal mood and affect.  Labs   Lab Results  Component Value Date   NA 141 05/23/2023   CL 109 05/23/2023   K 4.3 05/23/2023   CO2 27 05/23/2023   BUN 22 05/23/2023   CREATININE 0.87 05/23/2023   GFRNONAA >60 05/23/2023   CALCIUM 9.6 05/23/2023   PHOS 3.3 11/22/2021   ALBUMIN 4.0 05/23/2023   GLUCOSE 96 05/23/2023   Lab Results  Component Value Date   ALT 15 05/23/2023   AST 20 05/23/2023   ALKPHOS 87 05/23/2023   BILITOT 0.6 05/23/2023   Lab Results  Component Value Date   WBC 38.1 (H) 05/23/2023   HGB 12.7 05/23/2023   HCT 40.6 05/23/2023   MCV 97.4 05/23/2023   PLT 96 (L) 05/23/2023    Imaging Studies   CT CHEST ABDOMEN PELVIS W CONTRAST  Result Date: 05/23/2023 CLINICAL  DATA:  History of chronic lymphocytic leukemia, monitor. * Tracking Code: BO * EXAM: CT CHEST, ABDOMEN, AND PELVIS WITH CONTRAST TECHNIQUE: Multidetector CT imaging of the chest, abdomen and pelvis was performed following the standard protocol during bolus administration of intravenous contrast. RADIATION DOSE REDUCTION: This exam was performed according to the departmental  dose-optimization program which includes automated exposure control, adjustment of the mA and/or kV according to patient size and/or use of iterative reconstruction technique. CONTRAST:  OMNIPAQUE IOHEXOL 300 MG/ML  SOLN COMPARISON:  Multiple priors including most recent CT November 09, 2022 FINDINGS: CT CHEST FINDINGS Cardiovascular: Normal caliber thoracic aorta. No central pulmonary embolus on this nondedicated study. Normal size heart. No significant pericardial effusion/thickening. Mediastinum/Nodes: No significant interval change in the bilateral lower cervical/supraclavicular, mediastinal and axillary lymph nodes. Previously indexed lymph nodes are as follows: -right lower cervical lymph node measures 7 mm in short axis on image 3/2, unchanged. -right axillary lymph node measures 2.2 cm in short axis on image 23/2, unchanged. -subcarinal lymph node measures 14 mm in short axis on image 30/2 previously 15 mm. Multiple thyroid nodules are again seen measuring up to 14 mm common not clinically significant and requiring no independent imaging follow-up. The esophagus is grossly unremarkable. Lungs/Pleura: No suspicious pulmonary nodules or masses. No pleural effusion. No pneumothorax. Musculoskeletal: No aggressive lytic or blastic lesion of bone. CT ABDOMEN PELVIS FINDINGS Hepatobiliary: No suspicious hepatic lesion. Gallbladder surgically absent. No biliary ductal dilation. Pancreas: No pancreatic ductal dilation or evidence of acute inflammation. Spleen: Similar splenomegaly measuring 17.2 cm in maximum craniocaudal dimension previously  17.1. Adrenals/Urinary Tract: Bilateral adrenal glands are within normal limits. No hydronephrosis. Kidneys demonstrate symmetric enhancement. Urinary bladder is unremarkable for degree of distension. Stomach/Bowel: No radiopaque enteric contrast material was administered. Stomach is unremarkable for degree of distension. No pathologic dilation of small or large bowel. Normal appendix. Colonic diverticulosis without findings of acute diverticulitis. Vascular/Lymphatic: Normal caliber abdominal aorta. The portal, splenic and superior mesenteric veins are patent. Stable abdominopelvic adenopathy. Indexed lymph nodes are as follows: -portacaval lymph node measures 14 mm in short axis on image 58/2, unchanged. -gastrohepatic ligament lymph node measures 13 mm in short axis on image 52/2 previously 14 mm. -right external iliac lymph node measures 17 mm in short axis on image 107/2, unchanged. -left external iliac lymph node measures 15 mm in short axis on image 105/2, unchanged. Reproductive: Uterus and bilateral adnexa are unremarkable. Other: No significant abdominopelvic free fluid. Musculoskeletal: No aggressive lytic or blastic lesion of bone. IMPRESSION: 1. Stable adenopathy in the chest, abdomen and pelvis. 2. Similar splenomegaly. 3. Colonic diverticulosis without findings of acute diverticulitis. Electronically Signed   By: Maudry Mayhew M.D.   On: 05/23/2023 12:55    Assessment       PLAN   ***   Leanna Battles. Melvyn Neth, MHS, PA-C [facility]

## 2023-07-03 ENCOUNTER — Other Ambulatory Visit: Payer: Self-pay | Admitting: Cardiology

## 2023-07-04 ENCOUNTER — Encounter (HOSPITAL_COMMUNITY): Payer: Self-pay | Admitting: Emergency Medicine

## 2023-07-04 ENCOUNTER — Other Ambulatory Visit: Payer: Self-pay

## 2023-07-04 ENCOUNTER — Emergency Department (HOSPITAL_COMMUNITY): Payer: Medicare Other

## 2023-07-04 ENCOUNTER — Emergency Department (HOSPITAL_COMMUNITY)
Admission: EM | Admit: 2023-07-04 | Discharge: 2023-07-04 | Disposition: A | Discharge status: Home / Self Care | Payer: Medicare Other | Attending: Emergency Medicine | Admitting: Emergency Medicine

## 2023-07-04 DIAGNOSIS — K5732 Diverticulitis of large intestine without perforation or abscess without bleeding: Secondary | ICD-10-CM | POA: Diagnosis not present

## 2023-07-04 DIAGNOSIS — I10 Essential (primary) hypertension: Secondary | ICD-10-CM | POA: Diagnosis not present

## 2023-07-04 DIAGNOSIS — Z79899 Other long term (current) drug therapy: Secondary | ICD-10-CM | POA: Insufficient documentation

## 2023-07-04 DIAGNOSIS — J45909 Unspecified asthma, uncomplicated: Secondary | ICD-10-CM | POA: Insufficient documentation

## 2023-07-04 DIAGNOSIS — Z7901 Long term (current) use of anticoagulants: Secondary | ICD-10-CM | POA: Diagnosis not present

## 2023-07-04 DIAGNOSIS — R109 Unspecified abdominal pain: Secondary | ICD-10-CM | POA: Diagnosis present

## 2023-07-04 DIAGNOSIS — K5792 Diverticulitis of intestine, part unspecified, without perforation or abscess without bleeding: Secondary | ICD-10-CM

## 2023-07-04 HISTORY — DX: Diverticulitis of intestine, part unspecified, without perforation or abscess without bleeding: K57.92

## 2023-07-04 LAB — CBC WITH DIFFERENTIAL/PLATELET
Abs Immature Granulocytes: 0 10*3/uL (ref 0.00–0.07)
Band Neutrophils: 1 %
Basophils Absolute: 0 10*3/uL (ref 0.0–0.1)
Basophils Relative: 0 %
Eosinophils Absolute: 0 10*3/uL (ref 0.0–0.5)
Eosinophils Relative: 0 %
HCT: 45.1 % (ref 36.0–46.0)
Hemoglobin: 13.6 g/dL (ref 12.0–15.0)
Lymphocytes Relative: 80 %
Lymphs Abs: 28.8 10*3/uL — ABNORMAL HIGH (ref 0.7–4.0)
MCH: 30 pg (ref 26.0–34.0)
MCHC: 30.2 g/dL (ref 30.0–36.0)
MCV: 99.6 fL (ref 80.0–100.0)
Monocytes Absolute: 0 10*3/uL — ABNORMAL LOW (ref 0.1–1.0)
Monocytes Relative: 0 %
Neutro Abs: 7.2 10*3/uL (ref 1.7–7.7)
Neutrophils Relative %: 19 %
Platelets: 101 10*3/uL — ABNORMAL LOW (ref 150–400)
RBC: 4.53 MIL/uL (ref 3.87–5.11)
RDW: 14.8 % (ref 11.5–15.5)
WBC: 36 10*3/uL — ABNORMAL HIGH (ref 4.0–10.5)
nRBC: 0 % (ref 0.0–0.2)

## 2023-07-04 LAB — COMPREHENSIVE METABOLIC PANEL
ALT: 18 U/L (ref 0–44)
AST: 18 U/L (ref 15–41)
Albumin: 4.1 g/dL (ref 3.5–5.0)
Alkaline Phosphatase: 102 U/L (ref 38–126)
Anion gap: 9 (ref 5–15)
BUN: 14 mg/dL (ref 8–23)
CO2: 24 mmol/L (ref 22–32)
Calcium: 10.2 mg/dL (ref 8.9–10.3)
Chloride: 107 mmol/L (ref 98–111)
Creatinine, Ser: 0.85 mg/dL (ref 0.44–1.00)
GFR, Estimated: >60 mL/min (ref >60)
Glucose, Bld: 87 mg/dL (ref 70–99)
Potassium: 3.5 mmol/L (ref 3.5–5.1)
Sodium: 140 mmol/L (ref 135–145)
Total Bilirubin: 1.2 mg/dL — ABNORMAL HIGH (ref <1.2)
Total Protein: 6.9 g/dL (ref 6.5–8.1)

## 2023-07-04 LAB — LIPASE, BLOOD: Lipase: 27 U/L (ref 11–51)

## 2023-07-04 MED ORDER — AMOXICILLIN-POT CLAVULANATE 875-125 MG PO TABS
1.0000 | ORAL_TABLET | Freq: Once | ORAL | Status: AC
  Administered 2023-07-04: 1 via ORAL
  Filled 2023-07-04: qty 1

## 2023-07-04 MED ORDER — AMOXICILLIN-POT CLAVULANATE 875-125 MG PO TABS: 1.0000 | ORAL_TABLET | Freq: Two times a day (BID) | ORAL | 0 refills | Status: DC

## 2023-07-04 MED ORDER — IOHEXOL 300 MG/ML  SOLN: 100.0000 mL | Freq: Once | INTRAMUSCULAR | Status: DC | PRN

## 2023-07-04 MED ORDER — FLUCONAZOLE 150 MG PO TABS: 150.0000 mg | ORAL_TABLET | Freq: Once | ORAL | 0 refills | Status: DC | PRN

## 2023-07-04 MED ORDER — IOHEXOL 350 MG/ML SOLN
85.0000 mL | Freq: Once | INTRAVENOUS | Status: AC | PRN
  Administered 2023-07-04: 85 mL via INTRAVENOUS

## 2023-07-04 MED ORDER — HYDROCODONE-ACETAMINOPHEN 5-325 MG PO TABS: 1.0000 | ORAL_TABLET | Freq: Four times a day (QID) | ORAL | 0 refills | Status: DC | PRN

## 2023-07-04 MED ORDER — FENTANYL CITRATE PF 50 MCG/ML IJ SOSY
50.0000 ug | PREFILLED_SYRINGE | Freq: Once | INTRAMUSCULAR | Status: AC
Start: 2023-07-04 — End: 2023-07-04
  Administered 2023-07-04: 50 ug via INTRAVENOUS
  Filled 2023-07-04: qty 1

## 2023-07-04 NOTE — ED Provider Notes (Signed)
[place] EMERGENCY DEPARTMENT AT [facility] Provider Note   CSN: [id number] Arrival date & time: 07/04/23  0848     History  Chief Complaint  Patient presents with   Abdominal Pain    Daisy Black is a 72 y.o. female.  HPI 72 year old female presents with abdominal pain.  She has concern for diverticulitis as she has had this before.  She has multiple comorbidities including paroxysmal A-fib, asthma, CLL and hypertension.  She states last time she had A-fib when she had diverticulitis so she did not want a wait too long.  Noticed some pain yesterday.  It was worse yesterday and not as bad this morning but still painful.  She has been nauseated without any vomiting.  No blood in the stool or diarrhea.  No chest pain.  A little bit of back pain.  Home Medications Prior to Admission medications   Medication Sig Start Date End Date Taking? Authorizing Provider  acetaminophen (TYLENOL) 650 MG CR tablet Take 650-1,300 mg by mouth daily as needed for pain.   Yes [provider]  amiodarone (PACERONE) 200 MG tablet Take 1 tablet (200 mg total) by mouth daily. 03/13/23  Yes Jonelle Sidle, MD  amoxicillin-clavulanate (AUGMENTIN) 875-125 MG tablet Take 1 tablet by mouth every 12 (twelve) hours. 07/04/23  Yes Pricilla Loveless, MD  apixaban (ELIQUIS) 5 MG TABS tablet Take 1 tablet (5 mg total) by mouth 2 (two) times daily. 01/28/23  Yes Jonelle Sidle, MD  Chlorpheniramine Maleate (CHLOR-TABLETS PO) Take 2 tablets by mouth daily as needed (allergies).   Yes [provider]  docusate sodium (COLACE) 100 MG capsule Take 200 mg by mouth every morning.   Yes [provider]  esomeprazole (NEXIUM) 40 MG capsule TAKE 1 CAPSULE (40 MG TOTAL) BY MOUTH 2 (TWO) TIMES DAILY BEFORE A MEAL. 02/06/23  Yes Tiffany Kocher, PA-C  fluconazole (DIFLUCAN) 150 MG tablet Take 1 tablet (150 mg total) by mouth once as needed for up to 1 dose (yeast infection). 07/04/23  Yes  Pricilla Loveless, MD  furosemide (LASIX) 20 MG tablet TAKE TWO TABLETS BY MOUTH DAILY AS NEEDED FOR FLUID 07/03/23  Yes Jonelle Sidle, MD  HYDROcodone-acetaminophen (NORCO/VICODIN) 5-325 MG tablet Take 1 tablet by mouth every 6 (six) hours as needed. 05/28/23  Yes Jeralyn Ruths, MD  levothyroxine (SYNTHROID, LEVOTHROID) 125 MCG tablet Take 125 mcg by mouth daily before breakfast.   Yes [provider]  metoprolol tartrate (LOPRESSOR) 25 MG tablet TAKE ONE TABLET BY MOUTH TWO TIMES A DAY 01/07/23  Yes Jonelle Sidle, MD  promethazine (PHENERGAN) 25 MG tablet Take 1 tablet (25 mg total) by mouth every 6 (six) hours as needed for nausea or vomiting. 05/10/22  Yes Jeralyn Ruths, MD  sucralfate (CARAFATE) 1 g tablet Take 1 tablet (1 g total) by mouth 4 (four) times daily -  with meals and at bedtime. 08/27/22  Yes Mahon, Frederik Schmidt, NP  hydrocortisone (ANUSOL-HC) 25 MG suppository Place 1 suppository (25 mg total) rectally 2 (two) times daily. Patient not taking: Reported on 07/04/2023 02/05/23   Tiffany Kocher, PA-C  traMADol (ULTRAM) 50 MG tablet Take 1 tablet (50 mg total) by mouth every 6 (six) hours as needed for severe pain. Patient not taking: Reported on 07/04/2023 07/14/22   Gerhard Munch, MD      Allergies    Flagyl [metronidazole]    Review of Systems   Review of Systems  Constitutional:  Negative for fever.  Cardiovascular:  Negative for chest pain.  Gastrointestinal:  Positive for abdominal pain and nausea. Negative for blood in stool, diarrhea and vomiting.    Physical Exam Updated Vital Signs BP (!) 112/54   Pulse 62   Temp 97.8 F (36.6 C) (Oral)   Resp 19   SpO2 96%  Physical Exam Vitals and nursing note reviewed.  Constitutional:      General: She is not in acute distress.    Appearance: She is well-developed. She is not ill-appearing or diaphoretic.  HENT:     Head: Normocephalic and atraumatic.  Cardiovascular:     Rate and Rhythm:  Normal rate and regular rhythm.     Heart sounds: Normal heart sounds.  Pulmonary:     Effort: Pulmonary effort is normal.     Breath sounds: Normal breath sounds.  Abdominal:     Palpations: Abdomen is soft.     Tenderness: There is generalized abdominal tenderness (worst in LLQ).  Skin:    General: Skin is warm and dry.  Neurological:     Mental Status: She is alert.     ED Results / Procedures / Treatments   Labs (all labs ordered are listed, but only abnormal results are displayed) Labs Reviewed  COMPREHENSIVE METABOLIC PANEL - Abnormal; Notable for the following components:      Result Value   Total Bilirubin 1.2 (*)    All other components within normal limits  CBC WITH DIFFERENTIAL/PLATELET - Abnormal; Notable for the following components:   WBC 36.0 (*)    Platelets 101 (*)    Lymphs Abs 28.8 (*)    Monocytes Absolute 0.0 (*)    All other components within normal limits  LIPASE, BLOOD  URINALYSIS, ROUTINE W REFLEX MICROSCOPIC    EKG None  Radiology CT ABDOMEN PELVIS W CONTRAST  Result Date: 07/04/2023 CLINICAL DATA:  Left lower quadrant abdominal pain Possible diverticulitis EXAM: CT ABDOMEN AND PELVIS WITH CONTRAST TECHNIQUE: Multidetector CT imaging of the abdomen and pelvis was performed using the standard protocol following bolus administration of intravenous contrast. RADIATION DOSE REDUCTION: This exam was performed according to the departmental dose-optimization program which includes automated exposure control, adjustment of the mA and/or kV according to patient size and/or use of iterative reconstruction technique. CONTRAST:  85mL OMNIPAQUE IOHEXOL 350 MG/ML SOLN COMPARISON:  05/23/2023 FINDINGS: Lower chest: No acute abnormality. Hepatobiliary: No focal liver abnormality is seen. Status post cholecystectomy. No biliary dilatation. Pancreas: Unremarkable. No pancreatic ductal dilatation or surrounding inflammatory changes. Spleen: Unchanged mild splenomegaly  with the spleen measuring up to 17 cm in the largest dimension. Adrenals/Urinary Tract: Unchanged mild thickening of the adrenal glands without discrete nodules. 1.0 cm simple cyst in the posterolateral cortex of the left kidney does not require dedicated imaging surveillance. Numerous subcentimeter right renal hypodensities are too small to fully characterize, however also most likely represent simple cysts and do not require dedicated imaging surveillance. Kidneys, ureters, and bladder otherwise normal. Stomach/Bowel: Fat stranding adjacent to the distal descending and proximal sigmoid colon consistent with acute diverticulitis. No evidence of perforation or abscess. Appendix is normal. Vascular/Lymphatic: Mildly enlarged right external iliac chain lymph node is not significantly changed from prior examination measuring up to 1.5 cm on short axis. Similarly enlarged left external iliac chain lymph node is also unchanged. Prominent nonenlarged lymph nodes are again seen throughout the inguinal region and retroperitoneum findings are consistent with patient's known CLL. Reproductive: Uterus and bilateral adnexa are unremarkable. Other: Unchanged  rectus sheath diastasis. Musculoskeletal: No acute or significant osseous findings. IMPRESSION: 1. Acute uncomplicated diverticulitis of the distal descending and proximal sigmoid colon. 2. Unchanged mild splenomegaly and diffuse lymphadenopathy consistent with patient's known CLL. Electronically Signed   By: Acquanetta Belling M.D.   On: 07/04/2023 13:05    Procedures Procedures    Medications Ordered in ED Medications  iohexol (OMNIPAQUE) 300 MG/ML solution 100 mL (has no administration in time range)  amoxicillin-clavulanate (AUGMENTIN) 875-125 MG per tablet 1 tablet (has no administration in time range)  fentaNYL (SUBLIMAZE) injection 50 mcg (50 mcg Intravenous Given 07/04/23 0952)  iohexol (OMNIPAQUE) 350 MG/ML injection 85 mL (85 mLs Intravenous Contrast Given  07/04/23 1131)    ED Course/ Medical Decision Making/ A&P                                 Medical Decision Making Amount and/or Complexity of Data Reviewed Labs: ordered.    Details: Chronically elevated WBC Radiology: ordered.    Details: diverticulitis  Risk Prescription drug management.   Patient did ask for 1 dose of pain.  Otherwise, has remained well-appearing and with stable vitals.  Her leukocytosis is expected due to her CLL.  Otherwise she has diverticulitis without obvious complication.  She would like to treat this at home and will be given a course of Augmentin.  She is also asking for a yeast infection treatment as she always gets yeast infections.  Will give her fluconazole.  Otherwise, I do not think she needs admission and can follow-up with PCP.  However will give return precautions.        Final Clinical Impression(s) / ED Diagnoses Final diagnoses:  Acute diverticulitis    Rx / DC Orders ED Discharge Orders          Ordered    amoxicillin-clavulanate (AUGMENTIN) 875-125 MG tablet  Every 12 hours        07/04/23 1320    fluconazole (DIFLUCAN) 150 MG tablet  Once PRN        07/04/23 1320              Pricilla Loveless, MD 07/04/23 1322

## 2023-07-04 NOTE — Discharge Instructions (Signed)
Your CT scan shows diverticulitis.  You are being given an antibiotic and need to take the entire course unless you develop side effects or allergic reaction.  Follow-up with your primary care physician.  We are also giving you a yeast infection antifungal prescription in case you develop a yeast infection.  If you develop worsening, continued, or recurrent abdominal pain, uncontrolled vomiting, fever, chest or back pain, or any other new/concerning symptoms then return to the ER for evaluation.

## 2023-07-04 NOTE — ED Triage Notes (Signed)
Pt states she might have another "diverticulitis flare up". C/o upper/mid abd pain since last night with nausea. Denies v/d. Lnbm yesterday and denies it being black or bloody. Color wnl. A/o. Nad. Denies gu changes.

## 2023-08-19 ENCOUNTER — Ambulatory Visit: Payer: Medicare Other | Admitting: Gastroenterology

## 2023-08-20 ENCOUNTER — Ambulatory Visit (HOSPITAL_COMMUNITY)
Admission: RE | Admit: 2023-08-20 | Discharge: 2023-08-20 | Disposition: A | Discharge status: Home / Self Care | Payer: Medicare Other | Source: Ambulatory Visit | Attending: Gastroenterology | Admitting: Gastroenterology

## 2023-08-20 ENCOUNTER — Ambulatory Visit (INDEPENDENT_AMBULATORY_CARE_PROVIDER_SITE_OTHER): Payer: Medicare Other | Admitting: Gastroenterology

## 2023-08-20 ENCOUNTER — Encounter: Payer: Self-pay | Admitting: Gastroenterology

## 2023-08-20 VITALS — BP 119/75 | HR 64 | Temp 97.8°F | Ht 68.0 in | Wt 235.0 lb

## 2023-08-20 DIAGNOSIS — K219 Gastro-esophageal reflux disease without esophagitis: Secondary | ICD-10-CM

## 2023-08-20 DIAGNOSIS — R109 Unspecified abdominal pain: Secondary | ICD-10-CM | POA: Insufficient documentation

## 2023-08-20 DIAGNOSIS — R1013 Epigastric pain: Secondary | ICD-10-CM | POA: Diagnosis present

## 2023-08-20 DIAGNOSIS — K5732 Diverticulitis of large intestine without perforation or abscess without bleeding: Secondary | ICD-10-CM

## 2023-08-20 DIAGNOSIS — Z860101 Personal history of adenomatous and serrated colon polyps: Secondary | ICD-10-CM

## 2023-08-20 DIAGNOSIS — K5792 Diverticulitis of intestine, part unspecified, without perforation or abscess without bleeding: Secondary | ICD-10-CM

## 2023-08-20 DIAGNOSIS — R110 Nausea: Secondary | ICD-10-CM | POA: Diagnosis not present

## 2023-08-20 MED ORDER — IOHEXOL 300 MG/ML  SOLN
100.0000 mL | Freq: Once | INTRAMUSCULAR | Status: AC | PRN
  Administered 2023-08-20: 100 mL via INTRAVENOUS

## 2023-08-20 NOTE — Progress Notes (Signed)
GI Office Note    Referring Provider: Oneal Grout, FNP Primary Care Physician:  Oneal Grout, FNP  Primary Gastroenterologist: Roetta Sessions, MD   Chief Complaint   Chief Complaint  Patient presents with   Diarrhea    Thinks she is having a diverticulitis flare    History of Present Illness   Daisy Black is a 73 y.o. female presenting today for problem visit.  She was last seen in July 2024.  She has a history of diverticulitis, epigastric pain, GERD, anemia, asthma, hyperlipidemia, hypertension, A-fib on Eliquis, CLL following at [facility].  She is overdue for surveillance colonoscopy.  Seen in the ED on 07/04/2023, diagnosed with acute diverticulitis and provided with Augmentin 7-day course. Never really got over the symptoms. Wonders if eating heavier foods over the holidays contributed. Last week had to see her PCP for worsening left sided abdominal pain. Not able to eat solid food for 2 days due to pain. Consuming liquids only. Nausea but no vomiting. Stools 1-2 daily more loose than usual. No melena or rectal bleeding.  No fever.  She is not sure how much of her pain or discomfort is related to enlarged spleen.  States typical reflux symptoms well-controlled.  No dysphagia.   CT abdomen pelvis with 07/04/2023: IMPRESSION: 1. Acute uncomplicated diverticulitis of the distal descending and proximal sigmoid colon. 2. Unchanged mild splenomegaly and diffuse lymphadenopathy consistent with patient's known CLL.  CT chest/abdomen/pelvis with contrast 05/23/2023: IMPRESSION: 1. Stable adenopathy in the chest, abdomen and pelvis. 2. Similar splenomegaly. 3. Colonic diverticulosis without findings of acute diverticulitis.   Colonoscopy in August 2019 with internal hemorrhoids, sigmoid and descending diverticulosis, and 3 polyps at the ileocecal valve.  Due for surveillance in 2024.   EGD August 2023: -Mild Schatzki's ring s/p dilation -Gastric  erosions of uncertain significance s/p biopsy revealing antral mucosa with focal erosion and reactive changes, no H. Pylori -Normal duodenum -Reported lansoprazole not helping as much as pantoprazole (which also was not controlling her reflux symptoms).  Medications   Current Outpatient Medications  Medication Sig Dispense Refill   acetaminophen (TYLENOL) 650 MG CR tablet Take 650-1,300 mg by mouth daily as needed for pain.     amiodarone (PACERONE) 200 MG tablet Take 1 tablet (200 mg total) by mouth daily. 90 tablet 1   amoxicillin-clavulanate (AUGMENTIN) 875-125 MG tablet Take 1 tablet by mouth every 12 (twelve) hours. 13 tablet 0   apixaban (ELIQUIS) 5 MG TABS tablet Take 1 tablet (5 mg total) by mouth 2 (two) times daily. 180 tablet 3   Chlorpheniramine Maleate (CHLOR-TABLETS PO) Take 2 tablets by mouth daily as needed (allergies).     docusate sodium (COLACE) 100 MG capsule Take 200 mg by mouth every morning.     esomeprazole (NEXIUM) 40 MG capsule TAKE 1 CAPSULE (40 MG TOTAL) BY MOUTH 2 (TWO) TIMES DAILY BEFORE A MEAL. 180 capsule 1   fluconazole (DIFLUCAN) 150 MG tablet Take 1 tablet (150 mg total) by mouth once as needed for up to 1 dose (yeast infection). 1 tablet 0   furosemide (LASIX) 20 MG tablet TAKE TWO TABLETS BY MOUTH DAILY AS NEEDED FOR FLUID 180 tablet 1   HYDROcodone-acetaminophen (NORCO) 5-325 MG tablet Take 1 tablet by mouth every 6 (six) hours as needed for severe pain (pain score 7-10). 10 tablet 0   levothyroxine (SYNTHROID, LEVOTHROID) 125 MCG tablet Take 125 mcg by mouth daily before breakfast.     metoprolol  tartrate (LOPRESSOR) 25 MG tablet TAKE ONE TABLET BY MOUTH TWO TIMES A DAY 180 tablet 2   promethazine (PHENERGAN) 25 MG tablet Take 1 tablet (25 mg total) by mouth every 6 (six) hours as needed for nausea or vomiting. 60 tablet 1   sucralfate (CARAFATE) 1 g tablet Take 1 tablet (1 g total) by mouth 4 (four) times daily -  with meals and at bedtime. 120 tablet 1    No current facility-administered medications for this visit.   Facility-Administered Medications Ordered in Other Visits  Medication Dose Route Frequency Provider Last Rate Last Admin   hydrocortisone cream 1 % 1 application  1 application  Topical TID PRN Jonelle Sidle, MD        Allergies   Allergies as of 08/20/2023 - Review Complete 08/20/2023  Allergen Reaction Noted   Flagyl [metronidazole] Other (See Comments) 05/01/2019     Past Medical History   Past Medical History:  Diagnosis Date   Anemia    Arthritis    Asthma    Atrial fibrillation (HCC)    CLL (chronic lymphocytic leukemia) (HCC)    Diverticulitis    Essential hypertension    GERD (gastroesophageal reflux disease)    History of hiatal hernia    Hyperlipidemia    Paroxysmal atrial fibrillation (HCC)    Splenomegaly     Past Surgical History   Past Surgical History:  Procedure Laterality Date   ANKLE SURGERY Right    repair fracture   BIOPSY  02/28/2022   Procedure: BIOPSY;  Surgeon: Corbin Ade, MD;  [facility]: [facility];  Service: Endoscopy;;  gastric   CARDIOVERSION N/A 03/20/2016   Procedure: CARDIOVERSION;  Surgeon: Jonelle Sidle, MD;  Location: [facility];  Service: Cardiovascular;  Laterality: N/A;   CARDIOVERSION N/A 05/22/2016   Procedure: CARDIOVERSION;  Surgeon: Jonelle Sidle, MD;  Location: [facility];  Service: Cardiovascular;  Laterality: N/A;   CARDIOVERSION N/A 11/20/2021   Procedure: CARDIOVERSION;  Surgeon: Christell Constant, MD;  Location: [facility];  Service: Cardiovascular;  Laterality: N/A;   CARDIOVERSION N/A 11/24/2021   Procedure: CARDIOVERSION;  Surgeon: Jonelle Sidle, MD;  Location: [facility];  Service: Cardiovascular;  Laterality: N/A;   CHOLECYSTECTOMY     COLONOSCOPY WITH PROPOFOL N/A 03/27/2018   Internal hemorrhoids, diverticulosis in sigmoid and descending colon, three 4-6 mm polyps at IC valve. tubular adenomas 5 year surveillance    ESOPHAGOGASTRODUODENOSCOPY (EGD) WITH PROPOFOL N/A 03/27/2018   Moderate Schatzki's ring s/p dilation, small hiatal hernia   ESOPHAGOGASTRODUODENOSCOPY (EGD) WITH PROPOFOL N/A 02/28/2022   Procedure: ESOPHAGOGASTRODUODENOSCOPY (EGD) WITH PROPOFOL;  Surgeon: Corbin Ade, MD;  [facility]: [facility];  Service: Endoscopy;  Laterality: N/A;  8:15am   HERNIA REPAIR  2011   Incisional and umbilical utilizing mesh   MALONEY DILATION N/A 03/27/2018   Procedure: Elease Hashimoto DILATION;  Surgeon: Corbin Ade, MD;  [facility]: [facility];  Service: Endoscopy;  Laterality: N/A;   MALONEY DILATION N/A 02/28/2022   Procedure: Elease Hashimoto DILATION;  Surgeon: Corbin Ade, MD;  [facility]: [facility];  Service: Endoscopy;  Laterality: N/A;   POLYPECTOMY  03/27/2018   Procedure: POLYPECTOMY;  Surgeon: Corbin Ade, MD;  [facility];  Service: Endoscopy;;  colon   ROTATOR CUFF REPAIR Left    TEE WITHOUT CARDIOVERSION N/A 01/17/2016   Procedure: TRANSESOPHAGEAL ECHOCARDIOGRAM (TEE);  Surgeon: Laqueta Linden, MD;  Location: [facility];  Service: Cardiovascular;  Laterality: N/A;   TEE  WITHOUT CARDIOVERSION N/A 02/21/2016   Procedure: TRANSESOPHAGEAL ECHOCARDIOGRAM (TEE) WITH PROPOFOL;  Surgeon: Laqueta Linden, MD;  Location: [facility];  Service: Cardiovascular;  Laterality: N/A;   TEE WITHOUT CARDIOVERSION N/A 03/20/2016   Procedure: TRANSESOPHAGEAL ECHOCARDIOGRAM (TEE) WITH PROPOFOL;  Surgeon: Jonelle Sidle, MD;  Location: [facility];  Service: Cardiovascular;  Laterality: N/A;   TONSILLECTOMY     TOTAL KNEE ARTHROPLASTY  2022    Past Family History   Family History  Problem Relation Age of Onset   Ovarian cancer Mother 18       Secondary to ovarian cancer   Leukemia Mother    Stroke Father 66       Brain stem infarction   Breast cancer Paternal Aunt    Crohn's disease Son    Colon cancer Neg Hx     Past Social History   Social History   Socioeconomic History   Marital  status: Married    Spouse name: Not on file   Number of children: 9   Years of education: Not on file   Highest education level: Not on file  Occupational History   Not on file  Tobacco Use   Smoking status: Former    Current packs/day: 0.00    Average packs/day: 0.3 packs/day for 2.0 years (0.5 ttl pk-yrs)    Types: Cigarettes    Start date: 08/02/1973    Quit date: 08/03/1975    Years since quitting: 48.0   Smokeless tobacco: Never  Vaping Use   Vaping status: Never Used  Substance and Sexual Activity   Alcohol use: No    Alcohol/week: 0.0 standard drinks of alcohol   Drug use: No   Sexual activity: Yes    Birth control/protection: Post-menopausal  Other Topics Concern   Not on file  Social History Narrative   Not on file   Social Drivers of Health   Financial Resource Strain: Not on file  Food Insecurity: Not on file  Transportation Needs: Not on file  Physical Activity: Not on file  Stress: Not on file  Social Connections: Not on file  Intimate Partner Violence: Not on file    Review of Systems   General: Negative for anorexia, weight loss, fever, chills, fatigue, weakness. ENT: Negative for hoarseness, difficulty swallowing , nasal congestion. CV: Negative for chest pain, angina, palpitations, dyspnea on exertion, peripheral edema.  Respiratory: Negative for dyspnea at rest, dyspnea on exertion, cough, sputum, wheezing.  GI: See history of present illness. GU:  Negative for dysuria, hematuria, urinary incontinence, urinary frequency, nocturnal urination.  Endo: Negative for unusual weight change.     Physical Exam   BP 119/75 (BP Location: Right Arm, Patient Position: Sitting, Cuff Size: Large)   Pulse 64   Temp 97.8 F (36.6 C) (Oral)   Ht 5\' 8"  (1.727 m)   Wt 235 lb (106.6 kg)   SpO2 97%   BMI 35.73 kg/m    General: Well-nourished, well-developed in no acute distress.  Eyes: No icterus. Mouth: Oropharyngeal mucosa moist and pink   Lungs: Clear to  auscultation bilaterally.  Heart: Regular rate and rhythm, no murmurs rubs or gallops.  Abdomen: Bowel sounds are normal,  nondistended, no hepatosplenomegaly or masses,  no abdominal bruits or hernia , no rebound or guarding. Llq/left mid abd tenderness. Could not appreciate splenomegaly. Epigastric tenderness as well. Rectal: not performed  Extremities: No lower extremity edema. No clubbing or deformities. Neuro: Alert and oriented x 4   Skin: Warm and dry,  no jaundice.   Psych: Alert and cooperative, normal mood and affect.  Labs   Lab Results  Component Value Date   NA 140 07/04/2023   CL 107 07/04/2023   K 3.5 07/04/2023   CO2 24 07/04/2023   BUN 14 07/04/2023   CREATININE 0.85 07/04/2023   GFRNONAA >60 07/04/2023   CALCIUM 10.2 07/04/2023   PHOS 3.3 11/22/2021   ALBUMIN 4.1 07/04/2023   GLUCOSE 87 07/04/2023   Lab Results  Component Value Date   ALT 18 07/04/2023   AST 18 07/04/2023   ALKPHOS 102 07/04/2023   BILITOT 1.2 (H) 07/04/2023   Lab Results  Component Value Date   WBC 36.0 (H) 07/04/2023   HGB 13.6 07/04/2023   HCT 45.1 07/04/2023   MCV 99.6 07/04/2023   PLT 101 (L) 07/04/2023   Lab Results  Component Value Date   LIPASE 27 07/04/2023    Imaging Studies   No results found.  Assessment/Plan:   GERD: Well-controlled -Continue esomeprazole 40 mg once to twice daily before meals  Epigastric pain/nausea: Somewhat chronic symptoms, flare at this time with nausea. -Continue PPI twice daily -Query in part due to splenomegaly -Based on CT findings, may require upper endoscopy in the near future  Left-sided abdominal pain: -Recent CT with findings of distal descending/proximal sigmoid colon diverticulitis December 5 -Symptoms never resolved with 1 week course of Augmentin -Started another 1 week course of Augmentin last week for worsening symptoms -Concern for persistent or complicated diverticulitis at this point -CT abdomen pelvis with contrast  today  History of adenomatous colon polyps: Overdue for surveillance colonoscopy -Recommend colonoscopy after she is over acute illness  Leanna Battles. Melvyn Neth, MHS, PA-C [facility]

## 2023-08-20 NOTE — Patient Instructions (Signed)
CT scan today. We will be in touch with results tomorrow. Continue augmentin.

## 2023-08-21 ENCOUNTER — Other Ambulatory Visit: Payer: Self-pay | Admitting: Gastroenterology

## 2023-08-21 MED ORDER — AMOXICILLIN-POT CLAVULANATE 875-125 MG PO TABS: 1.0000 | ORAL_TABLET | Freq: Two times a day (BID) | ORAL | 0 refills | Status: AC

## 2023-08-27 ENCOUNTER — Inpatient Hospital Stay: Payer: Medicare Other | Attending: Oncology | Admitting: Oncology

## 2023-08-27 ENCOUNTER — Encounter: Payer: Self-pay | Admitting: Oncology

## 2023-08-27 ENCOUNTER — Other Ambulatory Visit: Payer: Self-pay

## 2023-08-27 VITALS — BP 140/71 | HR 70 | Temp 97.8°F | Resp 18 | Ht 68.0 in | Wt 237.3 lb

## 2023-08-27 DIAGNOSIS — D696 Thrombocytopenia, unspecified: Secondary | ICD-10-CM | POA: Insufficient documentation

## 2023-08-27 DIAGNOSIS — C9110 Chronic lymphocytic leukemia of B-cell type not having achieved remission: Secondary | ICD-10-CM | POA: Diagnosis not present

## 2023-08-27 MED ORDER — HYDROCODONE-ACETAMINOPHEN 5-325 MG PO TABS: 1.0000 | ORAL_TABLET | Freq: Four times a day (QID) | ORAL | 0 refills | Status: DC | PRN

## 2023-08-27 NOTE — Progress Notes (Signed)
[facility]  [phone] [phone]  ID: Daisy Black OB: July 26, 1951  MR#: [id number]  WUX#:[id number]  Patient Care Team: Oneal Grout, FNP as PCP - General (Family Medicine) Jonelle Sidle, MD as PCP - Cardiology (Cardiology) Franky Macho, MD (General Surgery) Jim Like, RN as Registered Nurse Scarlett Presto, RN (Inactive) as Registered Nurse Jonelle Sidle, MD as Consulting Physician (Cardiology) Jena Gauss Gerrit Friends, MD as Consulting Physician (Gastroenterology) Jeralyn Ruths, MD as Consulting Physician (Hematology and Oncology) Oneal Grout, FNP (Family Medicine)  CHIEF COMPLAINT: CLL  INTERVAL HISTORY: Patient returns to clinic today as an add-on for further evaluation and discussion of her recent imaging results.  She is actively being evaluated by GI for increasing abdominal pain.  Recent CT scan revealed a stable splenomegaly.  She otherwise feels well.  She continues to have chronic fatigue.  She has no neurologic complaints.  She denies any fevers, chills, or night sweats.  She has no chest pain, shortness of breath, cough, or hemoptysis. She denies any nausea, vomiting, constipation, or diarrhea.  She has no urinary complaints.  Patient offers no further specific complaints today.  REVIEW OF SYSTEMS:   Review of Systems  Constitutional:  Positive for malaise/fatigue. Negative for diaphoresis, fever and weight loss.  Respiratory: Negative.  Negative for cough and shortness of breath.   Cardiovascular: Negative.  Negative for chest pain and leg swelling.  Gastrointestinal: Negative.  Negative for abdominal pain, blood in stool, constipation, diarrhea, heartburn, melena, nausea and vomiting.  Genitourinary:  Positive for flank pain.  Musculoskeletal:  Negative for back pain and joint pain.  Skin: Negative.  Negative for rash.  Neurological: Negative.  Negative for dizziness, sensory change, focal weakness,  weakness and headaches.  Psychiatric/Behavioral: Negative.  The patient is not nervous/anxious.     As per HPI. Otherwise, a complete review of systems is negative.  PAST MEDICAL HISTORY: Past Medical History:  Diagnosis Date   Anemia    Arthritis    Asthma    Atrial fibrillation (HCC)    CLL (chronic lymphocytic leukemia) (HCC)    Diverticulitis    Essential hypertension    GERD (gastroesophageal reflux disease)    History of hiatal hernia    Hyperlipidemia    Paroxysmal atrial fibrillation (HCC)    Splenomegaly     PAST SURGICAL HISTORY: Past Surgical History:  Procedure Laterality Date   ANKLE SURGERY Right    repair fracture   BIOPSY  02/28/2022   Procedure: BIOPSY;  Surgeon: Corbin Ade, MD;  [facility]: [facility];  Service: Endoscopy;;  gastric   CARDIOVERSION N/A 03/20/2016   Procedure: CARDIOVERSION;  Surgeon: Jonelle Sidle, MD;  Location: [facility];  Service: Cardiovascular;  Laterality: N/A;   CARDIOVERSION N/A 05/22/2016   Procedure: CARDIOVERSION;  Surgeon: Jonelle Sidle, MD;  Location: [facility];  Service: Cardiovascular;  Laterality: N/A;   CARDIOVERSION N/A 11/20/2021   Procedure: CARDIOVERSION;  Surgeon: Christell Constant, MD;  Location: [facility];  Service: Cardiovascular;  Laterality: N/A;   CARDIOVERSION N/A 11/24/2021   Procedure: CARDIOVERSION;  Surgeon: Jonelle Sidle, MD;  Location: [facility];  Service: Cardiovascular;  Laterality: N/A;   CHOLECYSTECTOMY     COLONOSCOPY WITH PROPOFOL N/A 03/27/2018   Internal hemorrhoids, diverticulosis in sigmoid and descending colon, three 4-6 mm polyps at IC valve. tubular adenomas 5 year surveillance   ESOPHAGOGASTRODUODENOSCOPY (EGD) WITH PROPOFOL N/A 03/27/2018   Moderate Schatzki's ring s/p dilation, small  hiatal hernia   ESOPHAGOGASTRODUODENOSCOPY (EGD) WITH PROPOFOL N/A 02/28/2022   Procedure: ESOPHAGOGASTRODUODENOSCOPY (EGD) WITH PROPOFOL;  Surgeon: Corbin Ade, MD;  [facility]: [facility];   Service: Endoscopy;  Laterality: N/A;  8:15am   HERNIA REPAIR  2011   Incisional and umbilical utilizing mesh   MALONEY DILATION N/A 03/27/2018   Procedure: Elease Hashimoto DILATION;  Surgeon: Corbin Ade, MD;  [facility]: [facility];  Service: Endoscopy;  Laterality: N/A;   MALONEY DILATION N/A 02/28/2022   Procedure: Elease Hashimoto DILATION;  Surgeon: Corbin Ade, MD;  [facility]: [facility];  Service: Endoscopy;  Laterality: N/A;   POLYPECTOMY  03/27/2018   Procedure: POLYPECTOMY;  Surgeon: Corbin Ade, MD;  [facility];  Service: Endoscopy;;  colon   ROTATOR CUFF REPAIR Left    TEE WITHOUT CARDIOVERSION N/A 01/17/2016   Procedure: TRANSESOPHAGEAL ECHOCARDIOGRAM (TEE);  Surgeon: Laqueta Linden, MD;  Location: [facility];  Service: Cardiovascular;  Laterality: N/A;   TEE WITHOUT CARDIOVERSION N/A 02/21/2016   Procedure: TRANSESOPHAGEAL ECHOCARDIOGRAM (TEE) WITH PROPOFOL;  Surgeon: Laqueta Linden, MD;  Location: [facility];  Service: Cardiovascular;  Laterality: N/A;   TEE WITHOUT CARDIOVERSION N/A 03/20/2016   Procedure: TRANSESOPHAGEAL ECHOCARDIOGRAM (TEE) WITH PROPOFOL;  Surgeon: Jonelle Sidle, MD;  Location: [facility];  Service: Cardiovascular;  Laterality: N/A;   TONSILLECTOMY     TOTAL KNEE ARTHROPLASTY  2022    FAMILY HISTORY Family History  Problem Relation Age of Onset   Ovarian cancer Mother 40       Secondary to ovarian cancer   Leukemia Mother    Stroke Father 59       Brain stem infarction   Breast cancer Paternal Aunt    Crohn's disease Son    Colon cancer Neg Hx        ADVANCED DIRECTIVES:    HEALTH MAINTENANCE: Social History   Tobacco Use   Smoking status: Former    Current packs/day: 0.00    Average packs/day: 0.3 packs/day for 2.0 years (0.5 ttl pk-yrs)    Types: Cigarettes    Start date: 08/02/1973    Quit date: 08/03/1975    Years since quitting: 48.0   Smokeless tobacco: Never  Vaping Use   Vaping status: Never Used  Substance Use  Topics   Alcohol use: No    Alcohol/week: 0.0 standard drinks of alcohol   Drug use: No     Colonoscopy:  PAP:  Bone density:  Lipid panel:  Allergies  Allergen Reactions   Flagyl [Metronidazole] Other (See Comments)    Classic side effect of severe nausea, unable to tolerate.     Current Outpatient Medications  Medication Sig Dispense Refill   acetaminophen (TYLENOL) 650 MG CR tablet Take 650-1,300 mg by mouth daily as needed for pain.     amiodarone (PACERONE) 200 MG tablet Take 1 tablet (200 mg total) by mouth daily. 90 tablet 1   amoxicillin-clavulanate (AUGMENTIN) 875-125 MG tablet Take 1 tablet by mouth every 12 (twelve) hours for 7 days. 14 tablet 0   apixaban (ELIQUIS) 5 MG TABS tablet Take 1 tablet (5 mg total) by mouth 2 (two) times daily. 180 tablet 3   Chlorpheniramine Maleate (CHLOR-TABLETS PO) Take 2 tablets by mouth daily as needed (allergies).     docusate sodium (COLACE) 100 MG capsule Take 200 mg by mouth every morning.     esomeprazole (NEXIUM) 40 MG capsule TAKE 1 CAPSULE (40 MG TOTAL) BY MOUTH 2 (TWO) TIMES DAILY  BEFORE A MEAL. 180 capsule 1   fluconazole (DIFLUCAN) 150 MG tablet Take 1 tablet (150 mg total) by mouth once as needed for up to 1 dose (yeast infection). 1 tablet 0   furosemide (LASIX) 20 MG tablet TAKE TWO TABLETS BY MOUTH DAILY AS NEEDED FOR FLUID 180 tablet 1   HYDROcodone-acetaminophen (NORCO) 5-325 MG tablet Take 1 tablet by mouth every 6 (six) hours as needed for severe pain (pain score 7-10). 10 tablet 0   levothyroxine (SYNTHROID, LEVOTHROID) 125 MCG tablet Take 125 mcg by mouth daily before breakfast.     metoprolol tartrate (LOPRESSOR) 25 MG tablet TAKE ONE TABLET BY MOUTH TWO TIMES A DAY 180 tablet 2   nystatin cream (MYCOSTATIN) Apply 1 Application topically 2 (two) times daily.     promethazine (PHENERGAN) 25 MG tablet Take 1 tablet (25 mg total) by mouth every 6 (six) hours as needed for nausea or vomiting. 60 tablet 1   sucralfate  (CARAFATE) 1 g tablet Take 1 tablet (1 g total) by mouth 4 (four) times daily -  with meals and at bedtime. 120 tablet 1   No current facility-administered medications for this visit.   Facility-Administered Medications Ordered in Other Visits  Medication Dose Route Frequency Provider Last Rate Last Admin   hydrocortisone cream 1 % 1 application  1 application  Topical TID PRN Jonelle Sidle, MD        OBJECTIVE: Vitals:   08/27/23 1311  BP: (!) 140/71  Pulse: 70  Resp: 18  Temp: 97.8 F (36.6 C)  SpO2: 97%     Body mass index is 36.08 kg/m.    ECOG FS:0 - Asymptomatic  General: Well-developed, well-nourished, no acute distress. Eyes: Pink conjunctiva, anicteric sclera. HEENT: Normocephalic, moist mucous membranes. Lungs: No audible wheezing or coughing. Heart: Regular rate and rhythm. Abdomen: Soft, nontender, no obvious distention. Musculoskeletal: No edema, cyanosis, or clubbing. Neuro: Alert, answering all questions appropriately. Cranial nerves grossly intact. Skin: No rashes or petechiae noted. Psych: Normal affect.  LAB RESULTS:  Lab Results  Component Value Date   NA 140 07/04/2023   K 3.5 07/04/2023   CL 107 07/04/2023   CO2 24 07/04/2023   GLUCOSE 87 07/04/2023   BUN 14 07/04/2023   CREATININE 0.85 07/04/2023   CALCIUM 10.2 07/04/2023   PROT 6.9 07/04/2023   ALBUMIN 4.1 07/04/2023   AST 18 07/04/2023   ALT 18 07/04/2023   ALKPHOS 102 07/04/2023   BILITOT 1.2 (H) 07/04/2023   GFRNONAA >60 07/04/2023   GFRAA >60 03/09/2020    Lab Results  Component Value Date   WBC 36.0 (H) 07/04/2023   NEUTROABS 7.2 07/04/2023   HGB 13.6 07/04/2023   HCT 45.1 07/04/2023   MCV 99.6 07/04/2023   PLT 101 (L) 07/04/2023     STUDIES: CT ABDOMEN PELVIS W CONTRAST Result Date: 08/20/2023 CLINICAL DATA:  Persistent left-sided abdominal pain. Prior diverticulitis. History of CLL. * Tracking Code: BO * EXAM: CT ABDOMEN AND PELVIS WITH CONTRAST TECHNIQUE:  Multidetector CT imaging of the abdomen and pelvis was performed using the standard protocol following bolus administration of intravenous contrast. RADIATION DOSE REDUCTION: This exam was performed according to the departmental dose-optimization program which includes automated exposure control, adjustment of the mA and/or kV according to patient size and/or use of iterative reconstruction technique. CONTRAST:  OMNIPAQUE IOHEXOL 300 MG/ML  SOLN COMPARISON:  CT 07/04/2023 and older FINDINGS: Lower chest: Some breathing motion at the lung bases. Slight linear opacity seen  likely scar or atelectasis. No pleural effusion. Hepatobiliary: No focal liver abnormality is seen. Status post cholecystectomy. No biliary dilatation. Patent portal vein. Pancreas: Moderate atrophy of the pancreas. Spleen: Spleen is enlarged with a cephalocaudal length of 16.9 cm. There are some faint low-density lesions in the spleen such as inferiorly on series 2, image 35 measuring 9 mm. This has unchanged from the recent prior. Small hilar splenule. Adrenals/Urinary Tract: Adrenal glands are grossly preserved. Minimal nodularity of the right adrenal gland is stable. No enhancing renal mass or collecting system dilatation. There are some tiny low-attenuation lesion seen along each kidney which are too small to completely characterize but likely benign cystic lesions. No specific imaging follow up these Bosniak 2 lesions. The ureters have normal course and caliber down to the bladder. Preserved contours of the urinary bladder. Stomach/Bowel: Oral contrast was administered. The large bowel has a normal course and caliber. Few air-fluid levels identified along the colon. Sigmoid colon diverticula are seen with some circular muscle hypertrophy. The previous stranding and wall thickening along the proximal sigmoid colon is improved. Stomach is nondilated. Small bowel has a normal course and caliber as well. Vascular/Lymphatic: Normal caliber  aorta and IVC. Minimal vascular calcifications. Once again there are numerous areas of abnormal nodal enlargement. Example nodes will be followed for continuity. This includes portacaval node on series 2, image 20 today measuring 3.7 by 1.6 cm and previously when measured in the same fashion would has a dimension 3.7 by 1.7 cm. Left pelvic sidewall node, external iliac chain on series 2, image 70 today measures 3.9 by 1.4 cm and previously would have measured 3.9 by 1.5 cm. Other nodes in the retroperitoneum, pelvic sidewall, inguinal regions and scattered in the mesentery are similar in extent and distribution. Reproductive: Uterus and bilateral adnexa are unremarkable. Other: No free air or free fluid. Musculoskeletal: Stable protuberance of the anterior abdominal wall with rectus muscle diastasis. Scattered degenerative changes of the spine and pelvis. IMPRESSION: Previous wall thickening and stranding along the proximal sigmoid colon has improved. No new areas identified. Scattered colonic diverticula particularly of the sigmoid colon with circular muscle hypertrophy. No obstruction. Air-fluid levels seen along the nondilated colon. Again evidence of splenomegaly with subtle splenic lesion and numerous nodes, similar to previous examination. Please correlate with the history of CLL. Electronically Signed   By: Karen Kays M.D.   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  PLAN:    CLL: Patient completed her second round of weekly single agent Rituxan x4 on August 20, 2019.  Her most recent CT scan on May 23, 2023 reviewed independently and report as above with essentially unchanged lymphadenopathy and splenomegaly.  Her white blood cell count is stable at 36.  No intervention is needed at this time.  If treatment is indicated, patient has indicated that she would prefer Imbruvica or zanubrutinib rather than repeat Rituxan.  Return to clinic as  previously scheduled in approximately 3 months with repeat imaging, laboratory work, and further evaluation. Splenomegaly/flank pain: Chronic and unchanged.  Unchanged for several years.  Low-density lesions seen in the spleen are also unchanged.  Unlikely the etiology of her worsening abdominal pain. Patient was given a prescription for Vicodin today. Abdominal pain: Have recommended patient continue to pursue follow-up with GI as well as undergo EGD and colonoscopy. Atrial fibrillation: Continue monitoring and treatment per cardiology. Iron deficiency anemia: Resolved.  She last received IV Feraheme in  May 2017.  Thrombocytopenia: Chronic and unchanged.  Patient most recent platelet count is 101.  Patient's known splenomegaly is likely contributing. PET positive thyroid lesions: 1.2 cm right lobe nodule also seen on thyroid ultrasound April 06, 2016.  No change on recent CT scan.  Continue follow-up with ENT as indicated.  I spent a total of 30 minutes reviewing chart data, face-to-face evaluation with the patient, counseling and coordination of care as detailed above.   Patient expressed understanding and was in agreement with this plan. She also understands that She can call clinic at any time with any questions, concerns, or complaints.     Jeralyn Ruths, MD   08/27/2023 1:20 PM

## 2023-09-16 ENCOUNTER — Ambulatory Visit: Payer: Medicare Other | Admitting: Gastroenterology

## 2023-09-22 NOTE — Progress Notes (Unsigned)
 GI Office Note    Referring Provider: Oneal Grout, FNP Primary Care Physician:  Oneal Grout, FNP  Primary Gastroenterologist: Roetta Sessions, MD   Chief Complaint   No chief complaint on file.   History of Present Illness   Daisy Black is a 73 y.o. female presenting today for follow up. Last seen 07/2023.     She has a history of diverticulitis, epigastric pain, GERD, anemia, asthma, hyperlipidemia, hypertension, A-fib on Eliquis, CLL following at [facility].   She is overdue for surveillance colonoscopy.***schedule if abd pain better.   CT A/P with contrast 07/2023: IMPRESSION: Previous wall thickening and stranding along the proximal sigmoid colon has improved. No new areas identified. Scattered colonic diverticula particularly of the sigmoid colon with circular muscle hypertrophy. No obstruction. Air-fluid levels seen along the nondilated colon.   Again evidence of splenomegaly with subtle splenic lesion and numerous nodes, similar to previous examination. Please correlate with the history of CLL.     Colonoscopy in August 2019 with internal hemorrhoids, sigmoid and descending diverticulosis, and 3 polyps at the ileocecal valve.  Due for surveillance in 2024.   EGD August 2023: -Mild Schatzki's ring s/p dilation -Gastric erosions of uncertain significance s/p biopsy revealing antral mucosa with focal erosion and reactive changes, no H. Pylori -Normal duodenum -Reported lansoprazole not helping as much as pantoprazole (which also was not controlling her reflux symptoms).   Medications   Current Outpatient Medications  Medication Sig Dispense Refill   acetaminophen (TYLENOL) 650 MG CR tablet Take 650-1,300 mg by mouth daily as needed for pain.     amiodarone (PACERONE) 200 MG tablet Take 1 tablet (200 mg total) by mouth daily. 90 tablet 1   apixaban (ELIQUIS) 5 MG TABS tablet Take 1 tablet (5 mg total) by mouth 2 (two) times daily. 180  tablet 3   Chlorpheniramine Maleate (CHLOR-TABLETS PO) Take 2 tablets by mouth daily as needed (allergies).     docusate sodium (COLACE) 100 MG capsule Take 200 mg by mouth every morning.     esomeprazole (NEXIUM) 40 MG capsule TAKE 1 CAPSULE (40 MG TOTAL) BY MOUTH 2 (TWO) TIMES DAILY BEFORE A MEAL. 180 capsule 1   fluconazole (DIFLUCAN) 150 MG tablet Take 1 tablet (150 mg total) by mouth once as needed for up to 1 dose (yeast infection). 1 tablet 0   furosemide (LASIX) 20 MG tablet TAKE TWO TABLETS BY MOUTH DAILY AS NEEDED FOR FLUID 180 tablet 1   HYDROcodone-acetaminophen (NORCO) 5-325 MG tablet Take 1 tablet by mouth every 6 (six) hours as needed for severe pain (pain score 7-10). 60 tablet 0   levothyroxine (SYNTHROID, LEVOTHROID) 125 MCG tablet Take 125 mcg by mouth daily before breakfast.     metoprolol tartrate (LOPRESSOR) 25 MG tablet TAKE ONE TABLET BY MOUTH TWO TIMES A DAY 180 tablet 2   nystatin cream (MYCOSTATIN) Apply 1 Application topically 2 (two) times daily.     promethazine (PHENERGAN) 25 MG tablet Take 1 tablet (25 mg total) by mouth every 6 (six) hours as needed for nausea or vomiting. 60 tablet 1   sucralfate (CARAFATE) 1 g tablet Take 1 tablet (1 g total) by mouth 4 (four) times daily -  with meals and at bedtime. 120 tablet 1   No current facility-administered medications for this visit.   Facility-Administered Medications Ordered in Other Visits  Medication Dose Route Frequency Provider Last Rate Last Admin   hydrocortisone cream 1 % 1  application  1 application  Topical TID PRN Jonelle Sidle, MD        Allergies   Allergies as of 09/23/2023 - Review Complete 08/27/2023  Allergen Reaction Noted   Flagyl [metronidazole] Other (See Comments) 05/01/2019     Past Medical History   Past Medical History:  Diagnosis Date   Anemia    Arthritis    Asthma    Atrial fibrillation (HCC)    CLL (chronic lymphocytic leukemia) (HCC)    Diverticulitis    Essential  hypertension    GERD (gastroesophageal reflux disease)    History of hiatal hernia    Hyperlipidemia    Paroxysmal atrial fibrillation (HCC)    Splenomegaly     Past Surgical History   Past Surgical History:  Procedure Laterality Date   ANKLE SURGERY Right    repair fracture   BIOPSY  02/28/2022   Procedure: BIOPSY;  Surgeon: Corbin Ade, MD;  [facility];  Service: Endoscopy;;  gastric   CARDIOVERSION N/A 03/20/2016   Procedure: CARDIOVERSION;  Surgeon: Jonelle Sidle, MD;  Location: [facility];  Service: Cardiovascular;  Laterality: N/A;   CARDIOVERSION N/A 05/22/2016   Procedure: CARDIOVERSION;  Surgeon: Jonelle Sidle, MD;  Location: [facility];  Service: Cardiovascular;  Laterality: N/A;   CARDIOVERSION N/A 11/20/2021   Procedure: CARDIOVERSION;  Surgeon: Christell Constant, MD;  Location: [facility];  Service: Cardiovascular;  Laterality: N/A;   CARDIOVERSION N/A 11/24/2021   Procedure: CARDIOVERSION;  Surgeon: Jonelle Sidle, MD;  Location: [facility];  Service: Cardiovascular;  Laterality: N/A;   CHOLECYSTECTOMY     COLONOSCOPY WITH PROPOFOL N/A 03/27/2018   Internal hemorrhoids, diverticulosis in sigmoid and descending colon, three 4-6 mm polyps at IC valve. tubular adenomas 5 year surveillance   ESOPHAGOGASTRODUODENOSCOPY (EGD) WITH PROPOFOL N/A 03/27/2018   Moderate Schatzki's ring s/p dilation, small hiatal hernia   ESOPHAGOGASTRODUODENOSCOPY (EGD) WITH PROPOFOL N/A 02/28/2022   Procedure: ESOPHAGOGASTRODUODENOSCOPY (EGD) WITH PROPOFOL;  Surgeon: Corbin Ade, MD;  [facility];  Service: Endoscopy;  Laterality: N/A;  8:15am   HERNIA REPAIR  2011   Incisional and umbilical utilizing mesh   MALONEY DILATION N/A 03/27/2018   Procedure: Elease Hashimoto DILATION;  Surgeon: Corbin Ade, MD;  [facility];  Service: Endoscopy;  Laterality: N/A;   MALONEY DILATION N/A 02/28/2022   Procedure: Elease Hashimoto DILATION;  Surgeon: Corbin Ade, MD;   [facility];  Service: Endoscopy;  Laterality: N/A;   POLYPECTOMY  03/27/2018   Procedure: POLYPECTOMY;  Surgeon: Corbin Ade, MD;  [facility];  Service: Endoscopy;;  colon   ROTATOR CUFF REPAIR Left    TEE WITHOUT CARDIOVERSION N/A 01/17/2016   Procedure: TRANSESOPHAGEAL ECHOCARDIOGRAM (TEE);  Surgeon: Laqueta Linden, MD;  Location: [facility];  Service: Cardiovascular;  Laterality: N/A;   TEE WITHOUT CARDIOVERSION N/A 02/21/2016   Procedure: TRANSESOPHAGEAL ECHOCARDIOGRAM (TEE) WITH PROPOFOL;  Surgeon: Laqueta Linden, MD;  Location: [facility];  Service: Cardiovascular;  Laterality: N/A;   TEE WITHOUT CARDIOVERSION N/A 03/20/2016   Procedure: TRANSESOPHAGEAL ECHOCARDIOGRAM (TEE) WITH PROPOFOL;  Surgeon: Jonelle Sidle, MD;  Location: [facility];  Service: Cardiovascular;  Laterality: N/A;   TONSILLECTOMY     TOTAL KNEE ARTHROPLASTY  2022    Past Family History   Family History  Problem Relation Age of Onset   Ovarian cancer Mother 44       Secondary to ovarian cancer   Leukemia Mother  Stroke Father 107       Brain stem infarction   Breast cancer Paternal Aunt    Crohn's disease Son    Colon cancer Neg Hx     Past Social History   Social History   Socioeconomic History   Marital status: Married    Spouse name: Not on file   Number of children: 9   Years of education: Not on file   Highest education level: Not on file  Occupational History   Not on file  Tobacco Use   Smoking status: Former    Current packs/day: 0.00    Average packs/day: 0.3 packs/day for 2.0 years (0.5 ttl pk-yrs)    Types: Cigarettes    Start date: 08/02/1973    Quit date: 08/03/1975    Years since quitting: 48.1   Smokeless tobacco: Never  Vaping Use   Vaping status: Never Used  Substance and Sexual Activity   Alcohol use: No    Alcohol/week: 0.0 standard drinks of alcohol   Drug use: No   Sexual activity: Yes    Birth control/protection: Post-menopausal  Other  Topics Concern   Not on file  Social History Narrative   Not on file   Social Drivers of Health   Financial Resource Strain: Not on file  Food Insecurity: Not on file  Transportation Needs: Not on file  Physical Activity: Not on file  Stress: Not on file  Social Connections: Not on file  Intimate Partner Violence: Not on file    Review of Systems   General: Negative for anorexia, weight loss, fever, chills, fatigue, weakness. ENT: Negative for hoarseness, difficulty swallowing , nasal congestion. CV: Negative for chest pain, angina, palpitations, dyspnea on exertion, peripheral edema.  Respiratory: Negative for dyspnea at rest, dyspnea on exertion, cough, sputum, wheezing.  GI: See history of present illness. GU:  Negative for dysuria, hematuria, urinary incontinence, urinary frequency, nocturnal urination.  Endo: Negative for unusual weight change.     Physical Exam   There were no vitals taken for this visit.   General: Well-nourished, well-developed in no acute distress.  Eyes: No icterus. Mouth: Oropharyngeal mucosa moist and pink , no lesions erythema or exudate. Lungs: Clear to auscultation bilaterally.  Heart: Regular rate and rhythm, no murmurs rubs or gallops.  Abdomen: Bowel sounds are normal, nontender, nondistended, no hepatosplenomegaly or masses,  no abdominal bruits or hernia , no rebound or guarding.  Rectal: ***  Extremities: No lower extremity edema. No clubbing or deformities. Neuro: Alert and oriented x 4   Skin: Warm and dry, no jaundice.   Psych: Alert and cooperative, normal mood and affect.  Labs   Lab Results  Component Value Date   NA 140 07/04/2023   CL 107 07/04/2023   K 3.5 07/04/2023   CO2 24 07/04/2023   BUN 14 07/04/2023   CREATININE 0.85 07/04/2023   GFRNONAA >60 07/04/2023   CALCIUM 10.2 07/04/2023   PHOS 3.3 11/22/2021   ALBUMIN 4.1 07/04/2023   GLUCOSE 87 07/04/2023   Lab Results  Component Value Date   ALT 18 07/04/2023    AST 18 07/04/2023   ALKPHOS 102 07/04/2023   BILITOT 1.2 (H) 07/04/2023   Lab Results  Component Value Date   WBC 36.0 (H) 07/04/2023   HGB 13.6 07/04/2023   HCT 45.1 07/04/2023   MCV 99.6 07/04/2023   PLT 101 (L) 07/04/2023   Lab Results  Component Value Date   LIPASE 27 07/04/2023    Imaging  Studies   No results found.  Assessment       PLAN   ***   Leanna Battles. Melvyn Neth, MHS, PA-C [facility]

## 2023-09-23 ENCOUNTER — Ambulatory Visit (INDEPENDENT_AMBULATORY_CARE_PROVIDER_SITE_OTHER): Payer: Medicare Other | Admitting: Gastroenterology

## 2023-09-23 ENCOUNTER — Telehealth: Payer: Self-pay | Admitting: *Deleted

## 2023-09-23 ENCOUNTER — Encounter: Payer: Self-pay | Admitting: Gastroenterology

## 2023-09-23 VITALS — BP 136/77 | HR 65 | Temp 97.6°F | Ht 67.0 in | Wt 238.6 lb

## 2023-09-23 DIAGNOSIS — K219 Gastro-esophageal reflux disease without esophagitis: Secondary | ICD-10-CM

## 2023-09-23 DIAGNOSIS — R1013 Epigastric pain: Secondary | ICD-10-CM

## 2023-09-23 DIAGNOSIS — Z860100 Personal history of colon polyps, unspecified: Secondary | ICD-10-CM

## 2023-09-23 DIAGNOSIS — R1319 Other dysphagia: Secondary | ICD-10-CM

## 2023-09-23 DIAGNOSIS — Z860101 Personal history of adenomatous and serrated colon polyps: Secondary | ICD-10-CM | POA: Insufficient documentation

## 2023-09-23 DIAGNOSIS — R1310 Dysphagia, unspecified: Secondary | ICD-10-CM | POA: Diagnosis not present

## 2023-09-23 DIAGNOSIS — R933 Abnormal findings on diagnostic imaging of other parts of digestive tract: Secondary | ICD-10-CM

## 2023-09-23 DIAGNOSIS — R1012 Left upper quadrant pain: Secondary | ICD-10-CM | POA: Diagnosis not present

## 2023-09-23 MED ORDER — FAMOTIDINE 20 MG PO TABS: 20.0000 mg | ORAL_TABLET | Freq: Two times a day (BID) | ORAL | 0 refills | Status: AC | PRN

## 2023-09-23 MED ORDER — LIDOCAINE VISCOUS HCL 2 % MT SOLN: OROMUCOSAL | 0 refills | Status: DC

## 2023-09-23 NOTE — Telephone Encounter (Signed)
  Request for patient to stop medication prior to procedure or is needing cleareance  09/23/23  Daisy Black 08-06-1950  What type of surgery is being performed? Colonoscopy/Esophagogastroduodenoscopy (EGD) with esophageal dilation  When is surgery scheduled? TBD  What type of clearance is required (medical or pharmacy to hold medication or both? medication  Are there any medications that need to be held prior to surgery and how long? Eliquis x 2 days  Name of physician performing surgery?  Dr.Rourk [facility] [employer]: [phone] Fax: [phone]  Anethesia type (none, local, MAC, general)? MAC

## 2023-09-23 NOTE — Patient Instructions (Addendum)
 Continue esomeprazole 40mg  twice daily, before breakfast and evening meal. You can use famotidine 20mg  up to twice daily for upper abdominal pain, reflux.  Short course of lidocaine provided to help with abdominal/esophageal pain. I will send this in little later today, call pharmacy before you go. We will be in touch to schedule colonoscopy and upper endoscopy once cardiology gives [place] the ok.

## 2023-09-23 NOTE — Telephone Encounter (Signed)
 Pharmacy please advise on holding Eliquis prior to EGD and colonoscopy scheduled for TBD. Thank you.

## 2023-09-25 DIAGNOSIS — D509 Iron deficiency anemia, unspecified: Secondary | ICD-10-CM | POA: Insufficient documentation

## 2023-09-25 DIAGNOSIS — C9110 Chronic lymphocytic leukemia of B-cell type not having achieved remission: Secondary | ICD-10-CM | POA: Insufficient documentation

## 2023-09-25 DIAGNOSIS — C9590 Leukemia, unspecified not having achieved remission: Secondary | ICD-10-CM | POA: Insufficient documentation

## 2023-09-25 NOTE — Telephone Encounter (Signed)
 Patient with diagnosis of A Fib on Eliquis for anticoagulation.    Procedure: Colonoscopy/Esophagogastroduodenoscopy (EGD) with esophageal dilation  Date of procedure: TBD   CHA2DS2-VASc Score = 4  This indicates a 4.8% annual risk of stroke. The patient's score is based upon: CHF History: 1 HTN History: 1 Diabetes History: 0 Stroke History: 0 Vascular Disease History: 0 Age Score: 1 Gender Score: 1   CrCl 102 ml/min Platelet count 101K  Per office protocol, patient can hold Eliquis for 2 days prior to procedure.    **This guidance is not considered finalized until pre-operative APP has relayed final recommendations.**

## 2023-09-25 NOTE — Telephone Encounter (Signed)
   Patient Name: Daisy Black  DOB: 1951-03-20 MRN: [id number]  Primary Cardiologist: Nona Dell, MD  Clinical pharmacists have reviewed the patient's past medical history, labs, and current medications as part of preoperative protocol coverage. The following recommendations have been made:  Patient with diagnosis of A Fib on Eliquis for anticoagulation.     Procedure: Colonoscopy/Esophagogastroduodenoscopy (EGD) with esophageal dilation  Date of procedure: TBD     CHA2DS2-VASc Score = 4  This indicates a 4.8% annual risk of stroke. The patient's score is based upon: CHF History: 1 HTN History: 1 Diabetes History: 0 Stroke History: 0 Vascular Disease History: 0 Age Score: 1 Gender Score: 1   CrCl 102 ml/min Platelet count 101K   Per office protocol, patient can hold Eliquis for 2 days prior to procedure.  Please resume Eliquis as soon as possible postprocedure, at the discretion of the surgeon.    I will route this recommendation to the requesting party via Epic fax function and remove from pre-op pool.  Please call with questions.  Joylene Grapes, NP 09/25/2023, 1:55 PM

## 2023-09-27 ENCOUNTER — Other Ambulatory Visit: Payer: Self-pay | Admitting: Gastroenterology

## 2023-09-27 DIAGNOSIS — K219 Gastro-esophageal reflux disease without esophagitis: Secondary | ICD-10-CM

## 2023-09-27 DIAGNOSIS — R1013 Epigastric pain: Secondary | ICD-10-CM

## 2023-10-02 ENCOUNTER — Other Ambulatory Visit: Payer: Self-pay | Admitting: Cardiology

## 2023-10-02 NOTE — Telephone Encounter (Signed)
 Attempted to call pt, no answer and vm not set up  TCS/EGD/ED w/Dr.Rourk, asa 3. Clenpiq

## 2023-10-02 NOTE — Telephone Encounter (Signed)
 Ok to schedule procedures per encounter form instructions.  Hold eliquis 48 hours.

## 2023-10-03 ENCOUNTER — Encounter: Payer: Self-pay | Admitting: *Deleted

## 2023-10-03 ENCOUNTER — Other Ambulatory Visit: Payer: Self-pay | Admitting: *Deleted

## 2023-10-03 MED ORDER — CLENPIQ 10-3.5-12 MG-GM -GM/175ML PO SOLN: 1.0000 | ORAL | 0 refills | Status: DC

## 2023-10-03 NOTE — Telephone Encounter (Signed)
 Pt has been scheduled for 11/22/23. Instructions mailed an prep sent to the pharmacy.

## 2023-10-16 ENCOUNTER — Telehealth: Payer: Self-pay | Admitting: Cardiology

## 2023-10-16 MED ORDER — METOPROLOL TARTRATE 25 MG PO TABS: 25.0000 mg | ORAL_TABLET | Freq: Two times a day (BID) | ORAL | 0 refills | Status: DC

## 2023-10-16 NOTE — Telephone Encounter (Signed)
 Refill request completed.

## 2023-10-16 NOTE — Telephone Encounter (Signed)
 1. Which medications need to be refilled? (please list name of each medication and dose if known)   Metoprolol 25mg   2. Which pharmacy/location (including street and city if local pharmacy) is medication to be sent to? [facility]   3. Do they need a 30 day or 90 day supply? 30

## 2023-11-04 ENCOUNTER — Other Ambulatory Visit: Payer: Self-pay | Admitting: Cardiology

## 2023-11-18 NOTE — Patient Instructions (Signed)
 MONTA MAIORANA  11/18/2023     @PREFPERIOPPHARMACY @   Your procedure is scheduled on  11/22/2023.   Report to [facility] at  1045 A.M.   Call this number if you have problems the morning of surgery:  [phone]  If you experience any cold or flu symptoms such as cough, fever, chills, shortness of breath, etc. between now and your scheduled surgery, please notify us  at the above number.   Remember:  Follow the diet and prep instructions given to you by the office.   You may drink clear liquids until  0845 am on 11/22/2023.    Clear liquids allowed are:                    Water, Juice (No red color; non-citric and without pulp; diabetics please choose diet or no sugar options), Carbonated beverages (diabetics please choose diet or no sugar options), Clear Tea (No creamer, milk, or cream, including half & half and powdered creamer), Black Coffee Only (No creamer, milk or cream, including half & half and powdered creamer), and Clear Sports drink (No red color; diabetics please choose diet or no sugar options)    Take these medicines the morning of surgery with A SIP OF WATER                 amiodarone , esomeprazole , famotidine , hydrocodone  (If needed), levothyroxine , metoprolol .    Do not wear jewelry, make-up or nail polish, including gel polish,  artificial nails, or any other type of covering on natural nails (fingers and  toes).  Do not wear lotions, powders, or perfumes, or deodorant.  Do not shave 48 hours prior to surgery.  Men may shave face and neck.  Do not bring valuables to the hospital.  [facility] is not responsible for any belongings or valuables.  Contacts, dentures or bridgework may not be worn into surgery.  Leave your suitcase in the car.  After surgery it may be brought to your room.  For patients admitted to the hospital, discharge time will be determined by your treatment team.  Patients discharged the day of surgery will not be allowed to  drive home and must have someone with them for 24 hours.    Special instructions:   DO NOT smoke tobacco or vape for 24 hours before your procedure.  Please read over the following fact sheets that you were given. Anesthesia Post-op Instructions and Care and Recovery After Surgery      Upper Endoscopy, Adult, Care After After the procedure, it is common to have a sore throat. It is also common to have: Mild stomach pain or discomfort. Bloating. Nausea. Follow these instructions at home: The instructions below may help you care for yourself at home. Your health care provider may give you more instructions. If you have questions, ask your health care provider. If you were given a sedative during the procedure, it can affect you for several hours. Do not drive or operate machinery until your health care provider says that it is safe. If you will be going home right after the procedure, plan to have a responsible adult: Take you home from the hospital or clinic. You will not be allowed to drive. Care for you for the time you are told. Follow instructions from your health care provider about what you may eat and drink. Return to your normal activities as told by your health care provider. Ask your health  care provider what activities are safe for you. Take over-the-counter and prescription medicines only as told by your health care provider. Contact a health care provider if you: Have a sore throat that lasts longer than one day. Have trouble swallowing. Have a fever. Get help right away if you: Vomit blood or your vomit looks like coffee grounds. Have bloody, black, or tarry stools. Have a very bad sore throat or you cannot swallow. Have difficulty breathing or very bad pain in your chest or abdomen. These symptoms may be an emergency. Get help right away. Call 911. Do not wait to see if the symptoms will go away. Do not drive yourself to the hospital. Summary After the procedure, it  is common to have a sore throat, mild stomach discomfort, bloating, and nausea. If you were given a sedative during the procedure, it can affect you for several hours. Do not drive until your health care provider says that it is safe. Follow instructions from your health care provider about what you may eat and drink. Return to your normal activities as told by your health care provider. This information is not intended to replace advice given to you by your health care provider. Make sure you discuss any questions you have with your health care provider. Document Revised: 10/25/2021 Document Reviewed: 10/25/2021 Elsevier Patient Education  2024 [employer]Esophageal Dilatation Esophageal dilatation, or dilation, is done to stretch a blocked or narrowed part of your esophagus. The esophagus is the part of your body that moves food from your mouth to your stomach. You may need to have it stretched if: You have a lot of scar tissue and it makes it hard or painful to swallow. You have cancer of the esophagus. There's a problem with how food moves through your esophagus. In some cases, you may need to have this procedure done more than once. Tell a health care provider about: Any allergies you have. All medicines you're taking, including vitamins, herbs, eye drops, creams, and over-the-counter medicines. Any problems you or family members have had with anesthesia. Any bleeding problems you have. Any surgeries you've had. Any medical conditions you have. Whether you're pregnant or may be pregnant. What are the risks? Your health care provider will talk with you about risks. These may include: Bleeding. A hole or tear in your esophagus. What happens before the procedure? When to stop eating and drinking Follow instructions from your provider about what you may eat and drink. These may include: 8 hours before your procedure Stop eating most foods. Do not eat meat, fried foods, or fatty  foods. Eat only light foods, such as toast or crackers. All liquids are okay except energy drinks and alcohol. 6 hours before your procedure Stop eating. Drink only clear liquids, such as water, clear fruit juice, black coffee, plain tea, and sports drinks. Do not drink energy drinks or alcohol. 2 hours before your procedure Stop drinking all liquids. You may be allowed to take medicines with small sips of water. If you don't follow your provider's instructions, your procedure may be delayed or canceled. Medicines Ask your provider about: Changing or stopping your regular medicines. These include any diabetes medicines or blood thinners you take. Taking medicines such as aspirin  and ibuprofen. These medicines can thin your blood. Do not take them unless your provider tells you to. Taking over-the-counter medicines, vitamins, herbs, and supplements. General instructions If you'll be going home right after the procedure, plan to have a responsible adult: Take you home  from the hospital or clinic. You won't be allowed to drive. Care for you for the time you're told. What happens during the procedure? You may be given: A sedative. This helps you relax. Anesthesia. This keeps you from feeling pain. It will numb certain areas of your body. The stretching may be done with: Simple dilators. These are tools put in your esophagus to stretch it. Guide wires. These wires are put in using a tube called an endoscope. A dilator is put over the wires to stretch your esophagus. Then the wires are taken out. A balloon. The balloon is on the end of a tube. It's inflated to stretch your esophagus. The procedure may vary among providers and hospitals. What can I expect after the procedure? Your blood pressure, heart rate, breathing rate, and blood oxygen level will be monitored until you leave the hospital or clinic. Your throat may feel sore and numb. This will get better over time. You won't be allowed  to eat or drink until your throat is no longer numb. You may be able to go home when you can: Drink. Pee. Sit on the edge of the bed without nausea or dizziness. Follow these instructions at home: Activity If you were given a sedative during the procedure, it can affect you for several hours. Do not drive or operate machinery until your provider says it's safe. Return to your normal activities as told by your provider. Ask your provider what activities are safe for you. General instructions Take over-the-counter and prescription medicines only as told by your provider. Follow instructions from your provider about what you may eat and drink. Do not use any products that contain nicotine or tobacco. These products include cigarettes, chewing tobacco, and vaping devices, such as e-cigarettes. If you need help quitting, ask your provider. Keep all follow-up visits. Your provider will make sure the procedure worked. Where to find more information American Society for Gastrointestinal Endoscopy (ASGE): asge.org Contact a health care provider if: You have trouble swallowing. You have a fever. Your pain doesn't get better with medicine. Get help right away if: You have chest pain. You have trouble breathing. You vomit blood. Your poop is: Black. Tarry. Bloody. These symptoms may be an emergency. Get help right away. Call 911. Do not wait to see if the symptoms will go away. Do not drive yourself to the hospital. This information is not intended to replace advice given to you by your health care provider. Make sure you discuss any questions you have with your health care provider. Document Revised: 10/12/2022 Document Reviewed: 10/12/2022 Elsevier Patient Education  2024 [employer].Colonoscopy, Adult, Care After The following information offers guidance on how to care for yourself after your procedure. Your health care provider may also give you more specific instructions. If you have  problems or questions, contact your health care provider. What can I expect after the procedure? After the procedure, it is common to have: A small amount of blood in your stool for 24 hours after the procedure. Some gas. Mild cramping or bloating of your abdomen. Follow these instructions at home: Eating and drinking  Drink enough fluid to keep your urine pale yellow. Follow instructions from your health care provider about eating or drinking restrictions. Resume your normal diet as told by your health care provider. Avoid heavy or fried foods that are hard to digest. Activity Rest as told by your health care provider. Avoid sitting for a long time without moving. Get up to take short  walks every 1-2 hours. This is important to improve blood flow and breathing. Ask for help if you feel weak or unsteady. Return to your normal activities as told by your health care provider. Ask your health care provider what activities are safe for you. Managing cramping and bloating  Try walking around when you have cramps or feel bloated. If directed, apply heat to your abdomen as told by your health care provider. Use the heat source that your health care provider recommends, such as a moist heat pack or a heating pad. Place a towel between your skin and the heat source. Leave the heat on for 20-30 minutes. Remove the heat if your skin turns bright red. This is especially important if you are unable to feel pain, heat, or cold. You have a greater risk of getting burned. General instructions If you were given a sedative during the procedure, it can affect you for several hours. Do not drive or operate machinery until your health care provider says that it is safe. For the first 24 hours after the procedure: Do not sign important documents. Do not drink alcohol. Do your regular daily activities at a slower pace than normal. Eat soft foods that are easy to digest. Take over-the-counter and prescription  medicines only as told by your health care provider. Keep all follow-up visits. This is important. Contact a health care provider if: You have blood in your stool 2-3 days after the procedure. Get help right away if: You have more than a small spotting of blood in your stool. You have large blood clots in your stool. You have swelling of your abdomen. You have nausea or vomiting. You have a fever. You have increasing pain in your abdomen that is not relieved with medicine. These symptoms may be an emergency. Get help right away. Call 911. Do not wait to see if the symptoms will go away. Do not drive yourself to the hospital. Summary After the procedure, it is common to have a small amount of blood in your stool. You may also have mild cramping and bloating of your abdomen. If you were given a sedative during the procedure, it can affect you for several hours. Do not drive or operate machinery until your health care provider says that it is safe. Get help right away if you have a lot of blood in your stool, nausea or vomiting, a fever, or increased pain in your abdomen. This information is not intended to replace advice given to you by your health care provider. Make sure you discuss any questions you have with your health care provider. Document Revised: 08/28/2022 Document Reviewed: 03/08/2021 Elsevier Patient Education  2024 Else[employer] Inc.General Anesthesia, Adult, Care After The following information offers guidance on how to care for yourself after your procedure. Your health care provider may also give you more specific instructions. If you have problems or questions, contact your health care provider. What can I expect after the procedure? After the procedure, it is common for people to: Have pain or discomfort at the IV site. Have nausea or vomiting. Have a sore throat or hoarseness. Have trouble concentrating. Feel cold or chills. Feel weak, sleepy, or tired (fatigue). Have  soreness and body aches. These can affect parts of the body that were not involved in surgery. Follow these instructions at home: For the time period you were told by your health care provider:  Rest. Do not participate in activities where you could fall or become injured. Do not drive  or use machinery. Do not drink alcohol. Do not take sleeping pills or medicines that cause drowsiness. Do not make important decisions or sign legal documents. Do not take care of children on your own. General instructions Drink enough fluid to keep your urine pale yellow. If you have sleep apnea, surgery and certain medicines can increase your risk for breathing problems. Follow instructions from your health care provider about wearing your sleep device: Anytime you are sleeping, including during daytime naps. While taking prescription pain medicines, sleeping medicines, or medicines that make you drowsy. Return to your normal activities as told by your health care provider. Ask your health care provider what activities are safe for you. Take over-the-counter and prescription medicines only as told by your health care provider. Do not use any products that contain nicotine or tobacco. These products include cigarettes, chewing tobacco, and vaping devices, such as e-cigarettes. These can delay incision healing after surgery. If you need help quitting, ask your health care provider. Contact a health care provider if: You have nausea or vomiting that does not get better with medicine. You vomit every time you eat or drink. You have pain that does not get better with medicine. You cannot urinate or have bloody urine. You develop a skin rash. You have a fever. Get help right away if: You have trouble breathing. You have chest pain. You vomit blood. These symptoms may be an emergency. Get help right away. Call 911. Do not wait to see if the symptoms will go away. Do not drive yourself to the  hospital. Summary After the procedure, it is common to have a sore throat, hoarseness, nausea, vomiting, or to feel weak, sleepy, or fatigue. For the time period you were told by your health care provider, do not drive or use machinery. Get help right away if you have difficulty breathing, have chest pain, or vomit blood. These symptoms may be an emergency. This information is not intended to replace advice given to you by your health care provider. Make sure you discuss any questions you have with your health care provider. Document Revised: 10/13/2021 Document Reviewed: 10/13/2021 Elsevier Patient Education  2024 [employer].

## 2023-11-19 ENCOUNTER — Other Ambulatory Visit: Payer: Self-pay | Admitting: *Deleted

## 2023-11-19 ENCOUNTER — Encounter: Payer: Self-pay | Admitting: *Deleted

## 2023-11-19 ENCOUNTER — Telehealth: Payer: Self-pay | Admitting: *Deleted

## 2023-11-19 ENCOUNTER — Encounter (HOSPITAL_COMMUNITY)
Admission: RE | Admit: 2023-11-19 | Discharge: 2023-11-19 | Disposition: A | Discharge status: Home / Self Care | Source: Ambulatory Visit | Attending: Internal Medicine | Admitting: Internal Medicine

## 2023-11-19 DIAGNOSIS — D508 Other iron deficiency anemias: Secondary | ICD-10-CM | POA: Diagnosis not present

## 2023-11-19 DIAGNOSIS — I480 Paroxysmal atrial fibrillation: Secondary | ICD-10-CM | POA: Insufficient documentation

## 2023-11-19 DIAGNOSIS — Z01812 Encounter for preprocedural laboratory examination: Secondary | ICD-10-CM | POA: Diagnosis present

## 2023-11-19 DIAGNOSIS — Z79899 Other long term (current) drug therapy: Secondary | ICD-10-CM | POA: Insufficient documentation

## 2023-11-19 DIAGNOSIS — C9110 Chronic lymphocytic leukemia of B-cell type not having achieved remission: Secondary | ICD-10-CM | POA: Insufficient documentation

## 2023-11-19 DIAGNOSIS — Z01818 Encounter for other preprocedural examination: Secondary | ICD-10-CM | POA: Insufficient documentation

## 2023-11-19 DIAGNOSIS — Z01810 Encounter for preprocedural cardiovascular examination: Secondary | ICD-10-CM | POA: Diagnosis present

## 2023-11-19 DIAGNOSIS — I10 Essential (primary) hypertension: Secondary | ICD-10-CM | POA: Diagnosis not present

## 2023-11-19 LAB — CBC WITH DIFFERENTIAL/PLATELET
Abs Immature Granulocytes: 0 10*3/uL (ref 0.00–0.07)
Basophils Absolute: 0 10*3/uL (ref 0.0–0.1)
Basophils Relative: 0 %
Eosinophils Absolute: 0.3 10*3/uL (ref 0.0–0.5)
Eosinophils Relative: 1 %
HCT: 41.6 % (ref 36.0–46.0)
Hemoglobin: 12.7 g/dL (ref 12.0–15.0)
Lymphocytes Relative: 91 %
Lymphs Abs: 31.6 10*3/uL — ABNORMAL HIGH (ref 0.7–4.0)
MCH: 30.4 pg (ref 26.0–34.0)
MCHC: 30.5 g/dL (ref 30.0–36.0)
MCV: 99.5 fL (ref 80.0–100.0)
Monocytes Absolute: 1.4 10*3/uL — ABNORMAL HIGH (ref 0.1–1.0)
Monocytes Relative: 4 %
Neutro Abs: 1.4 10*3/uL — ABNORMAL LOW (ref 1.7–7.7)
Neutrophils Relative %: 4 %
Platelets: 108 10*3/uL — ABNORMAL LOW (ref 150–400)
RBC: 4.18 MIL/uL (ref 3.87–5.11)
RDW: 14.6 % (ref 11.5–15.5)
WBC: 34.7 10*3/uL — ABNORMAL HIGH (ref 4.0–10.5)
nRBC: 0.1 % (ref 0.0–0.2)

## 2023-11-19 LAB — BASIC METABOLIC PANEL WITH GFR
Anion gap: 8 (ref 5–15)
BUN: 15 mg/dL (ref 8–23)
CO2: 27 mmol/L (ref 22–32)
Calcium: 9.8 mg/dL (ref 8.9–10.3)
Chloride: 106 mmol/L (ref 98–111)
Creatinine, Ser: 0.83 mg/dL (ref 0.44–1.00)
GFR, Estimated: >60 mL/min (ref >60)
Glucose, Bld: 85 mg/dL (ref 70–99)
Potassium: 4 mmol/L (ref 3.5–5.1)
Sodium: 141 mmol/L (ref 135–145)

## 2023-11-19 MED ORDER — CLENPIQ 10-3.5-12 MG-GM -GM/175ML PO SOLN: 1.0000 | ORAL | 0 refills | Status: DC

## 2023-11-19 NOTE — Telephone Encounter (Signed)
VM not set up, unable to leave message

## 2023-11-19 NOTE — Telephone Encounter (Signed)
 Pt has been rescheduled for Friday 12/06/23 at 10:15 am. Updated instructions sent via mychart. Prep resent to pharmacy

## 2023-11-19 NOTE — Telephone Encounter (Signed)
 Cancellation Received: Today Lavell Portugal, RN  Genora Kidd, Alveda Jing, LPN; Rozann Cornell, LPN; Feliz Hosteller, CMA Patient cancelled her procedure for 11/22/23.  She will need to reschedule because she will be out of town.

## 2023-11-25 ENCOUNTER — Other Ambulatory Visit: Payer: Medicare Other

## 2023-11-25 ENCOUNTER — Ambulatory Visit: Payer: Medicare Other

## 2023-11-27 ENCOUNTER — Encounter: Payer: Self-pay | Admitting: Cardiology

## 2023-11-27 ENCOUNTER — Ambulatory Visit: Attending: Cardiology | Admitting: Cardiology

## 2023-11-27 VITALS — BP 130/58 | HR 66 | Ht 67.0 in | Wt 241.0 lb

## 2023-11-27 DIAGNOSIS — I10 Essential (primary) hypertension: Secondary | ICD-10-CM | POA: Diagnosis present

## 2023-11-27 DIAGNOSIS — I480 Paroxysmal atrial fibrillation: Secondary | ICD-10-CM | POA: Diagnosis present

## 2023-11-27 MED ORDER — APIXABAN 5 MG PO TABS: 5.0000 mg | ORAL_TABLET | Freq: Two times a day (BID) | ORAL | 2 refills | Status: AC

## 2023-11-27 NOTE — Patient Instructions (Addendum)

## 2023-11-27 NOTE — Progress Notes (Signed)
    Cardiology Office Note  Date: 11/27/2023   ID: Elda, Hardmon [birth year], MRN [id number]  History of Present Illness: Daisy Black is a 73 y.o. female last seen in July 2024.  She is here for a routine visit.  Reports no palpitations or unusual shortness of breath with typical activities, no dizziness or syncope.  She is scheduled for an EGD and colonoscopy in early May, will be off Eliquis  48 hours prior.  We went over her medications.  She reports compliance with therapy.  No spontaneous bleeding problems on Eliquis .  I reviewed her recent lab work and ECG.  Physical Exam: VS:  BP (!) 130/58 (BP Location: Left Arm, Cuff Size: Large)   Pulse 66   Ht 5\' 7"  (1.702 m)   Wt 241 lb (109.3 kg)   SpO2 98%   BMI 37.75 kg/m , BMI Body mass index is 37.75 kg/m.  Wt Readings from Last 3 Encounters:  11/27/23 241 lb (109.3 kg)  09/23/23 238 lb 9.6 oz (108.2 kg)  08/27/23 237 lb 4.8 oz (107.6 kg)    General: Patient appears comfortable at rest. HEENT: Conjunctiva and lids normal. Lungs: Clear to auscultation, nonlabored breathing at rest. Cardiac: Regular rate and rhythm, no S3 or significant systolic murmur.  ECG:  An ECG dated 11/19/2023 was personally reviewed today and demonstrated:  Sinus rhythm with prolonged PR interval and PACs.  Labwork: 07/04/2023: ALT 18; AST 18 11/19/2023: BUN 15; Creatinine, Ser 0.83; Hemoglobin 12.7; Platelets 108; Potassium 4.0; Sodium 141   Other Studies Reviewed Today:  No interval cardiac testing for review today.  Assessment and Plan:  1.  Paroxysmal to persistent atrial fibrillation with CHA2DS2-VASc score of 3.  Doing very well with no interval palpitations.  Recent ECG shows sinus rhythm.  Continue amiodarone  200 mg daily for rhythm suppression, also on Lopressor  25 mg twice daily and Eliquis  5 mg twice daily.   2.  Primary hypertension.  No change in current regimen.  Keep follow-up with PCP.  Disposition:  Follow up  6  months.  Signed, Gerard Knight, M.D., F.A.C.C. [facility]

## 2023-12-02 ENCOUNTER — Ambulatory Visit: Payer: Medicare Other | Admitting: Oncology

## 2023-12-03 ENCOUNTER — Other Ambulatory Visit: Payer: Self-pay | Admitting: Cardiology

## 2023-12-04 ENCOUNTER — Encounter: Payer: Self-pay | Admitting: *Deleted

## 2023-12-04 ENCOUNTER — Encounter (HOSPITAL_COMMUNITY): Payer: Self-pay

## 2023-12-04 ENCOUNTER — Other Ambulatory Visit: Payer: Self-pay

## 2023-12-04 ENCOUNTER — Encounter (HOSPITAL_COMMUNITY)
Admission: RE | Admit: 2023-12-04 | Discharge: 2023-12-04 | Disposition: A | Discharge status: Home / Self Care | Source: Ambulatory Visit | Attending: Internal Medicine | Admitting: Internal Medicine

## 2023-12-06 ENCOUNTER — Ambulatory Visit (HOSPITAL_COMMUNITY): Admitting: Certified Registered"

## 2023-12-06 ENCOUNTER — Encounter (HOSPITAL_COMMUNITY): Payer: Self-pay | Admitting: Internal Medicine

## 2023-12-06 ENCOUNTER — Other Ambulatory Visit: Payer: Self-pay

## 2023-12-06 ENCOUNTER — Ambulatory Visit (HOSPITAL_COMMUNITY)
Admission: RE | Admit: 2023-12-06 | Discharge: 2023-12-06 | Disposition: A | Discharge status: Home / Self Care | Attending: Internal Medicine | Admitting: Internal Medicine

## 2023-12-06 ENCOUNTER — Encounter (HOSPITAL_COMMUNITY)
Admission: RE | Disposition: A | Discharge status: Home / Self Care | Payer: Self-pay | Source: Home / Self Care | Attending: Internal Medicine

## 2023-12-06 DIAGNOSIS — Z1211 Encounter for screening for malignant neoplasm of colon: Secondary | ICD-10-CM | POA: Insufficient documentation

## 2023-12-06 DIAGNOSIS — K5730 Diverticulosis of large intestine without perforation or abscess without bleeding: Secondary | ICD-10-CM

## 2023-12-06 DIAGNOSIS — Z87891 Personal history of nicotine dependence: Secondary | ICD-10-CM | POA: Diagnosis not present

## 2023-12-06 DIAGNOSIS — I110 Hypertensive heart disease with heart failure: Secondary | ICD-10-CM | POA: Insufficient documentation

## 2023-12-06 DIAGNOSIS — K449 Diaphragmatic hernia without obstruction or gangrene: Secondary | ICD-10-CM | POA: Insufficient documentation

## 2023-12-06 DIAGNOSIS — R1314 Dysphagia, pharyngoesophageal phase: Secondary | ICD-10-CM | POA: Insufficient documentation

## 2023-12-06 DIAGNOSIS — K219 Gastro-esophageal reflux disease without esophagitis: Secondary | ICD-10-CM | POA: Diagnosis not present

## 2023-12-06 DIAGNOSIS — I10 Essential (primary) hypertension: Secondary | ICD-10-CM

## 2023-12-06 DIAGNOSIS — E039 Hypothyroidism, unspecified: Secondary | ICD-10-CM | POA: Insufficient documentation

## 2023-12-06 DIAGNOSIS — K222 Esophageal obstruction: Secondary | ICD-10-CM

## 2023-12-06 DIAGNOSIS — Z860100 Personal history of colon polyps, unspecified: Secondary | ICD-10-CM | POA: Insufficient documentation

## 2023-12-06 DIAGNOSIS — I509 Heart failure, unspecified: Secondary | ICD-10-CM | POA: Insufficient documentation

## 2023-12-06 SURGERY — COLONOSCOPY
Anesthesia: General

## 2023-12-06 MED ORDER — LACTATED RINGERS IV SOLN: INTRAVENOUS | Status: DC | PRN

## 2023-12-06 MED ORDER — EPHEDRINE SULFATE-NACL 50-0.9 MG/10ML-% IV SOSY
PREFILLED_SYRINGE | INTRAVENOUS | Status: DC | PRN
  Administered 2023-12-06 (×4): 5 mg via INTRAVENOUS

## 2023-12-06 MED ORDER — PROPOFOL 10 MG/ML IV BOLUS
INTRAVENOUS | Status: DC | PRN
Start: 2023-12-06 — End: 2023-12-06
  Administered 2023-12-06: 100 ug/kg/min via INTRAVENOUS
  Administered 2023-12-06: 100 mg via INTRAVENOUS

## 2023-12-06 MED ORDER — LIDOCAINE 2% (20 MG/ML) 5 ML SYRINGE
INTRAMUSCULAR | Status: DC | PRN
  Administered 2023-12-06: 100 mg via INTRAVENOUS

## 2023-12-06 NOTE — Anesthesia Postprocedure Evaluation (Signed)
 Anesthesia Post Note  Patient: Daisy Black  Procedure(s) Performed: COLONOSCOPY EGD (ESOPHAGOGASTRODUODENOSCOPY) DILATION, ESOPHAGUS  Patient location during evaluation: Phase II Anesthesia Type: General Level of consciousness: awake Pain management: pain level controlled Vital Signs Assessment: post-procedure vital signs reviewed and stable Respiratory status: spontaneous breathing and respiratory function stable Cardiovascular status: blood pressure returned to baseline and stable Postop Assessment: no headache and no apparent nausea or vomiting Anesthetic complications: no Comments: Late entry   No notable events documented.   Last Vitals:  Vitals:   12/06/23 0932 12/06/23 1055  BP: (!) 117/47 (!) 107/54  Pulse: (!) 59 61  Resp: 18 17  Temp: 36.9 C 36.4 C  SpO2: 100% 100%    Last Pain:  Vitals:   12/06/23 1055  TempSrc: Oral  PainSc: 0-No pain                 Coretha Dew

## 2023-12-06 NOTE — H&P (Signed)
 @LOGO @   Primary Care Physician:  Caresse Chant, FNP Primary Gastroenterologist:  Dr. Riley Cheadle  Pre-Procedure History & Physical: HPI:  Daisy Black is a 73 y.o. female here for here for further evaluation of esophageal dysphagia.  History of colonic adenomas.  Here for surveillance colonoscopy as well.  No Eliquis  since last week.  Past Medical History:  Diagnosis Date   Anemia    Arthritis    Asthma    Atrial fibrillation (HCC)    CLL (chronic lymphocytic leukemia) (HCC)    Diverticulitis    Essential hypertension    GERD (gastroesophageal reflux disease)    History of hiatal hernia    Hyperlipidemia    Paroxysmal atrial fibrillation (HCC)    Splenomegaly     Past Surgical History:  Procedure Laterality Date   ANKLE SURGERY Right    repair fracture   BIOPSY  02/28/2022   Procedure: BIOPSY;  Surgeon: Suzette Espy, MD;  [facility];  Service: Endoscopy;;  gastric   CARDIOVERSION N/A 03/20/2016   Procedure: CARDIOVERSION;  Surgeon: Gerard Knight, MD;  Location: [facility];  Service: Cardiovascular;  Laterality: N/A;   CARDIOVERSION N/A 05/22/2016   Procedure: CARDIOVERSION;  Surgeon: Gerard Knight, MD;  Location: [facility];  Service: Cardiovascular;  Laterality: N/A;   CARDIOVERSION N/A 11/20/2021   Procedure: CARDIOVERSION;  Surgeon: Jann Melody, MD;  Location: [facility];  Service: Cardiovascular;  Laterality: N/A;   CARDIOVERSION N/A 11/24/2021   Procedure: CARDIOVERSION;  Surgeon: Gerard Knight, MD;  Location: [facility];  Service: Cardiovascular;  Laterality: N/A;   CHOLECYSTECTOMY     COLONOSCOPY WITH PROPOFOL  N/A 03/27/2018   Internal hemorrhoids, diverticulosis in sigmoid and descending colon, three 4-6 mm polyps at IC valve. tubular adenomas 5 year surveillance   ESOPHAGOGASTRODUODENOSCOPY (EGD) WITH PROPOFOL  N/A 03/27/2018   Moderate Schatzki's ring s/p dilation, small hiatal hernia   ESOPHAGOGASTRODUODENOSCOPY (EGD) WITH PROPOFOL  N/A  02/28/2022   Procedure: ESOPHAGOGASTRODUODENOSCOPY (EGD) WITH PROPOFOL ;  Surgeon: Suzette Espy, MD;  [facility]: [facility];  Service: Endoscopy;  Laterality: N/A;  8:15am   HERNIA REPAIR  2011   Incisional and umbilical utilizing mesh   MALONEY DILATION N/A 03/27/2018   Procedure: Londa Rival DILATION;  Surgeon: Suzette Espy, MD;  [facility];  Service: Endoscopy;  Laterality: N/A;   MALONEY DILATION N/A 02/28/2022   Procedure: Londa Rival DILATION;  Surgeon: Suzette Espy, MD;  [facility];  Service: Endoscopy;  Laterality: N/A;   POLYPECTOMY  03/27/2018   Procedure: POLYPECTOMY;  Surgeon: Suzette Espy, MD;  [facility];  Service: Endoscopy;;  colon   ROTATOR CUFF REPAIR Left    TEE WITHOUT CARDIOVERSION N/A 01/17/2016   Procedure: TRANSESOPHAGEAL ECHOCARDIOGRAM (TEE);  Surgeon: Flavia Hughs, MD;  Location: [facility];  Service: Cardiovascular;  Laterality: N/A;   TEE WITHOUT CARDIOVERSION N/A 02/21/2016   Procedure: TRANSESOPHAGEAL ECHOCARDIOGRAM (TEE) WITH PROPOFOL ;  Surgeon: Flavia Hughs, MD;  Location: [facility];  Service: Cardiovascular;  Laterality: N/A;   TEE WITHOUT CARDIOVERSION N/A 03/20/2016   Procedure: TRANSESOPHAGEAL ECHOCARDIOGRAM (TEE) WITH PROPOFOL ;  Surgeon: Gerard Knight, MD;  Location: [facility];  Service: Cardiovascular;  Laterality: N/A;   TONSILLECTOMY     TOTAL KNEE ARTHROPLASTY  2022    Prior to Admission medications   Medication Sig Start Date End Date Taking? Authorizing Provider  amiodarone  (PACERONE ) 200 MG tablet TAKE 1 TABLET (200 MG TOTAL) BY MOUTH DAILY. SCHEDULE APPOINTMENT  WITH DOCTOR FOR MORE REFILLS 11/04/23  Yes Gerard Knight, MD  apixaban  (ELIQUIS ) 5 MG TABS tablet Take 1 tablet (5 mg total) by mouth 2 (two) times daily. 11/27/23  Yes Gerard Knight, MD  esomeprazole  (NEXIUM ) 40 MG capsule TAKE 1 CAPSULE (40 MG TOTAL) BY MOUTH 2 (TWO) TIMES DAILY BEFORE A MEAL. 10/02/23  Yes Lanney Pitts, PA-C   levothyroxine  (SYNTHROID , LEVOTHROID) 125 MCG tablet Take 125 mcg by mouth daily before breakfast.   Yes [provider]  metoprolol  tartrate (LOPRESSOR ) 25 MG tablet TAKE 1 TABLET (25 MG TOTAL) BY MOUTH 2 (TWO) TIMES DAILY. 12/03/23  Yes Gerard Knight, MD  acetaminophen  (TYLENOL ) 650 MG CR tablet Take 650-1,300 mg by mouth daily as needed for pain.    [provider]  Chlorpheniramine Maleate (CHLOR-TABLETS PO) Take 2 tablets by mouth daily as needed (allergies).    [provider]  docusate sodium  (COLACE) 100 MG capsule Take 200 mg by mouth every morning.    [provider]  famotidine  (PEPCID ) 20 MG tablet Take 1 tablet (20 mg total) by mouth 2 (two) times daily as needed for heartburn or indigestion. 09/23/23   Lanney Pitts, PA-C  fluconazole  (DIFLUCAN ) 150 MG tablet Take 1 tablet (150 mg total) by mouth once as needed for up to 1 dose (yeast infection). 07/04/23   Jerilynn Montenegro, MD  furosemide  (LASIX ) 20 MG tablet TAKE TWO TABLETS BY MOUTH DAILY AS NEEDED FOR FLUID 07/03/23   Gerard Knight, MD  HYDROcodone -acetaminophen  (NORCO) 5-325 MG tablet Take 1 tablet by mouth every 6 (six) hours as needed for severe pain (pain score 7-10). 08/27/23   Shellie Dials, MD  lidocaine  (XYLOCAINE ) 2 % solution 10mL PO every 8 hours as needed for esophageal pain 09/23/23   Lanney Pitts, PA-C  nystatin  cream (MYCOSTATIN ) Apply 1 Application topically 2 (two) times daily. 08/12/23   [provider]  promethazine  (PHENERGAN ) 25 MG tablet Take 1 tablet (25 mg total) by mouth every 6 (six) hours as needed for nausea or vomiting. 05/10/22   Shellie Dials, MD  Sod Picosulfate-Mag Ox-Cit Acd (CLENPIQ ) 10-3.5-12 MG-GM -GM/175ML SOLN Take 1 kit by mouth as directed. 11/19/23   Akoni Parton, Windsor Hatcher, MD  sucralfate  (CARAFATE ) 1 g tablet Take 1 tablet (1 g total) by mouth 4 (four) times daily -  with meals and at bedtime. 08/27/22   Eustacio Highman, NP     Allergies as of 10/03/2023 - Review Complete 09/23/2023  Allergen Reaction Noted   Flagyl [metronidazole] Other (See Comments) 05/01/2019    Family History  Problem Relation Age of Onset   Ovarian cancer Mother 67       Secondary to ovarian cancer   Leukemia Mother    Stroke Father 26       Brain stem infarction   Breast cancer Paternal Aunt    Crohn's disease Son    Colon cancer Neg Hx     Social History   Socioeconomic History   Marital status: Married    Spouse name: Not on file   Number of children: 9   Years of education: Not on file   Highest education level: Not on file  Occupational History   Not on file  Tobacco Use   Smoking status: Former    Current packs/day: 0.00    Average packs/day: 0.3 packs/day for 2.0 years (0.5 ttl pk-yrs)    Types: Cigarettes    Start date: 08/02/1973  Quit date: 08/03/1975    Years since quitting: 48.3   Smokeless tobacco: Never  Vaping Use   Vaping status: Never Used  Substance and Sexual Activity   Alcohol use: No    Alcohol/week: 0.0 standard drinks of alcohol   Drug use: No   Sexual activity: Yes    Birth control/protection: Post-menopausal  Other Topics Concern   Not on file  Social History Narrative   Not on file   Social Drivers of Health   Financial Resource Strain: Not on file  Food Insecurity: Not on file  Transportation Needs: Not on file  Physical Activity: Not on file  Stress: Not on file  Social Connections: Not on file  Intimate Partner Violence: Not on file    Review of Systems: See HPI, otherwise negative ROS  Physical Exam: BP (!) 117/47   Pulse (!) 59   Temp 98.4 F (36.9 C) (Oral)   Resp 18   Ht 5\' 7"  (1.702 m)   Wt 109 kg   SpO2 100%   BMI 37.64 kg/m  General:   Alert,  Well-developed, well-nourished, pleasant and cooperative in NAD opathy. Lungs:  Clear throughout to auscultation.   No wheezes, crackles, or rhonchi. No acute distress. Heart:  Regular rate and rhythm; no  murmurs, clicks, rubs,  or gallops. Abdomen: Non-distended, normal bowel sounds.  Soft and nontender without appreciable mass or hepatosplenomegaly.  Impression/Plan:    73 year old lady with dysphagia history of Schatzki's ring.  History of colonic adenoma.  Appear for EGD is feasible/appropriate and a surveillance colonoscopy per plan.  The risks, benefits, limitations, imponderables and alternatives regarding both EGD and colonoscopy have been reviewed with the patient. Questions have been answered. All parties agreeable.       Notice: This dictation was prepared with Dragon dictation along with smaller phrase technology. Any transcriptional errors that result from this process are unintentional and may not be corrected upon review.

## 2023-12-06 NOTE — Op Note (Signed)
 [facility] Patient Name: Daisy Black Procedure Date: 12/06/2023 9:34 AM MRN: [id number] Date of Birth: Jul 30, 1951 Attending MD: Gemma Kelp , MD, [id number] CSN: [id number] Age: 73 Admit Type: Outpatient Procedure:                Colonoscopy Indications:              High risk colon cancer surveillance: Personal                            history of colonic polyps Providers:                Gemma Kelp, MD, Pasco Bond, RN, Annell Barrow Referring MD:              Medicines:                Propofol  per Anesthesia Complications:            No immediate complications. Estimated Blood Loss:     Estimated blood loss: none. Procedure:                Pre-Anesthesia Assessment:                           - Prior to the procedure, a History and Physical                            was performed, and patient medications and                            allergies were reviewed. The patient's tolerance of                            previous anesthesia was also reviewed. The risks                            and benefits of the procedure and the sedation                            options and risks were discussed with the patient.                            All questions were answered, and informed consent                            was obtained. Prior Anticoagulants: The patient has                            taken no anticoagulant or antiplatelet agents. ASA                            Grade Assessment: III - A patient with severe  systemic disease. After reviewing the risks and                            benefits, the patient was deemed in satisfactory                            condition to undergo the procedure.                           After obtaining informed consent, the colonoscope                            was passed under direct vision. Throughout the                            procedure, the patient's blood  pressure, pulse, and                            oxygen saturations were monitored continuously. The                            [id number]) scope was introduced through the                            anus and advanced to the the cecum, identified by                            appendiceal orifice and ileocecal valve. The                            colonoscopy was performed without difficulty. The                            patient tolerated the procedure well. The quality                            of the bowel preparation was adequate. The                            ileocecal valve, appendiceal orifice, and rectum                            were photographed. The entire colon was well                            visualized. The patient tolerated the procedure                            well. Scope In: 10:36:29 AM Scope Out: 10:47:30 AM Scope Withdrawal Time: 0 hours 8 minutes 37 seconds  Total Procedure Duration: 0 hours 11 minutes 1 second  Findings:      The perianal and digital rectal examinations were normal.      Scattered medium-mouthed diverticula were found in the sigmoid colon and  descending colon.      The exam was otherwise without abnormality on direct and retroflexion       views. Impression:               - Diverticulosis in the sigmoid colon and in the                            descending colon.                           - The examination was otherwise normal on direct                            and retroflexion views.                           - No specimens collected. Moderate Sedation:      Moderate (conscious) sedation was personally administered by an       anesthesia professional. The following parameters were monitored: oxygen       saturation, heart rate, blood pressure, respiratory rate, EKG, adequacy       of pulmonary ventilation, and response to care. Recommendation:           - Patient has a contact number available for                             emergencies. The signs and symptoms of potential                            delayed complications were discussed with the                            patient. Return to normal activities tomorrow.                            Written discharge instructions were provided to the                            patient.                           - Advance diet as tolerated.                           - Continue present medications.                           - Patient has a contact number available for                            emergencies. The signs and symptoms of potential                            delayed complications were discussed with the                            patient.  Return to normal activities tomorrow.                            Written discharge instructions were provided to the                            patient.                           - No repeat colonoscopy due to age.                           - Return to GI clinic in 3 months. See EGD report Procedure Code(s):        --- Professional ---                           [phone], Colonoscopy, flexible; diagnostic, including                            collection of specimen(s) by brushing or washing,                            when performed (separate procedure) Diagnosis Code(s):        --- Professional ---                           Z86.010, Personal history of colonic polyps                           K57.30, Diverticulosis of large intestine without                            perforation or abscess without bleeding CPT copyright 2022 American [employer] Association. All rights reserved. The codes documented in this report are preliminary and upon coder review may  be revised to meet current compliance requirements. Windsor Hatcher. Apolonio Cutting, MD Gemma Kelp, MD 12/06/2023 10:53:49 AM This report has been signed electronically. Number of Addenda: 0

## 2023-12-06 NOTE — Discharge Instructions (Addendum)
 EGD Discharge instructions Please read the instructions outlined below and refer to this sheet in the next few weeks. These discharge instructions provide you with general information on caring for yourself after you leave the hospital. Your doctor may also give you specific instructions. While your treatment has been planned according to the most current medical practices available, unavoidable complications occasionally occur. If you have any problems or questions after discharge, please call your doctor. ACTIVITY You may resume your regular activity but move at a slower pace for the next 24 hours.  Take frequent rest periods for the next 24 hours.  Walking will help expel (get rid of) the air and reduce the bloated feeling in your abdomen.  No driving for 24 hours (because of the anesthesia (medicine) used during the test).  You may shower.  Do not sign any important legal documents or operate any machinery for 24 hours (because of the anesthesia used during the test).  NUTRITION Drink plenty of fluids.  You may resume your normal diet.  Begin with a light meal and progress to your normal diet.  Avoid alcoholic beverages for 24 hours or as instructed by your caregiver.  MEDICATIONS You may resume your normal medications unless your caregiver tells you otherwise.  WHAT YOU CAN EXPECT TODAY You may experience abdominal discomfort such as a feeling of fullness or "gas" pains.  FOLLOW-UP Your doctor will discuss the results of your test with you.  SEEK IMMEDIATE MEDICAL ATTENTION IF ANY OF THE FOLLOWING OCCUR: Excessive nausea (feeling sick to your stomach) and/or vomiting.  Severe abdominal pain and distention (swelling).  Trouble swallowing.  Temperature over 101 F (37.8 C).  Rectal bleeding or vomiting of blood.    Colonoscopy Discharge Instructions  Read the instructions outlined below and refer to this sheet in the next few weeks. These discharge instructions provide you with  general information on caring for yourself after you leave the hospital. Your doctor may also give you specific instructions. While your treatment has been planned according to the most current medical practices available, unavoidable complications occasionally occur. If you have any problems or questions after discharge, call Dr. Riley Cheadle at [phone]. ACTIVITY You may resume your regular activity, but move at a slower pace for the next 24 hours.  Take frequent rest periods for the next 24 hours.  Walking will help get rid of the air and reduce the bloated feeling in your belly (abdomen).  No driving for 24 hours (because of the medicine (anesthesia) used during the test).   Do not sign any important legal documents or operate any machinery for 24 hours (because of the anesthesia used during the test).  NUTRITION Drink plenty of fluids.  You may resume your normal diet as instructed by your doctor.  Begin with a light meal and progress to your normal diet. Heavy or fried foods are harder to digest and may make you feel sick to your stomach (nauseated).  Avoid alcoholic beverages for 24 hours or as instructed.  MEDICATIONS You may resume your normal medications unless your doctor tells you otherwise.  WHAT YOU CAN EXPECT TODAY Some feelings of bloating in the abdomen.  Passage of more gas than usual.  Spotting of blood in your stool or on the toilet paper.  IF YOU HAD POLYPS REMOVED DURING THE COLONOSCOPY: No aspirin  products for 7 days or as instructed.  No alcohol for 7 days or as instructed.  Eat a soft diet for the next 24 hours.  FINDING  OUT THE RESULTS OF YOUR TEST Not all test results are available during your visit. If your test results are not back during the visit, make an appointment with your caregiver to find out the results. Do not assume everything is normal if you have not heard from your caregiver or the medical facility. It is important for you to follow up on all of your test  results.  SEEK IMMEDIATE MEDICAL ATTENTION IF: You have more than a spotting of blood in your stool.  Your belly is swollen (abdominal distention).  You are nauseated or vomiting.  You have a temperature over 101.  You have abdominal pain or discomfort that is severe or gets worse throughout the day.      Your esophagus was stretched today.    Found diverticulosis only in your colon.  A future colonoscopy is not recommended unless new symptoms develop  Office visit with Azalee Lenz in 4 to 6 weeks  At patient request, I called Michael at [phone]-  call rolled to voicemail.  "Voicemail not set up"

## 2023-12-06 NOTE — Op Note (Signed)
 [facility] Patient Name: Daisy Black Procedure Date: 12/06/2023 9:36 AM MRN: [id number] Date of Birth: January 24, 1951 Attending MD: Gemma Kelp , MD, [id number] CSN: [id number] Age: 73 Admit Type: Outpatient Procedure:                Upper GI endoscopy Indications:              Dysphagia Providers:                Gemma Kelp, MD, Pasco Bond, RN, Annell Barrow Referring MD:              Medicines:                Propofol  per Anesthesia Complications:            No immediate complications. Estimated Blood Loss:     Estimated blood loss: none. Procedure:                Pre-Anesthesia Assessment:                           - Prior to the procedure, a History and Physical                            was performed, and patient medications and                            allergies were reviewed. The patient's tolerance of                            previous anesthesia was also reviewed. The risks                            and benefits of the procedure and the sedation                            options and risks were discussed with the patient.                            All questions were answered, and informed consent                            was obtained. Prior Anticoagulants: The patient has                            taken no anticoagulant or antiplatelet agents. ASA                            Grade Assessment: III - A patient with severe                            systemic disease. After reviewing the risks and  benefits, the patient was deemed in satisfactory                            condition to undergo the procedure.                           After obtaining informed consent, the endoscope was                            passed under direct vision. Throughout the                            procedure, the patient's blood pressure, pulse, and                            oxygen saturations were monitored  continuously. The                            [id number] ([id number]) scope was introduced through the                            mouth, and advanced to the second part of duodenum.                            The upper GI endoscopy was accomplished without                            difficulty. The patient tolerated the procedure                            well. Scope In: 10:24:48 AM Scope Out: 10:30:48 AM Total Procedure Duration: 0 hours 6 minutes 0 seconds  Findings:      A non-obstructing Schatzki ring was found at the gastroesophageal       junction. Esophageal mucosa otherwise appeared well. The scope was       withdrawn. Dilation was performed with a Maloney dilator with mild       resistance at 56 Fr. The dilation site was examined following endoscope       reinsertion and showed no change. Estimated blood loss was minimal.      A small hiatal hernia was present. Appearing gastric mucosa.      The duodenal bulb and second portion of the duodenum were normal. Impression:               - Non-obstructing Schatzki ring. Dilated.                           - Small hiatal hernia.                           - Normal duodenal bulb and second portion of the                            duodenum.                           -  No specimens collected. Moderate Sedation:      Moderate (conscious) sedation was personally administered by an       anesthesia professional. The following parameters were monitored: oxygen       saturation, heart rate, blood pressure, respiratory rate, EKG, adequacy       of pulmonary ventilation, and response to care. Recommendation:           - Patient has a contact number available for                            emergencies. The signs and symptoms of potential                            delayed complications were discussed with the                            patient. Return to normal activities tomorrow.                            Written discharge instructions were  provided to the                            patient.                           - Advance diet as tolerated.                           - Continue present medications.                           - Return to my office in 3 months. See colonoscopy                            report. Procedure Code(s):        --- Professional ---                           [phone], Esophagogastroduodenoscopy, flexible,                            transoral; diagnostic, including collection of                            specimen(s) by brushing or washing, when performed                            (separate procedure)                           [id number], Dilation of esophagus, by unguided sound or                            bougie, single or multiple passes Diagnosis Code(s):        --- Professional ---  K22.2, Esophageal obstruction                           K44.9, Diaphragmatic hernia without obstruction or                            gangrene                           R13.10, Dysphagia, unspecified CPT copyright 2022 American Medical Association. All rights reserved. The codes documented in this report are preliminary and upon coder review may  be revised to meet current compliance requirements. Windsor Hatcher. Huel Centola, MD Gemma Kelp, MD 12/06/2023 10:49:00 AM This report has been signed electronically. Number of Addenda: 0

## 2023-12-06 NOTE — Transfer of Care (Signed)
 Immediate Anesthesia Transfer of Care Note  Patient: Daisy Black  Procedure(s) Performed: COLONOSCOPY EGD (ESOPHAGOGASTRODUODENOSCOPY) DILATION, ESOPHAGUS  Patient Location: PACU  Anesthesia Type:General  Level of Consciousness: awake, alert , oriented, and patient cooperative  Airway & Oxygen Therapy: Patient Spontanous Breathing  Post-op Assessment: Report given to RN, Post -op Vital signs reviewed and stable, and Patient moving all extremities X 4  Post vital signs: Reviewed and stable  Last Vitals:  Vitals Value Taken Time  BP 107/54   Temp 97.6   Pulse 80   Resp 14   SpO2 98     Last Pain:  Vitals:   12/06/23 0932  TempSrc: Oral  PainSc: 2       Patients Stated Pain Goal: 4 (12/06/23 0932)  Complications: No notable events documented.

## 2023-12-06 NOTE — Anesthesia Preprocedure Evaluation (Signed)
 Anesthesia Evaluation  Patient identified by MRN, date of birth, ID band Patient awake    Reviewed: Allergy & Precautions, H&P , NPO status , Patient's Chart, lab work & pertinent test results, reviewed documented beta blocker date and time   Airway Mallampati: II  TM Distance: >3 FB Neck ROM: full    Dental no notable dental hx.    Pulmonary asthma , former smoker   Pulmonary exam normal breath sounds clear to auscultation       Cardiovascular Exercise Tolerance: Good hypertension, +CHF   Rhythm:regular Rate:Normal     Neuro/Psych  Neuromuscular disease  negative psych ROS   GI/Hepatic Neg liver ROS, hiatal hernia,GERD  ,,  Endo/Other  Hypothyroidism    Renal/GU negative Renal ROS  negative genitourinary   Musculoskeletal   Abdominal   Peds  Hematology  (+) Blood dyscrasia, anemia   Anesthesia Other Findings   Reproductive/Obstetrics negative OB ROS                             Anesthesia Physical Anesthesia Plan  ASA: 3  Anesthesia Plan: General   Post-op Pain Management:    Induction:   PONV Risk Score and Plan: Propofol  infusion  Airway Management Planned:   Additional Equipment:   Intra-op Plan:   Post-operative Plan:   Informed Consent: I have reviewed the patients History and Physical, chart, labs and discussed the procedure including the risks, benefits and alternatives for the proposed anesthesia with the patient or authorized representative who has indicated his/her understanding and acceptance.     Dental Advisory Given  Plan Discussed with: CRNA  Anesthesia Plan Comments:        Anesthesia Quick Evaluation

## 2023-12-09 ENCOUNTER — Encounter (HOSPITAL_COMMUNITY): Payer: Self-pay | Admitting: Internal Medicine

## 2023-12-24 ENCOUNTER — Encounter: Payer: Self-pay | Admitting: Gastroenterology

## 2023-12-24 ENCOUNTER — Ambulatory Visit (INDEPENDENT_AMBULATORY_CARE_PROVIDER_SITE_OTHER): Admitting: Gastroenterology

## 2023-12-24 VITALS — BP 129/76 | HR 64 | Temp 97.9°F | Ht 67.0 in | Wt 237.6 lb

## 2023-12-24 DIAGNOSIS — R1013 Epigastric pain: Secondary | ICD-10-CM

## 2023-12-24 DIAGNOSIS — R110 Nausea: Secondary | ICD-10-CM | POA: Insufficient documentation

## 2023-12-24 DIAGNOSIS — K219 Gastro-esophageal reflux disease without esophagitis: Secondary | ICD-10-CM | POA: Diagnosis not present

## 2023-12-24 DIAGNOSIS — R1310 Dysphagia, unspecified: Secondary | ICD-10-CM

## 2023-12-24 DIAGNOSIS — R1012 Left upper quadrant pain: Secondary | ICD-10-CM | POA: Diagnosis not present

## 2023-12-24 MED ORDER — ESOMEPRAZOLE MAGNESIUM 40 MG PO CPDR: DELAYED_RELEASE_CAPSULE | ORAL | 1 refills | Status: AC

## 2023-12-24 NOTE — Patient Instructions (Addendum)
 Try cutting back on esomeprazole  to 40mg  30 minutes before breakfast. If you have breakthrough heartburn, upper abdominal pain, or worsening nausea you can try taking famotidine  20mg  up to twice daily. I sent in a prescription to [facility] back in 08/2023, they should still have it on file. Please let me know if any of your symptoms worsen, or you develop new symptoms.  Otherwise we will see you back in four months.

## 2023-12-24 NOTE — Progress Notes (Signed)
 GI Office Note    Referring Provider: Caresse Chant, FNP Primary Care Physician:  Caresse Chant, FNP  Primary Gastroenterologist: Rheba Cedar, MD   Chief Complaint   Chief Complaint  Patient presents with   Abdominal Pain    Still having luq pain    History of Present Illness   Daisy Black is a 73 y.o. female presenting today for follow up. Last seen 08/2023.   She has a history of diverticulitis, epigastric pain, LUQ pain, GERD, anemia, asthma, hyperlipidemia, hypertension, A-fib on Eliquis , CLL following at [facility].   Today: recurrent epigastric/LUQ pain back in 01/2023, worsening symptoms around 07/2023. Oncologist did not feel it was related to splenomegaly given stable imaging over several years. She also has rectus diastasis well documented on prior imaging. She notes changing esomeprazole  from before lunch and supper to before breakfast and supper has helped a lot with the burning in her chest. She never had to take famotidine  or lidocaine . She uses sleep number bed and elevates her torso. She continues to have nausea, can be any time of day. Food does not seem to help it. It does limit her oral intake although she also has been trying to lose weight. No dysphagia. No vomiting. Last few days abdominal pain and nausea has been little bit better. Always has had some discomfort luq especially with bending over, feels it is due to her spleen. Discomfort midline above umbilicus she feels is from rectus diastasis, notes with palpation.   CLL for 15 years, has had rituxin twice. Made her feel a lot better.   CT A/P with contrast 07/2023: IMPRESSION: Previous wall thickening and stranding along the proximal sigmoid colon has improved. No new areas identified. Scattered colonic diverticula particularly of the sigmoid colon with circular muscle hypertrophy. No obstruction. Air-fluid levels seen along the nondilated colon.   Again evidence of splenomegaly with  subtle splenic lesion and numerous nodes, similar to previous examination. Please correlate with the history of CLL.  EGD 11/2023: -nonobstructing schatzki ring s/p dilation -small hh -normal duodenal bulb and second portion of duodenum  Colonoscopy 11/2023: -diverticulosis in sigmoid and descending colon  Colonoscopy in August 2019 with internal hemorrhoids, sigmoid and descending diverticulosis, and 3 polyps at the ileocecal valve.  Due for surveillance in 2024.   EGD August 2023: -Mild Schatzki's ring s/p dilation -Gastric erosions of uncertain significance s/p biopsy revealing antral mucosa with focal erosion and reactive changes, no H. Pylori -Normal duodenum -Reported lansoprazole  not helping as much as pantoprazole  (which also was not controlling her reflux symptoms).     Wt Readings from Last 10 Encounters:  12/24/23 237 lb 9.6 oz (107.8 kg)  12/06/23 240 lb 4.8 oz (109 kg)  12/04/23 241 lb (109.3 kg)  11/27/23 241 lb (109.3 kg)  09/23/23 238 lb 9.6 oz (108.2 kg)  08/27/23 237 lb 4.8 oz (107.6 kg)  08/20/23 235 lb (106.6 kg)  05/28/23 242 lb 12.8 oz (110.1 kg)  02/05/23 239 lb 3.2 oz (108.5 kg)  01/28/23 237 lb 9.6 oz (107.8 kg)     Medications   Current Outpatient Medications  Medication Sig Dispense Refill   acetaminophen  (TYLENOL ) 650 MG CR tablet Take 650-1,300 mg by mouth daily as needed for pain.     amiodarone  (PACERONE ) 200 MG tablet TAKE 1 TABLET (200 MG TOTAL) BY MOUTH DAILY. SCHEDULE APPOINTMENT WITH DOCTOR FOR MORE REFILLS 30 tablet 1   apixaban  (ELIQUIS ) 5 MG TABS tablet Take  1 tablet (5 mg total) by mouth 2 (two) times daily. 180 tablet 2   Chlorpheniramine Maleate (CHLOR-TABLETS PO) Take 2 tablets by mouth daily as needed (allergies).     docusate sodium  (COLACE) 100 MG capsule Take 200 mg by mouth every morning.     esomeprazole  (NEXIUM ) 40 MG capsule TAKE 1 CAPSULE (40 MG TOTAL) BY MOUTH 2 (TWO) TIMES DAILY BEFORE A MEAL. 180 capsule 1   famotidine   (PEPCID ) 20 MG tablet Take 1 tablet (20 mg total) by mouth 2 (two) times daily as needed for heartburn or indigestion. 60 tablet 0   furosemide  (LASIX ) 20 MG tablet TAKE TWO TABLETS BY MOUTH DAILY AS NEEDED FOR FLUID 180 tablet 1   HYDROcodone -acetaminophen  (NORCO) 5-325 MG tablet Take 1 tablet by mouth every 6 (six) hours as needed for severe pain (pain score 7-10). 60 tablet 0   levothyroxine  (SYNTHROID , LEVOTHROID) 125 MCG tablet Take 125 mcg by mouth daily before breakfast.     metoprolol  tartrate (LOPRESSOR ) 25 MG tablet TAKE 1 TABLET (25 MG TOTAL) BY MOUTH 2 (TWO) TIMES DAILY. 60 tablet 6   nystatin  cream (MYCOSTATIN ) Apply 1 Application topically 2 (two) times daily.     No current facility-administered medications for this visit.   Facility-Administered Medications Ordered in Other Visits  Medication Dose Route Frequency Provider Last Rate Last Admin   hydrocortisone  cream 1 % 1 application  1 application  Topical TID PRN Gerard Knight, MD        Allergies   Allergies as of 12/24/2023 - Review Complete 12/24/2023  Allergen Reaction Noted   Flagyl [metronidazole] Other (See Comments) 05/01/2019      Review of Systems   General: Negative for anorexia,unintentional weight loss, fever, chills, fatigue, weakness. ENT: Negative for hoarseness, difficulty swallowing , nasal congestion. CV: Negative for chest pain, angina, palpitations, dyspnea on exertion, peripheral edema.  Respiratory: Negative for dyspnea at rest, dyspnea on exertion, cough, sputum, wheezing.  GI: See history of present illness. GU:  Negative for dysuria, hematuria, urinary incontinence, urinary frequency, nocturnal urination.  Endo: Negative for unusual weight change.     Physical Exam   BP 129/76 (BP Location: Right Arm, Patient Position: Sitting, Cuff Size: Large)   Pulse 64   Temp 97.9 F (36.6 C) (Oral)   Ht 5\' 7"  (1.702 m)   Wt 237 lb 9.6 oz (107.8 kg)   SpO2 97%   BMI 37.21 kg/m    General:  Well-nourished, well-developed in no acute distress.  Eyes: No icterus. Mouth: Oropharyngeal mucosa moist and pink  Abdomen: Bowel sounds are normal, nontender, nondistended, no hepatosplenomegaly or masses,  no abdominal bruits or hernia , no rebound or guarding.rectus diastasis, with some tenderness with palpation.  Rectal: not performed  Extremities: No lower extremity edema. No clubbing or deformities. Neuro: Alert and oriented x 4   Skin: Warm and dry, no jaundice.   Psych: Alert and cooperative, normal mood and affect.  Labs   Lab Results  Component Value Date   NA 141 11/19/2023   CL 106 11/19/2023   K 4.0 11/19/2023   CO2 27 11/19/2023   BUN 15 11/19/2023   CREATININE 0.83 11/19/2023   GFRNONAA >60 11/19/2023   CALCIUM 9.8 11/19/2023   PHOS 3.3 11/22/2021   ALBUMIN 4.1 07/04/2023   GLUCOSE 85 11/19/2023   Lab Results  Component Value Date   WBC 34.7 (H) 11/19/2023   HGB 12.7 11/19/2023   HCT 41.6 11/19/2023   MCV 99.5 11/19/2023  PLT 108 (L) 11/19/2023   Lab Results  Component Value Date   ALT 18 07/04/2023   AST 18 07/04/2023   ALKPHOS 102 07/04/2023   BILITOT 1.2 (H) 07/04/2023    Imaging Studies   No results found.  Assessment/Plan:   GERD/dysphagia: doing much better, moving esomeprazole  to before breakfast and evening meal. She did not require lidocaine  or famotidine . She is interested in trying to reduce PPI dose. Will try backing down to once before BF and add famotidine  BID prn breakthrough symptoms. If that does not work, then go back to PPI BID. -reinforced antireflux measures -return ov in four months  Nausea:likely multifactorial. Continue current regimen.   LUQ pain: nothing on colonoscopy or EGD to explain. Reflux symptoms now improved but persistent pain over midline with palpation (likely due to rectus diastasis) and in LUQ. Symptoms overall stable and LUQ slightly improved. Unsure etiology. Oncologist does not feel it is related to her  CLL or splenomegaly. Offered return visit with Dr. Riley Cheadle for evaluation but she is not interested. Mutually agreed to monitor symptoms for now until she sees oncology in July and has repeat imaging.  -return ov in four months -call with worsening or new symptoms      Trudie Fuse. Harles Lied, MHS, PA-C [facility]

## 2024-01-01 ENCOUNTER — Other Ambulatory Visit: Payer: Self-pay | Admitting: Cardiology

## 2024-02-07 ENCOUNTER — Telehealth: Payer: Self-pay | Admitting: Oncology

## 2024-02-07 ENCOUNTER — Other Ambulatory Visit: Payer: Self-pay | Admitting: *Deleted

## 2024-02-07 DIAGNOSIS — C9110 Chronic lymphocytic leukemia of B-cell type not having achieved remission: Secondary | ICD-10-CM

## 2024-02-07 NOTE — Telephone Encounter (Signed)
 Called and spoke w/pt in regards to secure chat received about pt wanting to reschedule CT for closer facility. Rescheduled for [facility] - notified pt.

## 2024-02-07 NOTE — Progress Notes (Signed)
 Ct c

## 2024-02-07 NOTE — Telephone Encounter (Signed)
 Pt called and stated she lives an hour away. She would like to do her 7/21 CT closer to/at [facility] if that is possible.   Please advise and call pt back  Thank you

## 2024-02-13 ENCOUNTER — Other Ambulatory Visit: Payer: Self-pay | Admitting: *Deleted

## 2024-02-13 ENCOUNTER — Telehealth: Payer: Self-pay | Admitting: *Deleted

## 2024-02-13 MED ORDER — HYDROCODONE-ACETAMINOPHEN 5-325 MG PO TABS: 1.0000 | ORAL_TABLET | Freq: Four times a day (QID) | ORAL | 0 refills | Status: DC | PRN

## 2024-02-13 NOTE — Telephone Encounter (Signed)
 The patient says that she has an appointment to see Dr. Jacobo on August 7 and she will not have enough hydrocodone  to that date she says the pain is getting worse and she cannot sleep very good on time she gets nauseated.  The patient said something on top of the hydrocodone  like ibuprofen or Tylenol  to see if any of that would help with the pain.  The last time she got a refill of hydrocodone  was August 27, 2023

## 2024-02-17 ENCOUNTER — Ambulatory Visit

## 2024-02-24 ENCOUNTER — Other Ambulatory Visit

## 2024-02-24 ENCOUNTER — Ambulatory Visit: Admitting: Oncology

## 2024-02-26 ENCOUNTER — Ambulatory Visit (HOSPITAL_COMMUNITY)
Admission: RE | Admit: 2024-02-26 | Discharge: 2024-02-26 | Disposition: A | Discharge status: Home / Self Care | Source: Ambulatory Visit | Attending: Oncology | Admitting: Oncology

## 2024-02-26 DIAGNOSIS — C9110 Chronic lymphocytic leukemia of B-cell type not having achieved remission: Secondary | ICD-10-CM | POA: Insufficient documentation

## 2024-02-26 MED ORDER — IOHEXOL 300 MG/ML  SOLN: 75.0000 mL | Freq: Once | INTRAMUSCULAR | Status: DC | PRN

## 2024-02-26 MED ORDER — IOHEXOL 300 MG/ML  SOLN
100.0000 mL | Freq: Once | INTRAMUSCULAR | Status: AC | PRN
  Administered 2024-02-26: 100 mL via INTRAVENOUS

## 2024-03-03 ENCOUNTER — Inpatient Hospital Stay (HOSPITAL_BASED_OUTPATIENT_CLINIC_OR_DEPARTMENT_OTHER): Admitting: Nurse Practitioner

## 2024-03-03 ENCOUNTER — Encounter: Payer: Self-pay | Admitting: Oncology

## 2024-03-03 ENCOUNTER — Encounter: Payer: Self-pay | Admitting: Nurse Practitioner

## 2024-03-03 ENCOUNTER — Inpatient Hospital Stay: Attending: Nurse Practitioner

## 2024-03-03 VITALS — BP 116/48 | HR 63 | Temp 98.6°F | Resp 20 | Wt 242.2 lb

## 2024-03-03 DIAGNOSIS — D696 Thrombocytopenia, unspecified: Secondary | ICD-10-CM | POA: Diagnosis not present

## 2024-03-03 DIAGNOSIS — C9110 Chronic lymphocytic leukemia of B-cell type not having achieved remission: Secondary | ICD-10-CM | POA: Diagnosis not present

## 2024-03-03 DIAGNOSIS — R161 Splenomegaly, not elsewhere classified: Secondary | ICD-10-CM | POA: Insufficient documentation

## 2024-03-03 LAB — CBC WITH DIFFERENTIAL/PLATELET
Basophils Absolute: 0 K/uL (ref 0.0–0.1)
Basophils Relative: 0 %
Eosinophils Absolute: 0 K/uL (ref 0.0–0.5)
Eosinophils Relative: 0 %
HCT: 41 % (ref 36.0–46.0)
Hemoglobin: 13 g/dL (ref 12.0–15.0)
Lymphocytes Relative: 88 %
Lymphs Abs: 32.1 K/uL — ABNORMAL HIGH (ref 0.7–4.0)
MCH: 31.2 pg (ref 26.0–34.0)
MCHC: 31.7 g/dL (ref 30.0–36.0)
MCV: 98.3 fL (ref 80.0–100.0)
Monocytes Absolute: 0.7 K/uL (ref 0.1–1.0)
Monocytes Relative: 2 %
Neutro Abs: 3.7 K/uL (ref 1.7–7.7)
Neutrophils Relative %: 10 %
Platelets: 104 K/uL — ABNORMAL LOW (ref 150–400)
RBC: 4.17 MIL/uL (ref 3.87–5.11)
RDW: 15.5 % (ref 11.5–15.5)
WBC: 36.5 K/uL — ABNORMAL HIGH (ref 4.0–10.5)
nRBC: 0 % (ref 0.0–0.2)

## 2024-03-03 LAB — CMP (CANCER CENTER ONLY)
ALT: 18 U/L (ref 0–44)
AST: 19 U/L (ref 15–41)
Albumin: 4 g/dL (ref 3.5–5.0)
Alkaline Phosphatase: 90 U/L (ref 38–126)
Anion gap: 6 (ref 5–15)
BUN: 23 mg/dL (ref 8–23)
CO2: 27 mmol/L (ref 22–32)
Calcium: 10.1 mg/dL (ref 8.9–10.3)
Chloride: 109 mmol/L (ref 98–111)
Creatinine: 0.97 mg/dL (ref 0.44–1.00)
GFR, Estimated: >60 mL/min (ref >60)
Glucose, Bld: 89 mg/dL (ref 70–99)
Potassium: 4.3 mmol/L (ref 3.5–5.1)
Sodium: 142 mmol/L (ref 135–145)
Total Bilirubin: 0.7 mg/dL (ref 0.0–1.2)
Total Protein: 6.2 g/dL — ABNORMAL LOW (ref 6.5–8.1)

## 2024-03-03 MED ORDER — HYDROCODONE-ACETAMINOPHEN 5-325 MG PO TABS: 1.0000 | ORAL_TABLET | Freq: Four times a day (QID) | ORAL | 0 refills | Status: AC | PRN

## 2024-03-03 MED ORDER — PROCHLORPERAZINE MALEATE 10 MG PO TABS: 10.0000 mg | ORAL_TABLET | Freq: Four times a day (QID) | ORAL | 0 refills | Status: AC | PRN

## 2024-03-03 NOTE — Progress Notes (Signed)
 [facility]  [phone]) [phone] [phone]) [phone]  ID: Daisy Black OB: 06-08-1951  MR#: [id number]  RDW#:[id number]  Patient Care Team: Myra Geni ORN, FNP as PCP - General (Family Medicine) Debera Jayson MATSU, MD as PCP - Cardiology (Cardiology) Mavis Anes, MD (General Surgery) Cindie Jesusa CHRISTELLA, RN as Registered Nurse Dannielle Arlean FALCON, RN (Inactive) as Registered Nurse Debera Jayson MATSU, MD as Consulting Physician (Cardiology) Shaaron Lamar CHRISTELLA, MD as Consulting Physician (Gastroenterology) Jacobo Evalene PARAS, MD as Consulting Physician (Hematology and Oncology) Myra Geni ORN, FNP (Family Medicine)  CHIEF COMPLAINT: CLL  INTERVAL HISTORY: Patient returns to clinic today for further evaluation and discussion of her recent imaging results.  She complains of worsening fatigue and more frequent abdominal pain and nausea.  She is requiring more frequent dosing of Compazine  and hydrocodone .  She denies any neurologic complaints.  Denies fevers, chills, night sweats.  She has no chest pain or shortness of breath.  Denies cough or hemoptysis.  She denies vomiting, constipation, diarrhea.  Denies urinary complaints.  No further specific complaints today.  REVIEW OF SYSTEMS:   Review of Systems  Constitutional:  Positive for malaise/fatigue. Negative for diaphoresis, fever and weight loss.  Respiratory: Negative.  Negative for cough and shortness of breath.   Cardiovascular:  Negative for chest pain and leg swelling.  Gastrointestinal:  Positive for nausea. Negative for abdominal pain, blood in stool, constipation, diarrhea, heartburn, melena and vomiting.  Genitourinary:  Positive for flank pain. Negative for dysuria.  Musculoskeletal:  Negative for back pain, falls and joint pain.  Skin: Negative.  Negative for rash.  Neurological: Negative.  Negative for dizziness, sensory change, focal weakness, weakness and headaches.  Psychiatric/Behavioral: Negative.  The  patient is not nervous/anxious.   As per HPI. Otherwise, a complete review of systems is negative.  PAST MEDICAL HISTORY: Past Medical History:  Diagnosis Date   Anemia    Arthritis    Asthma    Atrial fibrillation (HCC)    CLL (chronic lymphocytic leukemia) (HCC)    Diverticulitis    Essential hypertension    GERD (gastroesophageal reflux disease)    History of hiatal hernia    Hyperlipidemia    Paroxysmal atrial fibrillation (HCC)    Splenomegaly     PAST SURGICAL HISTORY: Past Surgical History:  Procedure Laterality Date   ANKLE SURGERY Right    repair fracture   BIOPSY  02/28/2022   Procedure: BIOPSY;  Surgeon: Shaaron Lamar CHRISTELLA, MD;  [facility]: [facility];  Service: Endoscopy;;  gastric   CARDIOVERSION N/A 03/20/2016   Procedure: CARDIOVERSION;  Surgeon: Jayson MATSU Debera, MD;  Location: [facility];  Service: Cardiovascular;  Laterality: N/A;   CARDIOVERSION N/A 05/22/2016   Procedure: CARDIOVERSION;  Surgeon: Jayson MATSU Debera, MD;  Location: [facility];  Service: Cardiovascular;  Laterality: N/A;   CARDIOVERSION N/A 11/20/2021   Procedure: CARDIOVERSION;  Surgeon: Santo Stanly LABOR, MD;  Location: [facility];  Service: Cardiovascular;  Laterality: N/A;   CARDIOVERSION N/A 11/24/2021   Procedure: CARDIOVERSION;  Surgeon: Debera Jayson MATSU, MD;  Location: [facility];  Service: Cardiovascular;  Laterality: N/A;   CHOLECYSTECTOMY     COLONOSCOPY N/A 12/06/2023   Procedure: COLONOSCOPY;  Surgeon: Shaaron Lamar CHRISTELLA, MD;  [facility]: [facility];  Service: Endoscopy;  Laterality: N/A;  1:00 pm, asa 3   COLONOSCOPY WITH PROPOFOL  N/A 03/27/2018   Internal hemorrhoids, diverticulosis in sigmoid and descending colon, three 4-6 mm polyps at IC valve. tubular adenomas 5 year surveillance  ESOPHAGEAL DILATION N/A 12/06/2023   Procedure: DILATION, ESOPHAGUS;  Surgeon: Shaaron Lamar HERO, MD;  [facility]: [facility];  Service: Endoscopy;  Laterality: N/A;   ESOPHAGOGASTRODUODENOSCOPY N/A 12/06/2023    Procedure: EGD (ESOPHAGOGASTRODUODENOSCOPY);  Surgeon: Shaaron Lamar HERO, MD;  [facility]: [facility];  Service: Endoscopy;  Laterality: N/A;   ESOPHAGOGASTRODUODENOSCOPY (EGD) WITH PROPOFOL  N/A 03/27/2018   Moderate Schatzki's ring s/p dilation, small hiatal hernia   ESOPHAGOGASTRODUODENOSCOPY (EGD) WITH PROPOFOL  N/A 02/28/2022   Procedure: ESOPHAGOGASTRODUODENOSCOPY (EGD) WITH PROPOFOL ;  Surgeon: Shaaron Lamar HERO, MD;  [facility]: [facility];  Service: Endoscopy;  Laterality: N/A;  8:15am   HERNIA REPAIR  2011   Incisional and umbilical utilizing mesh   MALONEY DILATION N/A 03/27/2018   Procedure: AGAPITO DILATION;  Surgeon: Shaaron Lamar HERO, MD;  [facility]: [facility];  Service: Endoscopy;  Laterality: N/A;   MALONEY DILATION N/A 02/28/2022   Procedure: AGAPITO DILATION;  Surgeon: Shaaron Lamar HERO, MD;  [facility];  Service: Endoscopy;  Laterality: N/A;   POLYPECTOMY  03/27/2018   Procedure: POLYPECTOMY;  Surgeon: Shaaron Lamar HERO, MD;  [facility];  Service: Endoscopy;;  colon   ROTATOR CUFF REPAIR Left    TEE WITHOUT CARDIOVERSION N/A 01/17/2016   Procedure: TRANSESOPHAGEAL ECHOCARDIOGRAM (TEE);  Surgeon: Pearla DELENA Rout, MD;  Location: [facility];  Service: Cardiovascular;  Laterality: N/A;   TEE WITHOUT CARDIOVERSION N/A 02/21/2016   Procedure: TRANSESOPHAGEAL ECHOCARDIOGRAM (TEE) WITH PROPOFOL ;  Surgeon: Pearla DELENA Rout, MD;  Location: [facility];  Service: Cardiovascular;  Laterality: N/A;   TEE WITHOUT CARDIOVERSION N/A 03/20/2016   Procedure: TRANSESOPHAGEAL ECHOCARDIOGRAM (TEE) WITH PROPOFOL ;  Surgeon: Jayson KANDICE Sierras, MD;  Location: [facility];  Service: Cardiovascular;  Laterality: N/A;   TONSILLECTOMY     TOTAL KNEE ARTHROPLASTY  2022    FAMILY HISTORY Family History  Problem Relation Age of Onset   Ovarian cancer Mother 35       Secondary to ovarian cancer   Leukemia Mother    Stroke Father 23       Brain stem infarction   Breast cancer Paternal Aunt     Crohn's disease Son    Colon cancer Neg Hx      ADVANCED DIRECTIVES:   HEALTH MAINTENANCE: Social History   Tobacco Use   Smoking status: Former    Current packs/day: 0.00    Average packs/day: 0.3 packs/day for 2.0 years (0.5 ttl pk-yrs)    Types: Cigarettes    Start date: 08/02/1973    Quit date: 08/03/1975    Years since quitting: 48.6   Smokeless tobacco: Never  Vaping Use   Vaping status: Never Used  Substance Use Topics   Alcohol use: No    Alcohol/week: 0.0 standard drinks of alcohol   Drug use: No    Colonoscopy:  PAP:  Bone density:  Lipid panel:  Allergies  Allergen Reactions   Flagyl [Metronidazole] Other (See Comments)    Classic side effect of severe nausea, unable to tolerate.     Current Outpatient Medications  Medication Sig Dispense Refill   acetaminophen  (TYLENOL ) 650 MG CR tablet Take 650-1,300 mg by mouth daily as needed for pain.     amiodarone  (PACERONE ) 200 MG tablet Take 1 tablet (200 mg total) by mouth daily. 90 tablet 3   apixaban  (ELIQUIS ) 5 MG TABS tablet Take 1 tablet (5 mg total) by mouth 2 (two) times daily. 180 tablet 2   Chlorpheniramine Maleate (CHLOR-TABLETS PO) Take 2 tablets by  mouth daily as needed (allergies).     docusate sodium  (COLACE) 100 MG capsule Take 200 mg by mouth every morning.     esomeprazole  (NEXIUM ) 40 MG capsule Take one capsule 30 minutes before breakfast, you can take a second one before supper if needed. 180 capsule 1   famotidine  (PEPCID ) 20 MG tablet Take 1 tablet (20 mg total) by mouth 2 (two) times daily as needed for heartburn or indigestion. 60 tablet 0   furosemide  (LASIX ) 20 MG tablet TAKE TWO TABLETS BY MOUTH DAILY AS NEEDED FOR FLUID 180 tablet 1   HYDROcodone -acetaminophen  (NORCO) 5-325 MG tablet Take 1 tablet by mouth every 6 (six) hours as needed for severe pain (pain score 7-10). 60 tablet 0   levothyroxine  (SYNTHROID , LEVOTHROID) 125 MCG tablet Take 125 mcg by mouth daily before breakfast.      metoprolol  tartrate (LOPRESSOR ) 25 MG tablet TAKE 1 TABLET (25 MG TOTAL) BY MOUTH 2 (TWO) TIMES DAILY. 60 tablet 6   nystatin  cream (MYCOSTATIN ) Apply 1 Application topically 2 (two) times daily.     No current facility-administered medications for this visit.   Facility-Administered Medications Ordered in Other Visits  Medication Dose Route Frequency Provider Last Rate Last Admin   hydrocortisone  cream 1 % 1 application  1 application  Topical TID PRN Debera Jayson MATSU, MD        OBJECTIVE: Vitals:   03/03/24 1307  BP: (!) 116/48  Pulse: 63  Resp: 20  Temp: 98.6 F (37 C)  SpO2: 100%     Body mass index is 37.93 kg/m.    ECOG FS:2 - Symptomatic, <50% confined to bed  General: Well-developed, well-nourished, no acute distress. Eyes: Pink conjunctiva, anicteric sclera. HEENT: Normocephalic, moist mucous membranes. Lungs: No audible wheezing or coughing. Heart: Regular rate and rhythm. Abdomen: Soft, nontender, no obvious distention.  Musculoskeletal: No edema, cyanosis, or clubbing. Neuro: Alert, answering all questions appropriately. Cranial nerves grossly intact. Skin: No rashes or petechiae noted. Psych: Normal affect.  LAB RESULTS:  Lab Results  Component Value Date   NA 141 11/19/2023   K 4.0 11/19/2023   CL 106 11/19/2023   CO2 27 11/19/2023   GLUCOSE 85 11/19/2023   BUN 15 11/19/2023   CREATININE 0.83 11/19/2023   CALCIUM 9.8 11/19/2023   PROT 6.9 07/04/2023   ALBUMIN 4.1 07/04/2023   AST 18 07/04/2023   ALT 18 07/04/2023   ALKPHOS 102 07/04/2023   BILITOT 1.2 (H) 07/04/2023   GFRNONAA >60 11/19/2023   GFRAA >60 03/09/2020    Lab Results  Component Value Date   WBC 36.5 (H) 03/03/2024   NEUTROABS 3.7 03/03/2024   HGB 13.0 03/03/2024   HCT 41.0 03/03/2024   MCV 98.3 03/03/2024   PLT 104 (L) 03/03/2024     STUDIES: No results found.   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  PLAN:    CLL: Patient  completed her second round of weekly single agent Rituxan  x4 on August 20, 2019.  Her most recent CT scan on 02/26/24 is pending official read.  Her WBC is essentially stable at 36.5.  Mild thrombocytopenia which is stable.  No anemia.  No evidence of endorgan damage otherwise.  Symptomatically, concerning for progressive disease with increased abdominal pain and nausea with worsening fatigue.  She indicates today that she would consider repeating rituximab  which contradicts her previous discussions with Dr. Jacobo regarding preference for Imbruvica or zanubrutinib.  For now, we will plan to reevaluate  her symptoms and imaging in 3 months. Splenomegaly/flank pain: Chronic and unchanged.  Unchanged for several years.  Low-density lesions seen in the spleen are also unchanged.  Unlikely the etiology of her worsening abdominal pain. Patient was given a prescription for Vicodin today. Abdominal pain: Status post colonoscopy and endoscopy with GI in May 2025.  She was found to have scattered medium mouth diverticula in the sigmoid colon and descending colon.  Nonobstructing Schatzki ring was dilated on EGD, small hiatal hernia.  Otherwise normal. Atrial fibrillation: Continue monitoring and treatment per cardiology. Iron deficiency anemia: Resolved.  She last received IV Feraheme in May 2017.  Thrombocytopenia: Chronic and unchanged.  Patient most recent platelet count is 104.  Patient's known splenomegaly is likely contributing. PET positive thyroid  lesions: 1.2 cm right lobe nodule also seen on thyroid  ultrasound April 06, 2016.  No change on recent CT scan.  Continue follow-up with ENT as indicated.  Disposition: 3 mo- ct neck/check/abdomen/pelvis 1 week later- labs, Dr Jacobo- la   I spent a total of 30 minutes reviewing chart data, face-to-face evaluation with the patient, counseling and coordination of care as detailed above.  Patient expressed understanding and was in agreement with this plan.  She also understands that She can call clinic at any time with any questions, concerns, or complaints.   Tinnie KANDICE Dawn, NP   03/03/2024

## 2024-03-03 NOTE — Addendum Note (Signed)
 Addended by: Parisha Beaulac A on: 03/03/2024 03:20 PM   Modules accepted: Orders

## 2024-03-04 ENCOUNTER — Other Ambulatory Visit: Payer: Self-pay | Admitting: Cardiology

## 2024-03-05 ENCOUNTER — Encounter (HOSPITAL_COMMUNITY): Payer: Self-pay

## 2024-03-05 ENCOUNTER — Telehealth: Payer: Self-pay | Admitting: *Deleted

## 2024-03-05 NOTE — Telephone Encounter (Signed)
 Per the patient the nurse practitioner was going to give the results of the labs and she has not heard back yet.  She was here on 8 /5/25

## 2024-03-05 NOTE — Telephone Encounter (Signed)
 Called informed patient per NP labs/scans are stable no further treatment at this time to fl/u with apts in Nov as scheduled. Patient verbalizes understanding

## 2024-03-09 ENCOUNTER — Telehealth: Payer: Self-pay | Admitting: Cardiology

## 2024-03-09 NOTE — Telephone Encounter (Signed)
 Per Dr. Debera Response:  I would say that a short course of steroids is not contraindicated with her history of paroxysmal atrial fibrillation. Whether she is willing to take it depends largely on prior experience with similar drugs and side effects. She could have a temporary fluctuation in heart rate and blood pressure.  Unable to leave voicemail. Sent patient MyChart message as well.

## 2024-03-09 NOTE — Telephone Encounter (Signed)
 Pt c/o medication issue:  1. Name of Medication: Prednisone 4mg  for 6 days  2. How are you currently taking this medication (dosage and times per day)?   3. Are you having a reaction (difficulty breathing--STAT)? No   4. What is your medication issue? Pt called in stating she is having some issue with her teeth and they told her to take this medication but she states in the past this medication caused an issue with her HR. Please advise if this is safe to take.

## 2024-04-18 ENCOUNTER — Encounter (HOSPITAL_COMMUNITY): Payer: Self-pay

## 2024-04-18 ENCOUNTER — Emergency Department (HOSPITAL_COMMUNITY)
Admission: EM | Admit: 2024-04-18 | Discharge: 2024-04-18 | Disposition: A | Discharge status: Home / Self Care | Attending: Emergency Medicine | Admitting: Emergency Medicine

## 2024-04-18 ENCOUNTER — Other Ambulatory Visit: Payer: Self-pay

## 2024-04-18 ENCOUNTER — Telehealth: Payer: Self-pay | Admitting: Physician Assistant

## 2024-04-18 ENCOUNTER — Emergency Department (HOSPITAL_COMMUNITY)

## 2024-04-18 DIAGNOSIS — D72829 Elevated white blood cell count, unspecified: Secondary | ICD-10-CM | POA: Insufficient documentation

## 2024-04-18 DIAGNOSIS — Z7901 Long term (current) use of anticoagulants: Secondary | ICD-10-CM | POA: Diagnosis not present

## 2024-04-18 DIAGNOSIS — I480 Paroxysmal atrial fibrillation: Secondary | ICD-10-CM | POA: Insufficient documentation

## 2024-04-18 DIAGNOSIS — I4891 Unspecified atrial fibrillation: Secondary | ICD-10-CM | POA: Diagnosis present

## 2024-04-18 LAB — CBC WITH DIFFERENTIAL/PLATELET
Basophils Absolute: 0 K/uL (ref 0.0–0.1)
Basophils Relative: 0 %
Eosinophils Absolute: 0.8 K/uL — ABNORMAL HIGH (ref 0.0–0.5)
Eosinophils Relative: 2 %
HCT: 46.5 % — ABNORMAL HIGH (ref 36.0–46.0)
Hemoglobin: 14.5 g/dL (ref 12.0–15.0)
Lymphocytes Relative: 90 %
Lymphs Abs: 38 K/uL — ABNORMAL HIGH (ref 0.7–4.0)
MCH: 31.3 pg (ref 26.0–34.0)
MCHC: 31.2 g/dL (ref 30.0–36.0)
MCV: 100.2 fL — ABNORMAL HIGH (ref 80.0–100.0)
Monocytes Absolute: 0 K/uL — ABNORMAL LOW (ref 0.1–1.0)
Monocytes Relative: 0 %
Neutro Abs: 3.4 K/uL (ref 1.7–7.7)
Neutrophils Relative %: 8 %
Platelets: 114 K/uL — ABNORMAL LOW (ref 150–400)
RBC: 4.64 MIL/uL (ref 3.87–5.11)
RDW: 14.7 % (ref 11.5–15.5)
Smear Review: NORMAL
WBC: 42.2 K/uL — ABNORMAL HIGH (ref 4.0–10.5)
nRBC: 0 % (ref 0.0–0.2)

## 2024-04-18 LAB — BASIC METABOLIC PANEL WITH GFR
Anion gap: 13 (ref 5–15)
BUN: 18 mg/dL (ref 8–23)
CO2: 23 mmol/L (ref 22–32)
Calcium: 10 mg/dL (ref 8.9–10.3)
Chloride: 107 mmol/L (ref 98–111)
Creatinine, Ser: 0.92 mg/dL (ref 0.44–1.00)
GFR, Estimated: >60 mL/min (ref >60)
Glucose, Bld: 99 mg/dL (ref 70–99)
Potassium: 4.2 mmol/L (ref 3.5–5.1)
Sodium: 143 mmol/L (ref 135–145)

## 2024-04-18 LAB — MAGNESIUM: Magnesium: 2.2 mg/dL (ref 1.7–2.4)

## 2024-04-18 MED ORDER — PROPOFOL 10 MG/ML IV BOLUS
0.5000 mg/kg | Freq: Once | INTRAVENOUS | Status: AC
  Administered 2024-04-18: 54 mg via INTRAVENOUS
  Filled 2024-04-18: qty 20

## 2024-04-18 MED ORDER — SODIUM CHLORIDE 0.9 % IV BOLUS
1000.0000 mL | Freq: Once | INTRAVENOUS | Status: AC
  Administered 2024-04-18: 1000 mL via INTRAVENOUS

## 2024-04-18 MED ORDER — METOPROLOL TARTRATE 5 MG/5ML IV SOLN
2.5000 mg | Freq: Once | INTRAVENOUS | Status: AC
  Administered 2024-04-18: 2.5 mg via INTRAVENOUS
  Filled 2024-04-18: qty 5

## 2024-04-18 MED ORDER — ONDANSETRON HCL 4 MG/2ML IJ SOLN
4.0000 mg | Freq: Once | INTRAMUSCULAR | Status: AC
  Administered 2024-04-18: 4 mg via INTRAVENOUS
  Filled 2024-04-18: qty 2

## 2024-04-18 NOTE — ED Notes (Signed)
 Pt ambulated with little to no help O2 stayed in the 90s, pt stated that she wasn't dizzy nor short of breath.

## 2024-04-18 NOTE — Discharge Instructions (Signed)
 You were seen in the emergency department for feeling some chest discomfort.  You were back in atrial fibrillation.  You were cardioverted with improvement in your rhythm.  Please continue your regular medications and rest.  Follow-up with your cardiologist.  Return to the emergency department if any worsening symptoms

## 2024-04-18 NOTE — Telephone Encounter (Signed)
 Patient called answering service this morning reporting symptomatic Afib that developed last night. Reports rates are all over the place. Has taken amiodarone  and metoprolol  without improvement in rates or symptoms of tachy-palpitations. No symptoms of angina. She asks if she should go to the ER. Unable to evaluate her further over the phone with recommendation for patient to go to the ED for formal evaluation and management.

## 2024-04-18 NOTE — ED Triage Notes (Signed)
 Pt stated that she converted to Afib around 0100 last night and her heart rate has been fast. Pt has hx of Afib but medications are not fixing the problem.

## 2024-04-18 NOTE — ED Notes (Addendum)
 Cardioversion started

## 2024-04-18 NOTE — ED Provider Notes (Signed)
 [facility] AT [facility] Provider Note   CSN: [id number] Arrival date & time: 04/18/24  1556     Patient presents with: Atrial Fibrillation   Daisy Black is a 73 y.o. female.  She has a history of atrial fibrillation and has been compliant with her amiodarone  and apixaban , Lopressor .  She said she felt she flipped into A-fib around 1 AM this morning.  She just has an uncomfortable feeling in her chest but no real pain.  No dizziness or shortness of breath.  She is here for further evaluation of same.  It sounds like she is normally in sinus rhythm.  No recent illness fevers chills nausea vomiting, no urinary symptoms.  Has been eating and drinking well.  {Add pertinent medical, surgical, social history, OB history to YEP:67052} The history is provided by the patient.  Atrial Fibrillation This is a recurrent problem. The current episode started 12 to 24 hours ago. The problem occurs constantly. The problem has not changed since onset.Associated symptoms include chest pain. Pertinent negatives include no abdominal pain, no headaches and no shortness of breath. Nothing aggravates the symptoms. Nothing relieves the symptoms. She has tried rest for the symptoms. The treatment provided no relief.       Prior to Admission medications   Medication Sig Start Date End Date Taking? Authorizing Provider  acetaminophen  (TYLENOL ) 650 MG CR tablet Take 650-1,300 mg by mouth daily as needed for pain.    [provider]  amiodarone  (PACERONE ) 200 MG tablet Take 1 tablet (200 mg total) by mouth daily. 01/02/24   Debera Jayson MATSU, MD  apixaban  (ELIQUIS ) 5 MG TABS tablet Take 1 tablet (5 mg total) by mouth 2 (two) times daily. 11/27/23   Debera Jayson MATSU, MD  Chlorpheniramine Maleate (CHLOR-TABLETS PO) Take 2 tablets by mouth daily as needed (allergies).    [provider]  docusate sodium  (COLACE) 100 MG capsule Take 200 mg by mouth every morning.     [provider]  esomeprazole  (NEXIUM ) 40 MG capsule Take one capsule 30 minutes before breakfast, you can take a second one before supper if needed. 12/24/23   Ezzard Sonny RAMAN, PA-C  famotidine  (PEPCID ) 20 MG tablet Take 1 tablet (20 mg total) by mouth 2 (two) times daily as needed for heartburn or indigestion. 09/23/23   Ezzard Sonny RAMAN, PA-C  furosemide  (LASIX ) 20 MG tablet TAKE TWO TABLETS BY MOUTH DAILY AS NEEDED FOR FLUID 03/04/24   Debera Jayson MATSU, MD  HYDROcodone -acetaminophen  (NORCO) 5-325 MG tablet Take 1 tablet by mouth every 6 (six) hours as needed for severe pain (pain score 7-10). 03/03/24   Dasie Tinnie MATSU, NP  levothyroxine  (SYNTHROID , LEVOTHROID) 125 MCG tablet Take 125 mcg by mouth daily before breakfast.    [provider]  metoprolol  tartrate (LOPRESSOR ) 25 MG tablet TAKE 1 TABLET (25 MG TOTAL) BY MOUTH 2 (TWO) TIMES DAILY. 12/03/23   Debera Jayson MATSU, MD  nystatin  cream (MYCOSTATIN ) Apply 1 Application topically 2 (two) times daily. 08/12/23   [provider]  prochlorperazine  (COMPAZINE ) 10 MG tablet Take 1 tablet (10 mg total) by mouth every 6 (six) hours as needed for nausea or vomiting. 03/03/24   Dasie Tinnie MATSU, NP    Allergies: Flagyl [metronidazole]    Review of Systems  Constitutional:  Negative for fever.  HENT:  Negative for sore throat.   Respiratory:  Negative for shortness of breath.   Cardiovascular:  Positive for chest pain.  Gastrointestinal:  Negative for abdominal pain.  Genitourinary:  Negative for dysuria.  Skin:  Negative for rash.  Neurological:  Negative for headaches.    Updated Vital Signs BP 128/66 (BP Location: Right Arm)   Pulse (!) 105   Temp (!) 97.5 F (36.4 C) (Oral)   Resp 18   Ht 5' 7 (1.702 m)   Wt 108 kg   SpO2 96%   BMI 37.28 kg/m   Physical Exam Vitals and nursing note reviewed.  Constitutional:      General: She is not in acute distress.    Appearance: Normal appearance. She is well-developed.   HENT:     Head: Normocephalic and atraumatic.  Eyes:     Conjunctiva/sclera: Conjunctivae normal.  Cardiovascular:     Rate and Rhythm: Tachycardia present. Rhythm irregular.     Heart sounds: No murmur heard. Pulmonary:     Effort: Pulmonary effort is normal. No respiratory distress.     Breath sounds: Normal breath sounds. No stridor. No wheezing.  Abdominal:     Palpations: Abdomen is soft.     Tenderness: There is no abdominal tenderness. There is no guarding or rebound.  Musculoskeletal:        General: No tenderness or deformity. Normal range of motion.     Cervical back: Neck supple.  Skin:    General: Skin is warm and dry.  Neurological:     General: No focal deficit present.     Mental Status: She is alert.     GCS: GCS eye subscore is 4. GCS verbal subscore is 5. GCS motor subscore is 6.     (all labs ordered are listed, but only abnormal results are displayed) Labs Reviewed  CBC WITH DIFFERENTIAL/PLATELET  BASIC METABOLIC PANEL WITH GFR  MAGNESIUM     EKG: None  Radiology: No results found.  {Document cardiac monitor, telemetry assessment procedure when appropriate:32947} .Sedation  Date/Time: 04/18/2024 6:41 PM  Performed by: Towana Ozell BROCKS, MD Authorized by: Towana Ozell BROCKS, MD   Consent:    Consent obtained:  Written   Consent given by:  Patient   Risks discussed:  Dysrhythmia, inadequate sedation, nausea, vomiting, respiratory compromise necessitating ventilatory assistance and intubation and prolonged hypoxia resulting in organ damage   Alternatives discussed:  Analgesia without sedation Universal protocol:    Procedure explained and questions answered to patient or proxy's satisfaction: yes     Immediately prior to procedure, a time out was called: yes     Patient identity confirmed:  Arm band Indications:    Procedure necessitating sedation performed by:  Physician performing sedation Pre-sedation assessment:    Time since last food or  drink:  4   ASA classification: class 3 - patient with severe systemic disease     Mouth opening:  3 or more finger widths   Mallampati score:  II - soft palate, uvula, fauces visible   Neck mobility: normal     Pre-sedation assessments completed and reviewed: airway patency, cardiovascular function, hydration status, mental status, nausea/vomiting, pain level, respiratory function and temperature   A pre-sedation assessment was completed prior to the start of the procedure Immediate pre-procedure details:    Reassessment: Patient reassessed immediately prior to procedure     Reviewed: vital signs, relevant labs/tests and NPO status     Verified: bag valve mask available, emergency equipment available, intubation equipment available, IV patency confirmed, oxygen available and suction available   Procedure details (see MAR for exact dosages):    Preoxygenation:  Nonrebreather mask   Sedation:  Propofol    Intended level of sedation: deep   Intra-procedure monitoring:  Blood pressure monitoring, cardiac monitor, continuous pulse oximetry, continuous capnometry, frequent LOC assessments and frequent vital sign checks   Intra-procedure events: none     Total Provider sedation time (minutes):  15 Post-procedure details:   A post-sedation assessment was completed following the completion of the procedure.   Attendance: Constant attendance by certified staff until patient recovered     Recovery: Patient returned to pre-procedure baseline     Post-sedation assessments completed and reviewed: airway patency, cardiovascular function, hydration status, mental status, nausea/vomiting, pain level, respiratory function and temperature     Patient is stable for discharge or admission: yes     Procedure completion:  Tolerated well, no immediate complications .Cardioversion  Date/Time: 04/18/2024 6:41 PM  Performed by: Towana Ozell BROCKS, MD Authorized by: Towana Ozell BROCKS, MD   Consent:    Consent  obtained:  Written   Consent given by:  Patient   Risks discussed:  Cutaneous burn, death, induced arrhythmia and pain   Alternatives discussed:  Referral, rate-control medication and delayed treatment Pre-procedure details:    Cardioversion basis:  Elective   Rhythm:  Atrial fibrillation   Electrode placement:  Anterior-posterior Patient sedated: Yes. Refer to sedation procedure documentation for details of sedation.  Attempt one:    Cardioversion mode:  Synchronous   Shock (Joules):  120   Shock outcome:  No change in rhythm Attempt two:    Cardioversion mode:  Synchronous   Shock (Joules):  150   Shock outcome:  Conversion to normal sinus rhythm Post-procedure details:    Patient status:  Alert   Patient tolerance of procedure:  Tolerated well, no immediate complications    Medications Ordered in the ED - No data to display  Clinical Course as of 04/18/24 1840  Sat Apr 18, 2024  1651 Chest x-ray does not show any acute infiltrate.  Awaiting radiology reading. [MB]  1719 Patient has remained in A-fib although heart rate is in the 90s.  I talked to her about cardioversion and she said she has been uncomfortable and sick at all last night due to her symptoms. [MB]  8187 Reviewed with cardiology Dr. Duffy.  She does not see any contraindications to proceeding with cardioversion. [MB]    Clinical Course User Index [MB] Towana Ozell BROCKS, MD   {Click here for ABCD2, HEART and other calculators REFRESH Note before signing:1}                              Medical Decision Making Amount and/or Complexity of Data Reviewed Labs: ordered. Radiology: ordered.  Risk Prescription drug management.   This patient complains of ***; this involves an extensive number of treatment Options and is a complaint that carries with it a high risk of complications and morbidity. The differential includes ***  I ordered, reviewed and interpreted labs, which included *** I ordered medication ***  and reviewed PMP when indicated. I ordered imaging studies which included *** and I independently    visualized and interpreted imaging which showed *** Additional history obtained from *** Previous records obtained and reviewed *** I consulted *** and discussed lab and imaging findings and discussed disposition.  Cardiac monitoring reviewed, *** Social determinants considered, *** Critical Interventions: ***  After the interventions stated above, I reevaluated the patient and found *** Admission and further testing considered, ***   {  Document critical care time when appropriate  Document review of labs and clinical decision tools ie [id number], etc  Document your independent review of radiology images and any outside records  Document your discussion with family members, caretakers and with consultants  Document social determinants of health affecting pt's care  Document your decision making why or why not admission, treatments were needed:32947:::1}   Final diagnoses:  None    ED Discharge Orders     None

## 2024-04-20 LAB — PATHOLOGIST SMEAR REVIEW

## 2024-04-28 ENCOUNTER — Ambulatory Visit: Admitting: Gastroenterology

## 2024-05-29 ENCOUNTER — Telehealth: Payer: Self-pay | Admitting: Oncology

## 2024-05-29 NOTE — Telephone Encounter (Signed)
 Patient called and would like to reschedule her upcoming appointments on 11/5 for CT and 11/12 Lab/MD to February. She said she is not having the issue any longer and just wants to push the appointments out. Number listed in the chart is correct for call back.

## 2024-06-03 ENCOUNTER — Ambulatory Visit (HOSPITAL_COMMUNITY): Admission: RE | Admit: 2024-06-03 | Source: Ambulatory Visit

## 2024-06-04 ENCOUNTER — Encounter: Payer: Self-pay | Admitting: Nurse Practitioner

## 2024-06-10 ENCOUNTER — Other Ambulatory Visit

## 2024-06-10 ENCOUNTER — Encounter: Payer: Self-pay | Admitting: Gastroenterology

## 2024-06-10 ENCOUNTER — Ambulatory Visit: Admitting: Gastroenterology

## 2024-06-10 ENCOUNTER — Ambulatory Visit: Admitting: Oncology

## 2024-06-10 VITALS — BP 135/76 | HR 81 | Temp 97.9°F | Ht 67.0 in | Wt 248.0 lb

## 2024-06-10 DIAGNOSIS — K5792 Diverticulitis of intestine, part unspecified, without perforation or abscess without bleeding: Secondary | ICD-10-CM

## 2024-06-10 DIAGNOSIS — K219 Gastro-esophageal reflux disease without esophagitis: Secondary | ICD-10-CM

## 2024-06-10 MED ORDER — AMOXICILLIN-POT CLAVULANATE 875-125 MG PO TABS: 1.0000 | ORAL_TABLET | Freq: Two times a day (BID) | ORAL | 0 refills | Status: AC

## 2024-06-10 MED ORDER — ONDANSETRON HCL 4 MG PO TABS: 4.0000 mg | ORAL_TABLET | Freq: Four times a day (QID) | ORAL | 1 refills | Status: AC | PRN

## 2024-06-10 NOTE — Patient Instructions (Addendum)
 Continue esomeprazole  40mg  once to twice daily for acid reflux and upper abdominal discomfort. If you need to take twice daily for relief, continue twice daily for now.   Augmentin  one tablet twice daily for 7-10 days.    Consume soft low fiber diet until you are feeling better.   Maintain regular bowel movements. If needed you can use miralax  one capful daily as needed.   Call if you are not feeling better in the next couple of days.

## 2024-06-10 NOTE — Progress Notes (Signed)
 GI Office Note    Referring Provider: Myra Geni ORN, FNP Primary Care Physician:  Myra Geni ORN, FNP  Primary Gastroenterologist: Ozell Hollingshead, MD   Chief Complaint   Chief Complaint  Patient presents with   Follow-up    Here for follow up and also believes she is having a diverticulitis flare    History of Present Illness     Discussed the use of AI scribe software for clinical note transcription with the patient, who gave verbal consent to proceed.  History of Present Illness  73 year old female presenting for follow-up.  Last seen May 2025.She has a history of diverticulitis, epigastric pain, LUQ pain, GERD, anemia, asthma, hyperlipidemia, hypertension, A-fib on Eliquis , CLL following at [facility].    Was due for follow up ov coming up but due to abdominal pain she came in today. Complains of four day history of upper abdominal pain similar to her episodes of diverticulitis. Hurts to eat. Transitioned to soft/liquid diet. She notes she had been doing ok with esomeprazole  40mg  once daily before breakfast and taking dose before supper only when needed (about 3 times per week). With this episode, she tried taking BID first to see if it would help but has not made a difference. No vomiting but has nausea. No typical heartburn. BMs daily, unchanged. [place] four. No melena, brbpr. Her son is stage 4 lymphoma and currently admitted at [facility] with aspiration pneumonia. She has not been able to visit. She talks to him regularly.    Prior Data    CT chest abdomen pelvis with contrast February 26, 2024: IMPRESSION: 1. Stable lymphadenopathy in the chest, abdomen and pelvis. 2. Stable splenomegaly. 3. Colonic diverticulosis without findings of acute diverticulitis. 4. Colonic stool burden compatible with constipation.   CT A/P with contrast 07/2023: IMPRESSION: Previous wall thickening and stranding along the proximal sigmoid colon has improved. No new areas  identified. Scattered colonic diverticula particularly of the sigmoid colon with circular muscle hypertrophy. No obstruction. Air-fluid levels seen along the nondilated colon.   Again evidence of splenomegaly with subtle splenic lesion and numerous nodes, similar to previous examination. Please correlate with the history of CLL.   EGD 11/2023: -nonobstructing schatzki ring s/p dilation -small hh -normal duodenal bulb and second portion of duodenum   Colonoscopy 11/2023: -diverticulosis in sigmoid and descending colon   Colonoscopy in August 2019 with internal hemorrhoids, sigmoid and descending diverticulosis, and 3 polyps at the ileocecal valve.  Due for surveillance in 2024.   EGD August 2023: -Mild Schatzki's ring s/p dilation -Gastric erosions of uncertain significance s/p biopsy revealing antral mucosa with focal erosion and reactive changes, no H. Pylori -Normal duodenum -Reported lansoprazole  not helping as much as pantoprazole  (which also was not controlling her reflux symptoms).    Medications   Current Outpatient Medications  Medication Sig Dispense Refill   acetaminophen  (TYLENOL ) 650 MG CR tablet Take 650-1,300 mg by mouth daily as needed for pain.     amiodarone  (PACERONE ) 200 MG tablet Take 1 tablet (200 mg total) by mouth daily. 90 tablet 3   apixaban  (ELIQUIS ) 5 MG TABS tablet Take 1 tablet (5 mg total) by mouth 2 (two) times daily. 180 tablet 2   Chlorpheniramine Maleate (CHLOR-TABLETS PO) Take 2 tablets by mouth daily as needed (allergies).     docusate sodium  (COLACE) 100 MG capsule Take 200 mg by mouth every morning.     esomeprazole  (NEXIUM ) 40 MG capsule Take one capsule 30 minutes  before breakfast, you can take a second one before supper if needed. 180 capsule 1   famotidine  (PEPCID ) 20 MG tablet Take 1 tablet (20 mg total) by mouth 2 (two) times daily as needed for heartburn or indigestion. 60 tablet 0   furosemide  (LASIX ) 20 MG tablet TAKE TWO TABLETS BY  MOUTH DAILY AS NEEDED FOR FLUID 90 tablet 3   HYDROcodone -acetaminophen  (NORCO) 5-325 MG tablet Take 1 tablet by mouth every 6 (six) hours as needed for severe pain (pain score 7-10). 120 tablet 0   levothyroxine  (SYNTHROID , LEVOTHROID) 125 MCG tablet Take 125 mcg by mouth daily before breakfast.     metoprolol  tartrate (LOPRESSOR ) 25 MG tablet TAKE 1 TABLET (25 MG TOTAL) BY MOUTH 2 (TWO) TIMES DAILY. 60 tablet 6   nystatin  cream (MYCOSTATIN ) Apply 1 Application topically 2 (two) times daily.     prochlorperazine  (COMPAZINE ) 10 MG tablet Take 1 tablet (10 mg total) by mouth every 6 (six) hours as needed for nausea or vomiting. 120 tablet 0   No current facility-administered medications for this visit.   Facility-Administered Medications Ordered in Other Visits  Medication Dose Route Frequency Provider Last Rate Last Admin   hydrocortisone  cream 1 % 1 application  1 application  Topical TID PRN Debera Jayson MATSU, MD        Allergies   Allergies as of 06/10/2024 - Review Complete 06/10/2024  Allergen Reaction Noted   Flagyl [metronidazole] Other (See Comments) 05/01/2019    Review of Systems   General: Negative for  weight loss, fever, chills, fatigue, weakness. See hpi ENT: Negative for hoarseness, difficulty swallowing , nasal congestion. CV: Negative for chest pain, angina, palpitations, dyspnea on exertion, +peripheral edema.  Respiratory: Negative for dyspnea at rest, dyspnea on exertion, cough, sputum, wheezing.  GI: See history of present illness. GU:  Negative for dysuria, hematuria, urinary incontinence, urinary frequency, nocturnal urination.  Endo: Negative for unusual weight change.     Physical Exam   BP 135/76   Pulse 81   Temp 97.9 F (36.6 C) (Oral)   Ht 5' 7 (1.702 m)   Wt 248 lb (112.5 kg)   SpO2 98%   BMI 38.84 kg/m    General: Well-nourished, well-developed in no acute distress.  Eyes: No icterus. Mouth: Oropharyngeal mucosa moist and pink   Lungs:  Clear to auscultation bilaterally.  Heart: Regular rate and rhythm, no murmurs rubs or gallops.  Abdomen: Bowel sounds are normal,  nondistended, no hepatosplenomegaly or masses,  no abdominal bruits or hernia , no rebound or guarding. Moderate epigastric tenderness Rectal: not performed Extremities: No lower extremity edema. No clubbing or deformities. Neuro: Alert and oriented x 4   Skin: Warm and dry, no jaundice.   Psych: Alert and cooperative, normal mood and affect.  Labs   Lab Results  Component Value Date   NA 143 04/18/2024   CL 107 04/18/2024   K 4.2 04/18/2024   CO2 23 04/18/2024   BUN 18 04/18/2024   CREATININE 0.92 04/18/2024   GFRNONAA >60 04/18/2024   CALCIUM 10.0 04/18/2024   PHOS 3.3 11/22/2021   ALBUMIN 4.0 03/03/2024   GLUCOSE 99 04/18/2024   Lab Results  Component Value Date   ALT 18 03/03/2024   AST 19 03/03/2024   ALKPHOS 90 03/03/2024   BILITOT 0.7 03/03/2024   Lab Results  Component Value Date   WBC 42.2 (H) 04/18/2024   HGB 14.5 04/18/2024   HCT 46.5 (H) 04/18/2024   MCV 100.2 (H)  04/18/2024   PLT 114 (L) 04/18/2024    Imaging Studies   No results found.  Assessment/Plan:    GERD: doing well, mostly taking esomeprazole  once daily, but takes second dose about three times per week. -continue esomeprazole  once to twice daily before breakfast and supper -reinforced anti-reflux measures -return office visit in six months.   Epigastric pain/LUQ pain: acute on chronic symptoms. She has had diverticulitis several times, documented by CT earlier this year. She feels these symptoms are reminiscent of her diverticulitis attacks previously. She has not seen any benefit with increasing her esomeprazole  to bid recently.  -empirically treat for diverticulitis, if no improvement, she will need further work up -augmentin  875/125mg  bid for 10 days -full liquids/soft low fiber diet until she is feeling better -zofran  4mg  every 6 hours as needed -call if  no improvement in symptoms in next few days.         Sonny RAMAN. Ezzard, MHS, PA-C [facility]

## 2024-06-17 ENCOUNTER — Ambulatory Visit: Admitting: Gastroenterology

## 2024-07-01 ENCOUNTER — Other Ambulatory Visit: Payer: Self-pay | Admitting: Cardiology

## 2024-09-14 ENCOUNTER — Ambulatory Visit (HOSPITAL_COMMUNITY)

## 2024-09-21 ENCOUNTER — Inpatient Hospital Stay

## 2024-09-21 ENCOUNTER — Inpatient Hospital Stay: Admitting: Oncology
